# Patient Record
Sex: Female | Born: 1950 | Race: Black or African American | Hispanic: No | Marital: Single | Smoking: Never smoker
Health system: Southern US, Community
[De-identification: ages and names within clinical notes are randomized; demographics above are authoritative.]

## PROBLEM LIST (undated history)

## (undated) DIAGNOSIS — I509 Heart failure, unspecified: Secondary | ICD-10-CM

## (undated) DIAGNOSIS — E119 Type 2 diabetes mellitus without complications: Secondary | ICD-10-CM

## (undated) DIAGNOSIS — I4891 Unspecified atrial fibrillation: Secondary | ICD-10-CM

## (undated) DIAGNOSIS — I10 Essential (primary) hypertension: Secondary | ICD-10-CM

## (undated) DIAGNOSIS — I639 Cerebral infarction, unspecified: Secondary | ICD-10-CM

## (undated) DIAGNOSIS — F419 Anxiety disorder, unspecified: Secondary | ICD-10-CM

## (undated) DIAGNOSIS — K219 Gastro-esophageal reflux disease without esophagitis: Secondary | ICD-10-CM

## (undated) DIAGNOSIS — G4730 Sleep apnea, unspecified: Secondary | ICD-10-CM

## (undated) HISTORY — DX: Essential (primary) hypertension: I10

## (undated) HISTORY — DX: Anxiety disorder, unspecified: F41.9

## (undated) HISTORY — PX: OTHER SURGICAL HISTORY: SHX169

## (undated) HISTORY — DX: Sleep apnea, unspecified: G47.30

## (undated) HISTORY — PX: PARTIAL HYSTERECTOMY: SHX80

## (undated) HISTORY — DX: Type 2 diabetes mellitus without complications: E11.9

## (undated) HISTORY — DX: Gastro-esophageal reflux disease without esophagitis: K21.9

## (undated) HISTORY — DX: Cerebral infarction, unspecified: I63.9

---

## 2004-06-26 ENCOUNTER — Ambulatory Visit: Payer: Self-pay | Admitting: Family Medicine

## 2004-07-16 ENCOUNTER — Ambulatory Visit: Payer: Self-pay | Admitting: Family Medicine

## 2004-07-16 ENCOUNTER — Encounter: Admission: RE | Admit: 2004-07-16 | Discharge: 2004-07-16 | Payer: Self-pay | Admitting: Sports Medicine

## 2004-09-16 ENCOUNTER — Ambulatory Visit: Payer: Self-pay | Admitting: Family Medicine

## 2005-04-17 ENCOUNTER — Emergency Department (HOSPITAL_COMMUNITY): Admission: EM | Admit: 2005-04-17 | Discharge: 2005-04-17 | Payer: Self-pay | Admitting: *Deleted

## 2005-05-30 ENCOUNTER — Emergency Department (HOSPITAL_COMMUNITY): Admission: EM | Admit: 2005-05-30 | Discharge: 2005-05-30 | Payer: Self-pay | Admitting: Emergency Medicine

## 2005-08-02 ENCOUNTER — Ambulatory Visit: Payer: Self-pay | Admitting: Family Medicine

## 2005-10-20 ENCOUNTER — Emergency Department: Payer: Self-pay | Admitting: Emergency Medicine

## 2005-10-20 ENCOUNTER — Other Ambulatory Visit: Payer: Self-pay

## 2006-11-03 ENCOUNTER — Ambulatory Visit: Payer: Self-pay | Admitting: Family Medicine

## 2006-11-24 DIAGNOSIS — F4310 Post-traumatic stress disorder, unspecified: Secondary | ICD-10-CM | POA: Insufficient documentation

## 2006-11-24 DIAGNOSIS — J309 Allergic rhinitis, unspecified: Secondary | ICD-10-CM | POA: Insufficient documentation

## 2006-11-24 DIAGNOSIS — F329 Major depressive disorder, single episode, unspecified: Secondary | ICD-10-CM | POA: Insufficient documentation

## 2006-11-24 DIAGNOSIS — G4730 Sleep apnea, unspecified: Secondary | ICD-10-CM | POA: Insufficient documentation

## 2006-11-24 DIAGNOSIS — M1990 Unspecified osteoarthritis, unspecified site: Secondary | ICD-10-CM | POA: Insufficient documentation

## 2006-11-24 DIAGNOSIS — I10 Essential (primary) hypertension: Secondary | ICD-10-CM | POA: Insufficient documentation

## 2006-11-24 DIAGNOSIS — E785 Hyperlipidemia, unspecified: Secondary | ICD-10-CM | POA: Insufficient documentation

## 2006-11-24 DIAGNOSIS — E119 Type 2 diabetes mellitus without complications: Secondary | ICD-10-CM | POA: Insufficient documentation

## 2006-11-24 DIAGNOSIS — E669 Obesity, unspecified: Secondary | ICD-10-CM | POA: Insufficient documentation

## 2006-11-24 DIAGNOSIS — K219 Gastro-esophageal reflux disease without esophagitis: Secondary | ICD-10-CM | POA: Insufficient documentation

## 2007-01-11 ENCOUNTER — Ambulatory Visit: Payer: Self-pay | Admitting: Cardiology

## 2008-05-12 ENCOUNTER — Ambulatory Visit: Payer: Self-pay | Admitting: Family Medicine

## 2011-11-26 DEATH — deceased

## 2015-03-06 ENCOUNTER — Ambulatory Visit
Admission: RE | Admit: 2015-03-06 | Discharge: 2015-03-06 | Disposition: A | Discharge status: Home / Self Care | Payer: Medicare Other | Source: Ambulatory Visit | Attending: Primary Care | Admitting: Primary Care

## 2015-03-06 ENCOUNTER — Other Ambulatory Visit: Payer: Self-pay | Admitting: Primary Care

## 2015-03-06 DIAGNOSIS — J189 Pneumonia, unspecified organism: Secondary | ICD-10-CM

## 2015-03-06 DIAGNOSIS — I517 Cardiomegaly: Secondary | ICD-10-CM | POA: Diagnosis not present

## 2015-03-06 DIAGNOSIS — Z8701 Personal history of pneumonia (recurrent): Secondary | ICD-10-CM | POA: Diagnosis present

## 2016-02-10 DIAGNOSIS — Z8739 Personal history of other diseases of the musculoskeletal system and connective tissue: Secondary | ICD-10-CM | POA: Insufficient documentation

## 2016-02-10 DIAGNOSIS — Z794 Long term (current) use of insulin: Secondary | ICD-10-CM

## 2016-02-10 DIAGNOSIS — E119 Type 2 diabetes mellitus without complications: Secondary | ICD-10-CM | POA: Insufficient documentation

## 2016-02-10 DIAGNOSIS — Z8639 Personal history of other endocrine, nutritional and metabolic disease: Secondary | ICD-10-CM | POA: Insufficient documentation

## 2016-02-10 DIAGNOSIS — G8929 Other chronic pain: Secondary | ICD-10-CM | POA: Insufficient documentation

## 2016-02-10 DIAGNOSIS — M545 Low back pain, unspecified: Secondary | ICD-10-CM | POA: Insufficient documentation

## 2016-04-21 DIAGNOSIS — M75120 Complete rotator cuff tear or rupture of unspecified shoulder, not specified as traumatic: Secondary | ICD-10-CM | POA: Insufficient documentation

## 2017-10-25 DIAGNOSIS — Z5320 Procedure and treatment not carried out because of patient's decision for unspecified reasons: Secondary | ICD-10-CM | POA: Insufficient documentation

## 2017-11-28 ENCOUNTER — Other Ambulatory Visit: Payer: Self-pay | Admitting: Family Medicine

## 2017-11-28 ENCOUNTER — Ambulatory Visit
Admission: RE | Admit: 2017-11-28 | Discharge: 2017-11-28 | Disposition: A | Discharge status: Home / Self Care | Payer: Medicare Other | Source: Ambulatory Visit | Attending: Family Medicine | Admitting: Family Medicine

## 2017-11-28 DIAGNOSIS — R053 Chronic cough: Secondary | ICD-10-CM

## 2017-11-28 DIAGNOSIS — J90 Pleural effusion, not elsewhere classified: Secondary | ICD-10-CM | POA: Diagnosis not present

## 2017-11-28 DIAGNOSIS — R05 Cough: Secondary | ICD-10-CM

## 2017-11-28 DIAGNOSIS — R0600 Dyspnea, unspecified: Secondary | ICD-10-CM | POA: Diagnosis not present

## 2017-11-28 DIAGNOSIS — R918 Other nonspecific abnormal finding of lung field: Secondary | ICD-10-CM | POA: Diagnosis not present

## 2017-11-28 DIAGNOSIS — I517 Cardiomegaly: Secondary | ICD-10-CM | POA: Insufficient documentation

## 2018-01-03 ENCOUNTER — Ambulatory Visit (INDEPENDENT_AMBULATORY_CARE_PROVIDER_SITE_OTHER): Payer: Medicare Other | Admitting: Internal Medicine

## 2018-01-03 ENCOUNTER — Encounter: Payer: Self-pay | Admitting: Internal Medicine

## 2018-01-03 VITALS — BP 180/100 | HR 129 | Resp 16 | Ht 65.0 in | Wt 220.2 lb

## 2018-01-03 DIAGNOSIS — R918 Other nonspecific abnormal finding of lung field: Secondary | ICD-10-CM | POA: Diagnosis not present

## 2018-01-03 DIAGNOSIS — I509 Heart failure, unspecified: Secondary | ICD-10-CM

## 2018-01-03 DIAGNOSIS — G4733 Obstructive sleep apnea (adult) (pediatric): Secondary | ICD-10-CM | POA: Diagnosis not present

## 2018-01-03 DIAGNOSIS — M7550 Bursitis of unspecified shoulder: Secondary | ICD-10-CM

## 2018-01-03 DIAGNOSIS — R0602 Shortness of breath: Secondary | ICD-10-CM

## 2018-01-03 DIAGNOSIS — M19019 Primary osteoarthritis, unspecified shoulder: Secondary | ICD-10-CM | POA: Insufficient documentation

## 2018-01-03 DIAGNOSIS — M719 Bursopathy, unspecified: Secondary | ICD-10-CM | POA: Insufficient documentation

## 2018-01-03 DIAGNOSIS — Z9989 Dependence on other enabling machines and devices: Secondary | ICD-10-CM

## 2018-01-03 DIAGNOSIS — S46119A Strain of muscle, fascia and tendon of long head of biceps, unspecified arm, initial encounter: Secondary | ICD-10-CM | POA: Insufficient documentation

## 2018-01-03 DIAGNOSIS — M25579 Pain in unspecified ankle and joints of unspecified foot: Secondary | ICD-10-CM | POA: Insufficient documentation

## 2018-01-03 NOTE — Patient Instructions (Signed)
Shortness of Breath, Adult  Shortness of breath means you have trouble breathing. Your lungs are organs for breathing.  Follow these instructions at home:  Pay attention to any changes in your symptoms. Take these actions to help with your condition:  ? Do not smoke. Smoking can cause shortness of breath. If you need help to quit smoking, ask your doctor.  ? Avoid things that can make it harder to breathe, such as:  ? Mold.  ? Dust.  ? Air pollution.  ? Chemical smells.  ? Things that can cause allergy symptoms (allergens), if you have allergies.  ? Keep your living space clean and free of mold and dust.  ? Rest as needed. Slowly return to your usual activities.  ? Take over-the-counter and prescription medicines, including oxygen and inhaled medicines, only as told by your doctor.  ? Keep all follow-up visits as told by your doctor. This is important.  Contact a doctor if:  ? Your condition does not get better as soon as expected.  ? You have a hard time doing your normal activities, even after you rest.  ? You have new symptoms.  Get help right away if:  ? You have trouble breathing when you are resting.  ? You feel light-headed or you faint.  ? You have a cough that is not helped by medicines.  ? You cough up blood.  ? You have pain with breathing.  ? You have pain in your chest, arms, shoulders, or belly (abdomen).  ? You have a fever.  ? You cannot walk up stairs.  ? You cannot exercise the way you normally do.  This information is not intended to replace advice given to you by your health care provider. Make sure you discuss any questions you have with your health care provider.  Document Released: 03/01/2008 Document Revised: 09/30/2016 Document Reviewed: 09/30/2016  Elsevier Interactive Patient Education ? 2017 Elsevier Inc.

## 2018-01-03 NOTE — Progress Notes (Signed)
[facility] [address] [place], [place] [address]  Pulmonary Sleep Medicine   Office Visit Note  Patient Name: Leah Ritter DOB: 1951-07-14 MRN [id number]  Date of Service: 01/03/2018  Complaints/HPI:  Patient complains with multiple problems.  She states that she has had the cough and shortness of breath going on several months.  She has been seen by her primary care physician and had a chest x-ray done back in March which showed some pulmonary infiltrates which was concerning for either pulmonary edema or for atypical infection.  She does have a history of sleep apnea and states that she is using her CPAP device.  She has not had a full cardiac workup done.  She was actually referred for workup  But she did not go.  She states that now she is willing to do whatever needs to be done to try to figure out why she is having somewhat shortness of breath.  She states that she does have allergy symptoms she also does have cough.  She says she brings up mucus on occasion.  ROS  General: (-) fever, (-) chills, (-) night sweats, (-) weakness Skin: (-) rashes, (-) itching,. Eyes: (-) visual changes, (-) redness, (-) itching. Nose and Sinuses: (-) nasal stuffiness or itchiness, (-) postnasal drip, (-) nosebleeds, (-) sinus trouble. Mouth and Throat: (-) sore throat, (-) hoarseness. Neck: (-) swollen glands, (-) enlarged thyroid, (-) neck pain. Respiratory: + cough, (-) bloody sputum, + shortness of breath, + wheezing. Cardiovascular: - ankle swelling, (-) chest pain. Lymphatic: (-) lymph node enlargement. Neurologic: (-) numbness, (-) tingling. Psychiatric: (-) anxiety, (-) depression   Current Medication: Outpatient Encounter Medications as of 01/03/2018  Medication Sig  . glipiZIDE (GLUCOTROL) 10 MG tablet Take 10 mg by mouth daily before breakfast.  . losartan (COZAAR) 100 MG tablet Take 100 mg by mouth daily.  . metFORMIN (GLUCOPHAGE) 500 MG tablet Take by mouth 2 (two)  times daily with a meal.  . sertraline (ZOLOFT) 50 MG tablet sertraline 50 mg tablet  . Travoprost, BAK Free, (TRAVATAN Z) 0.004 % SOLN ophthalmic solution   . Vitamin D, Ergocalciferol, 2000 units CAPS Take by mouth.  . baclofen (LIORESAL) 10 MG tablet baclofen 10 mg tablet  . CONTOUR NEXT TEST test strip CHECK BLOOD SUGAR TID UTD  . meloxicam (MOBIC) 15 MG tablet meloxicam 15 mg tablet  . VENTOLIN HFA 108 (90 Base) MCG/ACT inhaler INL 2 INHALATIONS ITL Q 4 H PRF WHZ   No facility-administered encounter medications on file as of 01/03/2018.     Surgical History: Past Surgical History:  Procedure Laterality Date  . PARTIAL HYSTERECTOMY    . tubligation      Medical History: Past Medical History:  Diagnosis Date  . Anxiety   . Diabetes (HCC)   . GERD (gastroesophageal reflux disease)   . Hypertension   . Sleep apnea     Family History: Family History  Problem Relation Age of Onset  . Breast cancer Mother   . Stroke Father   . Hypertension Father   . Kidney disease Brother   . Cancer Brother     Social History: Social History   Socioeconomic History  . Marital status: Single    Spouse name: Not on file  . Number of children: Not on file  . Years of education: Not on file  . Highest education level: Not on file  Occupational History  . Not on file  Social Needs  . Financial resource strain:  Not on file  . Food insecurity:    Worry: Not on file    Inability: Not on file  . Transportation needs:    Medical: Not on file    Non-medical: Not on file  Tobacco Use  . Smoking status: Never Smoker  . Smokeless tobacco: Never Used  Substance and Sexual Activity  . Alcohol use: Not Currently  . Drug use: Never  . Sexual activity: Not on file  Lifestyle  . Physical activity:    Days per week: Not on file    Minutes per session: Not on file  . Stress: Not on file  Relationships  . Social connections:    Talks on phone: Not on file    Gets together: Not on file     Attends religious service: Not on file    Active member of club or organization: Not on file    Attends meetings of clubs or organizations: Not on file    Relationship status: Not on file  . Intimate partner violence:    Fear of current or ex partner: Not on file    Emotionally abused: Not on file    Physically abused: Not on file    Forced sexual activity: Not on file  Other Topics Concern  . Not on file  Social History Narrative  . Not on file    Vital Signs: Blood pressure (!) 200/110, pulse (!) 129, resp. rate 16, height 5\' 5"  (1.651 m), weight 220 lb 3.2 oz (99.9 kg), SpO2 92 %.  Examination: General Appearance: The patient is well-developed, well-nourished, and in no distress. Skin: Gross inspection of skin unremarkable. Head: normocephalic, no gross deformities. Eyes: no gross deformities noted. ENT: ears appear grossly normal no exudates. Neck: Supple. No thyromegaly. No LAD. Respiratory: scattered rhonchi. Cardiovascular: Normal S1 and S2 without murmur or rub. Extremities: No cyanosis. pulses are equal. Neurologic: Alert and oriented. No involuntary movements.  LABS: No results found for this or any previous visit (from the past 2160 hour(s)).  Radiology: Dg Chest 2 View  Result Date: 11/29/2017 CLINICAL DATA:  Shortness of breath with exertion. Tachycardia. Cough for 4 weeks. EXAM: CHEST  2 VIEW COMPARISON:  Chest radiograph 03/06/2015. FINDINGS: Cardiomegaly. Bilateral interstitial pulmonary opacities. Small bilateral pleural effusions. No pneumothorax. Thoracic spine degenerative changes. IMPRESSION: Cardiomegaly with interstitial opacities favored to represent edema. Atypical infection could appear similar. Small bilateral pleural effusions Electronically Signed   By: Annia Belt M.D.   On: 11/29/2017 08:12    No results found.  No results found.    Assessment and Plan: Patient Active Problem List   Diagnosis Date Noted  . Pain in joint involving ankle  and foot 01/03/2018  . Localized, primary osteoarthritis of shoulder region 01/03/2018  . Rupture of tendon of biceps, long head 01/03/2018  . Disorder of bursae of shoulder region 01/03/2018  . Screening declined by patient 10/25/2017  . Full thickness rotator cuff tear 04/21/2016  . Chronic bilateral low back pain without sciatica 02/10/2016  . H/O rotator cuff tear 02/10/2016  . History of vitamin D deficiency 02/10/2016  . Type 2 diabetes mellitus without complication, with long-term current use of insulin (HCC) 02/10/2016  . DIABETES MELLITUS, II, COMPLICATIONS 11/24/2006  . HYPERLIPIDEMIA 11/24/2006  . OBESITY, NOS 11/24/2006  . POST TRAUMATIC STRESS DISORDER 11/24/2006  . DEPRESSIVE DISORDER, NOS 11/24/2006  . HYPERTENSION, BENIGN SYSTEMIC 11/24/2006  . RHINITIS, ALLERGIC 11/24/2006  . GASTROESOPHAGEAL REFLUX, NO ESOPHAGITIS 11/24/2006  . OSTEOARTHRITIS, MULTI SITES 11/24/2006  .  APNEA, SLEEP 11/24/2006    1. SOB  Unclear etiology she has history of sleep apnea she does not smoke she also had an abnormal chest x-ray no early March.  It is unclear if this was infectious at the time or if it was truly pulmonary edema.  When I had suggested is a complete workup to include a CT scan of the chest and also get a CT angiogram to make sure she does not have any chronic pulmonary emboli although suspicion is low she should also have an echocardiogram to assess the left ventricle as well as the right-sided pressures and pulmonary pressures. 2. ?CHF  She needs a cardiology evaluation she already has had appointments made with cardiology at [facility] I have stressed to her the importance of keeping these appointments in seeing the consultants in order for them to be able to help 3. Pulmonary infiltrates I have ordered a follow-up CT scan chest at this time.  Will follow up after this is done 4. OSA she needs to be compliant with her CPAP device once again stressed importance of ongoing  compliance 5. Morbid Obesity also stressed to importance of weight loss through diet and exercise 6.  hypertension she will follow up with her primary care  General Counseling: I have discussed the findings of the evaluation and examination with Scarlette Calico.  I have also discussed any further diagnostic evaluation thatmay be needed or ordered today. Sherman verbalizes understanding of the findings of todays visit. We also reviewed her medications today and discussed drug interactions and side effects including but not limited excessive drowsiness and altered mental states. We also discussed that there is always a risk not just to her but also people around her. she has been encouraged to call the office with any questions or concerns that should arise related to todays visit.    Time spent:  I have personally obtained a history, examined the patient, evaluated laboratory and imaging results, formulated the assessment and plan and placed orders.    Yevonne Pax, MD [facility] Pulmonary and Critical Care Sleep medicine

## 2018-01-04 ENCOUNTER — Ambulatory Visit (INDEPENDENT_AMBULATORY_CARE_PROVIDER_SITE_OTHER): Payer: Medicare Other | Admitting: Internal Medicine

## 2018-01-04 DIAGNOSIS — R0602 Shortness of breath: Secondary | ICD-10-CM | POA: Diagnosis not present

## 2018-01-09 NOTE — Procedures (Signed)
[facility] [address] [place], [address]  DATE OF SERVICE:   January 04, 2018  Complete Pulmonary Function Testing Interpretation:  FINDINGS:   forced vital capacity is severely decreased the FEV1 is 1.07 L which is 54% predicted and is moderately decreased.  The FEV1 FVC ratio is normal.  Total lung capacity by body box is moderately decreased residual volumes decrease residual volume total lung capacity ratio was increased the FRC is decreased  DLCO is mildly decrease and normal when corrected for alveolar volume  IMPRESSION:   this pulmonary function is consistent with moderate obstructive lung disease and Body box measurements may indicate a moderate restrictive component also. DLCO is mildly decreased and normal when corrected for alveolar volume Clinical correlation is recommended  Yevonne Pax, MD [facility] Pulmonary Critical Care Medicine Sleep Medicine

## 2018-01-10 ENCOUNTER — Telehealth: Payer: Self-pay

## 2018-01-10 NOTE — Telephone Encounter (Signed)
Patient scheduled for Ct Chest on 01/13/18 @ 11:00 [place]/ Tat  *pt has been advised*

## 2018-01-12 ENCOUNTER — Other Ambulatory Visit: Payer: Self-pay | Admitting: Nurse Practitioner

## 2018-01-12 ENCOUNTER — Telehealth: Payer: Self-pay

## 2018-01-12 DIAGNOSIS — I10 Essential (primary) hypertension: Secondary | ICD-10-CM

## 2018-01-12 NOTE — Progress Notes (Signed)
BMP ordered to get creatinine level for CT scan scheduled 01/13/2018.

## 2018-01-12 NOTE — Telephone Encounter (Signed)
BMP ordered to get creatinine level for CT scan scheduled 01/13/2018.  Order was just put into epic.

## 2018-01-13 ENCOUNTER — Other Ambulatory Visit: Payer: Self-pay | Admitting: Internal Medicine

## 2018-01-13 ENCOUNTER — Other Ambulatory Visit
Admission: RE | Admit: 2018-01-13 | Discharge: 2018-01-13 | Disposition: A | Discharge status: Home / Self Care | Payer: Medicare Other | Source: Ambulatory Visit | Attending: Internal Medicine | Admitting: Internal Medicine

## 2018-01-13 ENCOUNTER — Ambulatory Visit
Admission: RE | Admit: 2018-01-13 | Discharge: 2018-01-13 | Disposition: A | Discharge status: Home / Self Care | Payer: Medicare Other | Source: Ambulatory Visit | Attending: Internal Medicine | Admitting: Internal Medicine

## 2018-01-13 DIAGNOSIS — I10 Essential (primary) hypertension: Secondary | ICD-10-CM | POA: Insufficient documentation

## 2018-01-13 DIAGNOSIS — J90 Pleural effusion, not elsewhere classified: Secondary | ICD-10-CM | POA: Diagnosis not present

## 2018-01-13 DIAGNOSIS — R911 Solitary pulmonary nodule: Secondary | ICD-10-CM | POA: Insufficient documentation

## 2018-01-13 DIAGNOSIS — J9811 Atelectasis: Secondary | ICD-10-CM | POA: Diagnosis not present

## 2018-01-13 DIAGNOSIS — R0602 Shortness of breath: Secondary | ICD-10-CM | POA: Insufficient documentation

## 2018-01-13 LAB — BASIC METABOLIC PANEL
Anion gap: 11 (ref 5–15)
BUN: 16 mg/dL (ref 6–20)
CO2: 26 mmol/L (ref 22–32)
Calcium: 9.3 mg/dL (ref 8.9–10.3)
Chloride: 105 mmol/L (ref 101–111)
Creatinine, Ser: 0.85 mg/dL (ref 0.44–1.00)
GFR calc Af Amer: >60 mL/min (ref >60)
GFR calc non Af Amer: >60 mL/min (ref >60)
Glucose, Bld: 223 mg/dL — ABNORMAL HIGH (ref 65–99)
Potassium: 3.5 mmol/L (ref 3.5–5.1)
Sodium: 142 mmol/L (ref 135–145)

## 2018-01-13 MED ORDER — IOHEXOL 300 MG/ML  SOLN
100.0000 mL | Freq: Once | INTRAMUSCULAR | Status: AC | PRN
  Administered 2018-01-13: 100 mL via INTRAVENOUS

## 2018-02-10 ENCOUNTER — Other Ambulatory Visit: Payer: Self-pay

## 2018-02-14 ENCOUNTER — Ambulatory Visit: Payer: Self-pay | Admitting: Internal Medicine

## 2018-02-15 ENCOUNTER — Telehealth: Payer: Self-pay | Admitting: Internal Medicine

## 2018-02-15 NOTE — Telephone Encounter (Signed)
Faxed [facility] cpap supply order. Beth

## 2018-03-13 ENCOUNTER — Ambulatory Visit (INDEPENDENT_AMBULATORY_CARE_PROVIDER_SITE_OTHER): Payer: Medicare Other | Admitting: Internal Medicine

## 2018-03-13 ENCOUNTER — Encounter: Payer: Self-pay | Admitting: Internal Medicine

## 2018-03-13 VITALS — BP 130/78 | HR 76 | Resp 16 | Ht 65.0 in | Wt 198.0 lb

## 2018-03-13 DIAGNOSIS — G4733 Obstructive sleep apnea (adult) (pediatric): Secondary | ICD-10-CM | POA: Diagnosis not present

## 2018-03-13 DIAGNOSIS — I499 Cardiac arrhythmia, unspecified: Secondary | ICD-10-CM

## 2018-03-13 DIAGNOSIS — Z9989 Dependence on other enabling machines and devices: Secondary | ICD-10-CM

## 2018-03-13 DIAGNOSIS — I639 Cerebral infarction, unspecified: Secondary | ICD-10-CM

## 2018-03-13 DIAGNOSIS — J449 Chronic obstructive pulmonary disease, unspecified: Secondary | ICD-10-CM | POA: Diagnosis not present

## 2018-03-13 MED ORDER — FLUTICASONE-UMECLIDIN-VILANT 100-62.5-25 MCG/INH IN AEPB: 1.0000 | INHALATION_SPRAY | Freq: Every day | RESPIRATORY_TRACT | 4 refills | Status: DC

## 2018-03-13 NOTE — Patient Instructions (Signed)

## 2018-03-13 NOTE — Progress Notes (Signed)
[facility] [place], [place] [address]  Pulmonary Sleep Medicine   Office Visit Note  Patient Name: Leah Ritter DOB: [birth year] MRN [id number]  Date of Service: 03/13/2018  Complaints/HPI:   Patient is doing fairly well.  Has had increasing shortness breath cough and some congestion.  She had a spirometry done which is consistent with moderate COPD.  Outlook daughter order spirometry which was actually been him as.  She does not smoke but has been exposed to secondhand smoke pretty significantly.  Denies any any chest pain no palpitations at this time.  No fevers or chills are noted.  Her sleep has been doing fairly well she is supposed to be using CPAP discuss the importance of come compliance.  As far as her cardiac status is concerned she is following with Cardiology for irregular heartbeat  ROS  General: (-) fever, (-) chills, (-) night sweats, (-) weakness Skin: (-) rashes, (-) itching,. Eyes: (-) visual changes, (-) redness, (-) itching. Nose and Sinuses: (-) nasal stuffiness or itchiness, (-) postnasal drip, (-) nosebleeds, (-) sinus trouble. Mouth and Throat: (-) sore throat, (-) hoarseness. Neck: (-) swollen glands, (-) enlarged thyroid, (-) neck pain. Respiratory: - cough, (-) bloody sputum, + shortness of breath, - wheezing. Cardiovascular: - ankle swelling, (-) chest pain. Lymphatic: (-) lymph node enlargement. Neurologic: (-) numbness, (-) tingling. Psychiatric: (-) anxiety, (-) depression   Current Medication: Outpatient Encounter Medications as of 03/13/2018  Medication Sig  . aspirin 81 MG chewable tablet Chew by mouth.  . carvedilol (COREG) 25 MG tablet Take by mouth.  . Cholecalciferol 1000 units tablet Take 1,000 Units by mouth daily.  . clopidogrel (PLAVIX) 75 MG tablet Take 75 mg by mouth daily.  . cyclobenzaprine (FLEXERIL) 5 MG tablet Take 5 mg by mouth 3 (three) times daily as needed for muscle spasms.  Marland Kitchen glipiZIDE (GLUCOTROL)  10 MG tablet Take 10 mg by mouth daily before breakfast.  . lisinopril (PRINIVIL,ZESTRIL) 5 MG tablet Take by mouth.  . losartan (COZAAR) 100 MG tablet Take 100 mg by mouth daily.  . rosuvastatin (CRESTOR) 5 MG tablet Take 5 mg by mouth daily.  . sertraline (ZOLOFT) 50 MG tablet sertraline 50 mg tablet  . baclofen (LIORESAL) 10 MG tablet baclofen 10 mg tablet  . CONTOUR NEXT TEST test strip CHECK BLOOD SUGAR TID UTD  . meloxicam (MOBIC) 15 MG tablet meloxicam 15 mg tablet  . metFORMIN (GLUCOPHAGE) 500 MG tablet Take by mouth 2 (two) times daily with a meal.  . Travoprost, BAK Free, (TRAVATAN Z) 0.004 % SOLN ophthalmic solution   . VENTOLIN HFA 108 (90 Base) MCG/ACT inhaler INL 2 INHALATIONS ITL Q 4 H PRF WHZ  . Vitamin D, Ergocalciferol, 2000 units CAPS Take by mouth.   No facility-administered encounter medications on file as of 03/13/2018.     Surgical History: Past Surgical History:  Procedure Laterality Date  . PARTIAL HYSTERECTOMY    . tubligation      Medical History: Past Medical History:  Diagnosis Date  . Anxiety   . Diabetes ([place])   . GERD (gastroesophageal reflux disease)   . Hypertension   . Sleep apnea   . Stroke [facility])     Family History: Family History  Problem Relation Age of Onset  . Breast cancer Mother   . Stroke Father   . Hypertension Father   . Kidney disease Brother   . Cancer Brother     Social History: Social History   Socioeconomic  History  . Marital status: Single    Spouse name: Not on file  . Number of children: Not on file  . Years of education: Not on file  . Highest education level: Not on file  Occupational History  . Not on file  Social Needs  . Financial resource strain: Not on file  . Food insecurity:    Worry: Not on file    Inability: Not on file  . Transportation needs:    Medical: Not on file    Non-medical: Not on file  Tobacco Use  . Smoking status: Never Smoker  . Smokeless tobacco: Never Used  Substance and  Sexual Activity  . Alcohol use: Not Currently  . Drug use: Never  . Sexual activity: Not on file  Lifestyle  . Physical activity:    Days per week: Not on file    Minutes per session: Not on file  . Stress: Not on file  Relationships  . Social connections:    Talks on phone: Not on file    Gets together: Not on file    Attends religious service: Not on file    Active member of club or organization: Not on file    Attends meetings of clubs or organizations: Not on file    Relationship status: Not on file  . Intimate partner violence:    Fear of current or ex partner: Not on file    Emotionally abused: Not on file    Physically abused: Not on file    Forced sexual activity: Not on file  Other Topics Concern  . Not on file  Social History Narrative  . Not on file    Vital Signs: Blood pressure 130/78, pulse 76, resp. rate 16, height '5\' 5"'$  (1.651 m), weight 198 lb (89.8 kg), SpO2 93 %.  Examination: General Appearance: The patient is well-developed, well-nourished, and in no distress. Skin: Gross inspection of skin unremarkable. Head: normocephalic, no gross deformities. Eyes: no gross deformities noted. ENT: ears appear grossly normal no exudates. Neck: Supple. No thyromegaly. No LAD. Respiratory: few rhonchi noted. Cardiovascular: Normal S1 and S2 without murmur or rub. Extremities: No cyanosis. pulses are equal. Neurologic: Alert and oriented. No involuntary movements.  LABS: Recent Results (from the past 2160 hour(s))  Basic metabolic panel     Status: Abnormal   Collection Time: 01/13/18  9:58 AM  Result Value Ref Range   Sodium 142 135 - 145 mmol/L   Potassium 3.5 3.5 - 5.1 mmol/L   Chloride 105 101 - 111 mmol/L   CO2 26 22 - 32 mmol/L   Glucose, Bld 223 (H) 65 - 99 mg/dL   BUN 16 6 - 20 mg/dL   Creatinine, Ser 0.85 0.44 - 1.00 mg/dL   Calcium 9.3 8.9 - 10.3 mg/dL   GFR calc non Af Amer >60 >60 mL/min   GFR calc Af Amer >60 >60 mL/min    Comment:  (NOTE) The eGFR has been calculated using the CKD EPI equation. This calculation has not been validated in all clinical situations. eGFR's persistently <60 mL/min signify possible Chronic Kidney Disease.    Anion gap 11 5 - 15    Comment: Performed at [facility], [address], [place], [place] [address]    Radiology: Ct Angio Chest W/cm &/or Wo Cm  Result Date: 01/13/2018 CLINICAL DATA:  Shortness of breath with exertion. EXAM: CT ANGIOGRAPHY CHEST WITH CONTRAST TECHNIQUE: Multidetector CT imaging of the chest was performed using the standard  protocol during bolus administration of intravenous contrast. Multiplanar CT image reconstructions and MIPs were obtained to evaluate the vascular anatomy. CONTRAST:  12m OMNIPAQUE IOHEXOL 300 MG/ML  SOLN COMPARISON:  None. FINDINGS: Cardiovascular: Satisfactory opacification of the pulmonary arteries to the segmental level. No evidence of pulmonary embolism. Mild cardiomegaly is noted. No pericardial effusion. There is no evidence of thoracic aortic dissection or aneurysm. Mediastinum/Nodes: No enlarged mediastinal, hilar, or axillary lymph nodes. Thyroid gland, trachea, and esophagus demonstrate no significant findings. Lungs/Pleura: No pneumothorax is noted. Mild right pleural effusion is noted with adjacent right basilar subsegmental atelectasis. Minimal left pleural effusion is noted. 7 mm rounded density is noted in right lung base best seen on image number 52 of series 7. Upper Abdomen: No acute abnormality. Musculoskeletal: No chest wall abnormality. No acute or significant osseous findings. Review of the MIP images confirms the above findings. IMPRESSION: No definite evidence of pulmonary embolus. Mild right pleural effusion is noted with adjacent right basilar subsegmental atelectasis. 7 mm nodule seen in right lung base. Non-contrast chest CT at 6-12 months is recommended. If the nodule is stable at time of repeat CT, then future CT at  18-24 months (from today's scan) is considered optional for low-risk patients, but is recommended for high-risk patients. This recommendation follows the consensus statement: Guidelines for Management of Incidental Pulmonary Nodules Detected on CT Images: From the Fleischner Society 2017; Radiology 2017; 284:228-243. Electronically Signed   By: JMarijo Conception M.D.   On: 01/13/2018 11:36    No results found.  No results found.    Assessment and Plan: Patient Active Problem List   Diagnosis Date Noted  . Pain in joint involving ankle and foot 01/03/2018  . Localized, primary osteoarthritis of shoulder region 01/03/2018  . Rupture of tendon of biceps, long head 01/03/2018  . Disorder of bursae of shoulder region 01/03/2018  . Screening declined by patient 10/25/2017  . Full thickness rotator cuff tear 04/21/2016  . Chronic bilateral low back pain without sciatica 02/10/2016  . H/O rotator cuff tear 02/10/2016  . History of vitamin D deficiency 02/10/2016  . Type 2 diabetes mellitus without complication, with long-term current use of insulin ([place] 02/10/2016  . DIABETES MELLITUS, II, COMPLICATIONS 095/05/3266 . HYPERLIPIDEMIA 11/24/2006  . OBESITY, NOS 11/24/2006  . POST TRAUMATIC STRESS DISORDER 11/24/2006  . DEPRESSIVE DISORDER, NOS 11/24/2006  . HYPERTENSION, BENIGN SYSTEMIC 11/24/2006  . RHINITIS, ALLERGIC 11/24/2006  . GASTROESOPHAGEAL REFLUX, NO ESOPHAGITIS 11/24/2006  . OSTEOARTHRITIS, MULTI SITES 11/24/2006  . APNEA, SLEEP 11/24/2006    1. COPD she has moderate COPD based on her PFT at this time. Back in 2005 her spiro was normal. Patient right now is not on any inhalers. She needs to go on inhalers will give her a script for trelegy 2. OSA needs to use CPAP as prescribed 3. Irregular heart beat she does have a appt with cardiology to assess 4. Stroke has had a recent stroke she will follow with her primary for this  General Counseling: I have discussed the findings of  the evaluation and examination with FJoaquim Lai  I have also discussed any further diagnostic evaluation thatmay be needed or ordered today. FPrarthanaverbalizes understanding of the findings of todays visit. We also reviewed her medications today and discussed drug interactions and side effects including but not limited excessive drowsiness and altered mental states. We also discussed that there is always a risk not just to her but also people around her. she has been encouraged  to call the office with any questions or concerns that should arise related to todays visit.    Time spent: 86mn  I have personally obtained a history, examined the patient, evaluated laboratory and imaging results, formulated the assessment and plan and placed orders.    SAllyne Gee MD [facility] Sleep medicine

## 2018-04-28 ENCOUNTER — Telehealth: Payer: Self-pay | Admitting: Internal Medicine

## 2018-04-28 NOTE — Telephone Encounter (Signed)
I have left a message with [facility] therapy regarding patient cpap supply order, per patient as of 04/27/18 she has not received her supplies from May and June when our office faxed in RX and CMN, I am waiting on call back to see when patient will get supplies. Beth

## 2018-09-04 ENCOUNTER — Ambulatory Visit: Payer: Self-pay | Admitting: Internal Medicine

## 2018-09-05 ENCOUNTER — Encounter: Payer: Self-pay | Admitting: Internal Medicine

## 2018-09-05 ENCOUNTER — Ambulatory Visit (INDEPENDENT_AMBULATORY_CARE_PROVIDER_SITE_OTHER): Payer: Medicare Other | Admitting: Internal Medicine

## 2018-09-05 VITALS — BP 161/102 | HR 92 | Resp 16 | Ht 64.0 in | Wt 221.0 lb

## 2018-09-05 DIAGNOSIS — I10 Essential (primary) hypertension: Secondary | ICD-10-CM

## 2018-09-05 DIAGNOSIS — R911 Solitary pulmonary nodule: Secondary | ICD-10-CM

## 2018-09-05 DIAGNOSIS — J449 Chronic obstructive pulmonary disease, unspecified: Secondary | ICD-10-CM | POA: Diagnosis not present

## 2018-09-05 DIAGNOSIS — G4733 Obstructive sleep apnea (adult) (pediatric): Secondary | ICD-10-CM | POA: Diagnosis not present

## 2018-09-05 DIAGNOSIS — Z9989 Dependence on other enabling machines and devices: Secondary | ICD-10-CM

## 2018-09-05 DIAGNOSIS — R918 Other nonspecific abnormal finding of lung field: Secondary | ICD-10-CM | POA: Diagnosis not present

## 2018-09-05 DIAGNOSIS — R0602 Shortness of breath: Secondary | ICD-10-CM

## 2018-09-05 DIAGNOSIS — I639 Cerebral infarction, unspecified: Secondary | ICD-10-CM

## 2018-09-05 NOTE — Patient Instructions (Signed)

## 2018-09-05 NOTE — Progress Notes (Signed)
[facility] [address] [place], [place] [address]  Pulmonary Sleep Medicine   Office Visit Note  Patient Name: Leah Ritter DOB: 03-16-1951 MRN [id number]  Date of Service: 09/05/2018  Complaints/HPI: Pt is here for follow up. She reports continued cough.  She denies productivity.  She reports she wears her CPAP regularly.  She does reports some nights she will fall asleep and forget to put it on.  She is cleaning her machine regularly.  She is switching out her filter and tubing as scheduled.  She denies any issues with mask seal.  She does not report any chest pain, shortness of breath, sinus pressure or hemoptysis.  In April 2019 the patient had a CT angiogram to evaluate for pulmonary embolism.  The scan was negative for PE however a 7 mm nodule was seen in the right lung base.  It was recommended at that time that a follow-up scan be done in 6 to 12 months.  We will order that CT for surveillance at this visit.  ROS  General: (-) fever, (-) chills, (-) night sweats, (-) weakness Skin: (-) rashes, (-) itching,. Eyes: (-) visual changes, (-) redness, (-) itching. Nose and Sinuses: (-) nasal stuffiness or itchiness, (-) postnasal drip, (-) nosebleeds, (-) sinus trouble. Mouth and Throat: (-) sore throat, (-) hoarseness. Neck: (-) swollen glands, (-) enlarged thyroid, (-) neck pain. Respiratory: + cough, (-) bloody sputum, - shortness of breath, - wheezing. Cardiovascular: - ankle swelling, (-) chest pain. Lymphatic: (-) lymph node enlargement. Neurologic: (-) numbness, (-) tingling. Psychiatric: (-) anxiety, (-) depression   Current Medication: Outpatient Encounter Medications as of 09/05/2018  Medication Sig  . amLODipine (NORVASC) 5 MG tablet amlodipine 5 mg tablet  . apixaban (ELIQUIS) 5 MG TABS tablet Take by mouth.  Marland Kitchen atorvastatin (LIPITOR) 10 MG tablet atorvastatin 10 mg tablet  . baclofen (LIORESAL) 10 MG tablet baclofen 10 mg tablet  . carvedilol  (COREG) 25 MG tablet Take by mouth.  . Cholecalciferol 1000 units tablet Take 1,000 Units by mouth daily.  . clopidogrel (PLAVIX) 75 MG tablet Take 75 mg by mouth daily.  . CONTOUR NEXT TEST test strip CHECK BLOOD SUGAR TID UTD  . cyclobenzaprine (FLEXERIL) 5 MG tablet Take 5 mg by mouth 3 (three) times daily as needed for muscle spasms.  . fluticasone (FLONASE) 50 MCG/ACT nasal spray   . Fluticasone-Umeclidin-Vilant (TRELEGY ELLIPTA) 100-62.5-25 MCG/INH AEPB Inhale 1 puff into the lungs daily.  . furosemide (LASIX) 20 MG tablet Take by mouth.  Marland Kitchen glipiZIDE (GLUCOTROL) 10 MG tablet Take 10 mg by mouth daily before breakfast.  . lisinopril (PRINIVIL,ZESTRIL) 5 MG tablet Take by mouth.  . losartan (COZAAR) 100 MG tablet Take 100 mg by mouth daily.  . meloxicam (MOBIC) 15 MG tablet meloxicam 15 mg tablet  . metFORMIN (GLUCOPHAGE) 500 MG tablet Take by mouth 2 (two) times daily with a meal.  . rosuvastatin (CRESTOR) 5 MG tablet Take 5 mg by mouth daily.  . sertraline (ZOLOFT) 50 MG tablet sertraline 50 mg tablet  . Travoprost, BAK Free, (TRAVATAN Z) 0.004 % SOLN ophthalmic solution   . VENTOLIN HFA 108 (90 Base) MCG/ACT inhaler INL 2 INHALATIONS ITL Q 4 H PRF WHZ  . Vitamin D, Ergocalciferol, 2000 units CAPS Take by mouth.  . [DISCONTINUED] glipiZIDE (GLUCOTROL) 10 MG tablet glipizide 10 mg tablet  . [DISCONTINUED] aspirin 81 MG chewable tablet Chew by mouth.  . [DISCONTINUED] hydrochlorothiazide (HYDRODIURIL) 25 MG tablet hydrochlorothiazide 25 mg tablet  No facility-administered encounter medications on file as of 09/05/2018.     Surgical History: Past Surgical History:  Procedure Laterality Date  . PARTIAL HYSTERECTOMY    . tubligation      Medical History: Past Medical History:  Diagnosis Date  . Anxiety   . Diabetes (HCC)   . GERD (gastroesophageal reflux disease)   . Hypertension   . Sleep apnea   . Stroke [facility])     Family History: Family History  Problem Relation Age  of Onset  . Breast cancer Mother   . Stroke Father   . Hypertension Father   . Kidney disease Brother   . Cancer Brother     Social History: Social History   Socioeconomic History  . Marital status: Single    Spouse name: Not on file  . Number of children: Not on file  . Years of education: Not on file  . Highest education level: Not on file  Occupational History  . Not on file  Social Needs  . Financial resource strain: Not on file  . Food insecurity:    Worry: Not on file    Inability: Not on file  . Transportation needs:    Medical: Not on file    Non-medical: Not on file  Tobacco Use  . Smoking status: Never Smoker  . Smokeless tobacco: Never Used  Substance and Sexual Activity  . Alcohol use: Not Currently  . Drug use: Never  . Sexual activity: Not on file  Lifestyle  . Physical activity:    Days per week: Not on file    Minutes per session: Not on file  . Stress: Not on file  Relationships  . Social connections:    Talks on phone: Not on file    Gets together: Not on file    Attends religious service: Not on file    Active member of club or organization: Not on file    Attends meetings of clubs or organizations: Not on file    Relationship status: Not on file  . Intimate partner violence:    Fear of current or ex partner: Not on file    Emotionally abused: Not on file    Physically abused: Not on file    Forced sexual activity: Not on file  Other Topics Concern  . Not on file  Social History Narrative  . Not on file    Vital Signs: Blood pressure (!) 161/102, pulse 92, resp. rate 16, height 5\' 4"  (1.626 m), weight 221 lb (100.2 kg), SpO2 94 %.  Examination: General Appearance: The patient is well-developed, well-nourished, and in no distress. Skin: Gross inspection of skin unremarkable. Head: normocephalic, no gross deformities. Eyes: no gross deformities noted. ENT: ears appear grossly normal no exudates. Neck: Supple. No thyromegaly. No  LAD. Respiratory: clear bilateraly. Cardiovascular: Normal S1 and S2 without murmur or rub. Extremities: No cyanosis. pulses are equal. Neurologic: Alert and oriented. No involuntary movements.  LABS: No results found for this or any previous visit (from the past 2160 hour(s)).  Radiology: Ct Angio Chest W/cm &/or Wo Cm  Result Date: 01/13/2018 CLINICAL DATA:  Shortness of breath with exertion. EXAM: CT ANGIOGRAPHY CHEST WITH CONTRAST TECHNIQUE: Multidetector CT imaging of the chest was performed using the standard protocol during bolus administration of intravenous contrast. Multiplanar CT image reconstructions and MIPs were obtained to evaluate the vascular anatomy. CONTRAST:  OMNIPAQUE IOHEXOL 300 MG/ML  SOLN COMPARISON:  None. FINDINGS: Cardiovascular: Satisfactory opacification of the pulmonary arteries  to the segmental level. No evidence of pulmonary embolism. Mild cardiomegaly is noted. No pericardial effusion. There is no evidence of thoracic aortic dissection or aneurysm. Mediastinum/Nodes: No enlarged mediastinal, hilar, or axillary lymph nodes. Thyroid gland, trachea, and esophagus demonstrate no significant findings. Lungs/Pleura: No pneumothorax is noted. Mild right pleural effusion is noted with adjacent right basilar subsegmental atelectasis. Minimal left pleural effusion is noted. 7 mm rounded density is noted in right lung base best seen on image number 52 of series 7. Upper Abdomen: No acute abnormality. Musculoskeletal: No chest wall abnormality. No acute or significant osseous findings. Review of the MIP images confirms the above findings. IMPRESSION: No definite evidence of pulmonary embolus. Mild right pleural effusion is noted with adjacent right basilar subsegmental atelectasis. 7 mm nodule seen in right lung base. Non-contrast chest CT at 6-12 months is recommended. If the nodule is stable at time of repeat CT, then future CT at 18-24 months (from today's scan) is  considered optional for low-risk patients, but is recommended for high-risk patients. This recommendation follows the consensus statement: Guidelines for Management of Incidental Pulmonary Nodules Detected on CT Images: From the Fleischner Society 2017; Radiology 2017; 284:228-243. Electronically Signed   By: Lupita Raider, M.D.   On: 01/13/2018 11:36    No results found.  No results found.    Assessment and Plan: Patient Active Problem List   Diagnosis Date Noted  . Pain in joint involving ankle and foot 01/03/2018  . Localized, primary osteoarthritis of shoulder region 01/03/2018  . Rupture of tendon of biceps, long head 01/03/2018  . Disorder of bursae of shoulder region 01/03/2018  . Screening declined by patient 10/25/2017  . Full thickness rotator cuff tear 04/21/2016  . Chronic bilateral low back pain without sciatica 02/10/2016  . H/O rotator cuff tear 02/10/2016  . History of vitamin D deficiency 02/10/2016  . Type 2 diabetes mellitus without complication, with long-term current use of insulin (HCC) 02/10/2016  . DIABETES MELLITUS, II, COMPLICATIONS 11/24/2006  . HYPERLIPIDEMIA 11/24/2006  . OBESITY, NOS 11/24/2006  . POST TRAUMATIC STRESS DISORDER 11/24/2006  . DEPRESSIVE DISORDER, NOS 11/24/2006  . HYPERTENSION, BENIGN SYSTEMIC 11/24/2006  . RHINITIS, ALLERGIC 11/24/2006  . GASTROESOPHAGEAL REFLUX, NO ESOPHAGITIS 11/24/2006  . OSTEOARTHRITIS, MULTI SITES 11/24/2006  . APNEA, SLEEP 11/24/2006   1. Chronic obstructive pulmonary disease, unspecified COPD type (HCC) Stable continue using inhalers as prescribed.  2. OSA on CPAP Stable, patient reports good symptom resolution when using CPAP.  She should continue to use her CPAP nightly as prescribed.  3. SOB (shortness of breath) FEV1 1.5 on Spiriva with 80% of the predicted value. - Spirometry with Graph  4. Shortness of breath - CT CHEST WO CONTRAST; Future  5. Abnormal findings on diagnostic imaging of lung 7  mm nodule right lower lung.  Radiology advised 6-12 month follow up.  CT chest without contrast ordered at this visit per their recommendations.  - CT CHEST WO CONTRAST; Future  6. Lung nodule 7 mm right lower lung.  We will continue to monitor as requested.  7. HYPERTENSION, BENIGN SYSTEMIC Patient's blood pressure elevated at today's visit 161/102.  Patient reports she has not taken her blood pressure medicine and I advised her to do that.  We discussed compliance with medication and she verbalized understanding. - amLODipine (NORVASC) 5 MG tablet; amlodipine 5 mg tablet   General Counseling: I have discussed the findings of the evaluation and examination with Scarlette Calico.  I have also discussed any  further diagnostic evaluation thatmay be needed or ordered today. Nolie verbalizes understanding of the findings of todays visit. We also reviewed her medications today and discussed drug interactions and side effects including but not limited excessive drowsiness and altered mental states. We also discussed that there is always a risk not just to her but also people around her. she has been encouraged to call the office with any questions or concerns that should arise related to todays visit.    Time spent:  25 This patient was seen by Blima Ledger AGNP-C in Collaboration with Dr. Freda Munro as a part of collaborative care agreement.   I have personally obtained a history, examined the patient, evaluated laboratory and imaging results, formulated the assessment and plan and placed orders.    Yevonne Pax, MD [facility] Pulmonary and Critical Care Sleep medicine

## 2018-10-05 ENCOUNTER — Telehealth: Payer: Self-pay | Admitting: Internal Medicine

## 2018-10-05 NOTE — Telephone Encounter (Signed)
Faxed cpap supply order to [facility] and put into scan.jw

## 2018-11-27 ENCOUNTER — Ambulatory Visit: Payer: Medicare Other | Attending: Adult Health

## 2018-12-12 ENCOUNTER — Ambulatory Visit: Payer: Self-pay | Admitting: Internal Medicine

## 2019-01-09 ENCOUNTER — Other Ambulatory Visit: Payer: Self-pay

## 2019-01-09 ENCOUNTER — Emergency Department: Payer: Medicare Other

## 2019-01-09 ENCOUNTER — Observation Stay
Admission: EM | Admit: 2019-01-09 | Discharge: 2019-01-10 | Disposition: A | Discharge status: Home / Self Care | Payer: Medicare Other | Attending: Internal Medicine | Admitting: Internal Medicine

## 2019-01-09 ENCOUNTER — Encounter: Payer: Self-pay | Admitting: Emergency Medicine

## 2019-01-09 DIAGNOSIS — Z7902 Long term (current) use of antithrombotics/antiplatelets: Secondary | ICD-10-CM | POA: Diagnosis not present

## 2019-01-09 DIAGNOSIS — I428 Other cardiomyopathies: Secondary | ICD-10-CM | POA: Insufficient documentation

## 2019-01-09 DIAGNOSIS — Z888 Allergy status to other drugs, medicaments and biological substances status: Secondary | ICD-10-CM | POA: Insufficient documentation

## 2019-01-09 DIAGNOSIS — Z8673 Personal history of transient ischemic attack (TIA), and cerebral infarction without residual deficits: Secondary | ICD-10-CM | POA: Insufficient documentation

## 2019-01-09 DIAGNOSIS — E876 Hypokalemia: Secondary | ICD-10-CM | POA: Diagnosis not present

## 2019-01-09 DIAGNOSIS — G4730 Sleep apnea, unspecified: Secondary | ICD-10-CM | POA: Insufficient documentation

## 2019-01-09 DIAGNOSIS — Z79899 Other long term (current) drug therapy: Secondary | ICD-10-CM | POA: Diagnosis not present

## 2019-01-09 DIAGNOSIS — R000 Tachycardia, unspecified: Secondary | ICD-10-CM | POA: Diagnosis not present

## 2019-01-09 DIAGNOSIS — Z791 Long term (current) use of non-steroidal anti-inflammatories (NSAID): Secondary | ICD-10-CM | POA: Insufficient documentation

## 2019-01-09 DIAGNOSIS — I110 Hypertensive heart disease with heart failure: Secondary | ICD-10-CM | POA: Diagnosis not present

## 2019-01-09 DIAGNOSIS — I5043 Acute on chronic combined systolic (congestive) and diastolic (congestive) heart failure: Secondary | ICD-10-CM | POA: Insufficient documentation

## 2019-01-09 DIAGNOSIS — F419 Anxiety disorder, unspecified: Secondary | ICD-10-CM | POA: Diagnosis not present

## 2019-01-09 DIAGNOSIS — R05 Cough: Secondary | ICD-10-CM | POA: Diagnosis not present

## 2019-01-09 DIAGNOSIS — K219 Gastro-esophageal reflux disease without esophagitis: Secondary | ICD-10-CM | POA: Diagnosis present

## 2019-01-09 DIAGNOSIS — E785 Hyperlipidemia, unspecified: Secondary | ICD-10-CM | POA: Diagnosis present

## 2019-01-09 DIAGNOSIS — Z20828 Contact with and (suspected) exposure to other viral communicable diseases: Secondary | ICD-10-CM | POA: Diagnosis not present

## 2019-01-09 DIAGNOSIS — Z886 Allergy status to analgesic agent status: Secondary | ICD-10-CM | POA: Insufficient documentation

## 2019-01-09 DIAGNOSIS — Z7901 Long term (current) use of anticoagulants: Secondary | ICD-10-CM | POA: Diagnosis not present

## 2019-01-09 DIAGNOSIS — A419 Sepsis, unspecified organism: Secondary | ICD-10-CM | POA: Diagnosis present

## 2019-01-09 DIAGNOSIS — I10 Essential (primary) hypertension: Secondary | ICD-10-CM | POA: Diagnosis present

## 2019-01-09 DIAGNOSIS — F0390 Unspecified dementia without behavioral disturbance: Secondary | ICD-10-CM | POA: Diagnosis present

## 2019-01-09 DIAGNOSIS — R6510 Systemic inflammatory response syndrome (SIRS) of non-infectious origin without acute organ dysfunction: Secondary | ICD-10-CM | POA: Diagnosis present

## 2019-01-09 DIAGNOSIS — R509 Fever, unspecified: Secondary | ICD-10-CM | POA: Insufficient documentation

## 2019-01-09 DIAGNOSIS — I480 Paroxysmal atrial fibrillation: Secondary | ICD-10-CM | POA: Diagnosis not present

## 2019-01-09 DIAGNOSIS — Z8249 Family history of ischemic heart disease and other diseases of the circulatory system: Secondary | ICD-10-CM | POA: Insufficient documentation

## 2019-01-09 DIAGNOSIS — Z794 Long term (current) use of insulin: Secondary | ICD-10-CM | POA: Insufficient documentation

## 2019-01-09 DIAGNOSIS — Z885 Allergy status to narcotic agent status: Secondary | ICD-10-CM | POA: Diagnosis not present

## 2019-01-09 DIAGNOSIS — I5023 Acute on chronic systolic (congestive) heart failure: Secondary | ICD-10-CM | POA: Diagnosis present

## 2019-01-09 DIAGNOSIS — E119 Type 2 diabetes mellitus without complications: Secondary | ICD-10-CM

## 2019-01-09 HISTORY — DX: Heart failure, unspecified: I50.9

## 2019-01-09 LAB — APTT: aPTT: 34 seconds (ref 24–36)

## 2019-01-09 LAB — COMPREHENSIVE METABOLIC PANEL
ALT: 23 U/L (ref 0–44)
AST: 34 U/L (ref 15–41)
Albumin: 3.7 g/dL (ref 3.5–5.0)
Alkaline Phosphatase: 74 U/L (ref 38–126)
Anion gap: 13 (ref 5–15)
BUN: 17 mg/dL (ref 8–23)
CO2: 25 mmol/L (ref 22–32)
Calcium: 8.6 mg/dL — ABNORMAL LOW (ref 8.9–10.3)
Chloride: 100 mmol/L (ref 98–111)
Creatinine, Ser: 0.82 mg/dL (ref 0.44–1.00)
GFR calc Af Amer: >60 mL/min (ref >60)
GFR calc non Af Amer: >60 mL/min (ref >60)
Glucose, Bld: 134 mg/dL — ABNORMAL HIGH (ref 70–99)
Potassium: 3.8 mmol/L (ref 3.5–5.1)
Sodium: 138 mmol/L (ref 135–145)
Total Bilirubin: 1.3 mg/dL — ABNORMAL HIGH (ref 0.3–1.2)
Total Protein: 7.4 g/dL (ref 6.5–8.1)

## 2019-01-09 LAB — RESPIRATORY PANEL BY PCR

## 2019-01-09 LAB — CBC WITH DIFFERENTIAL/PLATELET
Abs Immature Granulocytes: 0.06 10*3/uL (ref 0.00–0.07)
Basophils Absolute: 0 10*3/uL (ref 0.0–0.1)
Basophils Relative: 0 %
Eosinophils Absolute: 0 10*3/uL (ref 0.0–0.5)
Eosinophils Relative: 0 %
HCT: 38.9 % (ref 36.0–46.0)
Hemoglobin: 12.3 g/dL (ref 12.0–15.0)
Immature Granulocytes: 1 %
Lymphocytes Relative: 5 %
Lymphs Abs: 0.6 10*3/uL — ABNORMAL LOW (ref 0.7–4.0)
MCH: 27.8 pg (ref 26.0–34.0)
MCHC: 31.6 g/dL (ref 30.0–36.0)
MCV: 88 fL (ref 80.0–100.0)
Monocytes Absolute: 0.7 10*3/uL (ref 0.1–1.0)
Monocytes Relative: 6 %
Neutro Abs: 11.2 10*3/uL — ABNORMAL HIGH (ref 1.7–7.7)
Neutrophils Relative %: 88 %
Platelets: 238 10*3/uL (ref 150–400)
RBC: 4.42 MIL/uL (ref 3.87–5.11)
RDW: 15.2 % (ref 11.5–15.5)
Smear Review: NORMAL
WBC: 12.7 10*3/uL — ABNORMAL HIGH (ref 4.0–10.5)
nRBC: 0 % (ref 0.0–0.2)

## 2019-01-09 LAB — SARS CORONAVIRUS 2 BY RT PCR (HOSPITAL ORDER, PERFORMED IN ~~LOC~~ HOSPITAL LAB): SARS Coronavirus 2: NEGATIVE

## 2019-01-09 LAB — PROTIME-INR
INR: 1.7 — ABNORMAL HIGH (ref 0.8–1.2)
Prothrombin Time: 19.4 seconds — ABNORMAL HIGH (ref 11.4–15.2)

## 2019-01-09 LAB — PROCALCITONIN: Procalcitonin: 0.25 ng/mL

## 2019-01-09 LAB — LACTIC ACID, PLASMA
Lactic Acid, Venous: 2 mmol/L (ref 0.5–1.9)
Lactic Acid, Venous: 1.5 mmol/L (ref 0.5–1.9)

## 2019-01-09 LAB — BRAIN NATRIURETIC PEPTIDE: B Natriuretic Peptide: 1390 pg/mL — ABNORMAL HIGH (ref 0.0–100.0)

## 2019-01-09 LAB — TROPONIN I: Troponin I: 0.04 ng/mL (ref <0.03)

## 2019-01-09 MED ORDER — VANCOMYCIN HCL 10 G IV SOLR: 1250.0000 mg | INTRAVENOUS | Status: DC

## 2019-01-09 MED ORDER — HALOPERIDOL LACTATE 5 MG/ML IJ SOLN
2.0000 mg | Freq: Once | INTRAMUSCULAR | Status: AC
  Administered 2019-01-09: 2 mg via INTRAVENOUS
  Filled 2019-01-09: qty 1

## 2019-01-09 MED ORDER — VANCOMYCIN HCL 10 G IV SOLR
2000.0000 mg | Freq: Once | INTRAVENOUS | Status: AC
  Administered 2019-01-09: 2000 mg via INTRAVENOUS
  Filled 2019-01-09: qty 2000

## 2019-01-09 MED ORDER — FUROSEMIDE 10 MG/ML IJ SOLN
20.0000 mg | Freq: Once | INTRAMUSCULAR | Status: AC
  Administered 2019-01-09: 20 mg via INTRAVENOUS
  Filled 2019-01-09: qty 4

## 2019-01-09 MED ORDER — SODIUM CHLORIDE 0.9 % IV SOLN
2.0000 g | Freq: Once | INTRAVENOUS | Status: AC
  Administered 2019-01-09: 2 g via INTRAVENOUS
  Filled 2019-01-09: qty 2

## 2019-01-09 MED ORDER — HALOPERIDOL LACTATE 5 MG/ML IJ SOLN
5.0000 mg | Freq: Once | INTRAMUSCULAR | Status: AC
  Administered 2019-01-09: 5 mg via INTRAVENOUS
  Filled 2019-01-09: qty 1

## 2019-01-09 MED ORDER — VANCOMYCIN HCL IN DEXTROSE 1-5 GM/200ML-% IV SOLN: 1000.0000 mg | Freq: Once | INTRAVENOUS | Status: DC

## 2019-01-09 MED ORDER — SODIUM CHLORIDE 0.9 % IV SOLN
2.0000 g | Freq: Two times a day (BID) | INTRAVENOUS | Status: DC
  Filled 2019-01-09: qty 2

## 2019-01-09 NOTE — ED Notes (Signed)
Patient not receiving any IV fluids due to history of CHF and possibility of pulmonary edema.

## 2019-01-09 NOTE — ED Notes (Signed)
Urine was not collected due to pt continuing to flush and put tissue in the sample pan. Herbert Seta, RN made aware.

## 2019-01-09 NOTE — ED Notes (Signed)
Pt is RSV positive per lab. Ethel Rana., RN made aware.

## 2019-01-09 NOTE — Consult Note (Signed)
CODE SEPSIS - PHARMACY COMMUNICATION  **Broad Spectrum Antibiotics should be administered within 1 hour of Sepsis diagnosis**  Time Code Sepsis Called/Page Received: 1804  Antibiotics Ordered: vancomycin and cefepime  Time of 1st antibiotic administration: 1844  Additional action taken by pharmacy: none required  If necessary, Name of Provider/Nurse Contacted: N/A   Lowella Bandy ,PharmD Clinical Pharmacist  01/09/2019  7:18 PM

## 2019-01-09 NOTE — ED Notes (Signed)
Pt was given boxed meal tray and drink to eat. She is in bed with rails upx2, on the monitor and call bell is within reach.

## 2019-01-09 NOTE — ED Provider Notes (Signed)
[facility] Emergency Department Provider Note  ____________________________________________  Time seen: Approximately 10:27 PM  I have reviewed the triage vital signs and the nursing notes.   HISTORY  Chief Complaint Shortness of Breath    Level 5 Caveat: Portions of the History and Physical including HPI and review of systems are unable to be completely obtained due to patient being a poor historian   HPI Leah Ritter is a 68 y.o. female with a history of diabetes hypertension GERD CHF and dementia who was sent to the ED by EMS today due to shortness of breath and cough for the last 3 days.  Cough is productive of thick blood-tinged sputum.  Patient denies fevers chills or body aches.  Patient is clearly confused about her medication regimen and her underlying medical illnesses.  She also voices delusions about her medical history and paranoia that her own daughter may be "poisoning" her medicine.  Because of this is a constant struggle for her family to get her to take her medicines, according to the daughter with whom I have spoken.  Reviewed electronic medical record, recent neurology visit showing that she is being evaluated for dementia, her Mini-Mental score was 18, and she has been started on Aricept and Seroquel.      Past Medical History:  Diagnosis Date  . Anxiety   . CHF (congestive heart failure) (HCC)   . Diabetes (HCC)   . GERD (gastroesophageal reflux disease)   . Hypertension   . Sleep apnea   . Stroke [facility])      Patient Active Problem List   Diagnosis Date Noted  . SIRS (systemic inflammatory response syndrome) (HCC) 01/09/2019  . Acute on chronic systolic CHF (congestive heart failure) (HCC) 01/09/2019  . Pain in joint involving ankle and foot 01/03/2018  . Localized, primary osteoarthritis of shoulder region 01/03/2018  . Rupture of tendon of biceps, long head 01/03/2018  . Disorder of bursae of shoulder region 01/03/2018  .  Screening declined by patient 10/25/2017  . Full thickness rotator cuff tear 04/21/2016  . Chronic bilateral low back pain without sciatica 02/10/2016  . H/O rotator cuff tear 02/10/2016  . History of vitamin D deficiency 02/10/2016  . Type 2 diabetes mellitus without complication, with long-term current use of insulin (HCC) 02/10/2016  . Diabetes (HCC) 11/24/2006  . HLD (hyperlipidemia) 11/24/2006  . OBESITY, NOS 11/24/2006  . POST TRAUMATIC STRESS DISORDER 11/24/2006  . DEPRESSIVE DISORDER, NOS 11/24/2006  . HYPERTENSION, BENIGN SYSTEMIC 11/24/2006  . RHINITIS, ALLERGIC 11/24/2006  . GERD (gastroesophageal reflux disease) 11/24/2006  . OSTEOARTHRITIS, MULTI SITES 11/24/2006  . APNEA, SLEEP 11/24/2006     Past Surgical History:  Procedure Laterality Date  . PARTIAL HYSTERECTOMY    . tubligation       Prior to Admission medications   Medication Sig Start Date End Date Taking? Authorizing Provider  amLODipine (NORVASC) 5 MG tablet amlodipine 5 mg tablet    [provider]  apixaban (ELIQUIS) 5 MG TABS tablet Take by mouth. 08/21/18   [provider]  atorvastatin (LIPITOR) 10 MG tablet atorvastatin 10 mg tablet    [provider]  baclofen (LIORESAL) 10 MG tablet baclofen 10 mg tablet    [provider]  carvedilol (COREG) 25 MG tablet Take by mouth. 02/09/18   [provider]  Cholecalciferol 1000 units tablet Take 1,000 Units by mouth daily.    [provider]  clopidogrel (PLAVIX) 75 MG tablet Take 75 mg by mouth daily.  [provider]  CONTOUR NEXT TEST test strip CHECK BLOOD SUGAR TID UTD 11/14/17   [provider]  cyclobenzaprine (FLEXERIL) 5 MG tablet Take 5 mg by mouth 3 (three) times daily as needed for muscle spasms.    [provider]  fluticasone Aleda Grana) 50 MCG/ACT nasal spray  08/29/18   [provider]  Fluticasone-Umeclidin-Vilant (TRELEGY ELLIPTA) 100-62.5-25 MCG/INH AEPB  Inhale 1 puff into the lungs daily. 03/13/18   Yevonne Pax, MD  furosemide (LASIX) 20 MG tablet Take by mouth. 03/17/18 03/17/19  [provider]  glipiZIDE (GLUCOTROL) 10 MG tablet Take 10 mg by mouth daily before breakfast.    [provider]  lisinopril (PRINIVIL,ZESTRIL) 5 MG tablet Take by mouth. 02/27/18   [provider]  losartan (COZAAR) 100 MG tablet Take 100 mg by mouth daily.    [provider]  meloxicam (MOBIC) 15 MG tablet meloxicam 15 mg tablet    [provider]  metFORMIN (GLUCOPHAGE) 500 MG tablet Take by mouth 2 (two) times daily with a meal.    [provider]  rosuvastatin (CRESTOR) 5 MG tablet Take 5 mg by mouth daily.    [provider]  sertraline (ZOLOFT) 50 MG tablet sertraline 50 mg tablet 11/15/16   [provider]  Travoprost, BAK Free, (TRAVATAN Z) 0.004 % SOLN ophthalmic solution  05/10/17   [provider]  VENTOLIN HFA 108 (90 Base) MCG/ACT inhaler INL 2 INHALATIONS ITL Q 4 H PRF WHZ 11/27/17   [provider]  Vitamin D, Ergocalciferol, 2000 units CAPS Take by mouth.    [provider]     Allergies Ezetimibe; Oxycodone-acetaminophen; and Statins   Family History  Problem Relation Age of Onset  . Breast cancer Mother   . Stroke Father   . Hypertension Father   . Kidney disease Brother   . Cancer Brother     Social History Social History   Tobacco Use  . Smoking status: Never Smoker  . Smokeless tobacco: Never Used  Substance Use Topics  . Alcohol use: Not Currently  . Drug use: Never    Review of Systems Level 5 Caveat: Portions of the History and Physical including HPI and review of systems are unable to be completely obtained due to patient being a poor historian   Constitutional:   No known fever.  ENT:   No rhinorrhea. Cardiovascular:   No chest pain or syncope. Respiratory: Positive shortness of breath and cough. Gastrointestinal:    Negative for abdominal pain, vomiting and diarrhea.  Musculoskeletal: Positive bilateral leg swelling ____________________________________________   PHYSICAL EXAM:  VITAL SIGNS: ED Triage Vitals  Enc Vitals Group     BP 01/09/19 1801 (!) 144/90     Pulse Rate 01/09/19 1801 (!) 120     Resp 01/09/19 1801 (!) 34     Temp 01/09/19 1801 (!) 102.1 F (38.9 C)     Temp Source 01/09/19 1801 Axillary     SpO2 01/09/19 1801 100 %     Weight 01/09/19 1756 200 lb (90.7 kg)     Height 01/09/19 1756 5\' 4"  (1.626 m)     Head Circumference --      Peak Flow --      Pain Score 01/09/19 1755 0     Pain Loc --      Pain Edu? --      Excl. in GC? --     Vital signs reviewed, nursing assessments reviewed.   Constitutional:  Alert and oriented to person and place.  Ill-appearing Eyes:   Conjunctivae are normal. EOMI. PERRL. ENT      Head:   Normocephalic and atraumatic.      Nose:   No congestion/rhinnorhea.       Mouth/Throat:   MMM, no pharyngeal erythema. No peritonsillar mass.       Neck:   No meningismus. Full ROM. Hematological/Lymphatic/Immunilogical:   No cervical lymphadenopathy. Cardiovascular:   Tachycardia heart rate 120. Symmetric bilateral radial and DP pulses.  No murmurs. Cap refill less than 2 seconds. Respiratory:   Tachypnea.  No wheezing.  Diffuse crackles in all lung fields.. Gastrointestinal:   Soft and nontender. Non distended. There is no CVA tenderness.  No rebound, rigidity, or guarding.  Musculoskeletal:   Normal range of motion in all extremities. No joint effusions.  No lower extremity tenderness.  1+ pitting edema bilateral lower extremities. Neurologic:   Normal speech.  Language content is frequently delusional, perseverating on her disagreements with prior medical providers and her family about her chronic medical conditions Motor grossly intact.   Skin:    Skin is warm, dry and intact. No rash noted.  No petechiae, purpura, or  bullae.  ____________________________________________    LABS (pertinent positives/negatives) (all labs ordered are listed, but only abnormal results are displayed) Labs Reviewed  COMPREHENSIVE METABOLIC PANEL - Abnormal; Notable for the following components:      Result Value   Glucose, Bld 134 (*)    Calcium 8.6 (*)    Total Bilirubin 1.3 (*)    All other components within normal limits  TROPONIN I - Abnormal; Notable for the following components:   Troponin I 0.04 (*)    All other components within normal limits  CBC WITH DIFFERENTIAL/PLATELET - Abnormal; Notable for the following components:   WBC 12.7 (*)    Neutro Abs 11.2 (*)    Lymphs Abs 0.6 (*)    All other components within normal limits  BRAIN NATRIURETIC PEPTIDE - Abnormal; Notable for the following components:   B Natriuretic Peptide 1,390.0 (*)    All other components within normal limits  LACTIC ACID, PLASMA - Abnormal; Notable for the following components:   Lactic Acid, Venous 2.0 (*)    All other components within normal limits  PROTIME-INR - Abnormal; Notable for the following components:   Prothrombin Time 19.4 (*)    INR 1.7 (*)    All other components within normal limits  SARS CORONAVIRUS 2 (HOSPITAL ORDER, PERFORMED IN [facility])  CULTURE, BLOOD (ROUTINE X 2)  CULTURE, BLOOD (ROUTINE X 2)  URINE CULTURE  RESPIRATORY PANEL BY PCR  PROCALCITONIN  APTT  LACTIC ACID, PLASMA  URINALYSIS, COMPLETE (UACMP) WITH MICROSCOPIC  LACTIC ACID, PLASMA   ____________________________________________   EKG  Interpreted by me Sinus tachycardia rate 113, right axis, normal intervals.  Normal QRS ST segments and T waves.  1 PVC on the strip.  ____________________________________________    RADIOLOGY  Dg Chest Portable 1 View  Result Date: 01/09/2019 CLINICAL DATA:  Cough and dyspnea EXAM: PORTABLE CHEST 1 VIEW COMPARISON:  01/13/2018 FINDINGS: Cardiac shadow is enlarged. Lungs are well  aerated bilaterally with evidence of vascular congestion and early interstitial edema. No focal confluent infiltrate is seen. Small right-sided pleural effusion is noted. No bony abnormality is noted. IMPRESSION: Changes consistent with mild CHF. Electronically Signed   By: Alcide Clever M.D.   On: 01/09/2019 19:28    ____________________________________________   PROCEDURES .Critical Care  Performed by: Sharman Cheek, MD Authorized by: Sharman Cheek, MD   Critical care provider statement:    Critical care time (minutes):  35   Critical care time was exclusive of:  Separately billable procedures and treating other patients   Critical care was necessary to treat or prevent imminent or life-threatening deterioration of the following conditions:  Sepsis and CNS failure or compromise   Critical care was time spent personally by me on the following activities:  Development of treatment plan with patient or surrogate, discussions with consultants, evaluation of patient's response to treatment, examination of patient, obtaining history from patient or surrogate, ordering and performing treatments and interventions, ordering and review of laboratory studies, ordering and review of radiographic studies, pulse oximetry, re-evaluation of patient's condition and review of old charts    ____________________________________________  DIFFERENTIAL DIAGNOSIS   Healthcare associated pneumonia, sepsis, urinary tract infection, congestive heart failure/pulmonary edema, pleural effusion  CLINICAL IMPRESSION / ASSESSMENT AND PLAN / ED COURSE  Pertinent labs & imaging results that were available during my care of the patient were reviewed by me and considered in my medical decision making (see chart for details).   Leah Ritter was evaluated in Emergency Department on 01/09/2019 for the symptoms described in the history of present illness. She was evaluated in the context of the global COVID-19  pandemic, which necessitated consideration that the patient might be at risk for infection with the SARS-CoV-2 virus that causes COVID-19. Institutional protocols and algorithms that pertain to the evaluation of patients at risk for COVID-19 are in a state of rapid change based on information released by regulatory bodies including the CDC and federal and state organizations. These policies and algorithms were followed during the patient's care in the ED.     Clinical Course as of Jan 08 2226  Tue Jan 09, 2019  1812 P/w sob and cough x 3 days. Reported temp by EMS was 98. However, on ED assessment, found to have tachycardia, fever. Has known chain of exposure to COVID+ patient at [facility]. Will test for COVID, initiate sepsis protocol for HCAP, give cefepime and vancomycin. Possible pulmonary edema - will obtain cxr, labs, hold on IVF pending results and reassessment.   [PS]  2004 Lactate 2.0, chest x-ray consistent with mild CHF.  Leukocytosis of 12,000 with lymphopenia.  Pro-Cal and coronavirus test pending.   [PS]  2030 Bp stable. Sepsis reassessment completed. No signs of shock. Wiill diurese for pulm edema / chf exac.   [PS]  2127 COVID negative  SARS Coronavirus 2: NEGATIVE [PS]    Clinical Course User Index [PS] Sharman Cheek, MD    ----------------------------------------- 10:31 PM on 01/09/2019 -----------------------------------------  Discussed with hospitalist for further management.  Discussed with daughter at length, she and I agree that the patient lacks capacity to make medical decisions for herself at this time we will give her Haldol as needed for agitation and admit to hospital for further management of suspected sepsis and congestive heart failure exacerbation.  No clear infectious source, urinalysis still pending.  No evidence of any skin or soft tissue infection or intra-abdominal pathology.  Doubt meningitis or  encephalitis.   ____________________________________________   FINAL CLINICAL IMPRESSION(S) / ED DIAGNOSES    Final diagnoses:  Acute on chronic combined systolic and diastolic congestive heart failure (HCC)  Sepsis, due to unspecified organism, unspecified whether acute organ dysfunction present [facility])  Dementia without behavioral disturbance, unspecified dementia type [facility])     ED Discharge Orders  None      Portions of this note were generated with dragon dictation software. Dictation errors may occur despite best attempts at proofreading.   Sharman Cheek, MD 01/09/19 2232

## 2019-01-09 NOTE — Consult Note (Signed)
Pharmacy Antibiotic Note  Leah Ritter is a 68 y.o. female admitted on 01/09/2019 with sepsis and possible pneumonia. Pharmacy has been consulted for Vancomycin and Cefepime dosing.  4/14  Lactic Acid: 2.0  WBC: 12.7  PCT: 0.25  Tmax: 102.1   Plan: Vancomycin 2000mg  IV x 1 dose, followed by Vancomycin 1250 mg IV Q 24 hrs. Goal AUC 400-550. Expected AUC: 535 SCr used: 0.82 BMI :30  Start Cefepime 2g IV every 12 hours    Height: 5\' 4"  (162.6 cm) Weight: 200 lb (90.7 kg) IBW/kg (Calculated) : 54.7  Temp (24hrs), Avg:102.1 F (38.9 C), Min:102.1 F (38.9 C), Max:102.1 F (38.9 C)  Recent Labs  Lab 01/09/19 1838  WBC 12.7*  CREATININE 0.82  LATICACIDVEN 2.0*    Estimated Creatinine Clearance: 71.6 mL/min (by C-G formula based on SCr of 0.82 mg/dL).    Allergies  Allergen Reactions  . Ezetimibe     Other reaction(s): Unknown  . Oxycodone-Acetaminophen     Other reaction(s): Vomiting  . Statins     Other reaction(s): Other (See Comments)    Antimicrobials this admission: 4/14 Vancomycin  >>  4/14 Cefepime >>   Microbiology results: 4/14 Respiratory Panel: pending 4/14 SARS Coronavirus 2: negative  4/14 BCx: pending 4/14 UCx: pending 4/14  MRSA PCR: pending  Thank you for allowing pharmacy to be a part of this patient's care.  Gardner Candle, PharmD, BCPS Clinical Pharmacist 01/09/2019 10:44 PM

## 2019-01-09 NOTE — H&P (Addendum)
[facility] [place] [facility]   PATIENT NAME: Leah Ritter    MR#:  [id number]  DATE OF BIRTH:  [birth year]  DATE OF ADMISSION:  01/09/2019  PRIMARY CARE PHYSICIAN: [facility]   REQUESTING/REFERRING PHYSICIAN: Scotty Court, MD  CHIEF COMPLAINT:   Chief Complaint  Patient presents with  . Shortness of Breath    HISTORY OF PRESENT ILLNESS:  Leah Ritter  is a 68 y.o. female who presents with chief complaint as above.  Patient presents to the ED with malaise and shortness of breath.  She has dementia and is unable to provide accurate information for her history.  On arrival to ED she was febrile and tachycardic.  Work-up here is inconsistent with bacterial infection, with no clear pneumonia on chest x-ray or strong suspicion of urine infection based on UA.  Procalcitonin was low.  Coronavirus testing was negative.  Patient does have a history of heart failure and has some mild edema on her x-ray.  Respiratory panel was positive for RSV.  Hospitalist were called for admission.  PAST MEDICAL HISTORY:   Past Medical History:  Diagnosis Date  . Anxiety   . CHF (congestive heart failure) (HCC)   . Diabetes (HCC)   . GERD (gastroesophageal reflux disease)   . Hypertension   . Sleep apnea   . Stroke [facility])      PAST SURGICAL HISTORY:   Past Surgical History:  Procedure Laterality Date  . PARTIAL HYSTERECTOMY    . tubligation       SOCIAL HISTORY:   Social History   Tobacco Use  . Smoking status: Never Smoker  . Smokeless tobacco: Never Used  Substance Use Topics  . Alcohol use: Not Currently     FAMILY HISTORY:   Family History  Problem Relation Age of Onset  . Breast cancer Mother   . Stroke Father   . Hypertension Father   . Kidney disease Brother   . Cancer Brother      DRUG ALLERGIES:   Allergies  Allergen Reactions  . Ezetimibe     Other reaction(s): Unknown  . Oxycodone-Acetaminophen     Other  reaction(s): Vomiting  . Statins     Other reaction(s): Other (See Comments)    MEDICATIONS AT HOME:   Prior to Admission medications   Medication Sig Start Date End Date Taking? Authorizing Provider  amLODipine (NORVASC) 5 MG tablet amlodipine 5 mg tablet    [provider]  apixaban (ELIQUIS) 5 MG TABS tablet Take by mouth. 08/21/18   [provider]  atorvastatin (LIPITOR) 10 MG tablet atorvastatin 10 mg tablet    [provider]  baclofen (LIORESAL) 10 MG tablet baclofen 10 mg tablet    [provider]  carvedilol (COREG) 25 MG tablet Take by mouth. 02/09/18   [provider]  Cholecalciferol 1000 units tablet Take 1,000 Units by mouth daily.    [provider]  clopidogrel (PLAVIX) 75 MG tablet Take 75 mg by mouth daily.    [provider]  CONTOUR NEXT TEST test strip CHECK BLOOD SUGAR TID UTD 11/14/17   [provider]  cyclobenzaprine (FLEXERIL) 5 MG tablet Take 5 mg by mouth 3 (three) times daily as needed for muscle spasms.    [provider]  fluticasone Aleda Grana) 50 MCG/ACT nasal spray  08/29/18   [provider]  Fluticasone-Umeclidin-Vilant (TRELEGY ELLIPTA) 100-62.5-25 MCG/INH AEPB Inhale 1 puff into the lungs daily. 03/13/18   Yevonne Pax,  MD  furosemide (LASIX) 20 MG tablet Take by mouth. 03/17/18 03/17/19  [provider]  glipiZIDE (GLUCOTROL) 10 MG tablet Take 10 mg by mouth daily before breakfast.    [provider]  lisinopril (PRINIVIL,ZESTRIL) 5 MG tablet Take by mouth. 02/27/18   [provider]  losartan (COZAAR) 100 MG tablet Take 100 mg by mouth daily.    [provider]  meloxicam (MOBIC) 15 MG tablet meloxicam 15 mg tablet    [provider]  metFORMIN (GLUCOPHAGE) 500 MG tablet Take by mouth 2 (two) times daily with a meal.    [provider]  rosuvastatin (CRESTOR) 5 MG tablet Take 5 mg by mouth daily.    [provider]  sertraline (ZOLOFT) 50 MG tablet sertraline 50 mg tablet 11/15/16   [provider]  Travoprost, BAK Free, (TRAVATAN Z) 0.004 % SOLN ophthalmic solution  05/10/17   [provider]  VENTOLIN HFA 108 (90 Base) MCG/ACT inhaler INL 2 INHALATIONS ITL Q 4 H PRF WHZ 11/27/17   [provider]  Vitamin D, Ergocalciferol, 2000 units CAPS Take by mouth.    [provider]    REVIEW OF SYSTEMS:  Review of Systems  Constitutional: Positive for fever and malaise/fatigue. Negative for chills and weight loss.  HENT: Negative for ear pain, hearing loss and tinnitus.   Eyes: Negative for blurred vision, double vision, pain and redness.  Respiratory: Positive for shortness of breath. Negative for cough and hemoptysis.   Cardiovascular: Negative for chest pain, palpitations, orthopnea and leg swelling.  Gastrointestinal: Negative for abdominal pain, constipation, diarrhea, nausea and vomiting.  Genitourinary: Negative for dysuria, frequency and hematuria.  Musculoskeletal: Negative for back pain, joint pain and neck pain.  Skin:       No acne, rash, or lesions  Neurological: Negative for dizziness, tremors, focal weakness and weakness.  Endo/Heme/Allergies: Negative for polydipsia. Does not bruise/bleed easily.  Psychiatric/Behavioral: Negative for depression. The patient is not nervous/anxious and does not have insomnia.      VITAL SIGNS:   Vitals:   01/09/19 1945 01/09/19 2000 01/09/19 2015 01/09/19 2030  BP:  (!) 149/85  (!) 126/112  Pulse: (!) 101 (!) 102  (!) 129  Resp: 19 (!) 26 17 (!) 22  Temp:      TempSrc:      SpO2: 99% 99%  99%  Weight:      Height:       Wt Readings from Last 3 Encounters:  01/09/19 90.7 kg  09/05/18 100.2 kg  03/13/18 89.8 kg    PHYSICAL EXAMINATION:  Physical Exam  Vitals reviewed. Constitutional: She appears well-developed and well-nourished. No distress.  HENT:  Head: Normocephalic and atraumatic.   Mouth/Throat: Oropharynx is clear and moist.  Eyes: Pupils are equal, round, and reactive to light. Conjunctivae and EOM are normal. No scleral icterus.  Neck: Normal range of motion. Neck supple. No JVD present. No thyromegaly present.  Cardiovascular: Regular rhythm and intact distal pulses. Exam reveals no gallop and no friction rub.  No murmur heard. Tachycardic  Respiratory: Effort normal. No respiratory distress. She has no wheezes. She has rales.  GI: Soft. Bowel sounds are normal. She exhibits no distension. There is no abdominal tenderness.  Musculoskeletal: Normal range of motion.        General: No edema.     Comments: No arthritis, no gout  Lymphadenopathy:    She has no cervical adenopathy.  Neurological: She is alert. No cranial nerve  deficit.  No dysarthria, no aphasia  Skin: Skin is warm and dry. No rash noted. No erythema.  Psychiatric:  Unable to fully assess    LABORATORY PANEL:   CBC Recent Labs  Lab 01/09/19 1838  WBC 12.7*  HGB 12.3  HCT 38.9  PLT 238   ------------------------------------------------------------------------------------------------------------------  Chemistries  Recent Labs  Lab 01/09/19 1838  NA 138  K 3.8  CL 100  CO2 25  GLUCOSE 134*  BUN 17  CREATININE 0.82  CALCIUM 8.6*  AST 34  ALT 23  ALKPHOS 74  BILITOT 1.3*   ------------------------------------------------------------------------------------------------------------------  Cardiac Enzymes Recent Labs  Lab 01/09/19 1838  TROPONINI 0.04*   ------------------------------------------------------------------------------------------------------------------  RADIOLOGY:  Dg Chest Portable 1 View  Result Date: 01/09/2019 CLINICAL DATA:  Cough and dyspnea EXAM: PORTABLE CHEST 1 VIEW COMPARISON:  01/13/2018 FINDINGS: Cardiac shadow is enlarged. Lungs are well aerated bilaterally with evidence of vascular congestion and early interstitial edema. No focal confluent  infiltrate is seen. Small right-sided pleural effusion is noted. No bony abnormality is noted. IMPRESSION: Changes consistent with mild CHF. Electronically Signed   By: Alcide CleverMark  Lukens M.D.   On: 01/09/2019 19:28    EKG:   Orders placed or performed during the hospital encounter of 01/09/19  . ED EKG  . ED EKG    IMPRESSION AND PLAN:  Principal Problem:   Acute on chronic systolic CHF (congestive heart failure) (HCC) -IV Lasix given in the ED, will continue the patient's home meds and re-administer additional dose of Lasix if necessary.  Cardiology consult placed Active Problems:   SIRS (systemic inflammatory response syndrome) (HCC) -coronavirus was negative, infectious work-up is inconsistent with bacterial infection.  RSV is positive on respiratory panel and this is likely driving her symptomology   Diabetes (HCC) -sliding scale insulin   HYPERTENSION, BENIGN SYSTEMIC -home dose antihypertensives   HLD (hyperlipidemia) -home dose antilipid  Chart review performed and case discussed with ED provider. Labs, imaging and/or ECG reviewed by provider and discussed with patient/family. Management plans discussed with the patient and/or family.  DVT PROPHYLAXIS: SubQ lovenox   GI PROPHYLAXIS:  None  ADMISSION STATUS: Inpatient     CODE STATUS: Full  TOTAL TIME TAKING CARE OF THIS PATIENT: 45 minutes.   Barney DrainDavid F Omah Dewalt 01/09/2019, 10:16 PM  [facility]  Office  [phone]  CC: Primary care physician; [facility]  Note:  This document was prepared using Dragon voice recognition software and may include unintentional dictation errors.

## 2019-01-09 NOTE — ED Notes (Signed)
Pt states that she would like to go home and requested that I take all of the "tubes" out of her. I thoroughly tried to explain to pt the situation of why she is in the ED and what we are doing to treat her but she persisted that she wanted to go home. The pt is AOx4 but seems to be confused at times and confused regarding the situation that brought her into the ED today. The pt is still tachycardic and has coughed up small blood-tinged sputum at the bedside. She also has critical lab lactic acid and troponin lab values. Scotty Court, MD has been made aware of all of this information. Pt is in bed with rails upx2, on the monitor and call bell is within reach.

## 2019-01-09 NOTE — ED Notes (Addendum)
ED TO INPATIENT HANDOFF REPORT  ED Nurse Name and Phone #: Sandrea Hammond, RN [phone]  S Name/Age/Gender Leah Ritter 68 y.o. female Room/Bed: ED11A/ED11A  Code Status   Code Status: Not on file  Home/SNF/Other Home Patient oriented to: self and place Is this baseline? Yes   Triage Complete: Triage complete  Chief Complaint EMS; Difficulty breathing  Triage Note Patient from home via [facility]. Per EMS, patient has had worsening SOB for the last 2 days. States this morning she woke up and was unable to speak in complete sentences or exert herself without losing her breath. Patient has history of COPD. Patient arrived on 15L nonrebreather. Per EMS, patient's RA sat on scene was 88%. Patient's grandson who lives in her house works with a person who tested positive for COVID-19. Patient denies fever and states no one in her house has had fever. MD at bedside.    Allergies Allergies  Allergen Reactions  . Ezetimibe     Other reaction(s): Unknown  . Oxycodone-Acetaminophen     Other reaction(s): Vomiting  . Statins     Other reaction(s): Other (See Comments)    Level of Care/Admitting Diagnosis ED Disposition    ED Disposition Condition Comment   Admit  Hospital Area: [facility] [[id number]]  Level of Care: Telemetry [5]  Diagnosis: Acute on chronic systolic CHF (congestive heart failure) [facility]) [[id number]]  Admitting Physician: Oralia Manis [[id number]]  Attending Physician: Oralia Manis [phone]  Estimated length of stay: past midnight tomorrow  Certification:: I certify this patient will need inpatient services for at least 2 midnights  Possible Covid Disease Patient Isolation: N/A  PT Class (Do Not Modify): Inpatient [101]  PT Acc Code (Do Not Modify): Private [1]       B Medical/Surgery History Past Medical History:  Diagnosis Date  . Anxiety   . CHF (congestive heart failure) (HCC)   . Diabetes (HCC)   . GERD (gastroesophageal reflux disease)   .  Hypertension   . Sleep apnea   . Stroke [facility])    Past Surgical History:  Procedure Laterality Date  . PARTIAL HYSTERECTOMY    . tubligation       A IV Location/Drains/Wounds Patient Lines/Drains/Airways Status   Active Line/Drains/Airways    Name:   Placement date:   Placement time:   Site:   Days:   Peripheral IV 01/09/19 Right Antecubital   01/09/19    1855    Antecubital   less than 1          Intake/Output Last 24 hours  Intake/Output Summary (Last 24 hours) at 01/09/2019 2234 Last data filed at 01/09/2019 2051 Gross per 24 hour  Intake 586.93 ml  Output -  Net 586.93 ml    Labs/Imaging Results for orders placed or performed during the hospital encounter of 01/09/19 (from the past 48 hour(s))  Comprehensive metabolic panel     Status: Abnormal   Collection Time: 01/09/19  6:38 PM  Result Value Ref Range   Sodium 138 135 - 145 mmol/L   Potassium 3.8 3.5 - 5.1 mmol/L   Chloride 100 98 - 111 mmol/L   CO2 25 22 - 32 mmol/L   Glucose, Bld 134 (H) 70 - 99 mg/dL   BUN 17 8 - 23 mg/dL   Creatinine, Ser 3.00 0.44 - 1.00 mg/dL   Calcium 8.6 (L) 8.9 - 10.3 mg/dL   Total Protein 7.4 6.5 - 8.1 g/dL   Albumin 3.7 3.5 - 5.0 g/dL   AST  34 15 - 41 U/L   ALT 23 0 - 44 U/L   Alkaline Phosphatase 74 38 - 126 U/L   Total Bilirubin 1.3 (H) 0.3 - 1.2 mg/dL   GFR calc non Af Amer >60 >60 mL/min   GFR calc Af Amer >60 >60 mL/min   Anion gap 13 5 - 15    Comment: Performed at [facility], [address] [place], [place] [address]  Troponin I - Once     Status: Abnormal   Collection Time: 01/09/19  6:38 PM  Result Value Ref Range   Troponin I 0.04 (HH) <0.03 ng/mL    Comment: CRITICAL RESULT CALLED TO, READ BACK BY AND VERIFIED WITH FELICA Charlis Harner @ 1950 ON 01/09/19 BY JUW Performed at [facility], [address] [place], [place] [address]   CBC with Differential     Status: Abnormal   Collection Time: 01/09/19  6:38 PM  Result Value Ref Range   WBC 12.7  (H) 4.0 - 10.5 K/uL   RBC 4.42 3.87 - 5.11 MIL/uL   Hemoglobin 12.3 12.0 - 15.0 g/dL   HCT 09.8 11.9 - 14.7 %   MCV 88.0 80.0 - 100.0 fL   MCH 27.8 26.0 - 34.0 pg   MCHC 31.6 30.0 - 36.0 g/dL   RDW 82.9 56.2 - 13.0 %   Platelets 238 150 - 400 K/uL   nRBC 0.0 0.0 - 0.2 %   Neutrophils Relative % 88 %   Neutro Abs 11.2 (H) 1.7 - 7.7 K/uL   Lymphocytes Relative 5 %   Lymphs Abs 0.6 (L) 0.7 - 4.0 K/uL   Monocytes Relative 6 %   Monocytes Absolute 0.7 0.1 - 1.0 K/uL   Eosinophils Relative 0 %   Eosinophils Absolute 0.0 0.0 - 0.5 K/uL   Basophils Relative 0 %   Basophils Absolute 0.0 0.0 - 0.1 K/uL   WBC Morphology MORPHOLOGY UNREMARKABLE    RBC Morphology MORPHOLOGY UNREMARKABLE    Smear Review Normal platelet morphology    Immature Granulocytes 1 %   Abs Immature Granulocytes 0.06 0.00 - 0.07 K/uL    Comment: Performed at [facility], [address] [place], [place] [address]  Brain natriuretic peptide     Status: Abnormal   Collection Time: 01/09/19  6:38 PM  Result Value Ref Range   B Natriuretic Peptide 1,390.0 (H) 0.0 - 100.0 pg/mL    Comment: Performed at [facility], [address] [place], [place] [address]  Lactic acid, plasma     Status: Abnormal   Collection Time: 01/09/19  6:38 PM  Result Value Ref Range   Lactic Acid, Venous 2.0 (HH) 0.5 - 1.9 mmol/L    Comment: CRITICAL RESULT CALLED TO, READ BACK BY AND VERIFIED WITH FELICA Azalee Weimer @ 1950 ON 01/09/19 BY JUW Performed at [facility], [address] [place], [place] [address]   Procalcitonin     Status: None   Collection Time: 01/09/19  6:39 PM  Result Value Ref Range   Procalcitonin 0.25 ng/mL    Comment:        Interpretation: PCT (Procalcitonin) <= 0.5 ng/mL: Systemic infection (sepsis) is not likely. Local bacterial infection is possible. (NOTE)       Sepsis PCT Algorithm           Lower Respiratory Tract  Infection PCT Algorithm     ----------------------------     ----------------------------         PCT < 0.25 ng/mL                PCT < 0.10 ng/mL         Strongly encourage             Strongly discourage   discontinuation of antibiotics    initiation of antibiotics    ----------------------------     -----------------------------       PCT 0.25 - 0.50 ng/mL            PCT 0.10 - 0.25 ng/mL               OR       >80% decrease in PCT            Discourage initiation of                                            antibiotics      Encourage discontinuation           of antibiotics    ----------------------------     -----------------------------         PCT >= 0.50 ng/mL              PCT 0.26 - 0.50 ng/mL               AND        <80% decrease in PCT             Encourage initiation of                                             antibiotics       Encourage continuation           of antibiotics    ----------------------------     -----------------------------        PCT >= 0.50 ng/mL                  PCT > 0.50 ng/mL               AND         increase in PCT                  Strongly encourage                                      initiation of antibiotics    Strongly encourage escalation           of antibiotics                                     -----------------------------                                           PCT <= 0.25 ng/mL  OR                                        > 80% decrease in PCT                                     Discontinue / Do not initiate                                             antibiotics Performed at [facility], [address] [place], [place] [address]   APTT     Status: None   Collection Time: 01/09/19  6:39 PM  Result Value Ref Range   aPTT 34 24 - 36 seconds    Comment: Performed at [facility], [address] [place], [place] [address]  Protime-INR     Status: Abnormal   Collection Time: 01/09/19   6:39 PM  Result Value Ref Range   Prothrombin Time 19.4 (H) 11.4 - 15.2 seconds   INR 1.7 (H) 0.8 - 1.2    Comment: (NOTE) INR goal varies based on device and disease states. Performed at [facility], [address] [place], [place] [address]   SARS Coronavirus 2 [facility] order, Performed in [facility] hospital lab)     Status: None   Collection Time: 01/09/19  6:39 PM  Result Value Ref Range   SARS Coronavirus 2 NEGATIVE NEGATIVE    Comment: (NOTE) If result is NEGATIVE SARS-CoV-2 target nucleic acids are NOT DETECTED. The SARS-CoV-2 RNA is generally detectable in upper and lower  respiratory specimens during the acute phase of infection. The lowest  concentration of SARS-CoV-2 viral copies this assay can detect is 250  copies / mL. A negative result does not preclude SARS-CoV-2 infection  and should not be used as the sole basis for treatment or other  patient management decisions.  A negative result may occur with  improper specimen collection / handling, submission of specimen other  than nasopharyngeal swab, presence of viral mutation(s) within the  areas targeted by this assay, and inadequate number of viral copies  (<250 copies / mL). A negative result must be combined with clinical  observations, patient history, and epidemiological information. If result is POSITIVE SARS-CoV-2 target nucleic acids are DETECTED. The SARS-CoV-2 RNA is generally detectable in upper and lower  respiratory specimens dur ing the acute phase of infection.  Positive  results are indicative of active infection with SARS-CoV-2.  Clinical  correlation with patient history and other diagnostic information is  necessary to determine patient infection status.  Positive results do  not rule out bacterial infection or co-infection with other viruses. If result is PRESUMPTIVE POSTIVE SARS-CoV-2 nucleic acids MAY BE PRESENT.   A presumptive positive result was obtained on the submitted  specimen  and confirmed on repeat testing.  While 2019 novel coronavirus  (SARS-CoV-2) nucleic acids may be present in the submitted sample  additional confirmatory testing may be necessary for epidemiological  and / or clinical management purposes  to differentiate between  SARS-CoV-2 and other Sarbecovirus currently known to infect humans.  If clinically indicated additional testing with an alternate test  methodology [phone]) is advised. The SARS-CoV-2  RNA is generally  detectable in upper and lower respiratory sp ecimens during the acute  phase of infection. The expected result is Negative. Fact Sheet for Patients:  BoilerBrush.com.cy Fact Sheet for Healthcare Providers: https://pope.com/ This test is not yet approved or cleared by the [place] FDA and has been authorized for detection and/or diagnosis of SARS-CoV-2 by FDA under an Emergency Use Authorization (EUA).  This EUA will remain in effect (meaning this test can be used) for the duration of the COVID-19 declaration under Section 564(b)(1) of the Act, 21 U.S.C. section 360bbb-3(b)(1), unless the authorization is terminated or revoked sooner. Performed at [facility], [address] [place], [place] [address]    Dg Chest Portable 1 View  Result Date: 01/09/2019 CLINICAL DATA:  Cough and dyspnea EXAM: PORTABLE CHEST 1 VIEW COMPARISON:  01/13/2018 FINDINGS: Cardiac shadow is enlarged. Lungs are well aerated bilaterally with evidence of vascular congestion and early interstitial edema. No focal confluent infiltrate is seen. Small right-sided pleural effusion is noted. No bony abnormality is noted. IMPRESSION: Changes consistent with mild CHF. Electronically Signed   By: Alcide Clever M.D.   On: 01/09/2019 19:28    Pending Labs Unresulted Labs (From admission, onward)    Start     Ordered   01/09/19 2145  Lactic acid, plasma  Once,   STAT     01/09/19 2144    01/09/19 2140  Respiratory Panel by PCR  (Respiratory virus panel with precautions)  Add-on,   AD     01/09/19 2140   01/09/19 1811  Lactic acid, plasma  STAT Now then every 3 hours,   STAT     01/09/19 1811   01/09/19 1811  Blood Culture (routine x 2)  BLOOD CULTURE X 2,   STAT     01/09/19 1811   01/09/19 1811  Urinalysis, Complete w Microscopic  ONCE - STAT,   STAT     01/09/19 1811   01/09/19 1811  Urine culture  ONCE - STAT,   STAT     01/09/19 1811   Signed and Held  HIV antibody (Routine Testing)  Once,   R     Signed and Held   Signed and Held  CBC  (enoxaparin (LOVENOX)    CrCl >/= 30 ml/min)  Once,   R    Comments:  Baseline for enoxaparin therapy IF NOT ALREADY DRAWN.  Notify MD if PLT < 100 K.    Signed and Held   Signed and Held  Creatinine, serum  (enoxaparin (LOVENOX)    CrCl >/= 30 ml/min)  Once,   R    Comments:  Baseline for enoxaparin therapy IF NOT ALREADY DRAWN.    Signed and Held   Signed and Held  Creatinine, serum  (enoxaparin (LOVENOX)    CrCl >/= 30 ml/min)  Weekly,   R    Comments:  while on enoxaparin therapy    Signed and Held   Signed and Held  Basic metabolic panel  Tomorrow morning,   R     Signed and Held   Signed and Held  CBC  Tomorrow morning,   R     Signed and Held          Vitals/Pain Today's Vitals   01/09/19 1945 01/09/19 2000 01/09/19 2015 01/09/19 2030  BP:  (!) 149/85  (!) 126/112  Pulse: (!) 101 (!) 102  (!) 129  Resp: 19 (!) 26 17 (!) 22  Temp:  TempSrc:      SpO2: 99% 99%  99%  Weight:      Height:      PainSc:        Isolation Precautions Droplet precaution  Medications Medications  ceFEPIme (MAXIPIME) 2 g in sodium chloride 0.9 % 100 mL IVPB (0 g Intravenous Stopped 01/09/19 2012)  vancomycin (VANCOCIN) 2,000 mg in sodium chloride 0.9 % 500 mL IVPB (0 mg Intravenous Stopped 01/09/19 2051)  furosemide (LASIX) injection 20 mg (20 mg Intravenous Given 01/09/19 2112)  haloperidol lactate (HALDOL) injection 2 mg (2 mg  Intravenous Given 01/09/19 2109)  haloperidol lactate (HALDOL) injection 5 mg (5 mg Intravenous Given 01/09/19 2229)    Mobility walks Moderate fall risk   Focused Assessments Neuro Assessment Handoff:  Swallow screen pass? Yes  Cardiac Rhythm: Sinus tachycardia       Neuro Assessment:   Neuro Checks:      Last Documented NIHSS Modified Score:   Has TPA been given? No If patient is a Neuro Trauma and patient is going to OR before floor call report to 4N Charge nurse: [phone] or [phone]     R Recommendations: See Admitting Provider Note  Report given to: Alvester Morin, RN  Additional Notes:  Pt is at baseline demented and lives with her grandson. She knows where she is and the place but doesn't fully comprehend how sick she is. We have spoken with her daughter Leah Ritter regarding her care and she is attempting to get HCPOA.

## 2019-01-09 NOTE — ED Triage Notes (Signed)
Patient from home via [facility]. Per EMS, patient has had worsening SOB for the last 2 days. States this morning she woke up and was unable to speak in complete sentences or exert herself without losing her breath. Patient has history of COPD. Patient arrived on 15L nonrebreather. Per EMS, patient's RA sat on scene was 88%. Patient's grandson who lives in her house works with a person who tested positive for COVID-19. Patient denies fever and states no one in her house has had fever. MD at bedside.

## 2019-01-09 NOTE — ED Notes (Signed)
Pt removed her IV, all of the vital signs equipment and put all of her clothes on stating that she was going home. Scotty Court, MD made aware.

## 2019-01-10 LAB — GLUCOSE, CAPILLARY
Glucose-Capillary: 58 mg/dL — ABNORMAL LOW (ref 70–99)
Glucose-Capillary: 74 mg/dL (ref 70–99)
Glucose-Capillary: 75 mg/dL (ref 70–99)
Glucose-Capillary: 84 mg/dL (ref 70–99)

## 2019-01-10 LAB — CBC
HCT: 37.6 % (ref 36.0–46.0)
Hemoglobin: 12.1 g/dL (ref 12.0–15.0)
MCH: 28.3 pg (ref 26.0–34.0)
MCHC: 32.2 g/dL (ref 30.0–36.0)
MCV: 88.1 fL (ref 80.0–100.0)
Platelets: 210 10*3/uL (ref 150–400)
RBC: 4.27 MIL/uL (ref 3.87–5.11)
RDW: 15.1 % (ref 11.5–15.5)
WBC: 10.2 10*3/uL (ref 4.0–10.5)
nRBC: 0 % (ref 0.0–0.2)

## 2019-01-10 LAB — BASIC METABOLIC PANEL
Anion gap: 12 (ref 5–15)
BUN: 15 mg/dL (ref 8–23)
CO2: 28 mmol/L (ref 22–32)
Calcium: 8.9 mg/dL (ref 8.9–10.3)
Chloride: 100 mmol/L (ref 98–111)
Creatinine, Ser: 0.75 mg/dL (ref 0.44–1.00)
GFR calc Af Amer: >60 mL/min (ref >60)
GFR calc non Af Amer: >60 mL/min (ref >60)
Glucose, Bld: 50 mg/dL — ABNORMAL LOW (ref 70–99)
Potassium: 3.1 mmol/L — ABNORMAL LOW (ref 3.5–5.1)
Sodium: 140 mmol/L (ref 135–145)

## 2019-01-10 LAB — URINALYSIS, COMPLETE (UACMP) WITH MICROSCOPIC
Bacteria, UA: NONE SEEN
Bilirubin Urine: NEGATIVE
Glucose, UA: NEGATIVE mg/dL
Ketones, ur: NEGATIVE mg/dL
Leukocytes,Ua: NEGATIVE
Nitrite: NEGATIVE
Protein, ur: NEGATIVE mg/dL
Specific Gravity, Urine: 1.004 — ABNORMAL LOW (ref 1.005–1.030)
pH: 5 (ref 5.0–8.0)

## 2019-01-10 LAB — MAGNESIUM: Magnesium: 1.8 mg/dL (ref 1.7–2.4)

## 2019-01-10 LAB — MRSA PCR SCREENING: MRSA by PCR: NEGATIVE

## 2019-01-10 MED ORDER — ONDANSETRON HCL 4 MG/2ML IJ SOLN: 4.0000 mg | Freq: Four times a day (QID) | INTRAMUSCULAR | Status: DC | PRN

## 2019-01-10 MED ORDER — POTASSIUM CHLORIDE CRYS ER 20 MEQ PO TBCR
40.0000 meq | EXTENDED_RELEASE_TABLET | Freq: Once | ORAL | Status: DC
  Filled 2019-01-10: qty 2

## 2019-01-10 MED ORDER — ONDANSETRON HCL 4 MG PO TABS: 4.0000 mg | ORAL_TABLET | Freq: Four times a day (QID) | ORAL | Status: DC | PRN

## 2019-01-10 MED ORDER — ACETAMINOPHEN 650 MG RE SUPP: 650.0000 mg | Freq: Four times a day (QID) | RECTAL | Status: DC | PRN

## 2019-01-10 MED ORDER — FUROSEMIDE 20 MG PO TABS: 20.0000 mg | ORAL_TABLET | Freq: Every day | ORAL | Status: DC

## 2019-01-10 MED ORDER — MAGNESIUM SULFATE 2 GM/50ML IV SOLN
2.0000 g | Freq: Once | INTRAVENOUS | Status: DC
  Filled 2019-01-10: qty 50

## 2019-01-10 MED ORDER — POTASSIUM CHLORIDE CRYS ER 20 MEQ PO TBCR
40.0000 meq | EXTENDED_RELEASE_TABLET | Freq: Once | ORAL | Status: AC
  Administered 2019-01-10: 40 meq via ORAL
  Filled 2019-01-10: qty 2

## 2019-01-10 MED ORDER — SODIUM CHLORIDE 0.9 % IV SOLN
INTRAVENOUS | Status: DC | PRN
  Administered 2019-01-10: 250 mL via INTRAVENOUS

## 2019-01-10 MED ORDER — POTASSIUM CHLORIDE ER 10 MEQ PO TBCR: 10.0000 meq | EXTENDED_RELEASE_TABLET | Freq: Every day | ORAL | 0 refills | Status: DC

## 2019-01-10 MED ORDER — ENOXAPARIN SODIUM 40 MG/0.4ML ~~LOC~~ SOLN: 40.0000 mg | SUBCUTANEOUS | Status: DC

## 2019-01-10 MED ORDER — INSULIN ASPART 100 UNIT/ML ~~LOC~~ SOLN: 0.0000 [IU] | Freq: Three times a day (TID) | SUBCUTANEOUS | Status: DC

## 2019-01-10 MED ORDER — ACETAMINOPHEN 325 MG PO TABS: 650.0000 mg | ORAL_TABLET | Freq: Four times a day (QID) | ORAL | Status: DC | PRN

## 2019-01-10 NOTE — Care Management Obs Status (Signed)
MEDICARE OBSERVATION STATUS NOTIFICATION   Patient Details  Name: Leah Ritter MRN: [id number] Date of Birth: [birth year]   Medicare Observation Status Notification Given:  Yes    [name] 01/10/2019, 2:48 PM

## 2019-01-10 NOTE — Discharge Instructions (Signed)
Shortness of Breath, Adult  Shortness of breath is when a person has trouble breathing enough air or when a person feels like she or he is having trouble breathing in enough air. Shortness of breath could be a sign of a medical problem.  Follow these instructions at home:     Pay attention to any changes in your symptoms.   Do not use any products that contain nicotine or tobacco, such as cigarettes, e-cigarettes, and chewing tobacco.   Do not smoke. Smoking is a common cause of shortness of breath. If you need help quitting, ask your health care provider.   Avoid things that can irritate your airways, such as:  ? Mold.  ? Dust.  ? Air pollution.  ? Chemical fumes.  ? Things that can cause allergy symptoms (allergens), if you have allergies.   Keep your living space clean and free of mold and dust.   Rest as needed. Slowly return to your usual activities.   Take over-the-counter and prescription medicines only as told by your health care provider. This includes oxygen therapy and inhaled medicines.   Keep all follow-up visits as told by your health care provider. This is important.  Contact a health care provider if:   Your condition does not improve as soon as expected.   You have a hard time doing your normal activities, even after you rest.   You have new symptoms.  Get help right away if:   Your shortness of breath gets worse.   You have shortness of breath when you are resting.   You feel light-headed or you faint.   You have a cough that is not controlled with medicines.   You cough up blood.   You have pain with breathing.   You have pain in your chest, arms, shoulders, or abdomen.   You have a fever.   You cannot walk up stairs or exercise the way that you normally do.  These symptoms may represent a serious problem that is an emergency. Do not wait to see if the symptoms will go away. Get medical help right away. Call your local emergency services (911 in the [place] Do not drive yourself  to the hospital.  Summary   Shortness of breath is when a person has trouble breathing enough air. It can be a sign of a medical problem.   Avoid things that irritate your lungs, such as smoking, pollution, mold, and dust.   Pay attention to changes in your symptoms and contact your health care provider if you have a hard time completing daily activities because of shortness of breath.  This information is not intended to replace advice given to you by your health care provider. Make sure you discuss any questions you have with your health care provider.  Document Released: 06/08/2001 Document Revised: 02/13/2018 Document Reviewed: 02/13/2018  Elsevier Interactive Patient Education  2019 [employer]

## 2019-01-10 NOTE — Progress Notes (Signed)
Notified Dr. Arville Care about K+ 3.1. Ordered oral supplement and add on Magnesium lab. If magnesium less than 2, RN to order 2gm IV Magnesium Sulfate once.

## 2019-01-10 NOTE — Progress Notes (Signed)
Patient appears stable from a cardiac standpoint.  Is refusing all medications.  Would consider discharge with outpatient follow-up.  Continue current medications which she was on prior to admission.  No further inpatient cardiac work-up indicated.

## 2019-01-10 NOTE — Discharge Summary (Signed)
[facility] - [place] at [facility]   PATIENT NAME: Leah Ritter    MR#:  [id number]  DATE OF BIRTH:  09-Oct-1950  DATE OF ADMISSION:  01/09/2019 ADMITTING PHYSICIAN: Oralia Manis, MD  DATE OF DISCHARGE: 01/10/19  PRIMARY CARE PHYSICIAN: [place], [facility]    ADMISSION DIAGNOSIS:  Acute on chronic combined systolic and diastolic congestive heart failure (HCC) [I50.43] Dementia without behavioral disturbance, unspecified dementia type (HCC) [F03.90] Sepsis, due to unspecified organism, unspecified whether acute organ dysfunction present (HCC) [A41.9]  DISCHARGE DIAGNOSIS:  Principal Problem:   Acute on chronic systolic CHF (congestive heart failure) (HCC) Active Problems:   Diabetes (HCC)   HLD (hyperlipidemia)   HYPERTENSION, BENIGN SYSTEMIC   GERD (gastroesophageal reflux disease)   SIRS (systemic inflammatory response syndrome) (HCC)   SECONDARY DIAGNOSIS:   Past Medical History:  Diagnosis Date  . Anxiety   . CHF (congestive heart failure) (HCC)   . Diabetes (HCC)   . GERD (gastroesophageal reflux disease)   . Hypertension   . Sleep apnea   . Stroke [facility])     HOSPITAL COURSE:   Patient was admitted for further cardiac work-up.  Her shortness of breath much improved the next day.  Coronavirus testing was negative.  UA was negative.  She was feeling completely back to baseline.  Respiratory panel was positive for RSV.  Patient was refusing for any further test or even medications due to her underlying dementia.  But she was comfortable.  Spoke to patient's family that there is no new testing or management plan needed over here and she would feel comfortable in her familiar environment due to her baseline dementia.  Discharged home the next day.  DISCHARGE CONDITIONS:   Stable.  CONSULTS OBTAINED:  Treatment Team:  Dalia Heading, MD  DRUG ALLERGIES:   Allergies  Allergen Reactions  . Oxycodone-Acetaminophen Nausea And Vomiting     Other reaction(s): Vomiting  . Ezetimibe     Other reaction(s): Unknown  . Statins     Other reaction(s): Other (See Comments)    DISCHARGE MEDICATIONS:   Allergies as of 01/10/2019      Reactions   Oxycodone-acetaminophen Nausea And Vomiting   Other reaction(s): Vomiting   Ezetimibe    Other reaction(s): Unknown   Statins    Other reaction(s): Other (See Comments)      Medication List    STOP taking these medications   cyclobenzaprine 5 MG tablet Commonly known as:  FLEXERIL   tiotropium 18 MCG inhalation capsule Commonly known as:  SPIRIVA     TAKE these medications   amLODipine 5 MG tablet Commonly known as:  NORVASC amlodipine 5 mg tablet   apixaban 5 MG Tabs tablet Commonly known as:  ELIQUIS Take 5 mg by mouth 2 (two) times daily.   aspirin EC 81 MG tablet Take 81 mg by mouth daily.   atorvastatin 80 MG tablet Commonly known as:  LIPITOR 80 mg daily at 6 PM.   Cholecalciferol 25 MCG (1000 UT) tablet Take 1,000 Units by mouth daily.   Contour Next Test test strip Generic drug:  glucose blood CHECK BLOOD SUGAR TID UTD   donepezil 5 MG tablet Commonly known as:  ARICEPT Take 5 mg by mouth at bedtime.   fluticasone 50 MCG/ACT nasal spray Commonly known as:  FLONASE   Fluticasone-Umeclidin-Vilant 100-62.5-25 MCG/INH Aepb Commonly known as:  Trelegy Ellipta Inhale 1 puff into the lungs daily.   furosemide 20 MG tablet Commonly known  as:  LASIX Take 40 mg by mouth daily.   glipiZIDE 10 MG tablet Commonly known as:  GLUCOTROL Take 10 mg by mouth 2 (two) times daily before a meal.   losartan 100 MG tablet Commonly known as:  COZAAR Take 100 mg by mouth daily.   metoprolol succinate 100 MG 24 hr tablet Commonly known as:  TOPROL-XL Take 100 mg by mouth daily.   metoprolol succinate 50 MG 24 hr tablet Commonly known as:  TOPROL-XL Take 50 mg by mouth daily.   mirtazapine 15 MG disintegrating tablet Commonly known as:  REMERON  SOL-TAB Take 15 mg by mouth at bedtime.   potassium chloride 10 MEQ tablet Commonly known as:  K-DUR Take 1 tablet (10 mEq total) by mouth daily.   sertraline 50 MG tablet Commonly known as:  ZOLOFT sertraline 50 mg tablet   Travatan Z 0.004 % Soln ophthalmic solution Generic drug:  Travoprost (BAK Free)   Ventolin HFA 108 (90 Base) MCG/ACT inhaler Generic drug:  albuterol INL 2 INHALATIONS ITL Q 4 H PRF WHZ   Vitamin D (Ergocalciferol) 50 MCG (2000 UT) Caps Take by mouth.        DISCHARGE INSTRUCTIONS:    Follow with primary care physician in 1 week.  If you experience worsening of your admission symptoms, develop shortness of breath, life threatening emergency, suicidal or homicidal thoughts you must seek medical attention immediately by calling 911 or calling your MD immediately  if symptoms less severe.  You Must read complete instructions/literature along with all the possible adverse reactions/side effects for all the Medicines you take and that have been prescribed to you. Take any new Medicines after you have completely understood and accept all the possible adverse reactions/side effects.   Please note  You were cared for by a hospitalist during your hospital stay. If you have any questions about your discharge medications or the care you received while you were in the hospital after you are discharged, you can call the unit and asked to speak with the hospitalist on call if the hospitalist that took care of you is not available. Once you are discharged, your primary care physician will handle any further medical issues. Please note that NO REFILLS for any discharge medications will be authorized once you are discharged, as it is imperative that you return to your primary care physician (or establish a relationship with a primary care physician if you do not have one) for your aftercare needs so that they can reassess your need for medications and monitor your lab  values.    Today   CHIEF COMPLAINT:   Chief Complaint  Patient presents with  . Shortness of Breath    HISTORY OF PRESENT ILLNESS:  Leah Ritter  is a 68 y.o. female presents to the ED with malaise and shortness of breath.  She has dementia and is unable to provide accurate information for her history.  On arrival to ED she was febrile and tachycardic.  Work-up here is inconsistent with bacterial infection, with no clear pneumonia on chest x-ray or strong suspicion of urine infection based on UA.  Procalcitonin was low.  Coronavirus testing was negative.  Patient does have a history of heart failure and has some mild edema on her x-ray.  Respiratory panel was positive for RSV.  Hospitalist were called for admission.   VITAL SIGNS:  Blood pressure (!) 148/94, pulse (!) 115, temperature 98.6 F (37 C), temperature source Oral, resp. rate (!) 24, height 5'  4" (1.626 m), weight 90.7 kg, SpO2 94 %.  I/O:    Intake/Output Summary (Last 24 hours) at 01/10/2019 1347 Last data filed at 01/10/2019 1610 Gross per 24 hour  Intake 1066.93 ml  Output 2100 ml  Net -1033.07 ml    PHYSICAL EXAMINATION:  GENERAL:  68 y.o.-year-old patient lying in the bed with no acute distress.  EYES: Pupils equal, round, reactive to light and accommodation. No scleral icterus. Extraocular muscles intact.  HEENT: Head atraumatic, normocephalic. Oropharynx and nasopharynx clear.  NECK:  Supple, no jugular venous distention. No thyroid enlargement, no tenderness.  LUNGS: Normal breath sounds bilaterally, no wheezing, rales,rhonchi or crepitation. No use of accessory muscles of respiration.  CARDIOVASCULAR: S1, S2 normal. No murmurs, rubs, or gallops.  ABDOMEN: Soft, non-tender, non-distended. Bowel sounds present. No organomegaly or mass.  EXTREMITIES: No pedal edema, cyanosis, or clubbing.  NEUROLOGIC: Cranial nerves II through XII are intact. Muscle strength 5/5 in all extremities. Sensation intact. Gait not  checked.  PSYCHIATRIC: The patient is alert and oriented x 0.  SKIN: No obvious rash, lesion, or ulcer.   DATA REVIEW:   CBC Recent Labs  Lab 01/10/19 0302  WBC 10.2  HGB 12.1  HCT 37.6  PLT 210    Chemistries  Recent Labs  Lab 01/09/19 1838 01/10/19 0302  NA 138 140  K 3.8 3.1*  CL 100 100  CO2 25 28  GLUCOSE 134* 50*  BUN 17 15  CREATININE 0.82 0.75  CALCIUM 8.6* 8.9  MG  --  1.8  AST 34  --   ALT 23  --   ALKPHOS 74  --   BILITOT 1.3*  --     Cardiac Enzymes Recent Labs  Lab 01/09/19 1838  TROPONINI 0.04*    Microbiology Results  Results for orders placed or performed during the hospital encounter of 01/09/19  Blood Culture (routine x 2)     Status: None (Preliminary result)   Collection Time: 01/09/19  6:39 PM  Result Value Ref Range Status   Specimen Description BLOOD RIGHT ANTECUBITAL  Final   Special Requests   Final    BOTTLES DRAWN AEROBIC AND ANAEROBIC Blood Culture adequate volume   Culture   Final    NO GROWTH < 24 HOURS Performed at [facility], [address] [place], [place] [address]    Report Status PENDING  Incomplete  SARS Coronavirus 2 [facility] order, Performed in [facility] hospital lab)     Status: None   Collection Time: 01/09/19  6:39 PM  Result Value Ref Range Status   SARS Coronavirus 2 NEGATIVE NEGATIVE Final    Comment: (NOTE) If result is NEGATIVE SARS-CoV-2 target nucleic acids are NOT DETECTED. The SARS-CoV-2 RNA is generally detectable in upper and lower  respiratory specimens during the acute phase of infection. The lowest  concentration of SARS-CoV-2 viral copies this assay can detect is 250  copies / mL. A negative result does not preclude SARS-CoV-2 infection  and should not be used as the sole basis for treatment or other  patient management decisions.  A negative result may occur with  improper specimen collection / handling, submission of specimen other  than nasopharyngeal swab, presence of  viral mutation(s) within the  areas targeted by this assay, and inadequate number of viral copies  (<250 copies / mL). A negative result must be combined with clinical  observations, patient history, and epidemiological information. If result is POSITIVE SARS-CoV-2 target nucleic acids are DETECTED. The  SARS-CoV-2 RNA is generally detectable in upper and lower  respiratory specimens dur ing the acute phase of infection.  Positive  results are indicative of active infection with SARS-CoV-2.  Clinical  correlation with patient history and other diagnostic information is  necessary to determine patient infection status.  Positive results do  not rule out bacterial infection or co-infection with other viruses. If result is PRESUMPTIVE POSTIVE SARS-CoV-2 nucleic acids MAY BE PRESENT.   A presumptive positive result was obtained on the submitted specimen  and confirmed on repeat testing.  While 2019 novel coronavirus  (SARS-CoV-2) nucleic acids may be present in the submitted sample  additional confirmatory testing may be necessary for epidemiological  and / or clinical management purposes  to differentiate between  SARS-CoV-2 and other Sarbecovirus currently known to infect humans.  If clinically indicated additional testing with an alternate test  methodology [phone]) is advised. The SARS-CoV-2 RNA is generally  detectable in upper and lower respiratory sp ecimens during the acute  phase of infection. The expected result is Negative. Fact Sheet for Patients:  BoilerBrush.com.cy Fact Sheet for Healthcare Providers: https://pope.com/ This test is not yet approved or cleared by the [place] FDA and has been authorized for detection and/or diagnosis of SARS-CoV-2 by FDA under an Emergency Use Authorization (EUA).  This EUA will remain in effect (meaning this test can be used) for the duration of the COVID-19 declaration under Section  564(b)(1) of the Act, 21 U.S.C. section 360bbb-3(b)(1), unless the authorization is terminated or revoked sooner. Performed at [facility], [address] [place], [place] [address]   Respiratory Panel by PCR     Status: Abnormal   Collection Time: 01/09/19  6:39 PM  Result Value Ref Range Status   Adenovirus NOT DETECTED NOT DETECTED Final   Coronavirus 229E NOT DETECTED NOT DETECTED Final    Comment: (NOTE) The Coronavirus on the Respiratory Panel, DOES NOT test for the novel  Coronavirus (2019 nCoV)    Coronavirus HKU1 NOT DETECTED NOT DETECTED Final   Coronavirus NL63 NOT DETECTED NOT DETECTED Final   Coronavirus OC43 NOT DETECTED NOT DETECTED Final   Metapneumovirus NOT DETECTED NOT DETECTED Final   Rhinovirus / Enterovirus NOT DETECTED NOT DETECTED Final   Influenza A NOT DETECTED NOT DETECTED Final   Influenza B NOT DETECTED NOT DETECTED Final   Parainfluenza Virus 1 NOT DETECTED NOT DETECTED Final   Parainfluenza Virus 2 NOT DETECTED NOT DETECTED Final   Parainfluenza Virus 3 NOT DETECTED NOT DETECTED Final   Parainfluenza Virus 4 NOT DETECTED NOT DETECTED Final   Respiratory Syncytial Virus DETECTED (A) NOT DETECTED Final    Comment: RESULT CALLED TO, READ BACK BY AND VERIFIED WITH: V ASHLEY RN 01/09/19 2314 JDW    Bordetella pertussis NOT DETECTED NOT DETECTED Final   Chlamydophila pneumoniae NOT DETECTED NOT DETECTED Final   Mycoplasma pneumoniae NOT DETECTED NOT DETECTED Final    Comment: Performed at [facility], [address], [place], [place] [address]  Blood Culture (routine x 2)     Status: None (Preliminary result)   Collection Time: 01/09/19  6:40 PM  Result Value Ref Range Status   Specimen Description BLOOD BLOOD RIGHT FOREARM  Final   Special Requests   Final    BOTTLES DRAWN AEROBIC AND ANAEROBIC Blood Culture adequate volume   Culture   Final    NO GROWTH < 24 HOURS Performed at [facility], [address] [place],  [place]  [address]    Report Status PENDING  Incomplete  MRSA PCR Screening     Status: None   Collection Time: 01/09/19 11:57 PM  Result Value Ref Range Status   MRSA by PCR NEGATIVE NEGATIVE Final    Comment:        The GeneXpert MRSA Assay (FDA approved for NASAL specimens only), is one component of a comprehensive MRSA colonization surveillance program. It is not intended to diagnose MRSA infection nor to guide or monitor treatment for MRSA infections. Performed at [facility], [address] [place], [place] [address]     RADIOLOGY:  Dg Chest Portable 1 View  Result Date: 01/09/2019 CLINICAL DATA:  Cough and dyspnea EXAM: PORTABLE CHEST 1 VIEW COMPARISON:  01/13/2018 FINDINGS: Cardiac shadow is enlarged. Lungs are well aerated bilaterally with evidence of vascular congestion and early interstitial edema. No focal confluent infiltrate is seen. Small right-sided pleural effusion is noted. No bony abnormality is noted. IMPRESSION: Changes consistent with mild CHF. Electronically Signed   By: Alcide Clever M.D.   On: 01/09/2019 19:28    EKG:   Orders placed or performed during the hospital encounter of 01/09/19  . ED EKG  . ED EKG      Management plans discussed with the patient, family and they are in agreement.  CODE STATUS:     Code Status Orders  (From admission, onward)         Start     Ordered   01/10/19 0014  Full code  Continuous     01/10/19 0014        Code Status History    This patient has a current code status but no historical code status.      TOTAL TIME TAKING CARE OF THIS PATIENT: 35 minutes.    Altamese Dilling M.D on 01/10/2019 at 1:47 PM  Between 7am to 6pm - Pager - [phone]  After 6pm go to [email or web address] - password [email or web address]  [facility]  Office  [phone]  CC: Primary care physician; [facility]   Note: This dictation was prepared with Dragon dictation along with smaller phrase  technology. Any transcriptional errors that result from this process are unintentional.

## 2019-01-10 NOTE — Progress Notes (Signed)
   Pt BMP glucose result 50. 8oz orange juice given. FSBS at recheck was 74.

## 2019-01-10 NOTE — Care Management CC44 (Signed)
Condition Code 44 Documentation Completed  Patient Details  Name: Leah Ritter MRN: [id number] Date of Birth: 07/01/1951   Condition Code 44 given:  Yes Patient signature on Condition Code 44 notice:  Yes Documentation of 2 MD's agreement:  Yes Code 44 added to claim:  Yes    Sherren Kerns, RN 01/10/2019, 2:50 PM

## 2019-01-10 NOTE — Progress Notes (Signed)
Patient is refusing any medications at this point including of K+ and 2g Mag Sulfate IV. I explained the importance of these medications for her immediate heart health and the patient continues to refuse.

## 2019-01-10 NOTE — Care Management Obs Status (Signed)
MEDICARE OBSERVATION STATUS NOTIFICATION   Patient Details  Name: Leah Ritter MRN: [id number] Date of Birth: 04/24/1951   Medicare Observation Status Notification Given:  Yes    [name] 01/10/2019, 2:47 PM

## 2019-01-10 NOTE — Plan of Care (Signed)
  Problem: Health Behavior/Discharge Planning: Goal: Ability to manage health-related needs will improve Outcome: Not Progressing Note:  Patient refusing medications and other treatments, for unknown reasons. Will continue to monitor. Jari Favre [facility]

## 2019-01-10 NOTE — Plan of Care (Signed)
Patient continues to be leery of medications. She says that she believes that she takes too many medications at home and that she thinks her oldest daughter may be poisoning her with her medications. I was able to gain patient's trust with K+ administration by showing her the lab results in Epic and explaining the dangers of low K+. She has stated multiple times that she needs to get home as soon as she can today because she has things to take care of there. I have explained that discharge will be up to her treatment team. VS mostly stable. One episode of vtach when the patient was using the bathroom. Running sinus tach most of the shift. Afebrile. UOP WDL.

## 2019-01-10 NOTE — Consult Note (Signed)
[facility] Cardiology  CARDIOLOGY CONSULT NOTE  Patient ID: Leah Ritter MRN: [id number] DOB/AGE: 1951/04/15 68 y.o.  Admit date: 01/09/2019 Referring Physician Dr. Oralia Manis  Primary Physician [facility]  Primary Cardiologist Dr. Gwen Pounds  Reason for Consultation Acute on chronic CHF   HPI: Leah Ritter is a 68 year old female with a past medical history significant for chronic systolic congestive heart failure, non-ischemic cardiomyopathy, paroxysmal atrial fibrillation, and hypertension who presented to the ED on 01/09/19 for shortness of breath. Workup in the ED was significant for chest x-ray revealing vascular congestion and early interstitial edema, BNP elevated at 1390, and troponin mildly elevated at .04.  SARS Covid test was negative, RSV was positive.  Leah Ritter is very demented and a poor historian, but denies any current chest pain or shortness of breath.  States that she takes too many medicines and is currently refusing medications in the hospital.   She is followed in outpatient cardiology by Dr. Juliann Pares.  Most recent echocardiogram on 02/03/18 revealed severe LV dysfunction with an EF estimated at 25% with moderate MR, mild PR, and trivial TR.   Review of systems complete and found to be negative unless listed above     Past Medical History:  Diagnosis Date  . Anxiety   . CHF (congestive heart failure) (HCC)   . Diabetes (HCC)   . GERD (gastroesophageal reflux disease)   . Hypertension   . Sleep apnea   . Stroke [facility])     Past Surgical History:  Procedure Laterality Date  . PARTIAL HYSTERECTOMY    . tubligation      Medications Prior to Admission  Medication Sig Dispense Refill Last Dose  . amLODipine (NORVASC) 5 MG tablet amlodipine 5 mg tablet   Taking  . apixaban (ELIQUIS) 5 MG TABS tablet Take by mouth.   Taking  . atorvastatin (LIPITOR) 10 MG tablet atorvastatin 10 mg tablet   Taking  . baclofen (LIORESAL) 10 MG tablet baclofen 10 mg tablet   Taking   . carvedilol (COREG) 25 MG tablet Take by mouth.   Taking  . Cholecalciferol 1000 units tablet Take 1,000 Units by mouth daily.   Taking  . clopidogrel (PLAVIX) 75 MG tablet Take 75 mg by mouth daily.   Taking  . CONTOUR NEXT TEST test strip CHECK BLOOD SUGAR TID UTD  4 Taking  . cyclobenzaprine (FLEXERIL) 5 MG tablet Take 5 mg by mouth 3 (three) times daily as needed for muscle spasms.   Taking  . fluticasone (FLONASE) 50 MCG/ACT nasal spray    Taking  . Fluticasone-Umeclidin-Vilant (TRELEGY ELLIPTA) 100-62.5-25 MCG/INH AEPB Inhale 1 puff into the lungs daily. 1 each 4 Taking  . furosemide (LASIX) 20 MG tablet Take by mouth.   Taking  . glipiZIDE (GLUCOTROL) 10 MG tablet Take 10 mg by mouth daily before breakfast.   Taking  . lisinopril (PRINIVIL,ZESTRIL) 5 MG tablet Take by mouth.   Taking  . losartan (COZAAR) 100 MG tablet Take 100 mg by mouth daily.   Taking  . meloxicam (MOBIC) 15 MG tablet meloxicam 15 mg tablet   Taking  . metFORMIN (GLUCOPHAGE) 500 MG tablet Take by mouth 2 (two) times daily with a meal.   Taking  . rosuvastatin (CRESTOR) 5 MG tablet Take 5 mg by mouth daily.   Taking  . sertraline (ZOLOFT) 50 MG tablet sertraline 50 mg tablet   Taking  . Travoprost, BAK Free, (TRAVATAN Z) 0.004 % SOLN ophthalmic solution  Taking  . VENTOLIN HFA 108 (90 Base) MCG/ACT inhaler INL 2 INHALATIONS ITL Q 4 H PRF WHZ  12 Taking  . Vitamin D, Ergocalciferol, 2000 units CAPS Take by mouth.   Taking   Social History   Socioeconomic History  . Marital status: Single    Spouse name: Not on file  . Number of children: Not on file  . Years of education: Not on file  . Highest education level: Not on file  Occupational History  . Not on file  Social Needs  . Financial resource strain: Not on file  . Food insecurity:    Worry: Not on file    Inability: Not on file  . Transportation needs:    Medical: Not on file    Non-medical: Not on file  Tobacco Use  . Smoking status: Never  Smoker  . Smokeless tobacco: Never Used  Substance and Sexual Activity  . Alcohol use: Not Currently  . Drug use: Never  . Sexual activity: Not Currently  Lifestyle  . Physical activity:    Days per week: Not on file    Minutes per session: Not on file  . Stress: Not on file  Relationships  . Social connections:    Talks on phone: Not on file    Gets together: Not on file    Attends religious service: Not on file    Active member of club or organization: Not on file    Attends meetings of clubs or organizations: Not on file    Relationship status: Not on file  . Intimate partner violence:    Fear of current or ex partner: Not on file    Emotionally abused: Not on file    Physically abused: Not on file    Forced sexual activity: Not on file  Other Topics Concern  . Not on file  Social History Narrative  . Not on file    Family History  Problem Relation Age of Onset  . Breast cancer Mother   . Stroke Father   . Hypertension Father   . Kidney disease Brother   . Cancer Brother       Review of systems complete and found to be negative unless listed above      PHYSICAL EXAM  General: Well developed, well nourished, confused, mildly agitated  HEENT:  Normocephalic and atramatic Neck:  No JVD.  Lungs: Bilateral inspiratory and expiratory wheezes noted in upper and lower lung fields  Heart: Tachycardic, regular rhythm. Normal S1 and S2 without gallops or murmurs.  Abdomen: Bowel sounds are positive, abdomen soft and non-tender  Msk:  Back normal. Normal strength and tone for age. Extremities: No clubbing, cyanosis or edema.   Neuro: Alert and oriented X person and place Psych:  Agitated, confused   Labs:   Lab Results  Component Value Date   WBC 10.2 01/10/2019   HGB 12.1 01/10/2019   HCT 37.6 01/10/2019   MCV 88.1 01/10/2019   PLT 210 01/10/2019    Recent Labs  Lab 01/09/19 1838 01/10/19 0302  NA 138 140  K 3.8 3.1*  CL 100 100  CO2 25 28  BUN 17 15   CREATININE 0.82 0.75  CALCIUM 8.6* 8.9  PROT 7.4  --   BILITOT 1.3*  --   ALKPHOS 74  --   ALT 23  --   AST 34  --   GLUCOSE 134* 50*   Lab Results  Component Value Date   TROPONINI 0.04 (HH)  01/09/2019   No results found for: CHOL No results found for: HDL No results found for: LDLCALC No results found for: TRIG No results found for: CHOLHDL No results found for: LDLDIRECT    Radiology: Dg Chest Portable 1 View  Result Date: 01/09/2019 CLINICAL DATA:  Cough and dyspnea EXAM: PORTABLE CHEST 1 VIEW COMPARISON:  01/13/2018 FINDINGS: Cardiac shadow is enlarged. Lungs are well aerated bilaterally with evidence of vascular congestion and early interstitial edema. No focal confluent infiltrate is seen. Small right-sided pleural effusion is noted. No bony abnormality is noted. IMPRESSION: Changes consistent with mild CHF. Electronically Signed   By: Alcide Clever M.D.   On: 01/09/2019 19:28    EKG:  Sinus tachycardia, rate 113bpm; no evidence of acute ischemic changes   ASSESSMENT AND PLAN:  1.  Acute on chronic congestive heart failure   -Stressed the importance of medication compliance - patient unsure which medications she's been taking   -Continue furosemide, lisinopril; recommend to resume beta blocker  2.  Hypokalemia (3.1)  -Potassium supplement recommended, but patient currently refusing all medications  3.  Paroxsymal atrial fibrillation   -Currently in sinus tachycardia, rate around 120-130  -Recommend resuming carvedilol, but patient refusing all medications   -Currently not anticoagulated and has apparently refused in the past    The history, physical exam findings, and plan of care were all discussed with Dr. Harold Hedge, and all decision making was made in collaboration.    Signed: Andi Hence PA-C 01/10/2019, 8:21 AM

## 2019-01-11 LAB — URINE CULTURE: Culture: <10000 — AB

## 2019-01-11 LAB — HIV ANTIBODY (ROUTINE TESTING W REFLEX): HIV Screen 4th Generation wRfx: NONREACTIVE

## 2019-01-14 LAB — CULTURE, BLOOD (ROUTINE X 2)
Culture: NO GROWTH
Culture: NO GROWTH
Special Requests: ADEQUATE
Special Requests: ADEQUATE

## 2019-01-17 ENCOUNTER — Ambulatory Visit: Payer: Medicare Other | Attending: Family | Admitting: Family

## 2019-01-17 ENCOUNTER — Telehealth: Payer: Self-pay

## 2019-01-17 ENCOUNTER — Other Ambulatory Visit: Payer: Self-pay

## 2019-01-17 ENCOUNTER — Encounter: Payer: Self-pay | Admitting: Family

## 2019-01-17 DIAGNOSIS — R059 Cough, unspecified: Secondary | ICD-10-CM

## 2019-01-17 DIAGNOSIS — I5022 Chronic systolic (congestive) heart failure: Secondary | ICD-10-CM

## 2019-01-17 DIAGNOSIS — I10 Essential (primary) hypertension: Secondary | ICD-10-CM

## 2019-01-17 DIAGNOSIS — R05 Cough: Secondary | ICD-10-CM

## 2019-01-17 NOTE — Patient Instructions (Signed)
Begin weighing daily and call for an overnight weight gain of > 2 pounds or a weekly weight gain of >5 pounds. 

## 2019-01-17 NOTE — Telephone Encounter (Signed)
TELEPHONE CALL NOTE  Leah Ritter has been deemed a candidate for a follow-up tele-health visit to limit community exposure during the Covid-19 pandemic. I spoke with the patient via phone to ensure availability of phone/video source, confirm preferred email & phone number, discuss instructions and expectations, and review consent.   I reminded Leah Ritter to be prepared with any vital sign and/or heart rhythm information that could potentially be obtained via home monitoring, at the time of her visit.  Finally, I reminded Leah Ritter to expect an e-mail containing a link for their video-based visit approximately 15 minutes before her visit, or alternatively, a phone call at the time of her visit if her visit is planned to be a phone encounter.  Did the patient verbally consent to treatment as below? YES  Derric Dealmeida L, CMA 01/17/2019 9:05 AM  CONSENT FOR TELE-HEALTH VISIT - PLEASE REVIEW  I hereby voluntarily request, consent and authorize [facility] and its employed or contracted physicians, physician assistants, nurse practitioners or other licensed health care professionals (the Practitioner), to provide me with telemedicine health care services (the "Services") as deemed necessary by the treating Practitioner. I acknowledge and consent to receive the Services by the Practitioner via telemedicine. I understand that the telemedicine visit will involve communicating with the Practitioner through telephonic communication technology and the disclosure of certain medical information by electronic transmission. I acknowledge that I have been given the opportunity to request an in-person assessment or other available alternative prior to the telemedicine visit and am voluntarily participating in the telemedicine visit.  I understand that I have the right to withhold or withdraw my consent to the use of telemedicine in the course of my care at any time, without affecting my right  to future care or treatment, and that the Practitioner or I may terminate the telemedicine visit at any time. I understand that I have the right to inspect all information obtained and/or recorded in the course of the telemedicine visit and may receive copies of available information for a reasonable fee.  I understand that some of the potential risks of receiving the Services via telemedicine include:  Marland Kitchen Delay or interruption in medical evaluation due to technological equipment failure or disruption; . Information transmitted may not be sufficient (e.g. poor resolution of images) to allow for appropriate medical decision making by the Practitioner; and/or  . In rare instances, security protocols could fail, causing a breach of personal health information.  Furthermore, I acknowledge that it is my responsibility to provide information about my medical history, conditions and care that is complete and accurate to the best of my ability. I acknowledge that Practitioner's advice, recommendations, and/or decision may be based on factors not within their control, such as incomplete or inaccurate data provided by me or lack of visual representation. I understand that the practice of medicine is not an exact science and that Practitioner makes no warranties or guarantees regarding treatment outcomes. I acknowledge that I will receive a copy of this consent concurrently upon execution via email to the email address I last provided but may also request a printed copy by calling the office of [facility].    I understand that my insurance may be billed for this visit.   I have read or had this consent read to me. . I understand the contents of this consent, which adequately explains the benefits and risks of the Services being provided via telemedicine.  . I have been  provided ample opportunity to ask questions regarding this consent and the Services and have had my questions answered to my  satisfaction. . I give my informed consent for the services to be provided through the use of telemedicine in my medical care  By participating in this telemedicine visit I agree to the above.

## 2019-01-17 NOTE — Progress Notes (Signed)
Virtual Visit via Telephone Note    Evaluation Performed:  Initial visit  This visit type was conducted due to national recommendations for restrictions regarding the COVID-19 Pandemic (e.g. social distancing).  This format is felt to be most appropriate for this patient at this time.  All issues noted in this document were discussed and addressed.  No physical exam was performed (except for noted visual exam findings with Video Visits).  Please refer to the patient's chart (MyChart message for video visits and phone note for telephone visits) for the patient's consent to telehealth for [facility]  Date:  01/17/2019   ID:  Leah Ritter, DOB 25-Jul-1951, MRN [id number]  Patient Location:  [place] [address] [facility] [place] [address]   Provider location:   [facility] [address] [place], [place] [address]  PCP:  [facility]  Cardiologist:  Bernestine Amass Electrophysiologist:  None   Chief Complaint:  Chronic cough  History of Present Illness:    Leah Ritter is a 68 y.o. female who presents via audio/video conferencing for a telehealth visit today.  Patient verified DOB and address.  The patient does not have symptoms concerning for COVID-19 infection (fever, chills, cough, or new SHORTNESS OF BREATH).   Patient reports a chronic cough that she describes as having been present for the last 3-4 years. She says that it's slightly productive in nature. She has associated fatigue along with this. She denies any dizziness, pedal edema, abdominal distention, palpitations, chest pain, shortness of breath, sore throat, difficulty sleeping or change in her appetite. She hasn't been weighing daily but says that she does have access to scales. Grandson read medication bottles to me. Does not wear CPAP at night.   Prior CV studies:   The following studies were reviewed today:  Echo report from 02/03/18 reviewed and showed an EF of 25% along with moderate MR  and trivial TR.   Past Medical History:  Diagnosis Date  . Anxiety   . CHF (congestive heart failure) (HCC)   . Diabetes (HCC)   . GERD (gastroesophageal reflux disease)   . Hypertension   . Sleep apnea   . Stroke [facility])    Past Surgical History:  Procedure Laterality Date  . PARTIAL HYSTERECTOMY    . tubligation       Current Meds  Medication Sig  . apixaban (ELIQUIS) 5 MG TABS tablet Take 5 mg by mouth daily.   Marland Kitchen aspirin EC 81 MG tablet Take 81 mg by mouth daily.  Marland Kitchen atorvastatin (LIPITOR) 80 MG tablet 80 mg daily at 6 PM.   . CONTOUR NEXT TEST test strip CHECK BLOOD SUGAR TID UTD  . donepezil (ARICEPT) 5 MG tablet Take 5 mg by mouth at bedtime.  . fluticasone (FLONASE) 50 MCG/ACT nasal spray   . furosemide (LASIX) 20 MG tablet Take 40 mg by mouth daily.   Marland Kitchen glipiZIDE (GLUCOTROL) 10 MG tablet Take 10 mg by mouth 2 (two) times daily before a meal.   . losartan (COZAAR) 100 MG tablet Take 100 mg by mouth daily.  . metoprolol succinate (TOPROL-XL) 100 MG 24 hr tablet Take 100 mg by mouth daily.  . metoprolol succinate (TOPROL-XL) 50 MG 24 hr tablet Take 50 mg by mouth daily.  . mirtazapine (REMERON SOL-TAB) 15 MG disintegrating tablet Take 15 mg by mouth at bedtime.  . potassium chloride (K-DUR) 10 MEQ tablet Take 1 tablet (10 mEq total) by mouth daily.  Marland Kitchen  QUEtiapine (SEROQUEL) 25 MG tablet Take 25 mg by mouth daily.  . Vitamin D, Ergocalciferol, 2000 units CAPS Take by mouth.     Allergies:   Oxycodone-acetaminophen; Ezetimibe; and Statins   Social History   Tobacco Use  . Smoking status: Never Smoker  . Smokeless tobacco: Never Used  Substance Use Topics  . Alcohol use: Not Currently  . Drug use: Never     Family Hx: The patient's family history includes Breast cancer in her mother; Cancer in her brother; Hypertension in her father; Kidney disease in her brother; Stroke in her father.  ROS:   Please see the history of present illness.     All other systems  reviewed and are negative.   Labs/Other Tests and Data Reviewed:    Recent Labs: 01/09/2019: ALT 23; B Natriuretic Peptide 1,390.0 01/10/2019: BUN 15; Creatinine, Ser 0.75; Hemoglobin 12.1; Magnesium 1.8; Platelets 210; Potassium 3.1; Sodium 140   Recent Lipid Panel No results found for: CHOL, TRIG, HDL, CHOLHDL, LDLCALC, LDLDIRECT  Wt Readings from Last 3 Encounters:  01/09/19 200 lb (90.7 kg)  09/05/18 221 lb (100.2 kg)  03/13/18 198 lb (89.8 kg)     Exam:    Vital Signs:  There were no vitals taken for this visit.   Well nourished, well developed female in no  acute distress.   ASSESSMENT & PLAN:    1. Chronic heart failure with reduced ejection fraction- - NYHA class II - euvolemic  today based on patient's description of symptoms - not weighing daily but does have scales. Instructed her and her grandson to weigh daily and to call for an overnight weight gain of >2 pounds or a weekly weight gain of >5 pounds - not adding salt and is using Mrs. Dash for seasoning - could consider changing her losartan to entresto in the future when we are able to monitor BP closely   - saw cardiology Gwen Pounds) 07/25/18 - BNP 01/09/2019 was 1390.0  2: HTN- - patient does not check her BP at home - says that she sees PCP at [facility] - BMP from 01/10/2019 reviewed and showed sodium 140, potassium 3.1, creatinine 0.75 and GFR >60  3: Cough- - initially patient denied a cough but then said she's been coughing for the last 3-4 years (patient does have dementia) - advised her and her grandson to call the PCP regarding her cough but she could take OTC mucinex to see if that would help  COVID-19 Education: The signs and symptoms of COVID-19 were discussed with the patient and how to seek care for testing (follow up with PCP or arrange E-visit).  The importance of social distancing was discussed today.  Patient Risk:   After full review of this patients clinical status, I feel that they  are at least moderate risk at this time.  Time:   Today, I have spent 18 minutes with the patient with telehealth technology discussing medications, daily weight and symptoms to call about.   Medication Adjustments/Labs and Tests Ordered: Current medicines are reviewed at length with the patient today.  Concerns regarding medicines are outlined above.   Tests Ordered: No orders of the defined types were placed in this encounter.  Medication Changes: No orders of the defined types were placed in this encounter.   Disposition: Follow-up in 6 weeks or sooner for any questions/problems before then.   Signed, Delma Freeze, FNP  01/17/2019 9:25 AM    [facility]

## 2019-01-17 NOTE — Telephone Encounter (Signed)
   TELEPHONE CALL NOTE  This patient has been deemed a candidate for follow-up tele-health visit to limit community exposure during the Covid-19 pandemic. I spoke with the patient via phone to discuss instructions. The patient was advised to review the section on consent for treatment as well. The patient will receive a phone call 2-3 days prior to their E-Visit at which time consent will be verbally confirmed. A Virtual Office Visit appointment type has been scheduled for 01/17/2019 with Clarisa Kindred FNP.  Nira Retort L, CMA 01/17/2019 9:04 AM

## 2019-01-18 ENCOUNTER — Telehealth: Payer: Medicare Other | Admitting: Family

## 2019-02-28 ENCOUNTER — Telehealth: Payer: Medicare Other | Admitting: Family

## 2019-06-17 ENCOUNTER — Encounter: Payer: Self-pay | Admitting: Emergency Medicine

## 2019-06-17 ENCOUNTER — Other Ambulatory Visit: Payer: Self-pay

## 2019-06-17 ENCOUNTER — Emergency Department: Payer: Medicare Other

## 2019-06-17 ENCOUNTER — Emergency Department
Admission: EM | Admit: 2019-06-17 | Discharge: 2019-06-17 | Disposition: A | Discharge status: Home / Self Care | Payer: Medicare Other | Attending: Emergency Medicine | Admitting: Emergency Medicine

## 2019-06-17 DIAGNOSIS — Z7901 Long term (current) use of anticoagulants: Secondary | ICD-10-CM | POA: Diagnosis not present

## 2019-06-17 DIAGNOSIS — Z23 Encounter for immunization: Secondary | ICD-10-CM | POA: Insufficient documentation

## 2019-06-17 DIAGNOSIS — Y92122 Bedroom in nursing home as the place of occurrence of the external cause: Secondary | ICD-10-CM | POA: Insufficient documentation

## 2019-06-17 DIAGNOSIS — W010XXA Fall on same level from slipping, tripping and stumbling without subsequent striking against object, initial encounter: Secondary | ICD-10-CM | POA: Diagnosis not present

## 2019-06-17 DIAGNOSIS — Y9301 Activity, walking, marching and hiking: Secondary | ICD-10-CM | POA: Diagnosis not present

## 2019-06-17 DIAGNOSIS — Z7982 Long term (current) use of aspirin: Secondary | ICD-10-CM | POA: Insufficient documentation

## 2019-06-17 DIAGNOSIS — S0990XA Unspecified injury of head, initial encounter: Secondary | ICD-10-CM | POA: Diagnosis present

## 2019-06-17 DIAGNOSIS — Z79899 Other long term (current) drug therapy: Secondary | ICD-10-CM | POA: Insufficient documentation

## 2019-06-17 DIAGNOSIS — W19XXXA Unspecified fall, initial encounter: Secondary | ICD-10-CM

## 2019-06-17 DIAGNOSIS — I5023 Acute on chronic systolic (congestive) heart failure: Secondary | ICD-10-CM | POA: Insufficient documentation

## 2019-06-17 DIAGNOSIS — I4891 Unspecified atrial fibrillation: Secondary | ICD-10-CM | POA: Insufficient documentation

## 2019-06-17 DIAGNOSIS — S098XXA Other specified injuries of head, initial encounter: Secondary | ICD-10-CM | POA: Insufficient documentation

## 2019-06-17 DIAGNOSIS — Z7984 Long term (current) use of oral hypoglycemic drugs: Secondary | ICD-10-CM | POA: Insufficient documentation

## 2019-06-17 DIAGNOSIS — Y999 Unspecified external cause status: Secondary | ICD-10-CM | POA: Diagnosis not present

## 2019-06-17 DIAGNOSIS — I110 Hypertensive heart disease with heart failure: Secondary | ICD-10-CM | POA: Insufficient documentation

## 2019-06-17 DIAGNOSIS — S0181XA Laceration without foreign body of other part of head, initial encounter: Secondary | ICD-10-CM | POA: Diagnosis not present

## 2019-06-17 DIAGNOSIS — E119 Type 2 diabetes mellitus without complications: Secondary | ICD-10-CM | POA: Insufficient documentation

## 2019-06-17 HISTORY — DX: Unspecified atrial fibrillation: I48.91

## 2019-06-17 LAB — CBC WITH DIFFERENTIAL/PLATELET
Abs Immature Granulocytes: 0.03 10*3/uL (ref 0.00–0.07)
Basophils Absolute: 0.1 10*3/uL (ref 0.0–0.1)
Basophils Relative: 1 %
Eosinophils Absolute: 0.2 10*3/uL (ref 0.0–0.5)
Eosinophils Relative: 2 %
HCT: 36.9 % (ref 36.0–46.0)
Hemoglobin: 11.9 g/dL — ABNORMAL LOW (ref 12.0–15.0)
Immature Granulocytes: 0 %
Lymphocytes Relative: 19 %
Lymphs Abs: 1.5 10*3/uL (ref 0.7–4.0)
MCH: 28.6 pg (ref 26.0–34.0)
MCHC: 32.2 g/dL (ref 30.0–36.0)
MCV: 88.7 fL (ref 80.0–100.0)
Monocytes Absolute: 0.6 10*3/uL (ref 0.1–1.0)
Monocytes Relative: 7 %
Neutro Abs: 5.3 10*3/uL (ref 1.7–7.7)
Neutrophils Relative %: 71 %
Platelets: 202 10*3/uL (ref 150–400)
RBC: 4.16 MIL/uL (ref 3.87–5.11)
RDW: 13.4 % (ref 11.5–15.5)
WBC: 7.5 10*3/uL (ref 4.0–10.5)
nRBC: 0 % (ref 0.0–0.2)

## 2019-06-17 MED ORDER — ACETAMINOPHEN 325 MG PO TABS
650.0000 mg | ORAL_TABLET | Freq: Once | ORAL | Status: AC
  Administered 2019-06-17: 650 mg via ORAL

## 2019-06-17 MED ORDER — TETANUS-DIPHTH-ACELL PERTUSSIS 5-2.5-18.5 LF-MCG/0.5 IM SUSP
0.5000 mL | Freq: Once | INTRAMUSCULAR | Status: AC
  Administered 2019-06-17: 0.5 mL via INTRAMUSCULAR
  Filled 2019-06-17: qty 0.5

## 2019-06-17 MED ORDER — TRANEXAMIC ACID 1000 MG/10ML IV SOLN
500.0000 mg | Freq: Once | INTRAVENOUS | Status: AC
  Administered 2019-06-17: 500 mg via TOPICAL
  Filled 2019-06-17: qty 10

## 2019-06-17 NOTE — ED Notes (Addendum)
Pt's daughter to bedside. Pt's face and neck cleansed of blood with NS and gauze.

## 2019-06-17 NOTE — ED Notes (Signed)
EKG completed

## 2019-06-17 NOTE — ED Notes (Signed)
Attempted for 22g IV at L fa. Pt kept clenching up arm even after education.

## 2019-06-17 NOTE — Discharge Instructions (Addendum)
1.  Suture removal in 5 days. 2.  Keep area clean and dry. 3.  Apply ice to affected area several times daily. 4.  Return to the ER for worsening symptoms, persistent vomiting, difficulty breathing, lethargy or other concerns.

## 2019-06-17 NOTE — ED Notes (Signed)
EDP Sung done with suturing/stitching. Gauze and ace wrap applied per verbal from [place].

## 2019-06-17 NOTE — ED Triage Notes (Signed)
Patient presents to Emergency Department via [place] from [facility] with complaints of fall.  Per EMS fall was witnessed, no LOC, pt reports falling on a rug and hitting her head on a wooden chair.  Pt takes xeralto and daily ASA      History of AFIB and DM

## 2019-06-17 NOTE — ED Provider Notes (Signed)
[facility] Emergency Department Provider Note   ____________________________________________   First MD Initiated Contact with Patient 06/17/19 [id number]     (approximate)  I have reviewed the triage vital signs and the nursing notes.   HISTORY  Chief Complaint Fall  Level V caveat: Limited by dementia  HPI Leah Ritter is a 68 y.o. female brought to the ED via EMS from [facility] status post mechanical fall.  Patient was on the way back from using the restroom when she tripped over a rug and fell, striking her forehead.  Fall was witnessed by nursing staff who state patient did not suffer LOC.  Patient takes aspirin as well as Xarelto for atrial fibrillation.  Voices no complaints.  Denies headache, vision changes, neck pain, chest pain, shortness of breath, abdominal pain, nausea, vomiting or dizziness.       Past Medical History:  Diagnosis Date   A-fib (HCC)    Anxiety    CHF (congestive heart failure) (HCC)    Diabetes (HCC)    GERD (gastroesophageal reflux disease)    Hypertension    Sleep apnea    Stroke [facility])     Patient Active Problem List   Diagnosis Date Noted   SIRS (systemic inflammatory response syndrome) (HCC) 01/09/2019   Acute on chronic systolic CHF (congestive heart failure) (HCC) 01/09/2019   Pain in joint involving ankle and foot 01/03/2018   Localized, primary osteoarthritis of shoulder region 01/03/2018   Rupture of tendon of biceps, long head 01/03/2018   Disorder of bursae of shoulder region 01/03/2018   Screening declined by patient 10/25/2017   Full thickness rotator cuff tear 04/21/2016   Chronic bilateral low back pain without sciatica 02/10/2016   H/O rotator cuff tear 02/10/2016   History of vitamin D deficiency 02/10/2016   Type 2 diabetes mellitus without complication, with long-term current use of insulin (HCC) 02/10/2016   Diabetes (HCC) 11/24/2006   HLD (hyperlipidemia) 11/24/2006    OBESITY, NOS 11/24/2006   POST TRAUMATIC STRESS DISORDER 11/24/2006   DEPRESSIVE DISORDER, NOS 11/24/2006   HYPERTENSION, BENIGN SYSTEMIC 11/24/2006   RHINITIS, ALLERGIC 11/24/2006   GERD (gastroesophageal reflux disease) 11/24/2006   OSTEOARTHRITIS, MULTI SITES 11/24/2006   APNEA, SLEEP 11/24/2006    Past Surgical History:  Procedure Laterality Date   PARTIAL HYSTERECTOMY     tubligation      Prior to Admission medications   Medication Sig Start Date End Date Taking? Authorizing Provider  amLODipine (NORVASC) 5 MG tablet amlodipine 5 mg tablet    [provider]  apixaban (ELIQUIS) 5 MG TABS tablet Take 5 mg by mouth daily.  08/21/18   [provider]  aspirin EC 81 MG tablet Take 81 mg by mouth daily.    [provider]  atorvastatin (LIPITOR) 80 MG tablet 80 mg daily at 6 PM.     [provider]  CONTOUR NEXT TEST test strip CHECK BLOOD SUGAR TID UTD 11/14/17   [provider]  donepezil (ARICEPT) 5 MG tablet Take 5 mg by mouth at bedtime.    [provider]  fluticasone Aleda Grana) 50 MCG/ACT nasal spray  08/29/18   [provider]  Fluticasone-Umeclidin-Vilant (TRELEGY ELLIPTA) 100-62.5-25 MCG/INH AEPB Inhale 1 puff into the lungs daily. Patient not taking: Reported on 01/10/2019 03/13/18   Yevonne Pax, MD  furosemide (LASIX) 20 MG tablet Take 40 mg by mouth daily.  03/17/18 03/17/19  [provider]  glipiZIDE (GLUCOTROL) 10 MG tablet Take 10  mg by mouth 2 (two) times daily before a meal.     [provider]  losartan (COZAAR) 100 MG tablet Take 100 mg by mouth daily.    [provider]  metoprolol succinate (TOPROL-XL) 100 MG 24 hr tablet Take 100 mg by mouth daily. 09/14/18   [provider]  metoprolol succinate (TOPROL-XL) 50 MG 24 hr tablet Take 50 mg by mouth daily. 11/24/18   [provider]  mirtazapine (REMERON SOL-TAB) 15 MG disintegrating tablet Take 15 mg by  mouth at bedtime. 11/24/18   [provider]  potassium chloride (K-DUR) 10 MEQ tablet Take 1 tablet (10 mEq total) by mouth daily. 01/10/19   Altamese Dilling, MD  QUEtiapine (SEROQUEL) 25 MG tablet Take 25 mg by mouth daily.    [provider]  Travoprost, BAK Free, (TRAVATAN Z) 0.004 % SOLN ophthalmic solution  05/10/17   [provider]  VENTOLIN HFA 108 (90 Base) MCG/ACT inhaler INL 2 INHALATIONS ITL Q 4 H PRF WHZ 11/27/17   [provider]  Vitamin D, Ergocalciferol, 2000 units CAPS Take by mouth.    [provider]    Allergies Oxycodone-acetaminophen, Ezetimibe, and Statins  Family History  Problem Relation Age of Onset   Breast cancer Mother    Stroke Father    Hypertension Father    Kidney disease Brother    Cancer Brother     Social History Social History   Tobacco Use   Smoking status: Never Smoker   Smokeless tobacco: Never Used  Substance Use Topics   Alcohol use: Not Currently   Drug use: Never    Review of Systems  Constitutional: Positive for mechanical fall.  No fever/chills Eyes: No visual changes. ENT: Positive for forehead laceration.  No sore throat. Cardiovascular: Denies chest pain. Respiratory: Denies shortness of breath. Gastrointestinal: No abdominal pain.  No nausea, no vomiting.  No diarrhea.  No constipation. Genitourinary: Negative for dysuria. Musculoskeletal: Negative for back pain. Skin: Negative for rash. Neurological: Negative for headaches, focal weakness or numbness.   ____________________________________________   PHYSICAL EXAM:  VITAL SIGNS: ED Triage Vitals  Enc Vitals Group     BP 06/17/19 0154 (!) 168/95     Pulse Rate 06/17/19 0154 (!) 102     Resp 06/17/19 0202 18     Temp 06/17/19 0202 97.8 F (36.6 C)     Temp Source 06/17/19 0202 Oral     SpO2 06/17/19 0154 100 %     Weight 06/17/19 0203 200 lb (90.7 kg)     Height 06/17/19 0203  (1.778 m)     Head  Circumference --      Peak Flow --      Pain Score 06/17/19 0203 0     Pain Loc --      Pain Edu? --      Excl. in [place]? --     Constitutional: Alert and oriented. Well appearing and in no acute distress.  Jovial. Eyes: Conjunctivae are normal. PERRL. EOMI. Head: Approximately 1.5 cm oblique and linear laceration over right forehead with surrounding hematoma.  Small arterial bleed noted. Nose: Atraumatic. Mouth/Throat: Mucous membranes are moist.  No dental malocclusion. Neck: No stridor.  No cervical spine tenderness to palpation. Cardiovascular: Normal rate, irregular rhythm. Grossly normal heart sounds.  Good peripheral circulation. Respiratory: Normal respiratory effort.  No retractions. Lungs CTAB. Gastrointestinal: Soft and nontender to light or deep palpation. No distention. No abdominal bruits. No CVA tenderness. Musculoskeletal: No  spinal tenderness to palpation.  Pelvis stable.  No lower extremity tenderness nor edema.  No joint effusions. Neurologic: Alert and oriented to person and place.  Normal speech and language. No gross focal neurologic deficits are appreciated.  Skin:  Skin is warm, dry and intact. No rash noted. Psychiatric: Mood and affect are normal. Speech and behavior are normal.  ____________________________________________   LABS (all labs ordered are listed, but only abnormal results are displayed)  Labs Reviewed  CBC WITH DIFFERENTIAL/PLATELET - Abnormal; Notable for the following components:      Result Value   Hemoglobin 11.9 (*)    All other components within normal limits  BASIC METABOLIC PANEL   ____________________________________________  EKG  ED ECG REPORT I, Zadia Uhde J, the attending physician, personally viewed and interpreted this ECG.   Date: 06/17/2019  EKG Time: 0311  Rate: 81  Rhythm: normal EKG, normal sinus rhythm  Axis: Normal  Intervals:none  ST&T Change:  Nonspecific  ____________________________________________  RADIOLOGY  ED MD interpretation: No ICH; no cervical spine injury  Official radiology report(s): Ct Head Wo Contrast  Result Date: 06/17/2019 CLINICAL DATA:  Fall EXAM: CT HEAD WITHOUT CONTRAST CT CERVICAL SPINE WITHOUT CONTRAST TECHNIQUE: Multidetector CT imaging of the head and cervical spine was performed following the standard protocol without intravenous contrast. Multiplanar CT image reconstructions of the cervical spine were also generated. COMPARISON:  None. FINDINGS: CT HEAD FINDINGS Brain: No acute territorial infarction, hemorrhage or intracranial mass. Encephalomalacia within the right frontal lobe. Mild atrophy. Moderate hypodensity in the white matter consistent with small vessel ischemic change. Nonenlarged ventricles Vascular: No hyperdense vessels.  Carotid vascular calcification. Skull: Normal. Negative for fracture or focal lesion. Sinuses/Orbits: Mild mucosal thickening in the sinuses. Other: None CT CERVICAL SPINE FINDINGS Alignment: Straightening of the cervical spine. No subluxation. Facet alignment within normal limits Skull base and vertebrae: No acute fracture. No primary bone lesion or focal pathologic process. Soft tissues and spinal canal: No prevertebral fluid or swelling. No visible canal hematoma. Disc levels: Moderate degenerative changes C4-C5 and C5-C6 with bulky anterior osteophytes. Upper chest: Negative. Other: None IMPRESSION: 1. No CT evidence for acute intracranial abnormality. Chronic right frontal lobe infarct. Atrophy and mild small vessel ischemic changes of the white matter. 2. Straightening of the cervical spine with degenerative change. No acute osseous abnormality. Electronically Signed   By: Jasmine PangKim  Fujinaga M.D.   On: 06/17/2019 03:04   Ct Cervical Spine Wo Contrast  Result Date: 06/17/2019 CLINICAL DATA:  Fall EXAM: CT HEAD WITHOUT CONTRAST CT CERVICAL SPINE WITHOUT CONTRAST TECHNIQUE:  Multidetector CT imaging of the head and cervical spine was performed following the standard protocol without intravenous contrast. Multiplanar CT image reconstructions of the cervical spine were also generated. COMPARISON:  None. FINDINGS: CT HEAD FINDINGS Brain: No acute territorial infarction, hemorrhage or intracranial mass. Encephalomalacia within the right frontal lobe. Mild atrophy. Moderate hypodensity in the white matter consistent with small vessel ischemic change. Nonenlarged ventricles Vascular: No hyperdense vessels.  Carotid vascular calcification. Skull: Normal. Negative for fracture or focal lesion. Sinuses/Orbits: Mild mucosal thickening in the sinuses. Other: None CT CERVICAL SPINE FINDINGS Alignment: Straightening of the cervical spine. No subluxation. Facet alignment within normal limits Skull base and vertebrae: No acute fracture. No primary bone lesion or focal pathologic process. Soft tissues and spinal canal: No prevertebral fluid or swelling. No visible canal hematoma. Disc levels: Moderate degenerative changes C4-C5 and C5-C6 with bulky anterior osteophytes. Upper chest: Negative. Other: None IMPRESSION: 1. No CT evidence  for acute intracranial abnormality. Chronic right frontal lobe infarct. Atrophy and mild small vessel ischemic changes of the white matter. 2. Straightening of the cervical spine with degenerative change. No acute osseous abnormality. Electronically Signed   By: Donavan Foil M.D.   On: 06/17/2019 03:04    ____________________________________________   PROCEDURES  Procedure(s) performed (including Critical Care):  Marland KitchenMarland KitchenLaceration Repair  Date/Time: 06/17/2019 2:46 AM Performed by: Paulette Blanch, MD Authorized by: Paulette Blanch, MD   Consent:    Consent obtained:  Verbal   Consent given by:  Patient   Risks discussed:  Infection, pain, poor cosmetic result and poor wound healing Anesthesia (see MAR for exact dosages):    Anesthesia method:  Local  infiltration   Local anesthetic:  Lidocaine 1% WITH epi Laceration details:    Location:  Face   Face location:  Forehead   Length (cm):  1.5   Depth (mm):  7 Repair type:    Repair type:  Complex Pre-procedure details:    Preparation:  Patient was prepped and draped in usual sterile fashion Exploration:    Hemostasis achieved with:  Direct pressure   Wound exploration: entire depth of wound probed and visualized     Contaminated: no   Treatment:    Area cleansed with:  Saline   Amount of cleaning:  Standard   Irrigation solution:  Sterile saline   Irrigation method:  Pressure wash   Visualized foreign bodies/material removed: no   Subcutaneous repair:    Suture size:  4-0   Suture material:  Vicryl   Number of sutures:  4 Skin repair:    Repair method:  Sutures   Suture size:  5-0   Suture material:  Nylon   Suture technique:  Running   Number of sutures: continuous. Approximation:    Approximation:  Close Post-procedure details:    Dressing:  Bulky dressing   Patient tolerance of procedure:  Tolerated well, no immediate complications Comments:     Gauze soaked with TXA and pressure dressing applied.     ____________________________________________   INITIAL IMPRESSION / ASSESSMENT AND PLAN / ED COURSE  As part of my medical decision making, I reviewed the following data within the [place] History obtained from family, Nursing notes reviewed and incorporated, Labs reviewed, EKG interpreted, Radiograph reviewed and Notes from prior ED visits     Leah Ritter was evaluated in Emergency Department on 06/17/2019 for the symptoms described in the history of present illness. She was evaluated in the context of the global COVID-19 pandemic, which necessitated consideration that the patient might be at risk for infection with the SARS-CoV-2 virus that causes COVID-19. Institutional protocols and algorithms that pertain to the evaluation of patients at  risk for COVID-19 are in a state of rapid change based on information released by regulatory bodies including the CDC and federal and state organizations. These policies and algorithms were followed during the patient's care in the ED.    68 year old female who presents with forehead laceration status post mechanical fall.  Differential diagnosis includes but is not limited to [place], SDH, cervical spine injury, arterial blood loss, etc.  Small arterial bleeder initially difficult to control.  Stabilized after Vicryl sutures.  Wound was oversewn with nylon sutures.  TXA soaked gauze applied for slight venous oozing.  Pressure dressing applied.  Will reexamine wound.  In the meantime, will obtain basic lab work, administer tetanus and patient will go to CT scan.   Clinical  Course as of Jun 17 447  Sun Jun 17, 2019  0319 Unwrap dressing to reexamine wound.  There is no active bleeding.  Hematoma has flattened.  Will redressed with Xeroform, Kerlix and Ace wrap.  Updated patient and her daughter on CT results.   [JS]  0446 H/H stable.  Updated patient and daughter. Strict return precautions given. Both verbalize understanding and agree with plan of care.   [JS]    Clinical Course User Index [JS] Irean HongSung, Kashtyn Jankowski J, MD     ____________________________________________   FINAL CLINICAL IMPRESSION(S) / ED DIAGNOSES  Final diagnoses:  Fall, initial encounter  Injury of head, initial encounter  Facial laceration, initial encounter     ED Discharge Orders    None       Note:  This document was prepared using Dragon voice recognition software and may include unintentional dictation errors.   Irean HongSung, Cortnie Ringel J, MD 06/17/19 [phone]

## 2019-06-17 NOTE — ED Notes (Signed)
Pt in for fall in which she hit her forehead. Has lac to R side of forehead. On blood thinners. Bleeding not well controlled. EDP Sung at bedside. Pt alert and in good spirits.

## 2019-06-17 NOTE — ED Notes (Signed)
Pt leaving for imaging.

## 2020-02-03 ENCOUNTER — Other Ambulatory Visit: Payer: Self-pay

## 2020-02-03 ENCOUNTER — Emergency Department
Admission: EM | Admit: 2020-02-03 | Discharge: 2020-02-04 | Disposition: A | Discharge status: Skilled Nursing Facility | Payer: Medicare Other | Attending: Emergency Medicine | Admitting: Emergency Medicine

## 2020-02-03 DIAGNOSIS — Z7982 Long term (current) use of aspirin: Secondary | ICD-10-CM | POA: Diagnosis not present

## 2020-02-03 DIAGNOSIS — E119 Type 2 diabetes mellitus without complications: Secondary | ICD-10-CM | POA: Insufficient documentation

## 2020-02-03 DIAGNOSIS — Z7984 Long term (current) use of oral hypoglycemic drugs: Secondary | ICD-10-CM | POA: Insufficient documentation

## 2020-02-03 DIAGNOSIS — Z79899 Other long term (current) drug therapy: Secondary | ICD-10-CM | POA: Diagnosis not present

## 2020-02-03 DIAGNOSIS — R410 Disorientation, unspecified: Secondary | ICD-10-CM | POA: Diagnosis not present

## 2020-02-03 DIAGNOSIS — I5022 Chronic systolic (congestive) heart failure: Secondary | ICD-10-CM | POA: Insufficient documentation

## 2020-02-03 DIAGNOSIS — I110 Hypertensive heart disease with heart failure: Secondary | ICD-10-CM | POA: Diagnosis not present

## 2020-02-03 DIAGNOSIS — R463 Overactivity: Secondary | ICD-10-CM | POA: Insufficient documentation

## 2020-02-03 DIAGNOSIS — Z7901 Long term (current) use of anticoagulants: Secondary | ICD-10-CM | POA: Insufficient documentation

## 2020-02-03 DIAGNOSIS — R4182 Altered mental status, unspecified: Secondary | ICD-10-CM | POA: Diagnosis present

## 2020-02-03 LAB — URINALYSIS, COMPLETE (UACMP) WITH MICROSCOPIC
Bacteria, UA: NONE SEEN
Bilirubin Urine: NEGATIVE
Glucose, UA: NEGATIVE mg/dL
Hgb urine dipstick: NEGATIVE
Ketones, ur: NEGATIVE mg/dL
Leukocytes,Ua: NEGATIVE
Nitrite: NEGATIVE
Protein, ur: NEGATIVE mg/dL
Specific Gravity, Urine: 1.018 (ref 1.005–1.030)
WBC, UA: NONE SEEN WBC/hpf (ref 0–5)
pH: 5 (ref 5.0–8.0)

## 2020-02-03 MED ORDER — QUETIAPINE FUMARATE 25 MG PO TABS
50.0000 mg | ORAL_TABLET | Freq: Every day | ORAL | Status: DC
  Administered 2020-02-03: 50 mg via ORAL
  Filled 2020-02-03: qty 2
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
## 2020-04-10 ENCOUNTER — Emergency Department
Admission: EM | Admit: 2020-04-10 | Discharge: 2020-04-10 | Disposition: A | Discharge status: Other Acute Inpatient Hospital | Payer: Medicare Other | Attending: Emergency Medicine | Admitting: Emergency Medicine

## 2020-04-10 ENCOUNTER — Other Ambulatory Visit: Payer: Self-pay

## 2020-04-10 DIAGNOSIS — Z139 Encounter for screening, unspecified: Secondary | ICD-10-CM

## 2020-04-10 DIAGNOSIS — E119 Type 2 diabetes mellitus without complications: Secondary | ICD-10-CM | POA: Insufficient documentation

## 2020-04-10 DIAGNOSIS — I110 Hypertensive heart disease with heart failure: Secondary | ICD-10-CM | POA: Diagnosis not present

## 2020-04-10 DIAGNOSIS — Z7982 Long term (current) use of aspirin: Secondary | ICD-10-CM | POA: Insufficient documentation

## 2020-04-10 DIAGNOSIS — I5043 Acute on chronic combined systolic (congestive) and diastolic (congestive) heart failure: Secondary | ICD-10-CM | POA: Diagnosis not present

## 2020-04-10 DIAGNOSIS — Z0000 Encounter for general adult medical examination without abnormal findings: Secondary | ICD-10-CM | POA: Diagnosis not present

## 2020-04-10 DIAGNOSIS — I25718 Atherosclerosis of autologous vein coronary artery bypass graft(s) with other forms of angina pectoris: Secondary | ICD-10-CM | POA: Insufficient documentation

## 2020-04-10 DIAGNOSIS — Z7901 Long term (current) use of anticoagulants: Secondary | ICD-10-CM | POA: Insufficient documentation

## 2020-04-10 DIAGNOSIS — Z79899 Other long term (current) drug therapy: Secondary | ICD-10-CM | POA: Insufficient documentation

## 2020-04-10 DIAGNOSIS — Z794 Long term (current) use of insulin: Secondary | ICD-10-CM | POA: Insufficient documentation

## 2020-04-10 DIAGNOSIS — I4891 Unspecified atrial fibrillation: Secondary | ICD-10-CM | POA: Diagnosis not present

## 2020-04-10 NOTE — ED Triage Notes (Signed)
Pt here via [facility] from [facility]. Per EMS pt has "new" left sided facial droop that staff at facility noted today. Per EMS, daughter was contacted and daughter reports this facial droop is old. Stroke screen negative with EMS. Pt ambulatory to stretcher.   Dementia at baseline.  EMS VSS- bp 112/56, HR 70, RA 100% O2, cbg 242.

## 2020-04-10 NOTE — ED Provider Notes (Signed)
[facility] Emergency Department Provider Note   ____________________________________________    I have reviewed the triage vital signs and the nursing notes.   HISTORY  Chief Complaint Facial droop  History limited by dementia  HPI Leah Ritter is a 69 y.o. female with a history of atrial fibrillation, CHF, diabetes, stroke, hypertension brought in by EMS for left-sided facial droop.  There is some question about whether this may be chronic as EMS spoke to daughter who reports that she always has a left-sided facial droop.  Patient is unable to provide any history given significant dementia  Past Medical History:  Diagnosis Date  . A-fib (HCC)   . Anxiety   . CHF (congestive heart failure) (HCC)   . Diabetes (HCC)   . GERD (gastroesophageal reflux disease)   . Hypertension   . Sleep apnea   . Stroke [facility])     Patient Active Problem List   Diagnosis Date Noted  . SIRS (systemic inflammatory response syndrome) (HCC) 01/09/2019  . Acute on chronic systolic CHF (congestive heart failure) (HCC) 01/09/2019  . Pain in joint involving ankle and foot 01/03/2018  . Localized, primary osteoarthritis of shoulder region 01/03/2018  . Rupture of tendon of biceps, long head 01/03/2018  . Disorder of bursae of shoulder region 01/03/2018  . Screening declined by patient 10/25/2017  . Full thickness rotator cuff tear 04/21/2016  . Chronic bilateral low back pain without sciatica 02/10/2016  . H/O rotator cuff tear 02/10/2016  . History of vitamin D deficiency 02/10/2016  . Type 2 diabetes mellitus without complication, with long-term current use of insulin (HCC) 02/10/2016  . Diabetes (HCC) 11/24/2006  . HLD (hyperlipidemia) 11/24/2006  . OBESITY, NOS 11/24/2006  . POST TRAUMATIC STRESS DISORDER 11/24/2006  . DEPRESSIVE DISORDER, NOS 11/24/2006  . HYPERTENSION, BENIGN SYSTEMIC 11/24/2006  . RHINITIS, ALLERGIC 11/24/2006  . GERD (gastroesophageal reflux  disease) 11/24/2006  . OSTEOARTHRITIS, MULTI SITES 11/24/2006  . APNEA, SLEEP 11/24/2006    Past Surgical History:  Procedure Laterality Date  . PARTIAL HYSTERECTOMY    . tubligation      Prior to Admission medications   Medication Sig Start Date End Date Taking? Authorizing Provider  amLODipine (NORVASC) 5 MG tablet amlodipine 5 mg tablet    [provider]  apixaban (ELIQUIS) 5 MG TABS tablet Take 5 mg by mouth daily.  08/21/18   [provider]  aspirin EC 81 MG tablet Take 81 mg by mouth daily.    [provider]  atorvastatin (LIPITOR) 80 MG tablet 80 mg daily at 6 PM.     [provider]  CONTOUR NEXT TEST test strip CHECK BLOOD SUGAR TID UTD 11/14/17   [provider]  donepezil (ARICEPT) 5 MG tablet Take 5 mg by mouth at bedtime.    [provider]  fluticasone Aleda Grana) 50 MCG/ACT nasal spray  08/29/18   [provider]  Fluticasone-Umeclidin-Vilant (TRELEGY ELLIPTA) 100-62.5-25 MCG/INH AEPB Inhale 1 puff into the lungs daily. Patient not taking: Reported on 01/10/2019 03/13/18   Yevonne Pax, MD  furosemide (LASIX) 20 MG tablet Take 40 mg by mouth daily.  03/17/18 03/17/19  [provider]  glipiZIDE (GLUCOTROL) 10 MG tablet Take 10 mg by mouth 2 (two) times daily before a meal.     [provider]  losartan (COZAAR) 100 MG tablet Take 100 mg by mouth daily.    [provider]  metoprolol succinate (TOPROL-XL) 100 MG 24 hr tablet Take  100 mg by mouth daily. 09/14/18   [provider]  metoprolol succinate (TOPROL-XL) 50 MG 24 hr tablet Take 50 mg by mouth daily. 11/24/18   [provider]  mirtazapine (REMERON SOL-TAB) 15 MG disintegrating tablet Take 15 mg by mouth at bedtime. 11/24/18   [provider]  potassium chloride (K-DUR) 10 MEQ tablet Take 1 tablet (10 mEq total) by mouth daily. 01/10/19   Altamese Dilling, MD  QUEtiapine (SEROQUEL) 25 MG tablet Take 25  mg by mouth daily.    [provider]  Travoprost, BAK Free, (TRAVATAN Z) 0.004 % SOLN ophthalmic solution  05/10/17   [provider]  VENTOLIN HFA 108 (90 Base) MCG/ACT inhaler INL 2 INHALATIONS ITL Q 4 H PRF WHZ 11/27/17   [provider]  Vitamin D, Ergocalciferol, 2000 units CAPS Take by mouth.    [provider]     Allergies Oxycodone-acetaminophen, Ezetimibe, Lisinopril, and Statins  Family History  Problem Relation Age of Onset  . Breast cancer Mother   . Stroke Father   . Hypertension Father   . Kidney disease Brother   . Cancer Brother     Social History Social History   Tobacco Use  . Smoking status: Never Smoker  . Smokeless tobacco: Never Used  Vaping Use  . Vaping Use: Never used  Substance Use Topics  . Alcohol use: Not Currently  . Drug use: Never    Review of Systems limited by dementia     ____________________________________________   PHYSICAL EXAM:  VITAL SIGNS: ED Triage Vitals  Enc Vitals Group     BP      Pulse      Resp      Temp      Temp src      SpO2      Weight      Height      Head Circumference      Peak Flow      Pain Score      Pain Loc      Pain Edu?      Excl. in GC?     Constitutional: Alert, no acute distress, talkative Eyes: Conjunctivae are normal.  Head: Atraumatic. Nose: No congestion/rhinnorhea. Mouth/Throat: Mucous membranes are moist.    Cardiovascular: Normal rate, regular rhythm.  Good peripheral circulation. Respiratory: Normal respiratory effort.  No retractions.  Gastrointestinal: Soft and nontender. No distention.  . Musculoskeletal:   Warm and well perfused Neurologic:  Normal speech and language.  Left lower facial droop, mild Skin:  Skin is warm, dry and intact. No rash noted. Psychiatric: Mood and affect are normal. Speech and behavior are normal.  ____________________________________________   LABS (all labs ordered are listed, but only abnormal results  are displayed)  Labs Reviewed - No data to display ____________________________________________  EKG  None ____________________________________________  RADIOLOGY   ____________________________________________   PROCEDURES  Procedure(s) performed: No  Procedures   Critical Care performed: No ____________________________________________   INITIAL IMPRESSION / ASSESSMENT AND PLAN / ED COURSE  Pertinent labs & imaging results that were available during my care of the patient were reviewed by me and considered in my medical decision making (see chart for details).  Patient with mild left lower facial droop.  Daughter is here who notes that this is unchanged from prior and that no further work-up is necessary or needed at this time.  She reports that sometimes when she wakes up it is worse and then improves as the day  goes on    ____________________________________________   FINAL CLINICAL IMPRESSION(S) / ED DIAGNOSES  Final diagnoses:  Encounter for medical screening examination        Note:  This document was prepared using Dragon voice recognition software and may include unintentional dictation errors.   Jene Every, MD 04/10/20 1343

## 2020-04-10 NOTE — ED Notes (Addendum)
Pt unable to sign for discharge due to condition. Daughter verbalizes d/c instructions with no further questions at this time. Pt discharged with daughter.   Report given to brittany at [facility].

## 2020-11-07 ENCOUNTER — Other Ambulatory Visit: Payer: Self-pay

## 2020-11-07 ENCOUNTER — Emergency Department
Admission: EM | Admit: 2020-11-07 | Discharge: 2020-11-07 | Disposition: A | Discharge status: Home / Self Care | Payer: Medicare Other | Attending: Emergency Medicine | Admitting: Emergency Medicine

## 2020-11-07 DIAGNOSIS — Z7982 Long term (current) use of aspirin: Secondary | ICD-10-CM | POA: Insufficient documentation

## 2020-11-07 DIAGNOSIS — K623 Rectal prolapse: Secondary | ICD-10-CM | POA: Insufficient documentation

## 2020-11-07 DIAGNOSIS — Z7901 Long term (current) use of anticoagulants: Secondary | ICD-10-CM | POA: Insufficient documentation

## 2020-11-07 DIAGNOSIS — Z79899 Other long term (current) drug therapy: Secondary | ICD-10-CM | POA: Diagnosis not present

## 2020-11-07 DIAGNOSIS — I4891 Unspecified atrial fibrillation: Secondary | ICD-10-CM | POA: Diagnosis not present

## 2020-11-07 DIAGNOSIS — K6289 Other specified diseases of anus and rectum: Secondary | ICD-10-CM | POA: Diagnosis present

## 2020-11-07 DIAGNOSIS — Z7984 Long term (current) use of oral hypoglycemic drugs: Secondary | ICD-10-CM | POA: Diagnosis not present

## 2020-11-07 DIAGNOSIS — X58XXXA Exposure to other specified factors, initial encounter: Secondary | ICD-10-CM | POA: Insufficient documentation

## 2020-11-07 DIAGNOSIS — E119 Type 2 diabetes mellitus without complications: Secondary | ICD-10-CM | POA: Insufficient documentation

## 2020-11-07 DIAGNOSIS — F0390 Unspecified dementia without behavioral disturbance: Secondary | ICD-10-CM | POA: Insufficient documentation

## 2020-11-07 DIAGNOSIS — I5023 Acute on chronic systolic (congestive) heart failure: Secondary | ICD-10-CM | POA: Diagnosis not present

## 2020-11-07 DIAGNOSIS — T192XXA Foreign body in vulva and vagina, initial encounter: Secondary | ICD-10-CM | POA: Diagnosis not present

## 2020-11-07 DIAGNOSIS — I110 Hypertensive heart disease with heart failure: Secondary | ICD-10-CM | POA: Insufficient documentation

## 2020-11-07 MED ORDER — DOCUSATE SODIUM 100 MG PO CAPS: ORAL_CAPSULE | ORAL | 0 refills | Status: AC

## 2020-11-07 NOTE — ED Notes (Signed)
Report called to [facility] given to Adorable, the night caregiver. Follow-up discussed, medications and precautions discussed with caregiver. Caregiver informed that EDP has spoken with daughter about follow up and care.  Patient in NAD in stretcher. Patient has sitter and "baby doll" bedside.

## 2020-11-07 NOTE — Discharge Instructions (Addendum)
For prolapse:  Take the stool softener as prescribed  Try to minimize straining during bowel movements  Try to avoid sitting for prolonged periods of time - take time lying back  If you notice pink, bulging tissue, you can apply firm, gentle pressure towards the rectum to reduce this  As long as you have no pain or bleeding, this is okay  Follow-up w/ a general surgeon to discuss options. Alternately, sometimes an OBGYN can help with a pessary.

## 2020-11-07 NOTE — ED Triage Notes (Signed)
Patient from [facility] via [facility]. EMS reports called out for "a ball between her legs for months" per her daughter. Patient is alert and oriented at baseline. Patient has a history of dementia. Patient is currently refusing care at this time.

## 2020-11-07 NOTE — ED Provider Notes (Signed)
[facility] Emergency Department Provider Note  ____________________________________________   Event Date/Time   First MD Initiated Contact with Patient 11/07/20 2113     (approximate)  I have reviewed the triage vital signs and the nursing notes.   HISTORY  Chief Complaint Foreign Body in Vagina    HPI Leah Ritter is a 70 y.o. female  Here with reported  "ball between her legs." Pt has been c/o rectal pain/pressure between her legs for months according to EMS and family. Daughter states that today was the first day pt would let her look at it when she was having some discomfort. She noticed a large, pink mass so sent pt for evaluation. Pt has severe dementia, limiting history. No known h/o prolapse. She is s/p hysterectomy.  Level 5 caveat invoked as remainder of history, ROS, and physical exam limited due to patient's dementia.        Past Medical History:  Diagnosis Date  . A-fib (HCC)   . Anxiety   . CHF (congestive heart failure) (HCC)   . Diabetes (HCC)   . GERD (gastroesophageal reflux disease)   . Hypertension   . Sleep apnea   . Stroke [facility])     Patient Active Problem List   Diagnosis Date Noted  . SIRS (systemic inflammatory response syndrome) (HCC) 01/09/2019  . Acute on chronic systolic CHF (congestive heart failure) (HCC) 01/09/2019  . Pain in joint involving ankle and foot 01/03/2018  . Localized, primary osteoarthritis of shoulder region 01/03/2018  . Rupture of tendon of biceps, long head 01/03/2018  . Disorder of bursae of shoulder region 01/03/2018  . Screening declined by patient 10/25/2017  . Full thickness rotator cuff tear 04/21/2016  . Chronic bilateral low back pain without sciatica 02/10/2016  . H/O rotator cuff tear 02/10/2016  . History of vitamin D deficiency 02/10/2016  . Type 2 diabetes mellitus without complication, with long-term current use of insulin (HCC) 02/10/2016  . Diabetes (HCC) 11/24/2006  . HLD  (hyperlipidemia) 11/24/2006  . OBESITY, NOS 11/24/2006  . POST TRAUMATIC STRESS DISORDER 11/24/2006  . DEPRESSIVE DISORDER, NOS 11/24/2006  . HYPERTENSION, BENIGN SYSTEMIC 11/24/2006  . RHINITIS, ALLERGIC 11/24/2006  . GERD (gastroesophageal reflux disease) 11/24/2006  . OSTEOARTHRITIS, MULTI SITES 11/24/2006  . APNEA, SLEEP 11/24/2006    Past Surgical History:  Procedure Laterality Date  . PARTIAL HYSTERECTOMY    . tubligation      Prior to Admission medications   Medication Sig Start Date End Date Taking? Authorizing Provider  docusate sodium (COLACE) 100 MG capsule Take 1 capsule (100 mg total) by mouth 2 (two) times daily for 14 days, THEN 1 capsule (100 mg total) daily for 16 days. 11/07/20 12/07/20 Yes Shaune Pollack, MD  amLODipine (NORVASC) 5 MG tablet amlodipine 5 mg tablet    [provider]  apixaban (ELIQUIS) 5 MG TABS tablet Take 5 mg by mouth daily.  08/21/18   [provider]  aspirin EC 81 MG tablet Take 81 mg by mouth daily.    [provider]  atorvastatin (LIPITOR) 80 MG tablet 80 mg daily at 6 PM.     [provider]  CONTOUR NEXT TEST test strip CHECK BLOOD SUGAR TID UTD 11/14/17   [provider]  donepezil (ARICEPT) 5 MG tablet Take 5 mg by mouth at bedtime.    [provider]  fluticasone Aleda Grana) 50 MCG/ACT nasal spray  08/29/18   [provider]  Fluticasone-Umeclidin-Vilant (TRELEGY ELLIPTA) 100-62.5-25 MCG/INH AEPB  Inhale 1 puff into the lungs daily. Patient not taking: Reported on 01/10/2019 03/13/18   Yevonne Pax, MD  furosemide (LASIX) 20 MG tablet Take 40 mg by mouth daily.  03/17/18 03/17/19  [provider]  glipiZIDE (GLUCOTROL) 10 MG tablet Take 10 mg by mouth 2 (two) times daily before a meal.     [provider]  losartan (COZAAR) 100 MG tablet Take 100 mg by mouth daily.    [provider]  metoprolol succinate (TOPROL-XL) 100 MG 24 hr tablet Take 100 mg by  mouth daily. 09/14/18   [provider]  metoprolol succinate (TOPROL-XL) 50 MG 24 hr tablet Take 50 mg by mouth daily. 11/24/18   [provider]  mirtazapine (REMERON SOL-TAB) 15 MG disintegrating tablet Take 15 mg by mouth at bedtime. 11/24/18   [provider]  potassium chloride (K-DUR) 10 MEQ tablet Take 1 tablet (10 mEq total) by mouth daily. 01/10/19   Altamese Dilling, MD  QUEtiapine (SEROQUEL) 25 MG tablet Take 25 mg by mouth daily.    [provider]  Travoprost, BAK Free, (TRAVATAN Z) 0.004 % SOLN ophthalmic solution  05/10/17   [provider]  VENTOLIN HFA 108 (90 Base) MCG/ACT inhaler INL 2 INHALATIONS ITL Q 4 H PRF WHZ 11/27/17   [provider]  Vitamin D, Ergocalciferol, 2000 units CAPS Take by mouth.    [provider]    Allergies Oxycodone-acetaminophen, Ezetimibe, Lisinopril, and Statins  Family History  Problem Relation Age of Onset  . Breast cancer Mother   . Stroke Father   . Hypertension Father   . Kidney disease Brother   . Cancer Brother     Social History Social History   Tobacco Use  . Smoking status: Never Smoker  . Smokeless tobacco: Never Used  Vaping Use  . Vaping Use: Never used  Substance Use Topics  . Alcohol use: Not Currently  . Drug use: Never    Review of Systems  Review of Systems  Unable to perform ROS: Dementia     ____________________________________________  PHYSICAL EXAM:      VITAL SIGNS: ED Triage Vitals  Enc Vitals Group     BP 11/07/20 2112 (!) 153/74     Pulse Rate 11/07/20 2112 90     Resp 11/07/20 2112 (!) 22     Temp 11/07/20 2112 98.8 F (37.1 C)     Temp Source 11/07/20 2112 Oral     SpO2 11/07/20 2112 99 %     Weight 11/07/20 2113 202 lb 13.2 oz (92 kg)     Height 11/07/20 2113 5\' 9"  (1.753 m)     Head Circumference --      Peak Flow --      Pain Score --      Pain Loc --      Pain Edu? --      Excl. in GC? --      Physical  Exam Vitals and nursing note reviewed.  Constitutional:      General: She is not in acute distress.    Appearance: She is well-developed and well-nourished.  HENT:     Head: Normocephalic and atraumatic.  Eyes:     Conjunctiva/sclera: Conjunctivae normal.  Cardiovascular:     Rate and Rhythm: Normal rate and regular rhythm.     Heart sounds: Normal heart sounds.  Pulmonary:     Effort: Pulmonary effort is normal. No respiratory distress.     Breath sounds: No  wheezing.  Abdominal:     General: There is no distension.  Genitourinary:    Comments: Soft, reducible, perfused and pink rectal prolapse noted. No duskiness or signs of ischemia. No bleeding. Musculoskeletal:        General: No edema.     Cervical back: Neck supple.  Skin:    General: Skin is warm.     Capillary Refill: Capillary refill takes less than 2 seconds.     Findings: No rash.  Neurological:     Mental Status: She is alert. She is disoriented.     Motor: No abnormal muscle tone.     Comments: Disoriented, combative but no focal deficits. MAE with at least antigravity strength. Face is symmetric.        ____________________________________________   LABS (all labs ordered are listed, but only abnormal results are displayed)  Labs Reviewed - No data to display  ____________________________________________  EKG:  ________________________________________  RADIOLOGY All imaging, including plain films, CT scans, and ultrasounds, independently reviewed by me, and interpretations confirmed via formal radiology reads.  ED MD interpretation:     Official radiology report(s): No results found.  ____________________________________________  PROCEDURES   Procedure(s) performed (including Critical Care):  Procedures  ____________________________________________  INITIAL IMPRESSION / MDM / ASSESSMENT AND PLAN / ED COURSE  As part of my medical decision making, I reviewed the following data within  the electronic MEDICAL RECORD NUMBER Nursing notes reviewed and incorporated, Old chart reviewed, Notes from prior ED visits, and [place] Controlled Substance Database       *Bralee Feldt was evaluated in Emergency Department on 11/08/2020 for the symptoms described in the history of present illness. She was evaluated in the context of the global COVID-19 pandemic, which necessitated consideration that the patient might be at risk for infection with the SARS-CoV-2 virus that causes COVID-19. Institutional protocols and algorithms that pertain to the evaluation of patients at risk for COVID-19 are in a state of rapid change based on information released by regulatory bodies including the CDC and federal and state organizations. These policies and algorithms were followed during the patient's care in the ED.  Some ED evaluations and interventions may be delayed as a result of limited staffing during the pandemic.*     Medical Decision Making:  70 yo F here with "ball between her legs." On exam, pt does have what appears to be a rectal prolapse. The visualized rectal mucosa is pink, well perfused, and this was easily reduced at bedside completely. No bleeding. No abd pain or signs of ischemia. This is a recurrent issue according to family and facility.  I discussed with daughter that management would likely be supportive for now with stool softeners, gentle reduction, but will refer for gen surg f/u. Pt does have h/o hysterectomy as well with some apparent mild vaginal prolapse on exam, so could benefit from pessary.   No signs of ischemia or other complications.   ____________________________________________  FINAL CLINICAL IMPRESSION(S) / ED DIAGNOSES  Final diagnoses:  Rectal prolapse     MEDICATIONS GIVEN DURING THIS VISIT:  Medications - No data to display   ED Discharge Orders         Ordered    docusate sodium (COLACE) 100 MG capsule        11/07/20 2215           Note:  This  document was prepared using Dragon voice recognition software and may include unintentional dictation errors.   Shaune Pollack, MD  11/08/20 0155  

## 2020-11-07 NOTE — ED Notes (Addendum)
EDP bedside. EDP examining patient's behind and able to push prolapse back into patient. Patient states pain with palpation.

## 2020-12-08 ENCOUNTER — Ambulatory Visit: Payer: Medicare Other | Admitting: Surgery

## 2021-05-05 ENCOUNTER — Other Ambulatory Visit: Payer: Self-pay

## 2021-05-05 ENCOUNTER — Emergency Department: Payer: Medicare Other

## 2021-05-05 ENCOUNTER — Inpatient Hospital Stay
Admission: EM | Admit: 2021-05-05 | Discharge: 2021-05-08 | DRG: 177 | Disposition: A | Discharge status: Home / Self Care | Payer: Medicare Other | Source: Skilled Nursing Facility | Attending: Internal Medicine | Admitting: Internal Medicine

## 2021-05-05 ENCOUNTER — Encounter: Payer: Self-pay | Admitting: Emergency Medicine

## 2021-05-05 DIAGNOSIS — D649 Anemia, unspecified: Secondary | ICD-10-CM | POA: Diagnosis present

## 2021-05-05 DIAGNOSIS — E860 Dehydration: Secondary | ICD-10-CM | POA: Diagnosis present

## 2021-05-05 DIAGNOSIS — Z20822 Contact with and (suspected) exposure to covid-19: Secondary | ICD-10-CM | POA: Diagnosis present

## 2021-05-05 DIAGNOSIS — R4182 Altered mental status, unspecified: Secondary | ICD-10-CM | POA: Diagnosis not present

## 2021-05-05 DIAGNOSIS — I5022 Chronic systolic (congestive) heart failure: Secondary | ICD-10-CM | POA: Diagnosis present

## 2021-05-05 DIAGNOSIS — J690 Pneumonitis due to inhalation of food and vomit: Secondary | ICD-10-CM | POA: Diagnosis present

## 2021-05-05 DIAGNOSIS — G4730 Sleep apnea, unspecified: Secondary | ICD-10-CM | POA: Diagnosis present

## 2021-05-05 DIAGNOSIS — Z8249 Family history of ischemic heart disease and other diseases of the circulatory system: Secondary | ICD-10-CM

## 2021-05-05 DIAGNOSIS — R0689 Other abnormalities of breathing: Secondary | ICD-10-CM | POA: Diagnosis present

## 2021-05-05 DIAGNOSIS — Z79899 Other long term (current) drug therapy: Secondary | ICD-10-CM | POA: Diagnosis not present

## 2021-05-05 DIAGNOSIS — Z7951 Long term (current) use of inhaled steroids: Secondary | ICD-10-CM | POA: Diagnosis not present

## 2021-05-05 DIAGNOSIS — N179 Acute kidney failure, unspecified: Secondary | ICD-10-CM | POA: Diagnosis present

## 2021-05-05 DIAGNOSIS — I248 Other forms of acute ischemic heart disease: Secondary | ICD-10-CM | POA: Diagnosis present

## 2021-05-05 DIAGNOSIS — I482 Chronic atrial fibrillation, unspecified: Secondary | ICD-10-CM | POA: Diagnosis present

## 2021-05-05 DIAGNOSIS — Z841 Family history of disorders of kidney and ureter: Secondary | ICD-10-CM

## 2021-05-05 DIAGNOSIS — Z7901 Long term (current) use of anticoagulants: Secondary | ICD-10-CM

## 2021-05-05 DIAGNOSIS — Z7984 Long term (current) use of oral hypoglycemic drugs: Secondary | ICD-10-CM

## 2021-05-05 DIAGNOSIS — E1165 Type 2 diabetes mellitus with hyperglycemia: Secondary | ICD-10-CM | POA: Diagnosis present

## 2021-05-05 DIAGNOSIS — K219 Gastro-esophageal reflux disease without esophagitis: Secondary | ICD-10-CM | POA: Diagnosis present

## 2021-05-05 DIAGNOSIS — Z803 Family history of malignant neoplasm of breast: Secondary | ICD-10-CM

## 2021-05-05 DIAGNOSIS — E875 Hyperkalemia: Secondary | ICD-10-CM | POA: Diagnosis present

## 2021-05-05 DIAGNOSIS — M159 Polyosteoarthritis, unspecified: Secondary | ICD-10-CM | POA: Diagnosis present

## 2021-05-05 DIAGNOSIS — Z885 Allergy status to narcotic agent status: Secondary | ICD-10-CM

## 2021-05-05 DIAGNOSIS — Z7982 Long term (current) use of aspirin: Secondary | ICD-10-CM | POA: Diagnosis not present

## 2021-05-05 DIAGNOSIS — G9341 Metabolic encephalopathy: Secondary | ICD-10-CM | POA: Diagnosis present

## 2021-05-05 DIAGNOSIS — I110 Hypertensive heart disease with heart failure: Secondary | ICD-10-CM | POA: Diagnosis present

## 2021-05-05 DIAGNOSIS — I959 Hypotension, unspecified: Secondary | ICD-10-CM | POA: Diagnosis present

## 2021-05-05 DIAGNOSIS — R1311 Dysphagia, oral phase: Secondary | ICD-10-CM | POA: Diagnosis present

## 2021-05-05 DIAGNOSIS — N8110 Cystocele, unspecified: Secondary | ICD-10-CM

## 2021-05-05 DIAGNOSIS — Z8673 Personal history of transient ischemic attack (TIA), and cerebral infarction without residual deficits: Secondary | ICD-10-CM

## 2021-05-05 DIAGNOSIS — Z823 Family history of stroke: Secondary | ICD-10-CM

## 2021-05-05 DIAGNOSIS — R739 Hyperglycemia, unspecified: Secondary | ICD-10-CM

## 2021-05-05 DIAGNOSIS — Z888 Allergy status to other drugs, medicaments and biological substances status: Secondary | ICD-10-CM

## 2021-05-05 DIAGNOSIS — F0150 Vascular dementia without behavioral disturbance: Secondary | ICD-10-CM | POA: Diagnosis present

## 2021-05-05 LAB — CBC WITH DIFFERENTIAL/PLATELET
Abs Immature Granulocytes: 0.02 10*3/uL (ref 0.00–0.07)
Basophils Absolute: 0.1 10*3/uL (ref 0.0–0.1)
Basophils Relative: 1 %
Eosinophils Absolute: 0 10*3/uL (ref 0.0–0.5)
Eosinophils Relative: 0 %
HCT: 29.2 % — ABNORMAL LOW (ref 36.0–46.0)
Hemoglobin: 9.5 g/dL — ABNORMAL LOW (ref 12.0–15.0)
Immature Granulocytes: 0 %
Lymphocytes Relative: 9 %
Lymphs Abs: 1 10*3/uL (ref 0.7–4.0)
MCH: 30 pg (ref 26.0–34.0)
MCHC: 32.5 g/dL (ref 30.0–36.0)
MCV: 92.1 fL (ref 80.0–100.0)
Monocytes Absolute: 1 10*3/uL (ref 0.1–1.0)
Monocytes Relative: 9 %
Neutro Abs: 8.4 10*3/uL — ABNORMAL HIGH (ref 1.7–7.7)
Neutrophils Relative %: 81 %
Platelets: 244 10*3/uL (ref 150–400)
RBC: 3.17 MIL/uL — ABNORMAL LOW (ref 3.87–5.11)
RDW: 13.7 % (ref 11.5–15.5)
WBC: 10.5 10*3/uL (ref 4.0–10.5)
nRBC: 0 % (ref 0.0–0.2)

## 2021-05-05 LAB — COMPREHENSIVE METABOLIC PANEL
ALT: 18 U/L (ref 0–44)
AST: 33 U/L (ref 15–41)
Albumin: 3.5 g/dL (ref 3.5–5.0)
Alkaline Phosphatase: 85 U/L (ref 38–126)
Anion gap: 11 (ref 5–15)
BUN: 58 mg/dL — ABNORMAL HIGH (ref 8–23)
CO2: 23 mmol/L (ref 22–32)
Calcium: 8.8 mg/dL — ABNORMAL LOW (ref 8.9–10.3)
Chloride: 98 mmol/L (ref 98–111)
Creatinine, Ser: 2.62 mg/dL — ABNORMAL HIGH (ref 0.44–1.00)
GFR, Estimated: 19 mL/min — ABNORMAL LOW (ref >60)
Glucose, Bld: 458 mg/dL — ABNORMAL HIGH (ref 70–99)
Potassium: 6.1 mmol/L — ABNORMAL HIGH (ref 3.5–5.1)
Sodium: 132 mmol/L — ABNORMAL LOW (ref 135–145)
Total Bilirubin: 1 mg/dL (ref 0.3–1.2)
Total Protein: 7.5 g/dL (ref 6.5–8.1)

## 2021-05-05 LAB — BLOOD GAS, VENOUS
Acid-base deficit: 1.4 mmol/L (ref 0.0–2.0)
Bicarbonate: 24.8 mmol/L (ref 20.0–28.0)
O2 Saturation: 77 %
Patient temperature: 37
pCO2, Ven: 47 mmHg (ref 44.0–60.0)
pH, Ven: 7.33 (ref 7.250–7.430)
pO2, Ven: 45 mmHg (ref 32.0–45.0)

## 2021-05-05 LAB — URINALYSIS, COMPLETE (UACMP) WITH MICROSCOPIC
Bilirubin Urine: NEGATIVE
Glucose, UA: >=500 mg/dL — AB
Ketones, ur: NEGATIVE mg/dL
Leukocytes,Ua: NEGATIVE
Nitrite: NEGATIVE
Protein, ur: NEGATIVE mg/dL
Specific Gravity, Urine: 1.014 (ref 1.005–1.030)
pH: 5 (ref 5.0–8.0)

## 2021-05-05 LAB — RESP PANEL BY RT-PCR (FLU A&B, COVID) ARPGX2
Influenza A by PCR: NEGATIVE
Influenza B by PCR: NEGATIVE
SARS Coronavirus 2 by RT PCR: NEGATIVE

## 2021-05-05 LAB — GLUCOSE, CAPILLARY: Glucose-Capillary: 334 mg/dL — ABNORMAL HIGH (ref 70–99)

## 2021-05-05 MED ORDER — AMPICILLIN-SULBACTAM SODIUM 1.5 (1-0.5) G IJ SOLR
1.5000 g | Freq: Two times a day (BID) | INTRAMUSCULAR | Status: DC
  Administered 2021-05-06: 1.5 g via INTRAVENOUS
  Filled 2021-05-05 (×4): qty 4

## 2021-05-05 MED ORDER — LACTATED RINGERS IV BOLUS
1000.0000 mL | Freq: Once | INTRAVENOUS | Status: AC
  Administered 2021-05-05: 1000 mL via INTRAVENOUS

## 2021-05-05 MED ORDER — SODIUM CHLORIDE 0.9 % IV BOLUS
500.0000 mL | Freq: Once | INTRAVENOUS | Status: AC
  Administered 2021-05-05: 500 mL via INTRAVENOUS

## 2021-05-05 MED ORDER — INSULIN ASPART 100 UNIT/ML IJ SOLN
0.0000 [IU] | INTRAMUSCULAR | Status: DC
  Administered 2021-05-05: 11 [IU] via SUBCUTANEOUS
  Administered 2021-05-06 (×2): 3 [IU] via SUBCUTANEOUS
  Filled 2021-05-05 (×3): qty 1

## 2021-05-05 MED ORDER — DEXTROSE 50 % IV SOLN
1.0000 | Freq: Once | INTRAVENOUS | Status: AC
  Administered 2021-05-05: 23:00:00 50 mL via INTRAVENOUS
  Filled 2021-05-05: qty 50

## 2021-05-05 MED ORDER — APIXABAN 5 MG PO TABS: 5.0000 mg | ORAL_TABLET | Freq: Every day | ORAL | Status: DC

## 2021-05-05 MED ORDER — RIVAROXABAN 20 MG PO TABS
20.0000 mg | ORAL_TABLET | Freq: Every day | ORAL | Status: DC
  Filled 2021-05-05: qty 1

## 2021-05-05 MED ORDER — ACETAMINOPHEN 500 MG PO TABS: 1000.0000 mg | ORAL_TABLET | Freq: Once | ORAL | Status: DC

## 2021-05-05 MED ORDER — FUROSEMIDE 10 MG/ML IJ SOLN
20.0000 mg | Freq: Once | INTRAMUSCULAR | Status: AC
  Administered 2021-05-05: 23:00:00 20 mg via INTRAVENOUS
  Filled 2021-05-05: qty 2

## 2021-05-05 MED ORDER — INSULIN ASPART 100 UNIT/ML IV SOLN
10.0000 [IU] | Freq: Once | INTRAVENOUS | Status: AC
  Administered 2021-05-05: 23:00:00 10 [IU] via INTRAVENOUS
  Filled 2021-05-05: qty 0.1

## 2021-05-05 NOTE — ED Notes (Signed)
Patient to CT at this time

## 2021-05-05 NOTE — Plan of Care (Signed)

## 2021-05-05 NOTE — ED Triage Notes (Signed)
Patient to ED from [facility] for [facility]. Patient was found outside after an unknown amount of time. Patient ws confused with a BP initially of 85/62 and cbg of 419.

## 2021-05-05 NOTE — ED Notes (Signed)
Patient cath for urine sample. Vaginal prolapse noted. Leah Blazing, MD put prolapse back in place.

## 2021-05-05 NOTE — ED Provider Notes (Signed)
[facility] Emergency Department Provider Note ____________________________________________   Event Date/Time   First MD Initiated Contact with Patient 05/05/21 1743     (approximate)  I have reviewed the triage vital signs and the nursing notes.  HISTORY  Chief Complaint Altered Mental Status   HPI Leah Ritter is a 70 y.o. femalewho presents to the ED for evaluation of altered mentation.   Chart review indicates hx atrial fibrillation on Xarelto, CKD, obesity, HTN, DM and GERD.Marland Kitchen  Patient presents to the ED from her local SNF for evaluation of altered mentation and hypotension. She was reportedly outside sitting in the sun in a rocking chair for about 1.5 to 2 hours this afternoon.  When the facility staff went to get the patient, she was too weak to stand (she typically ambulates) and could not get up.  BP was noted to be low and so patient was brought to the ED for evaluation.  Patient unable to provide any relevant history due to her disorientation, dementia and altered mentation.  Later, when daughter arrives, she reports that her mother has been not quite acting herself for about 2-3 days in a generalized fashion.  She reports poor p.o. intake, and episode of recurrent emesis 2 days ago and poor appetite.  Past Medical History:  Diagnosis Date   A-fib (HCC)    Anxiety    CHF (congestive heart failure) (HCC)    Diabetes (HCC)    GERD (gastroesophageal reflux disease)    Hypertension    Sleep apnea    Stroke [facility])     Patient Active Problem List   Diagnosis Date Noted   AMS (altered mental status) 05/05/2021   SIRS (systemic inflammatory response syndrome) (HCC) 01/09/2019   Acute on chronic systolic CHF (congestive heart failure) (HCC) 01/09/2019   Pain in joint involving ankle and foot 01/03/2018   Localized, primary osteoarthritis of shoulder region 01/03/2018   Rupture of tendon of biceps, long head 01/03/2018   Disorder of bursae of  shoulder region 01/03/2018   Screening declined by patient 10/25/2017   Full thickness rotator cuff tear 04/21/2016   Chronic bilateral low back pain without sciatica 02/10/2016   H/O rotator cuff tear 02/10/2016   History of vitamin D deficiency 02/10/2016   Type 2 diabetes mellitus without complication, with long-term current use of insulin (HCC) 02/10/2016   Diabetes (HCC) 11/24/2006   HLD (hyperlipidemia) 11/24/2006   OBESITY, NOS 11/24/2006   POST TRAUMATIC STRESS DISORDER 11/24/2006   DEPRESSIVE DISORDER, NOS 11/24/2006   HYPERTENSION, BENIGN SYSTEMIC 11/24/2006   RHINITIS, ALLERGIC 11/24/2006   GERD (gastroesophageal reflux disease) 11/24/2006   OSTEOARTHRITIS, MULTI SITES 11/24/2006   APNEA, SLEEP 11/24/2006    Past Surgical History:  Procedure Laterality Date   PARTIAL HYSTERECTOMY     tubligation      Prior to Admission medications   Medication Sig Start Date End Date Taking? Authorizing Provider  amLODipine (NORVASC) 5 MG tablet amlodipine 5 mg tablet    [provider]  apixaban (ELIQUIS) 5 MG TABS tablet Take 5 mg by mouth daily.  08/21/18   [provider]  aspirin EC 81 MG tablet Take 81 mg by mouth daily.    [provider]  atorvastatin (LIPITOR) 80 MG tablet 80 mg daily at 6 PM.     [provider]  CONTOUR NEXT TEST test strip CHECK BLOOD SUGAR TID UTD 11/14/17   [provider]  donepezil (ARICEPT) 5 MG tablet Take 5 mg by  mouth at bedtime.    [provider]  fluticasone Aleda Grana) 50 MCG/ACT nasal spray  08/29/18   [provider]  Fluticasone-Umeclidin-Vilant (TRELEGY ELLIPTA) 100-62.5-25 MCG/INH AEPB Inhale 1 puff into the lungs daily. Patient not taking: Reported on 01/10/2019 03/13/18   Yevonne Pax, MD  furosemide (LASIX) 20 MG tablet Take 40 mg by mouth daily.  03/17/18 03/17/19  [provider]  glipiZIDE (GLUCOTROL) 10 MG tablet Take 10 mg by mouth 2 (two) times daily before a meal.      [provider]  losartan (COZAAR) 100 MG tablet Take 100 mg by mouth daily.    [provider]  metoprolol succinate (TOPROL-XL) 100 MG 24 hr tablet Take 100 mg by mouth daily. 09/14/18   [provider]  metoprolol succinate (TOPROL-XL) 50 MG 24 hr tablet Take 50 mg by mouth daily. 11/24/18   [provider]  mirtazapine (REMERON SOL-TAB) 15 MG disintegrating tablet Take 15 mg by mouth at bedtime. 11/24/18   [provider]  potassium chloride (K-DUR) 10 MEQ tablet Take 1 tablet (10 mEq total) by mouth daily. 01/10/19   Altamese Dilling, MD  QUEtiapine (SEROQUEL) 25 MG tablet Take 25 mg by mouth daily.    [provider]  Travoprost, BAK Free, (TRAVATAN Z) 0.004 % SOLN ophthalmic solution  05/10/17   [provider]  VENTOLIN HFA 108 (90 Base) MCG/ACT inhaler INL 2 INHALATIONS ITL Q 4 H PRF WHZ 11/27/17   [provider]  Vitamin D, Ergocalciferol, 2000 units CAPS Take by mouth.    [provider]    Allergies Oxycodone-acetaminophen, Ezetimibe, Lisinopril, and Statins  Family History  Problem Relation Age of Onset   Breast cancer Mother    Stroke Father    Hypertension Father    Kidney disease Brother    Cancer Brother     Social History Social History   Tobacco Use   Smoking status: Never   Smokeless tobacco: Never  Vaping Use   Vaping Use: Never used  Substance Use Topics   Alcohol use: Not Currently   Drug use: Never    Review of Systems  Unable to provide any relevant history due to her disorientation ____________________________________________   PHYSICAL EXAM:  VITAL SIGNS: Vitals:   05/05/21 2010 05/05/21 2030  BP: (!) 101/50 102/68  Pulse: 95 98  Resp:  18  Temp:    SpO2: 100% 100%      Constitutional: Somnolent, awakening briefly to loud vocal or noxious stimulation. Moves all 4 and follows simple commands, such as thumbs up, without apparent deficit. Eyes:  Conjunctivae are normal. PERRL. EOMI. Head: Atraumatic. Nose: No congestion/rhinnorhea. Mouth/Throat: Mucous membranes are dry.  Oropharynx non-erythematous. Neck: No stridor. No cervical spine tenderness to palpation. Cardiovascular: Normal rate, regular rhythm. Grossly normal heart sounds.  Good peripheral circulation. Respiratory: Normal respiratory effort.  No retractions. Lungs CTAB. Gastrointestinal: Soft , nondistended, nontender to palpation. No CVA tenderness. Vaginal prolapse is noted.  Pink, soft and reducible.  Of note, I have to reduce this in order for nurse to get their cath urine Musculoskeletal: No lower extremity tenderness.  No joint effusions. No signs of acute trauma. Neurologic:  No gross focal neurologic deficits are appreciated.  Skin:  Skin is warm, dry and intact. No rash noted. Psychiatric: Mood and affect are normal. Speech and behavior are normal.  ____________________________________________   LABS (all labs ordered are listed, but only abnormal results are displayed)  Labs Reviewed  URINALYSIS, COMPLETE (  UACMP) WITH MICROSCOPIC - Abnormal; Notable for the following components:      Result Value   Color, Urine YELLOW (*)    APPearance HAZY (*)    Glucose, UA >=500 (*)    Hgb urine dipstick MODERATE (*)    Bacteria, UA FEW (*)    All other components within normal limits  COMPREHENSIVE METABOLIC PANEL - Abnormal; Notable for the following components:   Sodium 132 (*)    Potassium 6.1 (*)    Glucose, Bld 458 (*)    BUN 58 (*)    Creatinine, Ser 2.62 (*)    Calcium 8.8 (*)    GFR, Estimated 19 (*)    All other components within normal limits  CBC WITH DIFFERENTIAL/PLATELET - Abnormal; Notable for the following components:   RBC 3.17 (*)    Hemoglobin 9.5 (*)    HCT 29.2 (*)    Neutro Abs 8.4 (*)    All other components within normal limits  RESP PANEL BY RT-PCR (FLU A&B, COVID) ARPGX2  BLOOD GAS, VENOUS  BRAIN NATRIURETIC PEPTIDE    ____________________________________________  12 Lead EKG  Regular rhythm that appears to be sinus, rate of 100 bpm.  Normal axis and intervals.  No STEMI. ____________________________________________  RADIOLOGY  ED MD interpretation: 1 view CXR reviewed by me with cardiomegaly without infiltration  Official radiology report(s): CT HEAD WO CONTRAST ( )  Result Date: 05/05/2021 CLINICAL DATA:  Mental status change, unknown cause altered, nonfocal exam. eval ICH. on xarelto EXAM: CT HEAD WITHOUT CONTRAST TECHNIQUE: Contiguous axial images were obtained from the base of the skull through the vertex without intravenous contrast. COMPARISON:  Head CT 06/17/2019 FINDINGS: Brain: No acute intracranial hemorrhage. Stable degree of atrophy and chronic small vessel ischemia. Right frontal encephalomalacia is unchanged from prior exam. No evidence of acute ischemia. No hydrocephalus. No midline shift or mass effect. No subdural or extra-axial collection. Vascular: Atherosclerosis of skullbase vasculature without hyperdense vessel or abnormal calcification. Skull: No fracture or focal lesion. Sinuses/Orbits: Paranasal sinuses and mastoid air cells are clear. The visualized orbits are unremarkable. Other: None. IMPRESSION: 1. No acute intracranial abnormality. 2. Stable atrophy, chronic small vessel ischemia, and right frontal encephalomalacia. Electronically Signed   By: Narda Rutherford M.D.   On: 05/05/2021 20:08   DG Chest Portable 1 View  Result Date: 05/05/2021 CLINICAL DATA:  altered, eval infiltrate EXAM: PORTABLE CHEST 1 VIEW COMPARISON:  01/09/2019 FINDINGS: Cardiomegaly. Mild vascular congestion. No overt edema. No confluent opacities or effusions. IMPRESSION: Cardiomegaly, vascular congestion. Electronically Signed   By: Charlett Nose M.D.   On: 05/05/2021 19:04    ____________________________________________   PROCEDURES and INTERVENTIONS  Procedure(s) performed (including Critical  Care):  .1-3 Lead EKG Interpretation  Date/Time: 05/05/2021 8:09 PM Performed by: Delton Prairie, MD Authorized by: Delton Prairie, MD     Interpretation: normal     ECG rate:  80   ECG rate assessment: normal     Rhythm: atrial fibrillation     Ectopy: none     Conduction: normal    Medications  sodium chloride 0.9 % bolus 500 mL (0 mLs Intravenous Stopped 05/05/21 1914)  lactated ringers bolus 1,000 mL (1,000 mLs Intravenous New Bag/Given 05/05/21 1915)    ____________________________________________   MDM / ED COURSE   70 year old female presents from local SNF with increased altered mentation, possibly metabolic in the setting of an AKI, requiring medical admission.  Soft blood pressures, improving with IV fluids.  Exam is nonfocal without evidence  of neurologic or vascular deficits, or trauma.  Blood work with AKI that appears prerenal.  Blood work otherwise unremarkable.  Food urine without infectious features.  No evidence of DKA in the setting of her hyperglycemia as she has no acidosis.  Possibly a degree of HHS.  CT head without evidence of acute intracranial pathology.  We will admit to medicine for further work-up and management.  Clinical Course as of 05/05/21 2043  Tue May 05, 2021  1800 I have to help nurses get cath urine due to her concomitant vaginal prolapse requiring reduction for urine to produce [DS]  1823 I discuss w daughter, who is now at the bedside [DS]  1920 Reassessed.  Clinically similar.  Patient fast asleep with soft blood pressures, fluids ongoing.  I discussed metabolic panel results with daughter and we discussed medical admission for AKI. Awaiting CT head  [DS]    Clinical Course User Index [DS] Delton Prairie, MD    ____________________________________________   FINAL CLINICAL IMPRESSION(S) / ED DIAGNOSES  Final diagnoses:  AKI (acute kidney injury) (HCC)  Vaginal prolapse  Altered mental status, unspecified altered mental status type   Hyperglycemia     ED Discharge Orders     None        Marcine Gadway Mogan   Note:  This document was prepared using Dragon voice recognition software and may include unintentional dictation errors.    Delton Prairie, MD 05/05/21 2045

## 2021-05-05 NOTE — H&P (Signed)
History and Physical  Leah Ritter YBO:[id number] DOB: [birth year] DOA: 05/05/2021  Referring physician: Dr. Katrinka Blazing, EDP.   PCP: Mortimer Fries, PA  Outpatient Specialists: Cardiology. Patient coming from: SNF.  Chief Complaint: Altered mental status and lethargy.  HPI: Leah Ritter is a 70 y.o. female with medical history significant for dementia, prior CVA in 2019, chronic systolic CHF, essential hypertension, type 2 diabetes, who presented to [facility] ED from SNF due to altered mental status and lethargy x 5 days.  History is mainly obtained from the patient's daughter, Ms Joseph Art, via phone (who is also her medical power of attorney), EDP, and review of medical records.  Patient was last known well prior to Friday when she vomited a lot x1 occurrence.  She was not taken to a healthcare institution.  She felt better afterwards and she was monitored.  However she slept most of the time which is unusual for her.  Reportedly on yesterday she did not get up to eat lunch however today she was able to get up to eat her breakfast.  She was taken outside around noon and stayed out in the sun until close to 4 PM when her daughter saw her outside in the sun, sweaty under her clothes, and minimally responsive.  EMS was activated.  The patient was brought into the ED for further evaluation.  Lethargic on presentation.  CT head was nonacute.  Electrolyte disturbances noted with hyperkalemia and elevated creatinine level suggestive of prerenal AKI.  She received IV fluid hydration in the ED.  COVID-19 screening test was negative.  EDP requested admission for further work-up and management of her altered mental status, electrolytes disturbances, and AKI.  ED Course:  Temperature T-max 99.0.  BP 112/70, pulse 99, respiration rate 18, O2 saturation 100% on room air.  Lab studies significant for serum sodium 132, potassium 6.1, serum bicarb 23, serum glucose 458, BUN 58, creatinine 2.62, anion gap 11.  WBC 10.5, hemoglobin 9.5,  MCV 92, platelet count 244.  Review of Systems: Review of systems as noted in the HPI. All other systems reviewed and are negative.   Past Medical History:  Diagnosis Date   A-fib (HCC)    Anxiety    CHF (congestive heart failure) (HCC)    Diabetes (HCC)    GERD (gastroesophageal reflux disease)    Hypertension    Sleep apnea    Stroke [facility])    Past Surgical History:  Procedure Laterality Date   PARTIAL HYSTERECTOMY     tubligation      Social History:  reports that she has never smoked. She has never used smokeless tobacco. She reports previous alcohol use. She reports that she does not use drugs.   Allergies  Allergen Reactions   Oxycodone-Acetaminophen Nausea And Vomiting    Other reaction(s): Vomiting   Ezetimibe     Other reaction(s): Unknown   Lisinopril Other (See Comments)   Statins     Other reaction(s): Other (See Comments)    Family History  Problem Relation Age of Onset   Breast cancer Mother    Stroke Father    Hypertension Father    Kidney disease Brother    Cancer Brother       Prior to Admission medications   Medication Sig Start Date End Date Taking? Authorizing Provider  amLODipine (NORVASC) 5 MG tablet amlodipine 5 mg tablet    [provider]  apixaban (ELIQUIS) 5 MG TABS tablet Take 5 mg by mouth daily.  08/21/18   [provider]  aspirin EC 81 MG tablet Take 81 mg by mouth daily.    [provider]  atorvastatin (LIPITOR) 80 MG tablet 80 mg daily at 6 PM.     [provider]  CONTOUR NEXT TEST test strip CHECK BLOOD SUGAR TID UTD 11/14/17   [provider]  donepezil (ARICEPT) 5 MG tablet Take 5 mg by mouth at bedtime.    [provider]  fluticasone Aleda Grana) 50 MCG/ACT nasal spray  08/29/18   [provider]  Fluticasone-Umeclidin-Vilant (TRELEGY ELLIPTA) 100-62.5-25 MCG/INH AEPB Inhale 1 puff into the lungs daily. Patient not taking: Reported on 01/10/2019 03/13/18   Yevonne Pax, MD  furosemide (LASIX) 20 MG tablet Take 40 mg by mouth daily.  03/17/18 03/17/19  [provider]  glipiZIDE (GLUCOTROL) 10 MG tablet Take 10 mg by mouth 2 (two) times daily before a meal.     [provider]  losartan (COZAAR) 100 MG tablet Take 100 mg by mouth daily.    [provider]  metoprolol succinate (TOPROL-XL) 100 MG 24 hr tablet Take 100 mg by mouth daily. 09/14/18   [provider]  metoprolol succinate (TOPROL-XL) 50 MG 24 hr tablet Take 50 mg by mouth daily. 11/24/18   [provider]  mirtazapine (REMERON SOL-TAB) 15 MG disintegrating tablet Take 15 mg by mouth at bedtime. 11/24/18   [provider]  potassium chloride (K-DUR) 10 MEQ tablet Take 1 tablet (10 mEq total) by mouth daily. 01/10/19   Altamese Dilling, MD  QUEtiapine (SEROQUEL) 25 MG tablet Take 25 mg by mouth daily.    [provider]  Travoprost, BAK Free, (TRAVATAN Z) 0.004 % SOLN ophthalmic solution  05/10/17   [provider]  VENTOLIN HFA 108 (90 Base) MCG/ACT inhaler INL 2 INHALATIONS ITL Q 4 H PRF WHZ 11/27/17   [provider]  Vitamin D, Ergocalciferol, 2000 units CAPS Take by mouth.    [provider]    Physical Exam: BP 112/70   Pulse 99   Temp 99 F (37.2 C) (Axillary)   Resp 18   Ht 5\' 9"  (1.753 m)   Wt 97.4 kg   SpO2 100%   BMI 31.71 kg/m   General: 69 y.o. year-old female well developed well nourished in no acute distress.  Lethargic but arousable to voice. Cardiovascular: Regular rate and rhythm with no rubs or gallops.  No thyromegaly or JVD noted.  Trace lower extremity edema. 2/4 pulses in all 4 extremities. Respiratory: Mild diffused rales bilaterally. Poor inspiratory effort. Abdomen: Soft nontender nondistended with normal bowel sounds x4 quadrants. Muskuloskeletal: No cyanosis or clubbing.  Trace lower extremities edema bilaterally. Neuro: CN II-XII intact, strength, sensation,  reflexes Skin: No ulcerative lesions noted or rashes.  Hyperpigmentation involving lower extremities at the level of ankles. Psychiatry: Judgement and insight appear altered.  Unable to assess mood due to lethargy.         Labs on Admission:  Basic Metabolic Panel: Recent Labs  Lab 05/05/21 1754  NA 132*  K 6.1*  CL 98  CO2 23  GLUCOSE 458*  BUN 58*  CREATININE 2.62*  CALCIUM 8.8*   Liver Function Tests: Recent Labs  Lab 05/05/21 1754  AST 33  ALT 18  ALKPHOS 85  BILITOT 1.0  PROT 7.5  ALBUMIN 3.5   No results for input(s): LIPASE, AMYLASE in the last 168 hours. No results for input(s): AMMONIA in the last 168 hours. CBC: Recent Labs  Lab 05/05/21 1754  WBC 10.5  NEUTROABS 8.4*  HGB 9.5*  HCT 29.2*  MCV 92.1  PLT 244   Cardiac Enzymes: No results for input(s): CKTOTAL, CKMB, CKMBINDEX, TROPONINI in the last 168 hours.  BNP (last 3 results) No results for input(s): BNP in the last 8760 hours.  ProBNP (last 3 results) No results for input(s): PROBNP in the last 8760 hours.  CBG: No results for input(s): GLUCAP in the last 168 hours.  Radiological Exams on Admission: CT HEAD WO CONTRAST ( )  Result Date: 05/05/2021 CLINICAL DATA:  Mental status change, unknown cause altered, nonfocal exam. eval ICH. on xarelto EXAM: CT HEAD WITHOUT CONTRAST TECHNIQUE: Contiguous axial images were obtained from the base of the skull through the vertex without intravenous contrast. COMPARISON:  Head CT 06/17/2019 FINDINGS: Brain: No acute intracranial hemorrhage. Stable degree of atrophy and chronic small vessel ischemia. Right frontal encephalomalacia is unchanged from prior exam. No evidence of acute ischemia. No hydrocephalus. No midline shift or mass effect. No subdural or extra-axial collection. Vascular: Atherosclerosis of skullbase vasculature without hyperdense vessel or abnormal calcification. Skull: No fracture or focal lesion. Sinuses/Orbits: Paranasal sinuses and  mastoid air cells are clear. The visualized orbits are unremarkable. Other: None. IMPRESSION: 1. No acute intracranial abnormality. 2. Stable atrophy, chronic small vessel ischemia, and right frontal encephalomalacia. Electronically Signed   By: Narda Rutherford M.D.   On: 05/05/2021 20:08   DG Chest Portable 1 View  Result Date: 05/05/2021 CLINICAL DATA:  altered, eval infiltrate EXAM: PORTABLE CHEST 1 VIEW COMPARISON:  01/09/2019 FINDINGS: Cardiomegaly. Mild vascular congestion. No overt edema. No confluent opacities or effusions. IMPRESSION: Cardiomegaly, vascular congestion. Electronically Signed   By: Charlett Nose M.D.   On: 05/05/2021 19:04    EKG: I independently viewed the EKG done and my findings are as followed: Sinus tachycardia rate of 100.  Nonspecific ST-T changes.  QTc 449.  Assessment/Plan Present on Admission:  AMS (altered mental status)  Active Problems:   AMS (altered mental status)  Acute metabolic encephalopathy, unclear etiology, suspect multifactorial Rule out active infective process UA no clear evidence of pyuria with few bacteria, follow urine culture Follow peripheral blood cultures x 2 Report of significant amount of vomiting on Friday, 5 days ago in the nursing home, unclear if she aspirated.  Cover empirically with Unasyn x5 days or until pneumonia is ruled out. Mild hypercarbia with PCO2 47 on Venous BG 8/9/222.  Wear BIPAP tonight if can tolerate to avoid worsening of hypercarbia in the setting of pulmonary edema. Avoid sedative agents,  hold off psych meds tonight.  Suspected aspiration pneumonia, POA Reviewed chest x-ray done on admission which shows bilateral pulmonary infiltrates, cannot definitely rule out pneumonia or pneumonitis from possible aspiration. Obtain procalcitonin level Covering with Unasyn empirically Aspiration precautions Speech therapist evaluation N.p.o. until more alert  Suspected acute on chronic systolic CHF Chest x-ray  suggestive of cardiomegaly and pulmonary edema, personally reviewed. Obtain BNP and 2D echo Hold off cardiac medications for now due to soft BPs to avoid hypotension. Will give a dose of IV Lasix 20 mg x 1 Closely monitor volume status Start strict I's and O's and daily weight  Hyperkalemia in the setting of acute renal insufficiency Presented with serum potassium of 6.1, no peaked T waves on twelve-lead EKG. Held off IV calcium gluconate. Treated with IV insulin and D50 Repeat serum potassium level  Type 2 diabetes with hyperglycemia Presented with serum glucose greater than 450 Obtain hemoglobin A1c Start  insulin sliding scale every 4 hours while NPO Monitor CBG, treat hyperglycemia accordingly N.p.o. until she is more alert.  AKI, likely prerenal in the setting of dehydration from poor oral intake. At baseline creatinine 0.7 with GFR greater than 60 Presented with creatinine of 2.62 with GFR 19 Received IV fluid hydration in the ED We will hold off IV fluid due to cardiomegaly and pulmonary edema on chest x-ray. Avoid nephrotoxic agents and hypotension.  Chronic A. fib on Xarelto Rate is currently controlled Hold off rate control agents due to soft blood pressures to avoid hypotension Resume prior to admission Xarelto Continue to closely monitor on telemetry  Chronic normocytic anemia Hemoglobin 9.5 with baseline hemoglobin of 11 No overt bleeding If anemia worsens consider further work-up. Maintain hemoglobin above 8 in the setting of underlying cardiac disease.  History of prior CVA right frontal encephalomalacia/vascular dementia Reorient as needed Aspiration/fall/delirium precautions PT OT assessment 1 more alert    DVT prophylaxis: Xarelto.  Code Status: Full code as stated by her daughter via phone.  Family Communication: Updated her daughter who is also her medical power of attorney via phone.  Disposition Plan: Likely return to SNF once symptomatology  has improved.  Consults called: None  Admission status: Inpatient status.  Patient will require at least 2 midnights for further evaluation and treatment of present condition.   Status is: Inpatient   Dispo:  Patient From: Skilled Nursing Facility  Planned Disposition: Skilled Nursing Facility on 05/07/2021.  Medically stable for discharge: No         Darlin Drop MD [facility] Pager [phone]  If 7PM-7AM, please contact night-coverage [email or web address] Password [facility]  05/05/2021, 9:36 PM

## 2021-05-06 DIAGNOSIS — N179 Acute kidney failure, unspecified: Secondary | ICD-10-CM

## 2021-05-06 DIAGNOSIS — R739 Hyperglycemia, unspecified: Secondary | ICD-10-CM

## 2021-05-06 LAB — TROPONIN I (HIGH SENSITIVITY)
Troponin I (High Sensitivity): 23 ng/L — ABNORMAL HIGH (ref <18)
Troponin I (High Sensitivity): 22 ng/L — ABNORMAL HIGH (ref <18)

## 2021-05-06 LAB — RENAL FUNCTION PANEL
Albumin: 3.4 g/dL — ABNORMAL LOW (ref 3.5–5.0)
Anion gap: 11 (ref 5–15)
BUN: 51 mg/dL — ABNORMAL HIGH (ref 8–23)
CO2: 27 mmol/L (ref 22–32)
Calcium: 9.6 mg/dL (ref 8.9–10.3)
Chloride: 102 mmol/L (ref 98–111)
Creatinine, Ser: 1.96 mg/dL — ABNORMAL HIGH (ref 0.44–1.00)
GFR, Estimated: 27 mL/min — ABNORMAL LOW (ref >60)
Glucose, Bld: 107 mg/dL — ABNORMAL HIGH (ref 70–99)
Phosphorus: 3.1 mg/dL (ref 2.5–4.6)
Potassium: 5.3 mmol/L — ABNORMAL HIGH (ref 3.5–5.1)
Sodium: 140 mmol/L (ref 135–145)

## 2021-05-06 LAB — BASIC METABOLIC PANEL
Anion gap: 10 (ref 5–15)
BUN: 55 mg/dL — ABNORMAL HIGH (ref 8–23)
CO2: 24 mmol/L (ref 22–32)
Calcium: 8.9 mg/dL (ref 8.9–10.3)
Chloride: 100 mmol/L (ref 98–111)
Creatinine, Ser: 2.35 mg/dL — ABNORMAL HIGH (ref 0.44–1.00)
GFR, Estimated: 22 mL/min — ABNORMAL LOW (ref >60)
Glucose, Bld: 426 mg/dL — ABNORMAL HIGH (ref 70–99)
Potassium: 4.7 mmol/L (ref 3.5–5.1)
Sodium: 134 mmol/L — ABNORMAL LOW (ref 135–145)

## 2021-05-06 LAB — GLUCOSE, CAPILLARY
Glucose-Capillary: 186 mg/dL — ABNORMAL HIGH (ref 70–99)
Glucose-Capillary: 116 mg/dL — ABNORMAL HIGH (ref 70–99)
Glucose-Capillary: 173 mg/dL — ABNORMAL HIGH (ref 70–99)
Glucose-Capillary: 248 mg/dL — ABNORMAL HIGH (ref 70–99)
Glucose-Capillary: 197 mg/dL — ABNORMAL HIGH (ref 70–99)

## 2021-05-06 LAB — PROCALCITONIN: Procalcitonin: 0.2 ng/mL

## 2021-05-06 LAB — LACTIC ACID, PLASMA
Lactic Acid, Venous: 2 mmol/L (ref 0.5–1.9)
Lactic Acid, Venous: 1.4 mmol/L (ref 0.5–1.9)

## 2021-05-06 LAB — AMMONIA: Ammonia: 10 umol/L (ref 9–35)

## 2021-05-06 LAB — BRAIN NATRIURETIC PEPTIDE: B Natriuretic Peptide: 24.4 pg/mL (ref 0.0–100.0)

## 2021-05-06 LAB — HEMOGLOBIN A1C: Hgb A1c MFr Bld: 10.5 % — ABNORMAL HIGH (ref 4.8–5.6)

## 2021-05-06 MED ORDER — INSULIN ASPART 100 UNIT/ML IJ SOLN
3.0000 [IU] | Freq: Three times a day (TID) | INTRAMUSCULAR | Status: DC
  Administered 2021-05-06 – 2021-05-08 (×6): 3 [IU] via SUBCUTANEOUS
  Filled 2021-05-06 (×6): qty 1

## 2021-05-06 MED ORDER — MIRTAZAPINE 15 MG PO TBDP
15.0000 mg | ORAL_TABLET | Freq: Every day | ORAL | Status: DC
  Administered 2021-05-06 – 2021-05-07 (×2): 15 mg via ORAL
  Filled 2021-05-06 (×3): qty 1

## 2021-05-06 MED ORDER — QUETIAPINE FUMARATE 25 MG PO TABS
25.0000 mg | ORAL_TABLET | Freq: Every day | ORAL | Status: DC
  Administered 2021-05-07 – 2021-05-08 (×2): 25 mg via ORAL
  Filled 2021-05-06 (×2): qty 1

## 2021-05-06 MED ORDER — INSULIN ASPART 100 UNIT/ML IJ SOLN: 0.0000 [IU] | Freq: Every day | INTRAMUSCULAR | Status: DC

## 2021-05-06 MED ORDER — AMOXICILLIN-POT CLAVULANATE 875-125 MG PO TABS
1.0000 | ORAL_TABLET | Freq: Two times a day (BID) | ORAL | Status: DC
  Administered 2021-05-06 – 2021-05-08 (×4): 1 via ORAL
  Filled 2021-05-06 (×4): qty 1

## 2021-05-06 MED ORDER — TRAZODONE HCL 50 MG PO TABS: 25.0000 mg | ORAL_TABLET | Freq: Every evening | ORAL | Status: DC | PRN

## 2021-05-06 MED ORDER — SODIUM ZIRCONIUM CYCLOSILICATE 10 G PO PACK
10.0000 g | PACK | Freq: Every day | ORAL | Status: DC
  Filled 2021-05-06 (×2): qty 1

## 2021-05-06 MED ORDER — RIVAROXABAN 15 MG PO TABS
15.0000 mg | ORAL_TABLET | Freq: Every day | ORAL | Status: DC
  Administered 2021-05-06: 18:00:00 15 mg via ORAL
  Filled 2021-05-06 (×2): qty 1

## 2021-05-06 MED ORDER — HALOPERIDOL LACTATE 5 MG/ML IJ SOLN: 1.0000 mg | Freq: Four times a day (QID) | INTRAMUSCULAR | Status: DC | PRN

## 2021-05-06 MED ORDER — INSULIN ASPART 100 UNIT/ML IJ SOLN
0.0000 [IU] | Freq: Three times a day (TID) | INTRAMUSCULAR | Status: DC
  Administered 2021-05-06: 17:00:00 5 [IU] via SUBCUTANEOUS
  Administered 2021-05-07 (×2): 3 [IU] via SUBCUTANEOUS
  Administered 2021-05-07: 5 [IU] via SUBCUTANEOUS
  Administered 2021-05-08: 3 [IU] via SUBCUTANEOUS
  Administered 2021-05-08: 5 [IU] via SUBCUTANEOUS
  Filled 2021-05-06 (×6): qty 1

## 2021-05-06 MED ORDER — SODIUM CHLORIDE 0.9 % IV SOLN
3.0000 g | Freq: Two times a day (BID) | INTRAVENOUS | Status: DC
  Administered 2021-05-06: 11:00:00 3 g via INTRAVENOUS
  Filled 2021-05-06 (×2): qty 8

## 2021-05-06 MED ORDER — METOPROLOL SUCCINATE ER 50 MG PO TB24
50.0000 mg | ORAL_TABLET | Freq: Every day | ORAL | Status: DC
  Administered 2021-05-06 – 2021-05-08 (×3): 50 mg via ORAL
  Filled 2021-05-06 (×3): qty 1

## 2021-05-06 NOTE — Progress Notes (Signed)
PROGRESS NOTE    Leah Ritter  GGE:[id number] DOB: September 02, 1951 DOA: 05/05/2021 PCP: Mortimer Fries, PA   Brief Narrative: Taken from H&P. Leah Ritter is a 70 y.o. female with medical history significant for dementia, prior CVA in 2019, chronic systolic CHF, essential hypertension, type 2 diabetes, who presented to [facility] ED from SNF due to altered mental status and lethargy x 5 days.  Apparently had a projectile vomiting on Friday. Apparently daughter found her outside in the sun, sweaty under her clothes and minimally responsive.  EMS was activated.  In ED CT head was without any acute abnormality.  Some electrolyte disturbances which include hyperkalemia and elevated creatinine, most likely due to prerenal AKI.  She received IV fluid in ED.  COVID-19 was negative. Chest x-ray with bilateral infiltrate/vascular congestion and could not definitely ruled out pneumonia.  Procalcitonin at 0.20.  She was started on Unasyn for concern of aspiration pneumonia due to her history of projectile vomiting on Friday.  Daughter does not want her to go back to that facility.  Patient requires 24/7 care.  Subjective: Patient was seen and examined today.  Daughter at bedside.  Per daughter she is now very close to her baseline, at baseline she is oriented to self only.  Started eating and drinking.  Assessment & Plan:   Active Problems:   AMS (altered mental status)  Acute metabolic encephalopathy.  Might be due to dehydration and AKI.  Now at baseline. Blood cultures negative, urine cultures pending but UA was not very impressive. -Follow-up urine culture. -Continue to monitor  Suspected aspiration pneumonia, POA.  Chest x-ray with bilateral pulmonary infiltrate/vascular congestion.  Cannot definitely rule out pneumonia/pneumonitis from possible aspiration due to 1 episode of projectile vomiting.  Procalcitonin at 0.20.  She was started on Unasyn. Swallow evaluation with mild oral phase dysphagia secondary  to dementia. -Switch Unasyn with Augmentin and will complete 5-day course.  AKI.  Most likely secondary to dehydration.  Some improvement in creatinine but remained at 1.96 with BUN of 51.  2 years ago creatinine was less than 1.  Patient lost IV line.  Received some IV fluid. -Encourage p.o. hydration -Monitor renal function -Keep holding losartan  Hyperkalemia.  Potassium at 5.3 this morning after improving to 4.7.  It was 6.1 without any EKG changes on admission.  Received IV insulin and D50.  Apparently losartan and potassium was listed on home meds, med rec has not been completed. -Give her Lokelma -Monitor electrolytes -Keep holding losartan  Elevated troponin.  Barely positive troponin, most likely secondary to demand ischemia.  No chest pain.  BNP within normal limit. Echocardiogram ordered-pending.  Type 2 diabetes mellitus with hyperglycemia.  CBG elevated around 450 on admission.  Now improving.  A1c elevated at 10.5.  Glipizide was listed on home meds, not sure whether she was taking it or not. -Moderate SSI -3 units of NovoLog with meals  Chronic atrial fibrillation.  Rate in upper 90s.  Home dose of metoprolol was held due to softer blood pressure on presentation.  Blood pressure improved. -Restart Toprol at 50 mg/day-need med rec for additional dosing. -Continue with Xarelto  Chronic normocytic anemia Hemoglobin 9.5, no record of CBC except more than 2 years ago. No overt bleeding. -Monitor hemoglobin  History of prior CVA right frontal encephalomalacia/vascular dementia Reorient as needed Aspiration/fall/delirium precautions PT OT assessment-recommending SNF.  Daughter does not want her to go back to that facility.  Patient needs 24/7 care.  Might get benefit from memory  care unit.  Objective: Vitals:   05/05/21 2227 05/06/21 0134 05/06/21 0732 05/06/21 1205  BP: (!) 96/28 (!) 103/54 101/62 (!) 145/73  Pulse: 94 87 88 94  Resp: 16  16 18   Temp: 98.7 F (37.1  C)  98.3 F (36.8 C) (!) 97.3 F (36.3 C)  TempSrc: Oral  Oral Oral  SpO2: 100% 99% 100% 96%  Weight:      Height:        Intake/Output Summary (Last 24 hours) at 05/06/2021 1517 Last data filed at 05/06/2021 1300 Gross per 24 hour  Intake 1165.5 ml  Output 200 ml  Net 965.5 ml   Filed Weights   05/05/21 1748  Weight: 97.4 kg    Examination:  General exam: Well-developed elderly lady, appears calm and comfortable  Respiratory system: Clear to auscultation. Respiratory effort normal. Cardiovascular system: S1 & S2 heard, RRR.  Gastrointestinal system: Soft, nontender, nondistended, bowel sounds positive. Central nervous system: Alert and oriented to self only. No focal neurological deficits. Extremities: No edema, no cyanosis, pulses intact and symmetrical. Psychiatry: Judgement and insight appear impaired.   DVT prophylaxis: Xarelto Code Status: Full Family Communication: Discussed with daughter at bedside Disposition Plan:  Status is: Inpatient  Remains inpatient appropriate because:Inpatient level of care appropriate due to severity of illness  Dispo:  Patient From: Skilled Nursing Facility  Planned Disposition: Skilled Nursing Facility  Medically stable for discharge: No   Level of care: Med-Surg  All the records are reviewed and case discussed with Care Management/Social Worker. Management plans discussed with the patient, nursing and they are in agreement.  Consultants:  None  Procedures:  Antimicrobials:  Augmentin  Data Reviewed: I have personally reviewed following labs and imaging studies  CBC: Recent Labs  Lab 05/05/21 1754  WBC 10.5  NEUTROABS 8.4*  HGB 9.5*  HCT 29.2*  MCV 92.1  PLT 244   Basic Metabolic Panel: Recent Labs  Lab 05/05/21 1754 05/06/21 0003 05/06/21 0333  NA 132* 134* 140  K 6.1* 4.7 5.3*  CL 98 100 102  CO2 23 24 27   GLUCOSE 458* 426* 107*  BUN 58* 55* 51*  CREATININE 2.62* 2.35* 1.96*  CALCIUM 8.8* 8.9 9.6   PHOS  --   --  3.1   GFR: Estimated Creatinine Clearance: 32.7 mL/min (A) (by C-G formula based on SCr of 1.96 mg/dL (H)). Liver Function Tests: Recent Labs  Lab 05/05/21 1754 05/06/21 0333  AST 33  --   ALT 18  --   ALKPHOS 85  --   BILITOT 1.0  --   PROT 7.5  --   ALBUMIN 3.5 3.4*   No results for input(s): LIPASE, AMYLASE in the last 168 hours. Recent Labs  Lab 05/06/21 0003  AMMONIA 10   Coagulation Profile: No results for input(s): INR, PROTIME in the last 168 hours. Cardiac Enzymes: No results for input(s): CKTOTAL, CKMB, CKMBINDEX, TROPONINI in the last 168 hours. BNP (last 3 results) No results for input(s): PROBNP in the last 8760 hours. HbA1C: Recent Labs    05/06/21 0003  HGBA1C 10.5*   CBG: Recent Labs  Lab 05/05/21 2241 05/06/21 0426 05/06/21 0732 05/06/21 1207  GLUCAP 334* 186* 116* 173*   Lipid Profile: No results for input(s): CHOL, HDL, LDLCALC, TRIG, CHOLHDL, LDLDIRECT in the last 72 hours. Thyroid Function Tests: No results for input(s): TSH, T4TOTAL, FREET4, T3FREE, THYROIDAB in the last 72 hours. Anemia Panel: No results for input(s): VITAMINB12, FOLATE, FERRITIN, TIBC, IRON, RETICCTPCT in the  last 72 hours. Sepsis Labs: Recent Labs  Lab 05/06/21 0003 05/06/21 0333  PROCALCITON 0.20  --   LATICACIDVEN 2.0* 1.4    Recent Results (from the past 240 hour(s))  Resp Panel by RT-PCR (Flu A&B, Covid) Nasopharyngeal Swab     Status: None   Collection Time: 05/05/21  7:09 PM   Specimen: Nasopharyngeal Swab; Nasopharyngeal(NP) swabs in vial transport medium  Result Value Ref Range Status   SARS Coronavirus 2 by RT PCR NEGATIVE NEGATIVE Final    Comment: (NOTE) SARS-CoV-2 target nucleic acids are NOT DETECTED.  The SARS-CoV-2 RNA is generally detectable in upper respiratory specimens during the acute phase of infection. The lowest concentration of SARS-CoV-2 viral copies this assay can detect is 138 copies/mL. A negative result does  not preclude SARS-Cov-2 infection and should not be used as the sole basis for treatment or other patient management decisions. A negative result may occur with  improper specimen collection/handling, submission of specimen other than nasopharyngeal swab, presence of viral mutation(s) within the areas targeted by this assay, and inadequate number of viral copies(<138 copies/mL). A negative result must be combined with clinical observations, patient history, and epidemiological information. The expected result is Negative.  Fact Sheet for Patients:  BloggerCourse.com  Fact Sheet for Healthcare Providers:  SeriousBroker.it  This test is no t yet approved or cleared by the [place] FDA and  has been authorized for detection and/or diagnosis of SARS-CoV-2 by FDA under an Emergency Use Authorization (EUA). This EUA will remain  in effect (meaning this test can be used) for the duration of the COVID-19 declaration under Section 564(b)(1) of the Act, 21 U.S.C.section 360bbb-3(b)(1), unless the authorization is terminated  or revoked sooner.       Influenza A by PCR NEGATIVE NEGATIVE Final   Influenza B by PCR NEGATIVE NEGATIVE Final    Comment: (NOTE) The Xpert Xpress SARS-CoV-2/FLU/RSV plus assay is intended as an aid in the diagnosis of influenza from Nasopharyngeal swab specimens and should not be used as a sole basis for treatment. Nasal washings and aspirates are unacceptable for Xpert Xpress SARS-CoV-2/FLU/RSV testing.  Fact Sheet for Patients: BloggerCourse.com  Fact Sheet for Healthcare Providers: SeriousBroker.it  This test is not yet approved or cleared by the [place] FDA and has been authorized for detection and/or diagnosis of SARS-CoV-2 by FDA under an Emergency Use Authorization (EUA). This EUA will remain in effect (meaning this test can be used) for the  duration of the COVID-19 declaration under Section 564(b)(1) of the Act, 21 U.S.C. section 360bbb-3(b)(1), unless the authorization is terminated or revoked.  Performed at [facility], [address] [place], [place] [address]   CULTURE, BLOOD (ROUTINE X 2) w Reflex to ID Panel     Status: None (Preliminary result)   Collection Time: 05/06/21 12:03 AM   Specimen: BLOOD  Result Value Ref Range Status   Specimen Description BLOOD RIGHT [facility]  Final   Special Requests   Final    BOTTLES DRAWN AEROBIC AND ANAEROBIC Blood Culture results may not be optimal due to an inadequate volume of blood received in culture bottles   Culture   Final    NO GROWTH < 12 HOURS Performed at [facility], [address] [place], [place] [address]    Report Status PENDING  Incomplete  CULTURE, BLOOD (ROUTINE X 2) w Reflex to ID Panel     Status: None (Preliminary result)   Collection Time: 05/06/21 12:18 AM   Specimen:  BLOOD  Result Value Ref Range Status   Specimen Description BLOOD RIGHT FA  Final   Special Requests   Final    BOTTLES DRAWN AEROBIC ONLY Blood Culture results may not be optimal due to an inadequate volume of blood received in culture bottles   Culture   Final    NO GROWTH < 12 HOURS Performed at [facility], [address] [place], [place] [address]    Report Status PENDING  Incomplete     Radiology Studies: CT HEAD WO CONTRAST ( )  Result Date: 05/05/2021 CLINICAL DATA:  Mental status change, unknown cause altered, nonfocal exam. eval ICH. on xarelto EXAM: CT HEAD WITHOUT CONTRAST TECHNIQUE: Contiguous axial images were obtained from the base of the skull through the vertex without intravenous contrast. COMPARISON:  Head CT 06/17/2019 FINDINGS: Brain: No acute intracranial hemorrhage. Stable degree of atrophy and chronic small vessel ischemia. Right frontal encephalomalacia is unchanged from prior exam. No evidence of acute ischemia. No hydrocephalus.  No midline shift or mass effect. No subdural or extra-axial collection. Vascular: Atherosclerosis of skullbase vasculature without hyperdense vessel or abnormal calcification. Skull: No fracture or focal lesion. Sinuses/Orbits: Paranasal sinuses and mastoid air cells are clear. The visualized orbits are unremarkable. Other: None. IMPRESSION: 1. No acute intracranial abnormality. 2. Stable atrophy, chronic small vessel ischemia, and right frontal encephalomalacia. Electronically Signed   By: Narda Rutherford M.D.   On: 05/05/2021 20:08   DG Chest Portable 1 View  Result Date: 05/05/2021 CLINICAL DATA:  altered, eval infiltrate EXAM: PORTABLE CHEST 1 VIEW COMPARISON:  01/09/2019 FINDINGS: Cardiomegaly. Mild vascular congestion. No overt edema. No confluent opacities or effusions. IMPRESSION: Cardiomegaly, vascular congestion. Electronically Signed   By: Charlett Nose M.D.   On: 05/05/2021 19:04    Scheduled Meds:  amoxicillin-clavulanate  1 tablet Oral Q12H   insulin aspart  0-15 Units Subcutaneous Q4H   rivaroxaban  15 mg Oral Q supper   Continuous Infusions:   LOS: 1 day   Time spent: 40 minutes. More than 50% of the time was spent in counseling/coordination of care  Arnetha Courser, MD [facility]  If 7PM-7AM, please contact night-coverage [email or web address]  05/06/2021, 3:17 PM   This record has been created using Conservation officer, historic buildings. Errors have been sought and corrected,but may not always be located. Such creation errors do not reflect on the standard of care.

## 2021-05-06 NOTE — Progress Notes (Signed)
Received a call from the NT that patient had removed IV and had blood everywhere. Due to patients confusion I reached out to provider to see if it would be okay to leave out IV. MD aware and that was okay

## 2021-05-06 NOTE — Evaluation (Signed)
Clinical/Bedside Swallow Evaluation Patient Details  Name: Leah Ritter MRN: [id number] Date of Birth: [birth year]  Today's Date: 05/06/2021 Time: SLP Start Time (ACUTE ONLY): 1010 SLP Stop Time (ACUTE ONLY): 1110 SLP Time Calculation (min) (ACUTE ONLY): 60 min  Past Medical History:  Past Medical History:  Diagnosis Date   A-fib (HCC)    Anxiety    CHF (congestive heart failure) (HCC)    Diabetes (HCC)    GERD (gastroesophageal reflux disease)    Hypertension    Sleep apnea    Stroke [facility])    Past Surgical History:  Past Surgical History:  Procedure Laterality Date   PARTIAL HYSTERECTOMY     tubligation     HPI:  Pt  is a 70 y.o. female with medical history significant for Dementia(requires some A w/ ADLs per Dtr), GERD, prior CVA in 2019, chronic systolic CHF, essential hypertension, type 2 diabetes, who presented to [facility] ED from SNF due to altered mental status and lethargy x 5 days.  Patient was last known well prior to Friday, 05/01/2021, when she Vomited a large amount x1 occurrence. She was not taken to a healthcare institution at the time. She felt better afterwards and she was monitored. However, she slept most of the time which is unusual for her. Reportedly on day of admit, she did not get up to eat lunch however today she was able to get up to eat her breakfast. She was taken outside and remained outside for an extended period of time. When noted w/ decreased responsiveness, EMS was called.  HEAD CT: No acute intracranial abnormality. 2. Stable atrophy, chronic small vessel ischemia, and right frontal  encephalomalacia.  CXR: Cardiomegaly, vascular congestion.   Assessment / Plan / Recommendation Clinical Impression  Pt appears to present w/ grossly adequate oropharyngeal phase swallowing function in light of declined Cognitive status; Baseline Dementia. Pt was awake, verbally engaged w/ SLP w/ frequent perseverations of tangential, paranoid/anxious speech/thoughts re: someone  stealing/looking through w/ clothes at the NH; her money. This seemed to be baseline per Dtr present in room. It was difficult to re-direct pt at times. This presentation can impact her overall awareness/timing of swallow and safety during po tasks which increases risk for aspiration, choking. Pt's risk for aspiration is present, but it can be reduced when following general aspiration precautions, giving feeding support, and using a modified diet consistency w/ easy to eat foods; finger foods She required verbal/visual/tactile cues during po tasks.  Pt consumed several trials of ice chips, purees, softened solids, and thin liquids w/ No immediate, overt clinical s/s of aspiration noted; no decline in vocal quality; no cough except x1 when talking post drinking, and no decline in respiratory status during/post trials. Oral phase was adequate for bolus management and oral clearing of the boluses given. Min extra Time needed for full mastication and clearing of soft solids -- min more of a munch pattern mastication. Pt was able to self-feed when drinking(held cup when given it) but required min+ support and guidance when eating foods d/t Cognitive decline. OM Exam appeared grossly [facility] w/ No unilateral lingual/oral weakness noted; slight decreased tone in left corner of mouth/li[ps. Some confusion of OM tasks and oral care.    D/t pt's Baseline, declined Cognitive status/Dementia and risk for aspiration, recommend initiation of the dysphagia level 3(mech soft) w/ thin liquids via Cup; general aspiration precautions; reduce Distractions during meals and engage pt during po's at meal for self-feeding. Pills Crushed in Puree for safer swallowing. Support  w/ feeding at meals as needed. MD/NSG updated.  SLP Visit Diagnosis: Dysphagia, oral phase (R13.11) (baseline Dementia; confusion)    Aspiration Risk  Mild aspiration risk;Risk for inadequate nutrition/hydration (reduced following general aspiration precautions)     Diet Recommendation  Dysphagia level 3(mech soft) w/ thin liquids via Cup; general aspiration precautions; reduce Distractions during meals and engage pt during po's at meal for self-feeding. Support w/ feeding at meals as needed. Easy to eat foods; finger foods encouraged.  Medication Administration: Whole meds with puree (vs need to CRUSH in puree)    Other  Recommendations Recommended Consults:  (dietician f/u) Oral Care Recommendations: Oral care BID;Oral care before and after PO;Staff/trained caregiver to provide oral care (Denture care) Other Recommendations:  (n/a)   Follow up Recommendations None      Frequency and Duration  (n/a)   (n/a)       Prognosis Prognosis for Safe Diet Advancement: Fair (-good) Barriers to Reach Goals: Cognitive deficits;Language deficits;Time post onset;Severity of deficits;Behavior Barriers/Prognosis Comment: baseline Dementia      Swallow Study   General Date of Onset: 05/05/21 HPI: Pt  is a 70 y.o. female with medical history significant for Dementia(requires some A w/ ADLs per Dtr), GERD, prior CVA in 2019, chronic systolic CHF, essential hypertension, type 2 diabetes, who presented to [facility] ED from SNF due to altered mental status and lethargy x 5 days.  Patient was last known well prior to Friday, 05/01/2021, when she Vomited a large amount x1 occurrence. She was not taken to a healthcare institution at the time. She felt better afterwards and she was monitored. However, she slept most of the time which is unusual for her. Reportedly on day of admit, she did not get up to eat lunch however today she was able to get up to eat her breakfast. She was taken outside and remained outside for an extended period of time. When noted w/ decreased responsiveness, EMS was called.  HEAD CT: No acute intracranial abnormality. 2. Stable atrophy, chronic small vessel ischemia, and right frontal  encephalomalacia.  CXR: Cardiomegaly, vascular congestion. Type of Study:  Bedside Swallow Evaluation Previous Swallow Assessment: none Diet Prior to this Study: NPO (regular diet at NH per report) Temperature Spikes Noted: No (wbc 10.5) Respiratory Status: Room air History of Recent Intubation: No Behavior/Cognition: Alert;Cooperative;Pleasant mood;Confused;Agitated;Distractible;Requires cueing (perseverations; tangential speech/thoughts) Oral Cavity Assessment: Within Functional Limits (adequate for viewed areas) Oral Care Completed by SLP: Yes (pt assisted) Oral Cavity - Dentition: Dentures, top;Missing dentition (on bottom - has a partial at NH?) Vision: Functional for self-feeding Self-Feeding Abilities: Able to feed self;Needs assist;Needs set up (shaky in UEs; distracted) Patient Positioning: Upright in chair (supported) Baseline Vocal Quality: Low vocal intensity (at times; muttered/mumbled speech as well -- suspect impact from Dementia) Volitional Cough: Cognitively unable to elicit Volitional Swallow: Unable to elicit    Oral/Motor/Sensory Function Overall Oral Motor/Sensory Function: Within functional limits (grossly -- during bolus management, sensation)   Ice Chips Ice chips: Within functional limits Presentation: Spoon (fed; 2 trials)   Thin Liquid Thin Liquid: Within functional limits Presentation: Cup;Self Fed (~6 ozs)    Nectar Thick Nectar Thick Liquid: Not tested   Honey Thick Honey Thick Liquid: Not tested   Puree Puree: Within functional limits Presentation: Self Fed;Spoon (supported; ~3 ozs)   Solid     Solid: Impaired (min) Presentation: Self Fed;Spoon (supported/assisted; 8+ trials) Oral Phase Impairments: Impaired mastication (min munchy) Oral Phase Functional Implications: Prolonged oral transit;Impaired mastication (min) Pharyngeal Phase  Impairments:  (none)        Jerilynn Som, MS, [employer] Speech Language Pathologist Rehab Services [phone] [facility] 05/06/2021,1:01 PM

## 2021-05-06 NOTE — Evaluation (Signed)
Occupational Therapy Evaluation Patient Details Name: Leah Ritter MRN: [id number] DOB: 1951-04-18 Today's Date: 05/06/2021    History of Present Illness Leah Ritter is a 70 y.o. female with medical history significant for dementia, prior CVA in 2019, chronic systolic CHF, essential hypertension, type 2 diabetes, who presented to [facility] ED from ALF due to altered mental status and lethargy x 5 days.   Clinical Impression   Leah Ritter was seen for OT evaluation this date. Prior to hospital admission, pt was MOD I for mobility using 4WW and required assist for ADLs. Pt lives at [facility]. Pt presents to acute OT demonstrating impaired ADL performance and functional mobility 2/2 decreased command following, functional strength/balance deficits, and poor insight into deficits. Pt currently requires MIN cues to don B socks seated on toilet, MAX A doff dirty socks sitting on toilet. MOD A bathing seated EOC. MAX A x2 perihygiene in standing. MAX A for ADL t/f as pt unwilling to assist.   Pt currently requiring increased assistance to perform ADL tasks, daughter in room to provide encouragement however pt equally resistant to participate with staff and family. Pt would benefit from skilled OT to address noted impairments and functional limitations to maximize safety. Upon hospital discharge, recommend STR to maximize pt safety and return to PLOF.     Follow Up Recommendations  SNF;Supervision/Assistance - 24 hour    Equipment Recommendations  3 in 1 bedside commode    Recommendations for Other Services       Precautions / Restrictions Precautions Precautions: Fall (delirium) Restrictions Weight Bearing Restrictions: No      Mobility Bed Mobility Overal bed mobility: Needs Assistance Bed Mobility: Supine to Sit     Supine to sit: HOB elevated;Min assist;Min guard     General bed mobility comments: pt received on toilet and left in chair    Transfers Overall transfer level:  Needs assistance Equipment used: Rolling walker (2 wheeled);1 person hand held assist Transfers: Sit to/from Stand Sit to Stand: Max assist;+2 physical assistance         General transfer comment: pt's cognition limiting ability to participate in toilet t/f    Balance Overall balance assessment: Needs assistance Sitting-balance support: No upper extremity supported;Feet supported Sitting balance-Leahy Scale: Fair   Postural control: Right lateral lean Standing balance support: No upper extremity supported;During functional activity Standing balance-Leahy Scale: Fair Standing balance comment: Able to stand without UE support resting on RW.                           ADL either performed or assessed with clinical judgement   ADL Overall ADL's : Needs assistance/impaired                                       General ADL Comments: MIN cues to don B socks seated on toilet, MAX A doff dirty socks sitting on toilet. MOD A bathing seated EOC. MAX A x2 perihygiene in standing. MAX A for ADL t/f as pt unwilling to assist      Pertinent Vitals/Pain Pain Assessment: Faces Faces Pain Scale: Hurts a little bit Pain Location: R shin Pain Descriptors / Indicators: Discomfort;Dull Pain Intervention(s): Limited activity within patient's tolerance;Repositioned     Hand Dominance Right   Extremity/Trunk Assessment Upper Extremity Assessment Upper Extremity Assessment: Generalized weakness   Lower Extremity Assessment Lower Extremity Assessment:  Generalized weakness   Cervical / Trunk Assessment Cervical / Trunk Assessment: Normal   Communication Communication Communication:  (garbled speech)   Cognition Arousal/Alertness: Awake/alert Behavior During Therapy: Impulsive;Flat affect;Agitated Overall Cognitive Status: Difficult to assess                                 General Comments: pt is oriented to self only, mumbles about church and  getting her hair done, staffa nd family unable to redirect. Pt demonstrates poor command following and ADL sequencing   General Comments       Exercises Exercises: Other exercises Other Exercises Other Exercises: Family educated re: OT role, DME recs, d/c recs, falls prevention Other Exercises: LBD, bathing, UBD, sit<>stand, sitting/standing balance/tolerance   Shoulder Instructions      Home Living Family/patient expects to be discharged to:: Assisted living [facility])                             Home Equipment: Dan Humphreys - 4 wheels   Additional Comments: Due to baseline dementia, pt's daughter is primary historian. Per chart pt is from [facility]      Prior Functioning/Environment Level of Independence: Needs assistance  Gait / Transfers Assistance Needed: Amb household distances 4WW ADL's / Homemaking Assistance Needed: Requires assist            OT Problem List: Decreased strength;Decreased activity tolerance;Impaired balance (sitting and/or standing);Decreased cognition;Decreased safety awareness      OT Treatment/Interventions: Self-care/ADL training;Therapeutic exercise;Energy conservation;DME and/or AE instruction;Therapeutic activities;Patient/family education;Balance training    OT Goals(Current goals can be found in the care plan section) Acute Rehab OT Goals Patient Stated Goal: to return to PLOF OT Goal Formulation: With family Time For Goal Achievement: 05/20/21 Potential to Achieve Goals: Fair ADL Goals Pt Will Perform Grooming: with supervision;standing (c LRAD PRN) Pt Will Transfer to Toilet: with modified independence;ambulating;regular height toilet (c LRAD PRN) Pt Will Perform Toileting - Clothing Manipulation and hygiene: with modified independence;sitting/lateral leans  OT Frequency: Min 1X/week   Barriers to D/C: Decreased caregiver support          Co-evaluation              AM-PAC OT "6 Clicks" Daily Activity      Outcome Measure Help from another person eating meals?: None Help from another person taking care of personal grooming?: A Lot Help from another person toileting, which includes using toliet, bedpan, or urinal?: A Lot Help from another person bathing (including washing, rinsing, drying)?: A Lot Help from another person to put on and taking off regular upper body clothing?: A Lot Help from another person to put on and taking off regular lower body clothing?: A Lot 6 Click Score: 14   End of Session Equipment Utilized During Treatment: Rolling walker;Gait belt  Activity Tolerance: Patient tolerated treatment well;Treatment limited secondary to agitation Patient left: in chair;with call bell/phone within reach;with chair alarm set;with nursing/sitter in room;with family/visitor present  OT Visit Diagnosis: Other abnormalities of gait and mobility (R26.89);Muscle weakness (generalized) (M62.81)                Time: 0093-8182 OT Time Calculation (min): 27 min Charges:  OT General Charges $OT Visit: 1 Visit OT Evaluation $OT Eval Low Complexity: 1 Low OT Treatments $Self Care/Home Management : 23-37 mins  Kathie Dike, M.S. OTR/L  05/06/21, 1:29 PM  ascom [phone]

## 2021-05-06 NOTE — Progress Notes (Signed)
Pt refused Bipap. Bipap remains at the bedside. Pt RN aware.

## 2021-05-06 NOTE — Evaluation (Signed)
Physical Therapy Evaluation Patient Details Name: Leah Ritter MRN: [id number] DOB: 08-Jun-1951 Today's Date: 05/06/2021   History of Present Illness  Leah Ritter is a 70 y.o. female with medical history significant for dementia, prior CVA in 2019, chronic systolic CHF, essential hypertension, type 2 diabetes, who presented to [facility] ED from SNF due to altered mental status and lethargy x 5 days.   Clinical Impression  Pt admitted with above diagnosis. Pt received supine in bed sleeping with daughter at bedside. Daughter and pt agreeable to PT once pt awoke to voice and light touch. Pt A&Ox1 to person. Overall fidgety with hands (pt has hx of baseline dementia). Daughter primary historian on PLOF, DME, prior living arrangements. Unclear if pt at SNF or ALF prior to current acute admission? At baseline pt is household ambulatory distances with [phone] and daughter unsure if pt receives supervision with mobility at living facility where she was residing. BP monitored throughout session overall slightly elevated but pt fidgety throughout session thus probably not accurate readings. Minguard to minA provided for bed mobility and HOB elevated requiring 1 step commands and hand over hand assist to perform at that level of assist. STS with same level of assist with use of RW into hallway with hand over hand guidance due to impulsivity for safety and max VC's for redirecting attention to walking in hallway (80" total). Prior to walking pt urinated on floor so NT in room cleaning floor and linens while pt ambulating with PT. Once back in room pt resistant to sitting down and impulsively wandering in room grabbing items requiring minguard and max attempts of redirection from PT. Pt requesting bathroom so pt amb with HHAx1 to bathroom to sitting on toilet(dispalys good walking and balance with HHA). At this time OT entering room and PT transitioning pt care to OT for eval. Overall pt ambulating at what seems to be  baseline function but due to confusion, impulsivity, and disorientation pt at significant risk of falls requiring 24/7 supervision for safety with OOB mobility. Pt currently with functional limitations due to the deficits listed below (see PT Problem List). Pt will benefit from skilled PT to increase their independence and safety with mobility to allow discharge to the venue listed below.     BP readings:  Supine: 110/51 mm Hg  Seated EOB: 143/56 mm Hg ( unsure if accurate due to pt fidgeting despite cuing to relax LUE)     Follow Up Recommendations SNF;Supervision/Assistance - 24 hour    Equipment Recommendations  Other (comment) (defer to next venue of care)    Recommendations for Other Services       Precautions / Restrictions Precautions Precautions: Fall Restrictions Weight Bearing Restrictions: No      Mobility  Bed Mobility Overal bed mobility: Needs Assistance Bed Mobility: Supine to Sit     Supine to sit: HOB elevated;Min assist;Min guard     General bed mobility comments: minguard to minA for scooting at hips to sit EOB. Patient Response: Impulsive;Flat affect  Transfers Overall transfer level: Needs assistance Equipment used: Rolling walker (2 wheeled);1 person hand held assist Transfers: Sit to/from Stand Sit to Stand: Min guard         General transfer comment: VC's with TC's on gait belt "stand up". Stood with ease to RW.  Ambulation/Gait Ambulation/Gait assistance: Min guard Gait Distance (Feet): 80 Feet Assistive device: Rolling walker (2 wheeled) Gait Pattern/deviations: Step-through pattern;Trunk flexed;Narrow base of support Gait velocity: WNL's   General Gait Details: Requires max  guidance on RW to reduce impulsive movements/staggering. Mod to max VC's for redirection to focus on wlaking tasks.  Stairs            Wheelchair Mobility    Modified Rankin (Stroke Patients Only)       Balance Overall balance assessment: Needs  assistance Sitting-balance support: Feet supported;Single extremity supported Sitting balance-Leahy Scale: Fair   Postural control: Right lateral lean   Standing balance-Leahy Scale: Fair Standing balance comment: Able to stand without UE support resting on RW.                             Pertinent Vitals/Pain Pain Assessment: Faces Faces Pain Scale: Hurts a little bit Pain Location: Reports mouth/jaw pain? Pain Intervention(s): Monitored during session    Home Living Family/patient expects to be discharged to:: Skilled nursing facility                 Additional Comments: Due to baseline dementia, pt's daughter is primary historian. From ALF or SNF?    Prior Function Level of Independence: Needs assistance   Gait / Transfers Assistance Needed: Amb household distances 4WW  ADL's / Homemaking Assistance Needed: Requires assist        Hand Dominance        Extremity/Trunk Assessment   Upper Extremity Assessment Upper Extremity Assessment: Defer to OT evaluation    Lower Extremity Assessment Lower Extremity Assessment: Generalized weakness    Cervical / Trunk Assessment Cervical / Trunk Assessment: Normal  Communication      Cognition Arousal/Alertness: Awake/alert Behavior During Therapy: Impulsive;Flat affect Overall Cognitive Status: History of cognitive impairments - at baseline                                 General Comments: A&Ox1 to person. Resistant and fidgety with hands throughout session.      General Comments      Exercises     Assessment/Plan    PT Assessment Patient needs continued PT services  PT Problem List Decreased strength;Decreased mobility;Decreased safety awareness;Decreased activity tolerance;Decreased balance;Decreased knowledge of use of DME;Decreased cognition       PT Treatment Interventions DME instruction;Therapeutic exercise;Gait training;Balance training;Neuromuscular  re-education;Functional mobility training;Therapeutic activities;Patient/family education    PT Goals (Current goals can be found in the Care Plan section)  Acute Rehab PT Goals PT Goal Formulation: Patient unable to participate in goal setting    Frequency Min 2X/week   Barriers to discharge        Co-evaluation               AM-PAC PT "6 Clicks" Mobility  Outcome Measure Help needed turning from your back to your side while in a flat bed without using bedrails?: A Lot Help needed moving from lying on your back to sitting on the side of a flat bed without using bedrails?: A Lot Help needed moving to and from a bed to a chair (including a wheelchair)?: A Little Help needed standing up from a chair using your arms (e.g., wheelchair or bedside chair)?: A Little Help needed to walk in hospital room?: A Little Help needed climbing 3-5 steps with a railing? : A Little 6 Click Score: 16    End of Session Equipment Utilized During Treatment: Gait belt Activity Tolerance: Patient tolerated treatment well Patient left: with nursing/sitter in room;with family/visitor present;Other (comment) (transitioned pt care  to OT with pt on toilet in bathroom with NT assist.) Nurse Communication: Mobility status PT Visit Diagnosis: Unsteadiness on feet (R26.81);Other abnormalities of gait and mobility (R26.89);Muscle weakness (generalized) (M62.81)    Time: 6701-4103 PT Time Calculation (min) (ACUTE ONLY): 44 min   Charges:   PT Evaluation $PT Eval Moderate Complexity: 1 Mod PT Treatments $Gait Training: 23-37 mins $Therapeutic Activity: 8-22 mins        Tarvares Lant M. Fairly IV, PT, DPT Physical Therapist- [place]  [facility]  05/06/2021, 10:34 AM

## 2021-05-06 NOTE — TOC Progression Note (Signed)
Transition of Care [facility]) - Progression Note    Patient Details  Name: Aliza Moret MRN: [id number] Date of Birth: 08/23/1951  Transition of Care [facility]) CM/SW Contact  Caryn Section, RN Phone Number: 05/06/2021, 1:17 PM  Clinical Narrative:   Patient was OOB to chair, alert to self when RNCM was at bedside.  She stated she was watching everyone at church.  When asked where she was, she stated "where people are taking care of me."  RNCM left message for patient's daughter to call re: disposition-awaiting resposne.  Recommendation is for SNF placement.  TOC contact information given to daughter, TOC will follow through discharge.         Expected Discharge Plan and Services                                                 Social Determinants of Health (SDOH) Interventions    Readmission Risk Interventions No flowsheet data found.

## 2021-05-07 ENCOUNTER — Inpatient Hospital Stay
Admit: 2021-05-07 | Discharge: 2021-05-07 | Disposition: A | Discharge status: Home / Self Care | Payer: Medicare Other | Attending: Internal Medicine | Admitting: Internal Medicine

## 2021-05-07 LAB — BLOOD CULTURE ID PANEL (REFLEXED) - BCID2

## 2021-05-07 LAB — ECHOCARDIOGRAM COMPLETE
AR max vel: 2.66 cm2
AV Area VTI: 2.64 cm2
AV Area mean vel: 2.41 cm2
AV Mean grad: 4 mmHg
AV Peak grad: 6.1 mmHg
Ao pk vel: 1.23 m/s
Area-P 1/2: 6.65 cm2
Height: 69 in
MV VTI: 2.85 cm2
S' Lateral: 2.16 cm
Weight: 3435.2 oz

## 2021-05-07 LAB — BASIC METABOLIC PANEL
Anion gap: 7 (ref 5–15)
BUN: 40 mg/dL — ABNORMAL HIGH (ref 8–23)
CO2: 25 mmol/L (ref 22–32)
Calcium: 9.2 mg/dL (ref 8.9–10.3)
Chloride: 103 mmol/L (ref 98–111)
Creatinine, Ser: 1.54 mg/dL — ABNORMAL HIGH (ref 0.44–1.00)
GFR, Estimated: 36 mL/min — ABNORMAL LOW (ref >60)
Glucose, Bld: 149 mg/dL — ABNORMAL HIGH (ref 70–99)
Potassium: 4.1 mmol/L (ref 3.5–5.1)
Sodium: 135 mmol/L (ref 135–145)

## 2021-05-07 LAB — RESP PANEL BY RT-PCR (FLU A&B, COVID) ARPGX2
Influenza A by PCR: NEGATIVE
Influenza B by PCR: NEGATIVE
SARS Coronavirus 2 by RT PCR: NEGATIVE

## 2021-05-07 LAB — GLUCOSE, CAPILLARY
Glucose-Capillary: 133 mg/dL — ABNORMAL HIGH (ref 70–99)
Glucose-Capillary: 166 mg/dL — ABNORMAL HIGH (ref 70–99)
Glucose-Capillary: 231 mg/dL — ABNORMAL HIGH (ref 70–99)
Glucose-Capillary: 159 mg/dL — ABNORMAL HIGH (ref 70–99)
Glucose-Capillary: 185 mg/dL — ABNORMAL HIGH (ref 70–99)

## 2021-05-07 LAB — CBC
HCT: 31.4 % — ABNORMAL LOW (ref 36.0–46.0)
Hemoglobin: 10.5 g/dL — ABNORMAL LOW (ref 12.0–15.0)
MCH: 30.9 pg (ref 26.0–34.0)
MCHC: 33.4 g/dL (ref 30.0–36.0)
MCV: 92.4 fL (ref 80.0–100.0)
Platelets: 268 10*3/uL (ref 150–400)
RBC: 3.4 MIL/uL — ABNORMAL LOW (ref 3.87–5.11)
RDW: 13.4 % (ref 11.5–15.5)
WBC: 11.1 10*3/uL — ABNORMAL HIGH (ref 4.0–10.5)
nRBC: 0 % (ref 0.0–0.2)

## 2021-05-07 LAB — URINE CULTURE
Culture: 60000 — AB
Special Requests: NORMAL

## 2021-05-07 MED ORDER — RIVAROXABAN 20 MG PO TABS
20.0000 mg | ORAL_TABLET | Freq: Every day | ORAL | Status: DC
  Administered 2021-05-07: 17:00:00 20 mg via ORAL
  Filled 2021-05-07 (×2): qty 1

## 2021-05-07 NOTE — Progress Notes (Signed)
PHARMACY - PHYSICIAN COMMUNICATION CRITICAL VALUE ALERT - BLOOD CULTURE IDENTIFICATION (BCID)   Patient with suspected aspiration pneumonia due to recent history of vomiting on day 2 of antibiotics, on Augmentin with 5 day duration.   BCID resulted negative, though gram stain grew gram positive cocci in 1 of 4 bottles.    Name of physician (or Provider) Contacted: Dr. Nelson Chimes  Changes to prescribed antibiotics required: None. Likely contaminant. Recommendation accepted by MD.    Jaynie Bream, PharmD Pharmacy Resident  05/07/2021 12:29 PM

## 2021-05-07 NOTE — NC FL2 (Signed)
[place] MEDICAID FL2 LEVEL OF CARE SCREENING TOOL     IDENTIFICATION  Patient Name: Leah Ritter Birthdate: 10/08/1950 Sex: female Admission Date (Current Location): 05/05/2021  County and Medicaid Number:  [place]   Facility and Address:  [facility], [address], [place], [place] [address]      Provider Number: [id number]  Attending Physician Name and Address:  Amin, Sumayya, MD  Relative Name and Phone Number:  Woods, Suszette (Daughter)   [phone] (Mobile)    Current Level of Care: Hospital Recommended Level of Care: Assisted Living Facility, Memory Care Prior Approval Number:    Date Approved/Denied:   PASRR Number: [id number]  Discharge Plan: Other (Comment) ([facility], [place], [place] [address]Phone: [phone])    Current Diagnoses: Patient Active Problem List   Diagnosis Date Noted   AKI (acute kidney injury) (HCC)    Hyperglycemia    AMS (altered mental status) 05/05/2021   SIRS (systemic inflammatory response syndrome) (HCC) 01/09/2019   Acute on chronic systolic CHF (congestive heart failure) (HCC) 01/09/2019   Pain in joint involving ankle and foot 01/03/2018   Localized, primary osteoarthritis of shoulder region 01/03/2018   Rupture of tendon of biceps, long head 01/03/2018   Disorder of bursae of shoulder region 01/03/2018   Screening declined by patient 10/25/2017   Full thickness rotator cuff tear 04/21/2016   Chronic bilateral low back pain without sciatica 02/10/2016   H/O rotator cuff tear 02/10/2016   History of vitamin D deficiency 02/10/2016   Type 2 diabetes mellitus without complication, with long-term current use of insulin (HCC) 02/10/2016   Diabetes (HCC) 11/24/2006   HLD (hyperlipidemia) 11/24/2006   OBESITY, NOS 11/24/2006   POST TRAUMATIC STRESS DISORDER 11/24/2006   DEPRESSIVE DISORDER, NOS 11/24/2006   HYPERTENSION, BENIGN SYSTEMIC 11/24/2006   RHINITIS, ALLERGIC 11/24/2006   GERD  (gastroesophageal reflux disease) 11/24/2006   OSTEOARTHRITIS, MULTI SITES 11/24/2006   APNEA, SLEEP 11/24/2006    Orientation RESPIRATION BLADDER Height & Weight     Self, Situation  Normal (99% on room air) Incontinent, External catheter Weight: 97.4 kg Height:  5' 9" (175.3 cm)  BEHAVIORAL SYMPTOMS/MOOD NEUROLOGICAL BOWEL NUTRITION STATUS      Incontinent Diet (mechanical soft)  AMBULATORY STATUS COMMUNICATION OF NEEDS Skin     Verbally Normal, Other (Comment) (Dry Flaky rash, bilateral legs)                       Personal Care Assistance Level of Assistance  Bathing, Feeding, Dressing           Functional Limitations Info  Sight, Hearing, Speech Sight Info: Adequate Hearing Info: Adequate Speech Info: Adequate (Dentures)    SPECIAL CARE FACTORS FREQUENCY   (Memory care)                    Contractures Contractures Info: Not present    Additional Factors Info  Code Status, Allergies Code Status Info: FULL CODE Allergies Info: Oxycodone-acetaminophen,  Ezetimibe, Lisinopril, Statins           Current Medications (05/07/2021):  This is the current hospital active medication list Current Facility-Administered Medications  Medication Dose Route Frequency Provider Last Rate Last Admin   amoxicillin-clavulanate (AUGMENTIN) 875-125 MG per tablet 1 tablet  1 tablet Oral Q12H Amin, Sumayya, MD   1 tablet at 05/07/21 0940   haloperidol lactate (HALDOL) injection 1 mg  1 mg Intramuscular Q6H PRN Mansy, Jan A, MD         insulin aspart (novoLOG) injection 0-15 Units  0-15 Units Subcutaneous TID WC Arnetha Courser, MD   3 Units at 05/07/21 1658   insulin aspart (novoLOG) injection 0-5 Units  0-5 Units Subcutaneous QHS Arnetha Courser, MD       insulin aspart (novoLOG) injection 3 Units  3 Units Subcutaneous TID WC Arnetha Courser, MD   3 Units at 05/07/21 1658   metoprolol succinate (TOPROL-XL) 24 hr tablet 50 mg  50 mg Oral Daily Arnetha Courser, MD   50 mg at 05/07/21  0941   mirtazapine (REMERON SOL-TAB) disintegrating tablet 15 mg  15 mg Oral QHS Arnetha Courser, MD   15 mg at 05/06/21 2022   QUEtiapine (SEROQUEL) tablet 25 mg  25 mg Oral Daily Arnetha Courser, MD   25 mg at 05/07/21 0940   rivaroxaban (XARELTO) tablet 20 mg  20 mg Oral Q supper Jaynie Bream, RPH   20 mg at 05/07/21 1657   traZODone (DESYREL) tablet 25 mg  25 mg Oral QHS PRN Mansy, Vernetta Honey, MD         Discharge Medications: Please see discharge summary for a list of discharge medications.  Relevant Imaging Results:  Relevant Lab Results:   Additional Information SSN [id number]  Caryn Section, RN

## 2021-05-07 NOTE — [place] FL2 (Signed)
[place] MEDICAID FL2 LEVEL OF CARE SCREENING TOOL     IDENTIFICATION  Patient Name: Leah Ritter Birthdate: [birth year] Sex: female Admission Date (Current Location): 05/05/2021  [facility] and [place] Number:  Chiropodist and Address:  [facility], [address] [place], [place] [address]      Provider Number: [id number]  Attending Physician Name and Address:  Arnetha Courser, MD  Relative Name and Phone Number:  Fredric Mare (Daughter)   [phone] [facility])    Current Level of Care: Hospital Recommended Level of Care: Assisted Living Facility, Memory Care Prior Approval Number:    Date Approved/Denied:   PASRR Number: [id number]  Discharge Plan: Other (Comment) [facility] [phone] [place], [place] [address]CWCBJ: [phone])    Current Diagnoses: Patient Active Problem List   Diagnosis Date Noted   AKI (acute kidney injury) (HCC)    Hyperglycemia    AMS (altered mental status) 05/05/2021   SIRS (systemic inflammatory response syndrome) (HCC) 01/09/2019   Acute on chronic systolic CHF (congestive heart failure) (HCC) 01/09/2019   Pain in joint involving ankle and foot 01/03/2018   Localized, primary osteoarthritis of shoulder region 01/03/2018   Rupture of tendon of biceps, long head 01/03/2018   Disorder of bursae of shoulder region 01/03/2018   Screening declined by patient 10/25/2017   Full thickness rotator cuff tear 04/21/2016   Chronic bilateral low back pain without sciatica 02/10/2016   H/O rotator cuff tear 02/10/2016   History of vitamin D deficiency 02/10/2016   Type 2 diabetes mellitus without complication, with long-term current use of insulin (HCC) 02/10/2016   Diabetes (HCC) 11/24/2006   HLD (hyperlipidemia) 11/24/2006   OBESITY, NOS 11/24/2006   POST TRAUMATIC STRESS DISORDER 11/24/2006   DEPRESSIVE DISORDER, NOS 11/24/2006   HYPERTENSION, BENIGN SYSTEMIC 11/24/2006   RHINITIS, ALLERGIC 11/24/2006   GERD  (gastroesophageal reflux disease) 11/24/2006   OSTEOARTHRITIS, MULTI SITES 11/24/2006   APNEA, SLEEP 11/24/2006    Orientation RESPIRATION BLADDER Height & Weight     Self, Situation  Normal (99% on room air) Incontinent, External catheter Weight: 97.4 kg Height:  5\' 9"  (175.3 cm)  BEHAVIORAL SYMPTOMS/MOOD NEUROLOGICAL BOWEL NUTRITION STATUS      Incontinent Diet (Dysphagia 3)  AMBULATORY STATUS COMMUNICATION OF NEEDS Skin     Verbally Normal, Other (Comment) (Dry Flaky rash, bilateral legs)                       Personal Care Assistance Level of Assistance  Bathing, Feeding, Dressing           Functional Limitations Info  Sight, Hearing, Speech Sight Info: Adequate Hearing Info: Adequate Speech Info: Adequate (Dentures)    SPECIAL CARE FACTORS FREQUENCY   (Memory care)                    Contractures Contractures Info: Not present    Additional Factors Info  Code Status, Allergies Code Status Info: FULL CODE Allergies Info: Oxycodone-acetaminophen,  Ezetimibe, Lisinopril, Statins           Current Medications (05/07/2021):  This is the current hospital active medication list Current Facility-Administered Medications  Medication Dose Route Frequency Provider Last Rate Last Admin   amoxicillin-clavulanate (AUGMENTIN) 875-125 MG per tablet 1 tablet  1 tablet Oral Q12H 07/07/2021, MD   1 tablet at 05/07/21 0940   haloperidol lactate (HALDOL) injection 1 mg  1 mg Intramuscular Q6H PRN Mansy, 07/07/21, MD  insulin aspart (novoLOG) injection 0-15 Units  0-15 Units Subcutaneous TID WC Arnetha Courser, MD   5 Units at 05/07/21 1157   insulin aspart (novoLOG) injection 0-5 Units  0-5 Units Subcutaneous QHS Arnetha Courser, MD       insulin aspart (novoLOG) injection 3 Units  3 Units Subcutaneous TID WC Arnetha Courser, MD   3 Units at 05/07/21 1156   metoprolol succinate (TOPROL-XL) 24 hr tablet 50 mg  50 mg Oral Daily Arnetha Courser, MD   50 mg at 05/07/21 0941    mirtazapine (REMERON SOL-TAB) disintegrating tablet 15 mg  15 mg Oral QHS Arnetha Courser, MD   15 mg at 05/06/21 2022   QUEtiapine (SEROQUEL) tablet 25 mg  25 mg Oral Daily Arnetha Courser, MD   25 mg at 05/07/21 0940   rivaroxaban (XARELTO) tablet 20 mg  20 mg Oral Q supper Jaynie Bream, RPH       traZODone (DESYREL) tablet 25 mg  25 mg Oral QHS PRN Mansy, Vernetta Honey, MD         Discharge Medications: Please see discharge summary for a list of discharge medications.  Relevant Imaging Results:  Relevant Lab Results:   Additional Information SSN [id number]  Caryn Section, RN

## 2021-05-07 NOTE — [place] FL2 (Signed)
[place] MEDICAID FL2 LEVEL OF CARE SCREENING TOOL     IDENTIFICATION  Patient Name: Leah Ritter Birthdate: 26-May-1951 Sex: female Admission Date (Current Location): 05/05/2021  [facility] and [place] Number:  Chiropodist and Address:  [facility], [address] [place], [place] [address]      Provider Number: [id number]  Attending Physician Name and Address:  Arnetha Courser, MD  Relative Name and Phone Number:  Fredric Mare (Daughter)   [phone] [facility])    Current Level of Care: Hospital Recommended Level of Care: Assisted Living Facility, Memory Care Prior Approval Number:    Date Approved/Denied:   PASRR Number: [id number]  Discharge Plan: Other (Comment) [facility] [phone] [place], [place] [address]ZCHYI: [phone])    Current Diagnoses: Patient Active Problem List   Diagnosis Date Noted   AKI (acute kidney injury) (HCC)    Hyperglycemia    AMS (altered mental status) 05/05/2021   SIRS (systemic inflammatory response syndrome) (HCC) 01/09/2019   Acute on chronic systolic CHF (congestive heart failure) (HCC) 01/09/2019   Pain in joint involving ankle and foot 01/03/2018   Localized, primary osteoarthritis of shoulder region 01/03/2018   Rupture of tendon of biceps, long head 01/03/2018   Disorder of bursae of shoulder region 01/03/2018   Screening declined by patient 10/25/2017   Full thickness rotator cuff tear 04/21/2016   Chronic bilateral low back pain without sciatica 02/10/2016   H/O rotator cuff tear 02/10/2016   History of vitamin D deficiency 02/10/2016   Type 2 diabetes mellitus without complication, with long-term current use of insulin (HCC) 02/10/2016   Diabetes (HCC) 11/24/2006   HLD (hyperlipidemia) 11/24/2006   OBESITY, NOS 11/24/2006   POST TRAUMATIC STRESS DISORDER 11/24/2006   DEPRESSIVE DISORDER, NOS 11/24/2006   HYPERTENSION, BENIGN SYSTEMIC 11/24/2006   RHINITIS, ALLERGIC 11/24/2006   GERD  (gastroesophageal reflux disease) 11/24/2006   OSTEOARTHRITIS, MULTI SITES 11/24/2006   APNEA, SLEEP 11/24/2006    Orientation RESPIRATION BLADDER Height & Weight     Self, Situation  Normal (99% on room air) Incontinent, External catheter Weight: 97.4 kg Height:  5\' 9"  (175.3 cm)  BEHAVIORAL SYMPTOMS/MOOD NEUROLOGICAL BOWEL NUTRITION STATUS      Incontinent Diet (mechanical soft)  AMBULATORY STATUS COMMUNICATION OF NEEDS Skin     Verbally Normal, Other (Comment) (Dry Flaky rash, bilateral legs)                       Personal Care Assistance Level of Assistance  Bathing, Feeding, Dressing           Functional Limitations Info  Sight, Hearing, Speech Sight Info: Adequate Hearing Info: Adequate Speech Info: Adequate (Dentures)    SPECIAL CARE FACTORS FREQUENCY   (Memory care)                    Contractures Contractures Info: Not present    Additional Factors Info  Code Status, Allergies Code Status Info: FULL CODE Allergies Info: Oxycodone-acetaminophen,  Ezetimibe, Lisinopril, Statins           Current Medications (05/07/2021):  This is the current hospital active medication list Current Facility-Administered Medications  Medication Dose Route Frequency Provider Last Rate Last Admin   amoxicillin-clavulanate (AUGMENTIN) 875-125 MG per tablet 1 tablet  1 tablet Oral Q12H 07/07/2021, MD   1 tablet at 05/07/21 0940   haloperidol lactate (HALDOL) injection 1 mg  1 mg Intramuscular Q6H PRN Mansy, 07/07/21, MD  insulin aspart (novoLOG) injection 0-15 Units  0-15 Units Subcutaneous TID WC Arnetha Courser, MD   3 Units at 05/07/21 1658   insulin aspart (novoLOG) injection 0-5 Units  0-5 Units Subcutaneous QHS Arnetha Courser, MD       insulin aspart (novoLOG) injection 3 Units  3 Units Subcutaneous TID WC Arnetha Courser, MD   3 Units at 05/07/21 1658   metoprolol succinate (TOPROL-XL) 24 hr tablet 50 mg  50 mg Oral Daily Arnetha Courser, MD   50 mg at 05/07/21  0941   mirtazapine (REMERON SOL-TAB) disintegrating tablet 15 mg  15 mg Oral QHS Arnetha Courser, MD   15 mg at 05/06/21 2022   QUEtiapine (SEROQUEL) tablet 25 mg  25 mg Oral Daily Arnetha Courser, MD   25 mg at 05/07/21 0940   rivaroxaban (XARELTO) tablet 20 mg  20 mg Oral Q supper Jaynie Bream, RPH   20 mg at 05/07/21 1657   traZODone (DESYREL) tablet 25 mg  25 mg Oral QHS PRN Mansy, Vernetta Honey, MD         Discharge Medications: Please see discharge summary for a list of discharge medications.  Relevant Imaging Results:  Relevant Lab Results:   Additional Information SSN [id number]  Caryn Section, RN

## 2021-05-07 NOTE — Progress Notes (Signed)
PROGRESS NOTE    Leah Ritter  NGE:[id number] DOB: 16-May-1951 DOA: 05/05/2021 PCP: Mortimer Fries, PA   Brief Narrative: Taken from H&P. Leah Ritter is a 70 y.o. female with medical history significant for dementia, prior CVA in 2019, chronic systolic CHF, essential hypertension, type 2 diabetes, who presented to [facility] ED from SNF due to altered mental status and lethargy x 5 days.  Apparently had a projectile vomiting on Friday. Apparently daughter found her outside in the sun, sweaty under her clothes and minimally responsive.  EMS was activated.  In ED CT head was without any acute abnormality.  Some electrolyte disturbances which include hyperkalemia and elevated creatinine, most likely due to prerenal AKI.  She received IV fluid in ED.  COVID-19 was negative. Chest x-ray with bilateral infiltrate/vascular congestion and could not definitely ruled out pneumonia.  Procalcitonin at 0.20.  She was started on Unasyn for concern of aspiration pneumonia due to her history of projectile vomiting on Friday.  Daughter wants her to return to [facility] assisted living which is a memory care unit and per daughter patient was getting 24/7 care over there.  She was concerned about her wandering habits and does not want her to go to a regular rehab.  Someone with that facility will be coming to evaluate her and if appropriate can be discharged tomorrow.  Subjective: Patient was seen and examined today.  Having some hallucinations and remained oriented to self which is her baseline.  Daughter at bedside.  No other acute concerns.  She wants her mom to remain in her family or involvement of [place] assisted living where she was living in a memory care unit.  Assessment & Plan:   Active Problems:   AMS (altered mental status)   AKI (acute kidney injury) (HCC)   Hyperglycemia  Acute metabolic encephalopathy.  Might be due to dehydration and AKI.  Now at baseline. Blood cultures negative, urine cultures pending  but UA was not very impressive. -Follow-up urine culture. -Continue to monitor  Suspected aspiration pneumonia, POA.  Chest x-ray with bilateral pulmonary infiltrate/vascular congestion.  Cannot definitely rule out pneumonia/pneumonitis from possible aspiration due to 1 episode of projectile vomiting.  Procalcitonin at 0.20.  She was started on Unasyn and later switched to Augmentin. Swallow evaluation with mild oral phase dysphagia secondary to dementia. -Continue Augmentin and will complete 5-day course.  AKI.  Most likely secondary to dehydration.  Creatinine continue to improve.  2 years ago creatinine was less than 1.  Patient lost IV line.  Received some IV fluid. -Encourage p.o. hydration -Monitor renal function -Keep holding losartan  Hyperkalemia.  Resolved.  It was 6.1 without any EKG changes on admission.  Received IV insulin and D50.  Apparently losartan and potassium was listed on home meds, she also received a dose of Lokelma -Stop Lokelma " -Monitor electrolytes -Keep holding losartan  Elevated troponin.  Barely positive troponin, most likely secondary to demand ischemia.  No chest pain.  BNP within normal limit. Echocardiogram done-pending results.  Type 2 diabetes mellitus with hyperglycemia.  CBG seems improving.  A1c elevated at 10.5.  Glipizide was listed on home meds, not sure whether she was taking it or not. -Moderate SSI -3 units of NovoLog with meals  Chronic atrial fibrillation.  Rate in upper 90s.  Home dose of metoprolol was held due to softer blood pressure on presentation.  Blood pressure improved. -Continue Toprol at 50 mg/day. -Continue with Xarelto  Chronic normocytic anemia Hemoglobin 10.5, no record of  CBC except more than 2 years ago. No overt bleeding. -Monitor hemoglobin  History of prior CVA right frontal encephalomalacia/vascular dementia Reorient as needed Aspiration/fall/delirium precautions PT OT assessment-recommending SNF.  Daughter  does not want her to go to rehab due to her wandering behavior and patient will return to her memory care unit and will get rehab over there.  Objective: Vitals:   05/06/21 2038 05/07/21 0515 05/07/21 0749 05/07/21 1147  BP: (!) 122/55 122/71 118/81 123/86  Pulse: 91 82 82 (!) 110  Resp: 18 18 18 18   Temp: 98.4 F (36.9 C) 97.9 F (36.6 C) 98 F (36.7 C) 97.8 F (36.6 C)  TempSrc: Oral Oral    SpO2: 99% 97% 100%   Weight:      Height:        Intake/Output Summary (Last 24 hours) at 05/07/2021 1628 Last data filed at 05/07/2021 0920 Gross per 24 hour  Intake 240 ml  Output --  Net 240 ml    Filed Weights   05/05/21 1748  Weight: 97.4 kg    Examination:  General.  Well-developed elderly lady, in no acute distress. Pulmonary.  Lungs clear bilaterally, normal respiratory effort. CV.  Regular rate and rhythm, no JVD, rub or murmur. Abdomen.  Soft, nontender, nondistended, BS positive. CNS.  Alert and oriented to self only.  No focal neurologic deficit. Extremities.  No edema, no cyanosis, pulses intact and symmetrical. Psychiatry.  Judgment and insight appears impaired.   DVT prophylaxis: Xarelto Code Status: Full Family Communication: Discussed with daughter at bedside Disposition Plan:  Status is: Inpatient  Remains inpatient appropriate because:Inpatient level of care appropriate due to severity of illness  Dispo:  Patient From: Skilled Nursing Facility  Planned Disposition: Skilled Nursing Facility  Medically stable for discharge: No   Level of care: Med-Surg  All the records are reviewed and case discussed with Care Management/Social Worker. Management plans discussed with the patient, nursing and they are in agreement.  Consultants:  None  Procedures:  Antimicrobials:  Augmentin  Data Reviewed: I have personally reviewed following labs and imaging studies  CBC: Recent Labs  Lab 05/05/21 1754 05/07/21 0506  WBC 10.5 11.1*  NEUTROABS 8.4*  --    HGB 9.5* 10.5*  HCT 29.2* 31.4*  MCV 92.1 92.4  PLT 244 268    Basic Metabolic Panel: Recent Labs  Lab 05/05/21 1754 05/06/21 0003 05/06/21 0333 05/07/21 0506  NA 132* 134* 140 135  K 6.1* 4.7 5.3* 4.1  CL 98 100 102 103  CO2 23 24 27 25   GLUCOSE 458* 426* 107* 149*  BUN 58* 55* 51* 40*  CREATININE 2.62* 2.35* 1.96* 1.54*  CALCIUM 8.8* 8.9 9.6 9.2  PHOS  --   --  3.1  --     GFR: Estimated Creatinine Clearance: 41.6 mL/min (A) (by C-G formula based on SCr of 1.54 mg/dL (H)). Liver Function Tests: Recent Labs  Lab 05/05/21 1754 05/06/21 0333  AST 33  --   ALT 18  --   ALKPHOS 85  --   BILITOT 1.0  --   PROT 7.5  --   ALBUMIN 3.5 3.4*    No results for input(s): LIPASE, AMYLASE in the last 168 hours. Recent Labs  Lab 05/06/21 0003  AMMONIA 10    Coagulation Profile: No results for input(s): INR, PROTIME in the last 168 hours. Cardiac Enzymes: No results for input(s): CKTOTAL, CKMB, CKMBINDEX, TROPONINI in the last 168 hours. BNP (last 3 results) No results for  input(s): PROBNP in the last 8760 hours. HbA1C: Recent Labs    05/06/21 0003  HGBA1C 10.5*    CBG: Recent Labs  Lab 05/06/21 2031 05/07/21 0514 05/07/21 0748 05/07/21 1146 05/07/21 1544  GLUCAP 197* 133* 166* 231* 159*    Lipid Profile: No results for input(s): CHOL, HDL, LDLCALC, TRIG, CHOLHDL, LDLDIRECT in the last 72 hours. Thyroid Function Tests: No results for input(s): TSH, T4TOTAL, FREET4, T3FREE, THYROIDAB in the last 72 hours. Anemia Panel: No results for input(s): VITAMINB12, FOLATE, FERRITIN, TIBC, IRON, RETICCTPCT in the last 72 hours. Sepsis Labs: Recent Labs  Lab 05/06/21 0003 05/06/21 0333  PROCALCITON 0.20  --   LATICACIDVEN 2.0* 1.4     Recent Results (from the past 240 hour(s))  Urine Culture     Status: Abnormal   Collection Time: 05/05/21  5:54 PM   Specimen: Urine, Clean Catch  Result Value Ref Range Status   Specimen Description   Final    URINE,  CLEAN CATCH Performed at [facility], [address], [place], [place] [address]    Special Requests   Final    Normal Performed at [facility], [address] [place], [place] [address]    Culture (A)  Final    60,000 COLONIES/mL LACTOBACILLUS SPECIES Standardized susceptibility testing for this organism is not available. Performed at [facility], [address], [place], [place] [address]    Report Status 05/07/2021 FINAL  Final  Resp Panel by RT-PCR (Flu A&B, Covid) Nasopharyngeal Swab     Status: None   Collection Time: 05/05/21  7:09 PM   Specimen: Nasopharyngeal Swab; Nasopharyngeal(NP) swabs in vial transport medium  Result Value Ref Range Status   SARS Coronavirus 2 by RT PCR NEGATIVE NEGATIVE Final    Comment: (NOTE) SARS-CoV-2 target nucleic acids are NOT DETECTED.  The SARS-CoV-2 RNA is generally detectable in upper respiratory specimens during the acute phase of infection. The lowest concentration of SARS-CoV-2 viral copies this assay can detect is 138 copies/mL. A negative result does not preclude SARS-Cov-2 infection and should not be used as the sole basis for treatment or other patient management decisions. A negative result may occur with  improper specimen collection/handling, submission of specimen other than nasopharyngeal swab, presence of viral mutation(s) within the areas targeted by this assay, and inadequate number of viral copies(<138 copies/mL). A negative result must be combined with clinical observations, patient history, and epidemiological information. The expected result is Negative.  Fact Sheet for Patients:  BloggerCourse.com  Fact Sheet for Healthcare Providers:  SeriousBroker.it  This test is no t yet approved or cleared by the [place] FDA and  has been authorized for detection and/or diagnosis of SARS-CoV-2 by FDA under an Emergency Use Authorization  (EUA). This EUA will remain  in effect (meaning this test can be used) for the duration of the COVID-19 declaration under Section 564(b)(1) of the Act, 21 U.S.C.section 360bbb-3(b)(1), unless the authorization is terminated  or revoked sooner.       Influenza A by PCR NEGATIVE NEGATIVE Final   Influenza B by PCR NEGATIVE NEGATIVE Final    Comment: (NOTE) The Xpert Xpress SARS-CoV-2/FLU/RSV plus assay is intended as an aid in the diagnosis of influenza from Nasopharyngeal swab specimens and should not be used as a sole basis for treatment. Nasal washings and aspirates are unacceptable for Xpert Xpress SARS-CoV-2/FLU/RSV testing.  Fact Sheet for Patients: BloggerCourse.com  Fact Sheet for Healthcare Providers: SeriousBroker.it  This test is not  yet approved or cleared by the [place] FDA and has been authorized for detection and/or diagnosis of SARS-CoV-2 by FDA under an Emergency Use Authorization (EUA). This EUA will remain in effect (meaning this test can be used) for the duration of the COVID-19 declaration under Section 564(b)(1) of the Act, 21 U.S.C. section 360bbb-3(b)(1), unless the authorization is terminated or revoked.  Performed at [facility], [address] [place], [place] [address]   CULTURE, BLOOD (ROUTINE X 2) w Reflex to ID Panel     Status: None (Preliminary result)   Collection Time: 05/06/21 12:03 AM   Specimen: BLOOD  Result Value Ref Range Status   Specimen Description [facility]OD RIGHT [facility]  Final   Special Requests   Final    BOTTLES DRAWN AEROBIC AND ANAEROBIC Blood Culture results may not be optimal due to an inadequate volume of blood received in culture bottles   Culture   Final    NO GROWTH 1 DAY Performed at [facility], [address] [place], [place] [address]    Report Status PENDING  Incomplete  CULTURE, BLOOD (ROUTINE X 2) w Reflex to ID Panel     Status: None  (Preliminary result)   Collection Time: 05/06/21 12:18 AM   Specimen: BLOOD  Result Value Ref Range Status   Specimen Description BLOOD RIGHT FA  Final   Special Requests   Final    BOTTLES DRAWN AEROBIC ONLY Blood Culture results may not be optimal due to an inadequate volume of blood received in culture bottles   Culture  Setup Time   Final    Organism ID to follow GRAM POSITIVE COCCI AEROBIC BOTTLE ONLY Gram Stain Report Called to,Read Back By and Verified With: SUSAN WATSON 05/07/21@ 1130 BY SB Performed at [facility], [address] [place], [place] [address]    Culture GRAM POSITIVE COCCI  Final   Report Status PENDING  Incomplete  Blood Culture ID Panel (Reflexed)     Status: None   Collection Time: 05/06/21 12:18 AM  Result Value Ref Range Status   Enterococcus faecalis NOT DETECTED NOT DETECTED Final   Enterococcus Faecium NOT DETECTED NOT DETECTED Final   Listeria monocytogenes NOT DETECTED NOT DETECTED Final   Staphylococcus species NOT DETECTED NOT DETECTED Final   Staphylococcus aureus (BCID) NOT DETECTED NOT DETECTED Final   Staphylococcus epidermidis NOT DETECTED NOT DETECTED Final   Staphylococcus lugdunensis NOT DETECTED NOT DETECTED Final   Streptococcus species NOT DETECTED NOT DETECTED Final   Streptococcus agalactiae NOT DETECTED NOT DETECTED Final   Streptococcus pneumoniae NOT DETECTED NOT DETECTED Final   Streptococcus pyogenes NOT DETECTED NOT DETECTED Final   A.calcoaceticus-baumannii NOT DETECTED NOT DETECTED Final   Bacteroides fragilis NOT DETECTED NOT DETECTED Final   Enterobacterales NOT DETECTED NOT DETECTED Final   Enterobacter cloacae complex NOT DETECTED NOT DETECTED Final   Escherichia coli NOT DETECTED NOT DETECTED Final   Klebsiella aerogenes NOT DETECTED NOT DETECTED Final   Klebsiella oxytoca NOT DETECTED NOT DETECTED Final   Klebsiella pneumoniae NOT DETECTED NOT DETECTED Final   Proteus species NOT DETECTED NOT DETECTED Final    Salmonella species NOT DETECTED NOT DETECTED Final   Serratia marcescens NOT DETECTED NOT DETECTED Final   Haemophilus influenzae NOT DETECTED NOT DETECTED Final   Neisseria meningitidis NOT DETECTED NOT DETECTED Final   Pseudomonas aeruginosa NOT DETECTED NOT DETECTED Final   Stenotrophomonas maltophilia NOT DETECTED NOT DETECTED Final   Candida albicans NOT DETECTED NOT DETECTED  Final   Candida auris NOT DETECTED NOT DETECTED Final   Candida glabrata NOT DETECTED NOT DETECTED Final   Candida krusei NOT DETECTED NOT DETECTED Final   Candida parapsilosis NOT DETECTED NOT DETECTED Final   Candida tropicalis NOT DETECTED NOT DETECTED Final   Cryptococcus neoformans/gattii NOT DETECTED NOT DETECTED Final    Comment: Performed at [facility], [address], [place], [place] [address]      Radiology Studies: CT HEAD WO CONTRAST ( )  Result Date: 05/05/2021 CLINICAL DATA:  Mental status change, unknown cause altered, nonfocal exam. eval ICH. on xarelto EXAM: CT HEAD WITHOUT CONTRAST TECHNIQUE: Contiguous axial images were obtained from the base of the skull through the vertex without intravenous contrast. COMPARISON:  Head CT 06/17/2019 FINDINGS: Brain: No acute intracranial hemorrhage. Stable degree of atrophy and chronic small vessel ischemia. Right frontal encephalomalacia is unchanged from prior exam. No evidence of acute ischemia. No hydrocephalus. No midline shift or mass effect. No subdural or extra-axial collection. Vascular: Atherosclerosis of skullbase vasculature without hyperdense vessel or abnormal calcification. Skull: No fracture or focal lesion. Sinuses/Orbits: Paranasal sinuses and mastoid air cells are clear. The visualized orbits are unremarkable. Other: None. IMPRESSION: 1. No acute intracranial abnormality. 2. Stable atrophy, chronic small vessel ischemia, and right frontal encephalomalacia. Electronically Signed   By: Narda Rutherford M.D.   On: 05/05/2021 20:08    DG Chest Portable 1 View  Result Date: 05/05/2021 CLINICAL DATA:  altered, eval infiltrate EXAM: PORTABLE CHEST 1 VIEW COMPARISON:  01/09/2019 FINDINGS: Cardiomegaly. Mild vascular congestion. No overt edema. No confluent opacities or effusions. IMPRESSION: Cardiomegaly, vascular congestion. Electronically Signed   By: Charlett Nose M.D.   On: 05/05/2021 19:04    Scheduled Meds:  amoxicillin-clavulanate  1 tablet Oral Q12H   insulin aspart  0-15 Units Subcutaneous TID WC   insulin aspart  0-5 Units Subcutaneous QHS   insulin aspart  3 Units Subcutaneous TID WC   metoprolol succinate  50 mg Oral Daily   mirtazapine  15 mg Oral QHS   QUEtiapine  25 mg Oral Daily   rivaroxaban  20 mg Oral Q supper   Continuous Infusions:   LOS: 2 days   Time spent: 35 minutes. More than 50% of the time was spent in counseling/coordination of care  Arnetha Courser, MD [facility]  If 7PM-7AM, please contact night-coverage [email or web address]  05/07/2021, 4:28 PM   This record has been created using Conservation officer, historic buildings. Errors have been sought and corrected,but may not always be located. Such creation errors do not reflect on the standard of care.

## 2021-05-07 NOTE — Progress Notes (Signed)
PHARMACIST - PHYSICIAN COMMUNICATION  CONCERNING: Xarelto for Atrial Fibrillation   RECOMMENDATION: Xarelto 20 mg daily  Filed Weights   05/05/21 1748  Weight: 97.4 kg (214 lb 11.2 oz)    Body mass index is 31.71 kg/m.  Creatinine Clearance using total body weight for rivaroxaban dosing: 52 mL/min  Patient was in AKI, of which has improved, therefore expect CrCl to remain above cut off for dose adjustment.   Based on [facility] policy patient is candidate to increase back to Xarelto 20 mg daily due to CrCl > 50 mL/min dosing for Afib.   DESCRIPTION: Pharmacy has adjusted rivaroxaban dose per [facility] policy.  Patient is now receiving Xarelto 20 mg daily. Monitor continued improvement in Scr and determination of new baseline renal function.     Jaynie Bream, PharmD Clinical Pharmacist  05/07/2021 1:14 PM

## 2021-05-07 NOTE — TOC Progression Note (Signed)
Transition of Care [facility]) - Progression Note    Patient Details  Name: Loreley Schwall MRN: [id number] Date of Birth: 05/10/1951  [facility] CM/SW Contact  Caryn Section, RN Phone Number: 05/07/2021, 2:45 PM  Clinical Narrative:   RNCM spoke to daughter yesterday, and daughter prefers that patient return to [facility].  After re-evaluation today, team agrees that return to Assisted Living is appropriate.    RNCM contacted [facility].  They will send a coordinator today to evaluate patient for return.  If assessment is amenable, patient will return tomorrow morning if medically cleared to do so.  RNCM relayed information to daughter, TOC contact information communicated.  RNCM will follow to discharge.         Expected Discharge Plan and Services                                                 Social Determinants of Health (SDOH) Interventions    Readmission Risk Interventions No flowsheet data found.

## 2021-05-07 NOTE — Progress Notes (Signed)
*  PRELIMINARY RESULTS* Echocardiogram 2D Echocardiogram has been performed.  Joanette Gula Audelia Knape 05/07/2021, 9:46 AM

## 2021-05-07 NOTE — Progress Notes (Signed)
Occupational Therapy Treatment Patient Details Name: Leah Ritter MRN: [id number] DOB: [birth year] Today's Date: 05/07/2021    History of present illness Arnelle Nale is a 70 y.o. female with medical history significant for dementia, prior CVA in 2019, chronic systolic CHF, essential hypertension, type 2 diabetes, who presented to [facility] ED from ALF due to altered mental status and lethargy x 5 days.   OT comments  Ms Gillham was seen for OT treatment on this date. Upon arrival to room pt standing with daughter doffing gown to begin dressing. Pt requires MIN cues to don dress in standing, don socks/underwear in sitting/standing. Pt doffed socks in standing c single UE support on counter, no LOBs. SUPERVISION toilet t/f and perihygiene, MIN cues hand washing. Pt making good progress toward goals. Pt continues to benefit from skilled OT services to maximize return to PLOF. Will continue to follow POC. Discharge recommendation updated as pt now appears near functional baseline.    Follow Up Recommendations  Supervision/Assistance - 24 hour;Home health OT    Equipment Recommendations  None recommended by OT    Recommendations for Other Services      Precautions / Restrictions Precautions Precautions: Fall Restrictions Weight Bearing Restrictions: No       Mobility Bed Mobility                    Transfers Overall transfer level: Needs assistance Equipment used: None Transfers: Sit to/from Stand Sit to Stand: Supervision              Balance Overall balance assessment: Needs assistance Sitting-balance support: No upper extremity supported;Feet supported Sitting balance-Leahy Scale: Normal     Standing balance support: No upper extremity supported;During functional activity Standing balance-Leahy Scale: Good                             ADL either performed or assessed with clinical judgement   ADL Overall ADL's : Needs assistance/impaired                                        General ADL Comments: MIN cues to don dress in standing, don socks/underwear in sitting/standing. Pt doffed socks in standing c single UE support on counter, no LOBs. SUPERVISION toilet t/f and perihygiene, MIN cues hand washing      Cognition Arousal/Alertness: Awake/alert Behavior During Therapy: Impulsive;Flat affect Overall Cognitive Status: History of cognitive impairments - at baseline                                 General Comments: MIN-MOD cues to sequence ADLs        Exercises Exercises: Other exercises Other Exercises Other Exercises: Family educated re: OT role, DME recs, d/c recs, falls prevention Other Exercises: LBD, toileting, UBD, sit<>stand, sitting/standing balance/tolerance           Pertinent Vitals/ Pain       Pain Assessment: No/denies pain   Frequency  Min 1X/week        Progress Toward Goals  OT Goals(current goals can now be found in the care plan section)  Progress towards OT goals: Progressing toward goals  Acute Rehab OT Goals Patient Stated Goal: to return to PLOF OT Goal Formulation: With family Time For Goal Achievement: 05/20/21 Potential to Achieve Goals:  Fair ADL Goals Pt Will Perform Grooming: with supervision;standing Pt Will Transfer to Toilet: with modified independence;ambulating;regular height toilet Pt Will Perform Toileting - Clothing Manipulation and hygiene: with modified independence;sitting/lateral leans  Plan Discharge plan needs to be updated;Frequency remains appropriate       AM-PAC OT "6 Clicks" Daily Activity     Outcome Measure   Help from another person eating meals?: None Help from another person taking care of personal grooming?: A Little Help from another person toileting, which includes using toliet, bedpan, or urinal?: None Help from another person bathing (including washing, rinsing, drying)?: A Little Help from another person to put on and  taking off regular upper body clothing?: A Little Help from another person to put on and taking off regular lower body clothing?: A Little 6 Click Score: 20    End of Session    OT Visit Diagnosis: Other abnormalities of gait and mobility (R26.89);Muscle weakness (generalized) (M62.81)   Activity Tolerance Patient tolerated treatment well   Patient Left in bed;with call bell/phone within reach;with bed alarm set;with nursing/sitter in room   Nurse Communication          Time: 9937-1696 OT Time Calculation (min): 25 min  Charges: OT General Charges $OT Visit: 1 Visit OT Treatments $Self Care/Home Management : 23-37 mins  Kathie Dike, M.S. OTR/L  05/07/21, 12:33 PM  ascom [phone]

## 2021-05-08 LAB — BASIC METABOLIC PANEL
Anion gap: 9 (ref 5–15)
BUN: 35 mg/dL — ABNORMAL HIGH (ref 8–23)
CO2: 25 mmol/L (ref 22–32)
Calcium: 9.1 mg/dL (ref 8.9–10.3)
Chloride: 99 mmol/L (ref 98–111)
Creatinine, Ser: 1.41 mg/dL — ABNORMAL HIGH (ref 0.44–1.00)
GFR, Estimated: 40 mL/min — ABNORMAL LOW (ref >60)
Glucose, Bld: 208 mg/dL — ABNORMAL HIGH (ref 70–99)
Potassium: 4.1 mmol/L (ref 3.5–5.1)
Sodium: 133 mmol/L — ABNORMAL LOW (ref 135–145)

## 2021-05-08 LAB — GLUCOSE, CAPILLARY
Glucose-Capillary: 188 mg/dL — ABNORMAL HIGH (ref 70–99)
Glucose-Capillary: 201 mg/dL — ABNORMAL HIGH (ref 70–99)
Glucose-Capillary: 166 mg/dL — ABNORMAL HIGH (ref 70–99)

## 2021-05-08 MED ORDER — MIRTAZAPINE 15 MG PO TBDP: 15.0000 mg | ORAL_TABLET | Freq: Every day | ORAL | Status: AC

## 2021-05-08 MED ORDER — FUROSEMIDE 40 MG PO TABS: 40.0000 mg | ORAL_TABLET | Freq: Every day | ORAL | 0 refills | Status: DC | PRN

## 2021-05-08 MED ORDER — AMOXICILLIN-POT CLAVULANATE 875-125 MG PO TABS: 1.0000 | ORAL_TABLET | Freq: Two times a day (BID) | ORAL | 0 refills | Status: DC

## 2021-05-08 MED ORDER — FUROSEMIDE 40 MG PO TABS
40.0000 mg | ORAL_TABLET | Freq: Every day | ORAL | Status: DC | PRN
Start: 2021-05-08 — End: 2021-05-08

## 2021-05-08 MED ORDER — AMOXICILLIN-POT CLAVULANATE 875-125 MG PO TABS: 1.0000 | ORAL_TABLET | Freq: Two times a day (BID) | ORAL | 0 refills | Status: AC

## 2021-05-08 NOTE — TOC Transition Note (Signed)
Transition of Care [facility]) - CM/SW Discharge Note   Patient Details  Name: Leah Ritter MRN: [id number] Date of Birth: 1951-06-11  [facility] CM/SW Contact:  Allayne Butcher, RN Phone Number: 05/08/2021, 12:30 PM   Clinical Narrative:    Patient medically cleared for discharge back to [facility].  RNCM has left [place] at [facility] a message that patient returning today.  TOC team sent copy of FL2 by fax to facility this morning, hard copy will also go with patient in discharge packet.  Daughter, Leah Ritter, will transport patient back to ALF.    [facility] did come out yesterday to evaluate patient yesterday and they are good with her return today per report from patient daughter and previous [facility] team member.    Final next level of care: Assisted Living Barriers to Discharge: Barriers Resolved   Patient Goals and CMS Choice Patient states their goals for this hospitalization and ongoing recovery are:: Patient ready to return to [facility].gov Compare Post Acute Care list provided to:: Patient Choice offered to / list presented to : Patient, Adult Children  Discharge Placement                       Discharge Plan and Services   Discharge Planning Services: CM Consult Post Acute Care Choice: Resumption of Svcs/PTA Provider          DME Arranged: N/A DME Agency: NA       HH Arranged: PT, OT       Representative spoke with at [facility] Agency: to be provided at the facility  Social Determinants of Health (SDOH) Interventions     Readmission Risk Interventions No flowsheet data found.

## 2021-05-08 NOTE — Care Management Important Message (Signed)
Important Message  Patient Details  Name: Leah Ritter MRN: [id number] Date of Birth: Aug 06, 1951   Medicare Important Message Given:  Yes  Reviewed Medicare IM with daughter, Fredric Mare, at [phone].  Copy of Medicare IM left in patient's room for reference.    [name] 05/08/2021, 1:17 PM

## 2021-05-08 NOTE — Progress Notes (Signed)
Late Entry: 2305 pt seen for niv hs. Pt declined to use. Pt made aware to notify RT if she chooses to wear. Pt left in [place]. Will follow.

## 2021-05-08 NOTE — Progress Notes (Signed)
Patient being discharged back to [facility]. Paper work sent with daughter. Patient is going POV with daughter back to [facility].

## 2021-05-08 NOTE — Discharge Summary (Signed)
Physician Discharge Summary  Leah Ritter YBW:[id number] DOB: 05/29/1951 DOA: 05/05/2021  PCP: Mortimer Fries, PA  Admit date: 05/05/2021 Discharge date: 05/08/2021  Admitted From: ALF(memory care unit) Disposition: [place] memory care unit  Recommendations for Outpatient Follow-up:  Follow up with PCP in 1-2 weeks Please obtain BMP/CBC in one week-make sure her renal functions are improving. Please follow up on the following pending results: None  Home Health: No Equipment/Devices: Rolling walker Discharge Condition: Stable CODE STATUS: Full Diet recommendation: Heart Healthy / Carb Modified   Brief/Interim Summary: Leah Ritter is a 70 y.o. female with medical history significant for dementia, prior CVA in 2019, chronic systolic CHF, essential hypertension, type 2 diabetes, who presented to [facility] ED from SNF due to altered mental status and lethargy x 5 days.  Apparently had a projectile vomiting on Friday. Apparently daughter found her outside in the sun, sweaty under her clothes and minimally responsive.  EMS was activated.  In ED CT head was without any acute abnormality.  Some electrolyte disturbances which include hyperkalemia and elevated creatinine, most likely due to prerenal AKI.  She received IV fluid in ED.  COVID-19 was negative. Chest x-ray with bilateral infiltrate/vascular congestion and could not definitely ruled out pneumonia.  Procalcitonin at 0.20.  She was started on Unasyn for concern of aspiration pneumonia due to her history of projectile vomiting on Friday, which was later transitioned to Augmentin and she will complete a total of 5-day course.  She appears to be at her baseline.  Acute metabolic encephalopathy was thought to be due to dehydration and AKI.  Blood cultures remain negative, urine cultures with lactobacillus.  No urinary symptoms.  Patient had AKI on admission with creatinine peaked at 2.62 most likely secondary to dehydration, slowly improving with some IV  fluids and encouraging p.o. hydration.  She will need to be encouraged continuously for p.o. hydration.  Creatinine was 1.41 on discharge with baseline around 1.  Good urinary output.  Patient also had hyperkalemia with potassium of 6.1 on admission, most likely with AKI.  No EKG changes.  She did received IV insulin and D50 in ED.  Patient was on losartan and potassium supplement was listed on her home meds.  Losartan and potassium both was discontinued.  She also received Lokelma and potassium was within normal limit on discharge.  We discontinue potassium and losartan as her blood pressure was well controlled only with metoprolol.  She needs to be seen by her PCP within next week and they can determine the need of restarting those medications.  Patient did had barely positive troponin most likely secondary to demand ischemia.  No chest pain and EKG changesNo chest pain or EKG changes.Echocardiogram without any significant abnormality, normal EF pand grade 1 diastolic dysfunction.  Patient also has an history of chronic atrial fibrillation. Rate well controlled p on metoprolol which he will continue along with Xarelto for anticoagulation.  She will continue rest of her home medications and follow-up with her providers.  Discharge Diagnoses:  Active Problems:   AMS (altered mental status)   AKI (acute kidney injury) (HCC)   Hyperglycemia  Discharge Instructions  Discharge Instructions     Diet - low sodium heart healthy   Complete by: As directed    Discharge instructions   Complete by: As directed    Patient came with AKI and hyperkalemia.  We discontinued potassium supplement and losartan.  Blood pressure currently well controlled with metoprolol only.  She can continue spironolactone if needed. She  will need monitoring of BMP and a close follow-up with PCP for further recommendations. Her diabetes is uncontrolled with A1c above 10, she was on glipizide only.  Please consult her PCP to  add an agent or insulin for better control.   Increase activity slowly   Complete by: As directed       Allergies as of 05/08/2021       Reactions   Oxycodone-acetaminophen Nausea And Vomiting   Other reaction(s): Vomiting   Ezetimibe    Other reaction(s): Unknown   Lisinopril Other (See Comments)   Statins    Other reaction(s): Other (See Comments)        Medication List     STOP taking these medications    losartan 100 MG tablet Commonly known as: COZAAR   potassium chloride 10 MEQ tablet Commonly known as: KLOR-CON   potassium chloride 10 MEQ tablet Commonly known as: KLOR-CON       TAKE these medications    ALPRAZolam 0.25 MG tablet Commonly known as: XANAX Take 0.25 mg by mouth 2 (two) times daily as needed for anxiety.   amoxicillin-clavulanate 875-125 MG tablet Commonly known as: AUGMENTIN Take 1 tablet by mouth every 12 (twelve) hours for 5 doses.   aspirin EC 81 MG tablet Take 81 mg by mouth daily.   atorvastatin 80 MG tablet Commonly known as: LIPITOR 80 mg daily at 6 PM.   Contour Next Test test strip Generic drug: glucose blood CHECK BLOOD SUGAR TID UTD   donepezil 5 MG tablet Commonly known as: ARICEPT Take 5 mg by mouth at bedtime.   furosemide 40 MG tablet Commonly known as: LASIX Take 1 tablet (40 mg total) by mouth daily as needed for fluid or edema. What changed:  when to take this reasons to take this   glipiZIDE 10 MG tablet Commonly known as: GLUCOTROL Take 10 mg by mouth 2 (two) times daily before a meal.   metoprolol succinate 100 MG 24 hr tablet Commonly known as: TOPROL-XL Take 100 mg by mouth daily.   metoprolol succinate 50 MG 24 hr tablet Commonly known as: TOPROL-XL Take 50 mg by mouth daily.   mirtazapine 15 MG disintegrating tablet Commonly known as: REMERON SOL-TAB Take 1 tablet (15 mg total) by mouth at bedtime.   Myrbetriq 25 MG Tb24 tablet Generic drug: mirabegron ER Take 25 mg by mouth daily.    QUEtiapine 50 MG tablet Commonly known as: SEROQUEL Take 50 mg by mouth at bedtime.   spironolactone 25 MG tablet Commonly known as: ALDACTONE Take 25 mg by mouth daily.   Vitamin D (Ergocalciferol) 50 MCG (2000 UT) Caps Take by mouth.   Xarelto 20 MG Tabs tablet Generic drug: rivaroxaban Take 20 mg by mouth daily.        Follow-up Information     Mortimer Fries, [place]. Schedule an appointment as soon as possible for a visit in 1 week(s).   Specialty: Internal Medicine Contact information: [address] [place] [address] [phone]                Allergies  Allergen Reactions   Oxycodone-Acetaminophen Nausea And Vomiting    Other reaction(s): Vomiting   Ezetimibe     Other reaction(s): Unknown   Lisinopril Other (See Comments)   Statins     Other reaction(s): Other (See Comments)    Consultations: None  Procedures/Studies: CT HEAD WO CONTRAST ( )  Result Date: 05/05/2021 CLINICAL DATA:  Mental status change, unknown  cause altered, nonfocal exam. eval ICH. on xarelto EXAM: CT HEAD WITHOUT CONTRAST TECHNIQUE: Contiguous axial images were obtained from the base of the skull through the vertex without intravenous contrast. COMPARISON:  Head CT 06/17/2019 FINDINGS: Brain: No acute intracranial hemorrhage. Stable degree of atrophy and chronic small vessel ischemia. Right frontal encephalomalacia is unchanged from prior exam. No evidence of acute ischemia. No hydrocephalus. No midline shift or mass effect. No subdural or extra-axial collection. Vascular: Atherosclerosis of skullbase vasculature without hyperdense vessel or abnormal calcification. Skull: No fracture or focal lesion. Sinuses/Orbits: Paranasal sinuses and mastoid air cells are clear. The visualized orbits are unremarkable. Other: None. IMPRESSION: 1. No acute intracranial abnormality. 2. Stable atrophy, chronic small vessel ischemia, and right frontal encephalomalacia. Electronically Signed    By: Narda Rutherford M.D.   On: 05/05/2021 20:08   DG Chest Portable 1 View  Result Date: 05/05/2021 CLINICAL DATA:  altered, eval infiltrate EXAM: PORTABLE CHEST 1 VIEW COMPARISON:  01/09/2019 FINDINGS: Cardiomegaly. Mild vascular congestion. No overt edema. No confluent opacities or effusions. IMPRESSION: Cardiomegaly, vascular congestion. Electronically Signed   By: Charlett Nose M.D.   On: 05/05/2021 19:04   ECHOCARDIOGRAM COMPLETE  Result Date: 05/07/2021    ECHOCARDIOGRAM REPORT   Patient Name:   Leah Ritter Date of Exam: 05/07/2021 Medical Rec #:  [id number]     Height:       69.0 in Accession #:    [id number]    Weight:       214.7 lb Date of Birth:  1951/01/22     BSA:          2.129 m Patient Age:    70 years      BP:           118/81 mmHg Patient Gender: F             HR:           98 bpm. Exam Location:  [facility] Procedure: 2D Echo, Color Doppler and Cardiac Doppler Indications:     I50.21 congestive heart failure-Acute Systolic  History:         Patient has no prior history of Echocardiogram examinations.                  Stroke, Signs/Symptoms:Dementia; Risk Factors:Hypertension,                  Diabetes and Sleep Apnea.  Sonographer:     Humphrey Rolls RDCS (AE) Referring Phys:  [id number] Oliver Pila HALL Diagnosing Phys: Alwyn Pea MD  Sonographer Comments: Technically difficult study due to poor echo windows. Image acquisition challenging due to patient body habitus and Image acquisition challenging due to uncooperative patient. IMPRESSIONS  1. Left ventricular ejection fraction, by estimation, is >75%. The left ventricle has hyperdynamic function. The left ventricle has no regional wall motion abnormalities. Left ventricular diastolic parameters are consistent with Grade I diastolic dysfunction (impaired relaxation).  2. Right ventricular systolic function is normal. The right ventricular size is normal.  3. The mitral valve is grossly normal. No evidence of mitral valve regurgitation.  4. The  aortic valve is grossly normal. Aortic valve regurgitation is not visualized. FINDINGS  Left Ventricle: Left ventricular ejection fraction, by estimation, is >75%. The left ventricle has hyperdynamic function. The left ventricle has no regional wall motion abnormalities. The left ventricular internal cavity size was normal in size. There is no left ventricular hypertrophy. Left ventricular diastolic parameters are consistent with Grade I diastolic  dysfunction (impaired relaxation). Right Ventricle: The right ventricular size is normal. No increase in right ventricular wall thickness. Right ventricular systolic function is normal. Left Atrium: Left atrial size was normal in size. Right Atrium: Right atrial size was normal in size. Pericardium: There is no evidence of pericardial effusion. Mitral Valve: The mitral valve is grossly normal. No evidence of mitral valve regurgitation. MV peak gradient, 5.4 mmHg. The mean mitral valve gradient is 2.0 mmHg. Tricuspid Valve: The tricuspid valve is normal in structure. Tricuspid valve regurgitation is not demonstrated. Aortic Valve: The aortic valve is grossly normal. Aortic valve regurgitation is not visualized. Aortic valve mean gradient measures 4.0 mmHg. Aortic valve peak gradient measures 6.1 mmHg. Aortic valve area, by VTI measures 2.64 cm. Pulmonic Valve: The pulmonic valve was grossly normal. Pulmonic valve regurgitation is not visualized. Aorta: The ascending aorta was not well visualized. IAS/Shunts: No atrial level shunt detected by color flow Doppler.  LEFT VENTRICLE PLAX 2D LVIDd:         3.88 cm  Diastology LVIDs:         2.16 cm  LV e' medial:    8.38 cm/s LV PW:         1.07 cm  LV E/e' medial:  9.6 LV IVS:        1.11 cm  LV e' lateral:   6.96 cm/s LVOT diam:     2.00 cm  LV E/e' lateral: 11.5 LV SV:         52 LV SV Index:   25 LVOT Area:     3.14 cm  LEFT ATRIUM             Index LA diam:        2.80 cm 1.32 cm/m LA Vol (A2C):   19.0 ml 8.92 ml/m LA Vol  (A4C):   25.0 ml 11.74 ml/m LA Biplane Vol: 22.0 ml 10.33 ml/m  AORTIC VALVE                   PULMONIC VALVE AV Area (Vmax):    2.66 cm    PV Vmax:       0.99 m/s AV Area (Vmean):   2.41 cm    PV Vmean:      68.900 cm/s AV Area (VTI):     2.64 cm    PV VTI:        0.184 m AV Vmax:           123.00 cm/s PV Peak grad:  3.9 mmHg AV Vmean:          87.700 cm/s PV Mean grad:  2.0 mmHg AV VTI:            0.199 m AV Peak Grad:      6.1 mmHg AV Mean Grad:      4.0 mmHg LVOT Vmax:         104.00 cm/s LVOT Vmean:        67.200 cm/s LVOT VTI:          0.167 m LVOT/AV VTI ratio: 0.84  AORTA Ao Root diam: 3.10 cm MITRAL VALVE MV Area (PHT): 6.65 cm     SHUNTS MV Area VTI:   2.85 cm     Systemic VTI:  0.17 m MV Peak grad:  5.4 mmHg     Systemic Diam: 2.00 cm MV Mean grad:  2.0 mmHg MV Vmax:       1.16 m/s MV Vmean:  63.4 cm/s MV Decel Time: 114 msec MV E velocity: 80.10 cm/s MV A velocity: 104.00 cm/s MV E/A ratio:  0.77 Dwayne Salome Arnt MD Electronically signed by Alwyn Pea MD Signature Date/Time: 05/07/2021/5:21:12 PM    Final     Subjective: Patient was seen and examined today.  She appears to be at her baseline.  All dressed up and wants to go back home.  Discharge Exam: Vitals:   05/08/21 0502 05/08/21 0800  BP: 125/64 126/62  Pulse: 75 79  Resp: 18 16  Temp: 98.7 F (37.1 C) 98 F (36.7 C)  SpO2: 99% 100%   Vitals:   05/07/21 1147 05/07/21 1927 05/08/21 0502 05/08/21 0800  BP: 123/86 120/60 125/64 126/62  Pulse: (!) 110 85 75 79  Resp: 18 18 18 16   Temp: 97.8 F (36.6 C) 98.2 F (36.8 C) 98.7 F (37.1 C) 98 F (36.7 C)  TempSrc:      SpO2:  100% 99% 100%  Weight:      Height:        General: Pt is alert, awake, not in acute distress Cardiovascular: RRR, S1/S2 +, no rubs, no gallops Respiratory: CTA bilaterally, no wheezing, no rhonchi Abdominal: Soft, NT, ND, bowel sounds + Extremities: no edema, no cyanosis   The results of significant diagnostics from this  hospitalization (including imaging, microbiology, ancillary and laboratory) are listed below for reference.    Microbiology: Recent Results (from the past 240 hour(s))  Urine Culture     Status: Abnormal   Collection Time: 05/05/21  5:54 PM   Specimen: Urine, Clean Catch  Result Value Ref Range Status   Specimen Description   Final    URINE, CLEAN CATCH Performed at [facility], [address], [place], [place] [address]    Special Requests   Final    Normal Performed at [facility], [address] [place], [place] [address]    Culture (A)  Final    60,000 COLONIES/mL LACTOBACILLUS SPECIES Standardized susceptibility testing for this organism is not available. Performed at [facility], [address] [place], [place] [address]    Report Status 05/07/2021 FINAL  Final  Resp Panel by RT-PCR (Flu A&B, Covid) Nasopharyngeal Swab     Status: None   Collection Time: 05/05/21  7:09 PM   Specimen: Nasopharyngeal Swab; Nasopharyngeal(NP) swabs in vial transport medium  Result Value Ref Range Status   SARS Coronavirus 2 by RT PCR NEGATIVE NEGATIVE Final    Comment: (NOTE) SARS-CoV-2 target nucleic acids are NOT DETECTED.  The SARS-CoV-2 RNA is generally detectable in upper respiratory specimens during the acute phase of infection. The lowest concentration of SARS-CoV-2 viral copies this assay can detect is 138 copies/mL. A negative result does not preclude SARS-Cov-2 infection and should not be used as the sole basis for treatment or other patient management decisions. A negative result may occur with  improper specimen collection/handling, submission of specimen other than nasopharyngeal swab, presence of viral mutation(s) within the areas targeted by this assay, and inadequate number of viral copies(<138 copies/mL). A negative result must be combined with clinical observations, patient history, and epidemiological information. The expected result is  Negative.  Fact Sheet for Patients:  BloggerCourse.com  Fact Sheet for Healthcare Providers:  SeriousBroker.it  This test is no t yet approved or cleared by the [place] FDA and  has been authorized for detection and/or diagnosis of SARS-CoV-2 by FDA under an Emergency Use Authorization (EUA). This EUA  will remain  in effect (meaning this test can be used) for the duration of the COVID-19 declaration under Section 564(b)(1) of the Act, 21 U.S.C.section 360bbb-3(b)(1), unless the authorization is terminated  or revoked sooner.       Influenza A by PCR NEGATIVE NEGATIVE Final   Influenza B by PCR NEGATIVE NEGATIVE Final    Comment: (NOTE) The Xpert Xpress SARS-CoV-2/FLU/RSV plus assay is intended as an aid in the diagnosis of influenza from Nasopharyngeal swab specimens and should not be used as a sole basis for treatment. Nasal washings and aspirates are unacceptable for Xpert Xpress SARS-CoV-2/FLU/RSV testing.  Fact Sheet for Patients: BloggerCourse.com  Fact Sheet for Healthcare Providers: SeriousBroker.it  This test is not yet approved or cleared by the [place] FDA and has been authorized for detection and/or diagnosis of SARS-CoV-2 by FDA under an Emergency Use Authorization (EUA). This EUA will remain in effect (meaning this test can be used) for the duration of the COVID-19 declaration under Section 564(b)(1) of the Act, 21 U.S.C. section 360bbb-3(b)(1), unless the authorization is terminated or revoked.  Performed at [facility], [address] [place], [place] [address]   CULTURE, BLOOD (ROUTINE X 2) w Reflex to ID Panel     Status: None (Preliminary result)   Collection Time: 05/06/21 12:03 AM   Specimen: BLOOD  Result Value Ref Range Status   Specimen Description BLOOD RIGHT [facility]  Final   Special Requests   Final    BOTTLES DRAWN AEROBIC AND  ANAEROBIC Blood Culture results may not be optimal due to an inadequate volume of blood received in culture bottles   Culture   Final    NO GROWTH 2 DAYS Performed at [facility], [address], [place], [place] [address]    Report Status PENDING  Incomplete  CULTURE, BLOOD (ROUTINE X 2) w Reflex to ID Panel     Status: Abnormal (Preliminary result)   Collection Time: 05/06/21 12:18 AM   Specimen: BLOOD  Result Value Ref Range Status   Specimen Description   Final    BLOOD RIGHT FA Performed at [facility], [address] [place], [place] [address]    Special Requests   Final    BOTTLES DRAWN AEROBIC ONLY Blood Culture results may not be optimal due to an inadequate volume of blood received in culture bottles Performed at [facility], [address] [place], [place] [address]    Culture  Setup Time   Final    Organism ID to follow GRAM POSITIVE COCCI AEROBIC BOTTLE ONLY Gram Stain Report Called to,Read Back By and Verified With: SUSAN WATSON 05/07/21@ 1130 BY SB Performed at [facility], [address] [place], [place] [address]    Culture (A)  Final    MICROCOCCUS LUTEUS/LYLAE THE SIGNIFICANCE OF ISOLATING THIS ORGANISM FROM A SINGLE SET OF BLOOD CULTURES WHEN MULTIPLE SETS ARE DRAWN IS UNCERTAIN. PLEASE NOTIFY THE MICROBIOLOGY DEPARTMENT WITHIN ONE WEEK IF SPECIATION AND SENSITIVITIES ARE REQUIRED. Performed at [facility], [address], [place], [place] [address]    Report Status PENDING  Incomplete  Blood Culture ID Panel (Reflexed)     Status: None   Collection Time: 05/06/21 12:18 AM  Result Value Ref Range Status   Enterococcus faecalis NOT DETECTED NOT DETECTED Final   Enterococcus Faecium NOT DETECTED NOT DETECTED Final   Listeria monocytogenes NOT DETECTED NOT DETECTED Final   Staphylococcus species NOT DETECTED NOT DETECTED Final   Staphylococcus  aureus (BCID) NOT DETECTED NOT DETECTED Final   Staphylococcus  epidermidis NOT DETECTED NOT DETECTED Final   Staphylococcus lugdunensis NOT DETECTED NOT DETECTED Final   Streptococcus species NOT DETECTED NOT DETECTED Final   Streptococcus agalactiae NOT DETECTED NOT DETECTED Final   Streptococcus pneumoniae NOT DETECTED NOT DETECTED Final   Streptococcus pyogenes NOT DETECTED NOT DETECTED Final   A.calcoaceticus-baumannii NOT DETECTED NOT DETECTED Final   Bacteroides fragilis NOT DETECTED NOT DETECTED Final   Enterobacterales NOT DETECTED NOT DETECTED Final   Enterobacter cloacae complex NOT DETECTED NOT DETECTED Final   Escherichia coli NOT DETECTED NOT DETECTED Final   Klebsiella aerogenes NOT DETECTED NOT DETECTED Final   Klebsiella oxytoca NOT DETECTED NOT DETECTED Final   Klebsiella pneumoniae NOT DETECTED NOT DETECTED Final   Proteus species NOT DETECTED NOT DETECTED Final   Salmonella species NOT DETECTED NOT DETECTED Final   Serratia marcescens NOT DETECTED NOT DETECTED Final   Haemophilus influenzae NOT DETECTED NOT DETECTED Final   Neisseria meningitidis NOT DETECTED NOT DETECTED Final   Pseudomonas aeruginosa NOT DETECTED NOT DETECTED Final   Stenotrophomonas maltophilia NOT DETECTED NOT DETECTED Final   Candida albicans NOT DETECTED NOT DETECTED Final   Candida auris NOT DETECTED NOT DETECTED Final   Candida glabrata NOT DETECTED NOT DETECTED Final   Candida krusei NOT DETECTED NOT DETECTED Final   Candida parapsilosis NOT DETECTED NOT DETECTED Final   Candida tropicalis NOT DETECTED NOT DETECTED Final   Cryptococcus neoformans/gattii NOT DETECTED NOT DETECTED Final    Comment: Performed at [facility], [address] [place], [place] [address]  Resp Panel by RT-PCR (Flu A&B, Covid) Nasopharyngeal Swab     Status: None   Collection Time: 05/07/21  5:00 PM   Specimen: Nasopharyngeal Swab; Nasopharyngeal(NP) swabs in vial transport medium  Result Value Ref Range Status   SARS Coronavirus 2 by RT PCR NEGATIVE NEGATIVE Final     Comment: (NOTE) SARS-CoV-2 target nucleic acids are NOT DETECTED.  The SARS-CoV-2 RNA is generally detectable in upper respiratory specimens during the acute phase of infection. The lowest concentration of SARS-CoV-2 viral copies this assay can detect is 138 copies/mL. A negative result does not preclude SARS-Cov-2 infection and should not be used as the sole basis for treatment or other patient management decisions. A negative result may occur with  improper specimen collection/handling, submission of specimen other than nasopharyngeal swab, presence of viral mutation(s) within the areas targeted by this assay, and inadequate number of viral copies(<138 copies/mL). A negative result must be combined with clinical observations, patient history, and epidemiological information. The expected result is Negative.  Fact Sheet for Patients:  BloggerCourse.comhttps://www.fda.gov/media/152166/download  Fact Sheet for Healthcare Providers:  SeriousBroker.ithttps://www.fda.gov/media/152162/download  This test is no t yet approved or cleared by the [place] FDA and  has been authorized for detection and/or diagnosis of SARS-CoV-2 by FDA under an Emergency Use Authorization (EUA). This EUA will remain  in effect (meaning this test can be used) for the duration of the COVID-19 declaration under Section 564(b)(1) of the Act, 21 U.S.C.section 360bbb-3(b)(1), unless the authorization is terminated  or revoked sooner.       Influenza A by PCR NEGATIVE NEGATIVE Final   Influenza B by PCR NEGATIVE NEGATIVE Final    Comment: (NOTE) The Xpert Xpress SARS-CoV-2/FLU/RSV plus assay is intended as an aid in the diagnosis of influenza from Nasopharyngeal swab specimens and should not be used as a sole basis for treatment. Nasal washings and aspirates are unacceptable  for Xpert Xpress SARS-CoV-2/FLU/RSV testing.  Fact Sheet for Patients: BloggerCourse.com  Fact Sheet for Healthcare  Providers: SeriousBroker.it  This test is not yet approved or cleared by the [place] FDA and has been authorized for detection and/or diagnosis of SARS-CoV-2 by FDA under an Emergency Use Authorization (EUA). This EUA will remain in effect (meaning this test can be used) for the duration of the COVID-19 declaration under Section 564(b)(1) of the Act, 21 U.S.C. section 360bbb-3(b)(1), unless the authorization is terminated or revoked.  Performed at [facility], [address] [place], [place] [address]      Labs: BNP (last 3 results) Recent Labs    05/06/21 0003  BNP 24.4   Basic Metabolic Panel: Recent Labs  Lab 05/05/21 1754 05/06/21 0003 05/06/21 0333 05/07/21 0506 05/08/21 0641  NA 132* 134* 140 135 133*  K 6.1* 4.7 5.3* 4.1 4.1  CL 98 100 102 103 99  CO2 23 24 27 25 25   GLUCOSE 458* 426* 107* 149* 208*  BUN 58* 55* 51* 40* 35*  CREATININE 2.62* 2.35* 1.96* 1.54* 1.41*  CALCIUM 8.8* 8.9 9.6 9.2 9.1  PHOS  --   --  3.1  --   --    Liver Function Tests: Recent Labs  Lab 05/05/21 1754 05/06/21 0333  AST 33  --   ALT 18  --   ALKPHOS 85  --   BILITOT 1.0  --   PROT 7.5  --   ALBUMIN 3.5 3.4*   No results for input(s): LIPASE, AMYLASE in the last 168 hours. Recent Labs  Lab 05/06/21 0003  AMMONIA 10   CBC: Recent Labs  Lab 05/05/21 1754 05/07/21 0506  WBC 10.5 11.1*  NEUTROABS 8.4*  --   HGB 9.5* 10.5*  HCT 29.2* 31.4*  MCV 92.1 92.4  PLT 244 268   Cardiac Enzymes: No results for input(s): CKTOTAL, CKMB, CKMBINDEX, TROPONINI in the last 168 hours. BNP: Invalid input(s): POCBNP CBG: Recent Labs  Lab 05/07/21 1146 05/07/21 1544 05/07/21 1928 05/08/21 0758 05/08/21 1126  GLUCAP 231* 159* 185* 188* 201*   D-Dimer No results for input(s): DDIMER in the last 72 hours. Hgb A1c Recent Labs    05/06/21 0003  HGBA1C 10.5*   Lipid Profile No results for input(s): CHOL, HDL, LDLCALC, TRIG,  CHOLHDL, LDLDIRECT in the last 72 hours. Thyroid function studies No results for input(s): TSH, T4TOTAL, T3FREE, THYROIDAB in the last 72 hours.  Invalid input(s): FREET3 Anemia work up No results for input(s): VITAMINB12, FOLATE, FERRITIN, TIBC, IRON, RETICCTPCT in the last 72 hours. Urinalysis    Component Value Date/Time   COLORURINE YELLOW (A) 05/05/2021 1754   APPEARANCEUR HAZY (A) 05/05/2021 1754   LABSPEC 1.014 05/05/2021 1754   PHURINE 5.0 05/05/2021 1754   GLUCOSEU >=500 (A) 05/05/2021 1754   HGBUR MODERATE (A) 05/05/2021 1754   BILIRUBINUR NEGATIVE 05/05/2021 1754   KETONESUR NEGATIVE 05/05/2021 1754   PROTEINUR NEGATIVE 05/05/2021 1754   NITRITE NEGATIVE 05/05/2021 1754   LEUKOCYTESUR NEGATIVE 05/05/2021 1754   Sepsis Labs Invalid input(s): PROCALCITONIN,  WBC,  LACTICIDVEN Microbiology Recent Results (from the past 240 hour(s))  Urine Culture     Status: Abnormal   Collection Time: 05/05/21  5:54 PM   Specimen: Urine, Clean Catch  Result Value Ref Range Status   Specimen Description   Final    URINE, CLEAN CATCH Performed at [facility], [address], [place], [place]    Special Requests   Final  Normal Performed at [facility], [address] [place], [place] [address]    Culture (A)  Final    60,000 COLONIES/mL LACTOBACILLUS SPECIES Standardized susceptibility testing for this organism is not available. Performed at [facility], [address] [place], [place] [address]    Report Status 05/07/2021 FINAL  Final  Resp Panel by RT-PCR (Flu A&B, Covid) Nasopharyngeal Swab     Status: None   Collection Time: 05/05/21  7:09 PM   Specimen: Nasopharyngeal Swab; Nasopharyngeal(NP) swabs in vial transport medium  Result Value Ref Range Status   SARS Coronavirus 2 by RT PCR NEGATIVE NEGATIVE Final    Comment: (NOTE) SARS-CoV-2 target nucleic acids are NOT DETECTED.  The SARS-CoV-2 RNA is generally detectable in upper  respiratory specimens during the acute phase of infection. The lowest concentration of SARS-CoV-2 viral copies this assay can detect is 138 copies/mL. A negative result does not preclude SARS-Cov-2 infection and should not be used as the sole basis for treatment or other patient management decisions. A negative result may occur with  improper specimen collection/handling, submission of specimen other than nasopharyngeal swab, presence of viral mutation(s) within the areas targeted by this assay, and inadequate number of viral copies(<138 copies/mL). A negative result must be combined with clinical observations, patient history, and epidemiological information. The expected result is Negative.  Fact Sheet for Patients:  BloggerCourse.com  Fact Sheet for Healthcare Providers:  SeriousBroker.it  This test is no t yet approved or cleared by the [place] FDA and  has been authorized for detection and/or diagnosis of SARS-CoV-2 by FDA under an Emergency Use Authorization (EUA). This EUA will remain  in effect (meaning this test can be used) for the duration of the COVID-19 declaration under Section 564(b)(1) of the Act, 21 U.S.C.section 360bbb-3(b)(1), unless the authorization is terminated  or revoked sooner.       Influenza A by PCR NEGATIVE NEGATIVE Final   Influenza B by PCR NEGATIVE NEGATIVE Final    Comment: (NOTE) The Xpert Xpress SARS-CoV-2/FLU/RSV plus assay is intended as an aid in the diagnosis of influenza from Nasopharyngeal swab specimens and should not be used as a sole basis for treatment. Nasal washings and aspirates are unacceptable for Xpert Xpress SARS-CoV-2/FLU/RSV testing.  Fact Sheet for Patients: BloggerCourse.com  Fact Sheet for Healthcare Providers: SeriousBroker.it  This test is not yet approved or cleared by the [place] FDA and has been  authorized for detection and/or diagnosis of SARS-CoV-2 by FDA under an Emergency Use Authorization (EUA). This EUA will remain in effect (meaning this test can be used) for the duration of the COVID-19 declaration under Section 564(b)(1) of the Act, 21 U.S.C. section 360bbb-3(b)(1), unless the authorization is terminated or revoked.  Performed at [facility], [address] [place], [place] [address]   CULTURE, BLOOD (ROUTINE X 2) w Reflex to ID Panel     Status: None (Preliminary result)   Collection Time: 05/06/21 12:03 AM   Specimen: BLOOD  Result Value Ref Range Status   Specimen Description BLOOD RIGHT [facility]  Final   Special Requests   Final    BOTTLES DRAWN AEROBIC AND ANAEROBIC Blood Culture results may not be optimal due to an inadequate volume of blood received in culture bottles   Culture   Final    NO GROWTH 2 DAYS Performed at [facility], [address] [place], [place] [address]    Report Status PENDING  Incomplete  CULTURE, BLOOD (ROUTINE X  2) w Reflex to ID Panel     Status: Abnormal (Preliminary result)   Collection Time: 05/06/21 12:18 AM   Specimen: BLOOD  Result Value Ref Range Status   Specimen Description   Final    BLOOD RIGHT FA Performed at [facility], [address], [place], [place] [address]    Special Requests   Final    BOTTLES DRAWN AEROBIC ONLY Blood Culture results may not be optimal due to an inadequate volume of blood received in culture bottles Performed at [facility], [address] [place], [place] [address]    Culture  Setup Time   Final    Organism ID to follow GRAM POSITIVE COCCI AEROBIC BOTTLE ONLY Gram Stain Report Called to,Read Back By and Verified With: SUSAN WATSON 05/07/21@ 1130 BY SB Performed at [facility], [address] [place], [place] [address]    Culture (A)  Final    MICROCOCCUS LUTEUS/LYLAE THE SIGNIFICANCE OF ISOLATING THIS ORGANISM FROM A SINGLE SET OF BLOOD  CULTURES WHEN MULTIPLE SETS ARE DRAWN IS UNCERTAIN. PLEASE NOTIFY THE MICROBIOLOGY DEPARTMENT WITHIN ONE WEEK IF SPECIATION AND SENSITIVITIES ARE REQUIRED. Performed at [facility], [address] [place], [place] [address]    Report Status PENDING  Incomplete  Blood Culture ID Panel (Reflexed)     Status: None   Collection Time: 05/06/21 12:18 AM  Result Value Ref Range Status   Enterococcus faecalis NOT DETECTED NOT DETECTED Final   Enterococcus Faecium NOT DETECTED NOT DETECTED Final   Listeria monocytogenes NOT DETECTED NOT DETECTED Final   Staphylococcus species NOT DETECTED NOT DETECTED Final   Staphylococcus aureus (BCID) NOT DETECTED NOT DETECTED Final   Staphylococcus epidermidis NOT DETECTED NOT DETECTED Final   Staphylococcus lugdunensis NOT DETECTED NOT DETECTED Final   Streptococcus species NOT DETECTED NOT DETECTED Final   Streptococcus agalactiae NOT DETECTED NOT DETECTED Final   Streptococcus pneumoniae NOT DETECTED NOT DETECTED Final   Streptococcus pyogenes NOT DETECTED NOT DETECTED Final   A.calcoaceticus-baumannii NOT DETECTED NOT DETECTED Final   Bacteroides fragilis NOT DETECTED NOT DETECTED Final   Enterobacterales NOT DETECTED NOT DETECTED Final   Enterobacter cloacae complex NOT DETECTED NOT DETECTED Final   Escherichia coli NOT DETECTED NOT DETECTED Final   Klebsiella aerogenes NOT DETECTED NOT DETECTED Final   Klebsiella oxytoca NOT DETECTED NOT DETECTED Final   Klebsiella pneumoniae NOT DETECTED NOT DETECTED Final   Proteus species NOT DETECTED NOT DETECTED Final   Salmonella species NOT DETECTED NOT DETECTED Final   Serratia marcescens NOT DETECTED NOT DETECTED Final   Haemophilus influenzae NOT DETECTED NOT DETECTED Final   Neisseria meningitidis NOT DETECTED NOT DETECTED Final   Pseudomonas aeruginosa NOT DETECTED NOT DETECTED Final   Stenotrophomonas maltophilia NOT DETECTED NOT DETECTED Final   Candida albicans NOT DETECTED NOT DETECTED Final    Candida auris NOT DETECTED NOT DETECTED Final   Candida glabrata NOT DETECTED NOT DETECTED Final   Candida krusei NOT DETECTED NOT DETECTED Final   Candida parapsilosis NOT DETECTED NOT DETECTED Final   Candida tropicalis NOT DETECTED NOT DETECTED Final   Cryptococcus neoformans/gattii NOT DETECTED NOT DETECTED Final    Comment: Performed at [facility], [address] [place], [place] [address]  Resp Panel by RT-PCR (Flu A&B, Covid) Nasopharyngeal Swab     Status: None   Collection Time: 05/07/21  5:00 PM   Specimen: Nasopharyngeal Swab; Nasopharyngeal(NP) swabs in vial transport medium  Result Value Ref Range Status  SARS Coronavirus 2 by RT PCR NEGATIVE NEGATIVE Final    Comment: (NOTE) SARS-CoV-2 target nucleic acids are NOT DETECTED.  The SARS-CoV-2 RNA is generally detectable in upper respiratory specimens during the acute phase of infection. The lowest concentration of SARS-CoV-2 viral copies this assay can detect is 138 copies/mL. A negative result does not preclude SARS-Cov-2 infection and should not be used as the sole basis for treatment or other patient management decisions. A negative result may occur with  improper specimen collection/handling, submission of specimen other than nasopharyngeal swab, presence of viral mutation(s) within the areas targeted by this assay, and inadequate number of viral copies(<138 copies/mL). A negative result must be combined with clinical observations, patient history, and epidemiological information. The expected result is Negative.  Fact Sheet for Patients:  BloggerCourse.com  Fact Sheet for Healthcare Providers:  SeriousBroker.it  This test is no t yet approved or cleared by the [place] FDA and  has been authorized for detection and/or diagnosis of SARS-CoV-2 by FDA under an Emergency Use Authorization (EUA). This EUA will remain  in effect (meaning this test  can be used) for the duration of the COVID-19 declaration under Section 564(b)(1) of the Act, 21 U.S.C.section 360bbb-3(b)(1), unless the authorization is terminated  or revoked sooner.       Influenza A by PCR NEGATIVE NEGATIVE Final   Influenza B by PCR NEGATIVE NEGATIVE Final    Comment: (NOTE) The Xpert Xpress SARS-CoV-2/FLU/RSV plus assay is intended as an aid in the diagnosis of influenza from Nasopharyngeal swab specimens and should not be used as a sole basis for treatment. Nasal washings and aspirates are unacceptable for Xpert Xpress SARS-CoV-2/FLU/RSV testing.  Fact Sheet for Patients: BloggerCourse.com  Fact Sheet for Healthcare Providers: SeriousBroker.it  This test is not yet approved or cleared by the [place] FDA and has been authorized for detection and/or diagnosis of SARS-CoV-2 by FDA under an Emergency Use Authorization (EUA). This EUA will remain in effect (meaning this test can be used) for the duration of the COVID-19 declaration under Section 564(b)(1) of the Act, 21 U.S.C. section 360bbb-3(b)(1), unless the authorization is terminated or revoked.  Performed at [facility], [address] [place], [place] [address]     Time coordinating discharge: Over 30 minutes  SIGNED:  Arnetha Courser, MD  [facility] 05/08/2021, 12:00 PM  If 7PM-7AM, please contact night-coverage [email or web address]  This record has been created using Conservation officer, historic buildings. Errors have been sought and corrected,but may not always be located. Such creation errors do not reflect on the standard of care.

## 2021-05-08 NOTE — NC FL2 (Signed)
[place] MEDICAID FL2 LEVEL OF CARE SCREENING TOOL     IDENTIFICATION  Patient Name: Leah Ritter Birthdate: 03-01-1951 Sex: female Admission Date (Current Location): 05/05/2021  [facility] and [place] Number:  Chiropodist and Address:  [facility], [address] [place], [place] [address]      Provider Number: [id number]  Attending Physician Name and Address:  Arnetha Courser, MD  Relative Name and Phone Number:  Fredric Mare (Daughter)   [phone] [facility])    Current Level of Care: Hospital Recommended Level of Care: Assisted Living Facility, Memory Care Prior Approval Number:    Date Approved/Denied:   PASRR Number: [id number]  Discharge Plan: Other (Comment) [facility] [phone] [place], [place] [address]OZDGU: [phone])    Current Diagnoses: Patient Active Problem List   Diagnosis Date Noted   AKI (acute kidney injury) (HCC)    Hyperglycemia    AMS (altered mental status) 05/05/2021   SIRS (systemic inflammatory response syndrome) (HCC) 01/09/2019   Acute on chronic systolic CHF (congestive heart failure) (HCC) 01/09/2019   Pain in joint involving ankle and foot 01/03/2018   Localized, primary osteoarthritis of shoulder region 01/03/2018   Rupture of tendon of biceps, long head 01/03/2018   Disorder of bursae of shoulder region 01/03/2018   Screening declined by patient 10/25/2017   Full thickness rotator cuff tear 04/21/2016   Chronic bilateral low back pain without sciatica 02/10/2016   H/O rotator cuff tear 02/10/2016   History of vitamin D deficiency 02/10/2016   Type 2 diabetes mellitus without complication, with long-term current use of insulin (HCC) 02/10/2016   Diabetes (HCC) 11/24/2006   HLD (hyperlipidemia) 11/24/2006   OBESITY, NOS 11/24/2006   POST TRAUMATIC STRESS DISORDER 11/24/2006   DEPRESSIVE DISORDER, NOS 11/24/2006   HYPERTENSION, BENIGN SYSTEMIC 11/24/2006   RHINITIS, ALLERGIC 11/24/2006   GERD  (gastroesophageal reflux disease) 11/24/2006   OSTEOARTHRITIS, MULTI SITES 11/24/2006   APNEA, SLEEP 11/24/2006    Orientation RESPIRATION BLADDER Height & Weight     Self, Time, Situation, Place  Normal (99% on room air) Incontinent, External catheter Weight: 97.4 kg Height:  5\' 9"  (175.3 cm)  BEHAVIORAL SYMPTOMS/MOOD NEUROLOGICAL BOWEL NUTRITION STATUS      Incontinent Diet (mechanical soft)  AMBULATORY STATUS COMMUNICATION OF NEEDS Skin   Limited Assist Verbally Normal, Other (Comment) (Dry Flaky rash, bilateral legs)                       Personal Care Assistance Level of Assistance  Bathing, Feeding, Dressing Bathing Assistance: Limited assistance Feeding assistance: Limited assistance Dressing Assistance: Limited assistance     Functional Limitations Info  Sight, Hearing, Speech Sight Info: Adequate Hearing Info: Adequate Speech Info: Adequate (Dentures)    SPECIAL CARE FACTORS FREQUENCY  PT (By licensed PT), OT (By licensed OT)     PT Frequency: PT eval and treat at [facility] 2-3 times per week OT Frequency: OT eval and treat at [facility] 2-3 tims per week            Contractures Contractures Info: Not present    Additional Factors Info  Code Status, Allergies Code Status Info: FULL CODE Allergies Info: Oxycodone-acetaminophen,  Ezetimibe, Lisinopril, Statins           Current Medications (05/08/2021):  This is the current hospital active medication list Current Facility-Administered Medications  Medication Dose Route Frequency Provider Last Rate Last Admin   amoxicillin-clavulanate (AUGMENTIN) 875-125 MG per tablet 1 tablet  1 tablet Oral Q12H Arnetha Courser, MD   1 tablet at 05/08/21 0805   haloperidol lactate (HALDOL) injection 1 mg  1 mg Intramuscular Q6H PRN Mansy, Jan A, MD       insulin aspart (novoLOG) injection 0-15 Units  0-15 Units Subcutaneous TID WC Arnetha Courser, MD   5 Units at 05/08/21 1135   insulin aspart (novoLOG) injection  0-5 Units  0-5 Units Subcutaneous QHS Arnetha Courser, MD       insulin aspart (novoLOG) injection 3 Units  3 Units Subcutaneous TID WC Arnetha Courser, MD   3 Units at 05/08/21 1135   metoprolol succinate (TOPROL-XL) 24 hr tablet 50 mg  50 mg Oral Daily Arnetha Courser, MD   50 mg at 05/08/21 0806   mirtazapine (REMERON SOL-TAB) disintegrating tablet 15 mg  15 mg Oral QHS Arnetha Courser, MD   15 mg at 05/06/21 2022   QUEtiapine (SEROQUEL) tablet 25 mg  25 mg Oral Daily Arnetha Courser, MD   25 mg at 05/08/21 1093   rivaroxaban (XARELTO) tablet 20 mg  20 mg Oral Q supper Jaynie Bream, RPH   20 mg at 05/07/21 1657   traZODone (DESYREL) tablet 25 mg  25 mg Oral QHS PRN Mansy, Vernetta Honey, MD         Discharge Medications: Medication List       STOP taking these medications     losartan 100 MG tablet Commonly known as: COZAAR    potassium chloride 10 MEQ tablet Commonly known as: KLOR-CON    potassium chloride 10 MEQ tablet Commonly known as: KLOR-CON           TAKE these medications     ALPRAZolam 0.25 MG tablet Commonly known as: XANAX Take 0.25 mg by mouth 2 (two) times daily as needed for anxiety.    amoxicillin-clavulanate 875-125 MG tablet Commonly known as: AUGMENTIN Take 1 tablet by mouth every 12 (twelve) hours for 5 doses.    aspirin EC 81 MG tablet Take 81 mg by mouth daily.    atorvastatin 80 MG tablet Commonly known as: LIPITOR 80 mg daily at 6 PM.    Contour Next Test test strip Generic drug: glucose blood CHECK BLOOD SUGAR TID UTD    donepezil 5 MG tablet Commonly known as: ARICEPT Take 5 mg by mouth at bedtime.    furosemide 40 MG tablet Commonly known as: LASIX Take 1 tablet (40 mg total) by mouth daily as needed for fluid or edema. What changed:  when to take this reasons to take this    glipiZIDE 10 MG tablet Commonly known as: GLUCOTROL Take 10 mg by mouth 2 (two) times daily before a meal.    metoprolol succinate 100 MG 24 hr tablet Commonly  known as: TOPROL-XL Take 100 mg by mouth daily.    metoprolol succinate 50 MG 24 hr tablet Commonly known as: TOPROL-XL Take 50 mg by mouth daily.    mirtazapine 15 MG disintegrating tablet Commonly known as: REMERON SOL-TAB Take 1 tablet (15 mg total) by mouth at bedtime.    Myrbetriq 25 MG Tb24 tablet Generic drug: mirabegron ER Take 25 mg by mouth daily.    QUEtiapine 50 MG tablet Commonly known as: SEROQUEL Take 50 mg by mouth at bedtime.    spironolactone 25 MG tablet Commonly known as: ALDACTONE Take 25 mg by mouth daily.    Vitamin D (Ergocalciferol) 50 MCG (2000 UT) Caps Take by mouth.    Xarelto 20 MG Tabs  tablet Generic drug: rivaroxaban Take 20 mg by mouth daily.      Please see discharge summary for a list of discharge medications.  Relevant Imaging Results:  Relevant Lab Results:   Additional Information SSN [id number]  Allayne Butcher, RN

## 2021-05-09 LAB — CULTURE, BLOOD (ROUTINE X 2)

## 2021-05-11 LAB — CULTURE, BLOOD (ROUTINE X 2): Culture: NO GROWTH

## 2021-07-06 ENCOUNTER — Emergency Department: Payer: Medicare Other

## 2021-07-06 ENCOUNTER — Inpatient Hospital Stay
Admission: EM | Admit: 2021-07-06 | Discharge: 2021-07-14 | DRG: 871 | Disposition: A | Discharge status: Skilled Nursing Facility | Payer: Medicare Other | Attending: Internal Medicine | Admitting: Internal Medicine

## 2021-07-06 ENCOUNTER — Inpatient Hospital Stay: Payer: Medicare Other

## 2021-07-06 ENCOUNTER — Other Ambulatory Visit: Payer: Self-pay

## 2021-07-06 DIAGNOSIS — R112 Nausea with vomiting, unspecified: Secondary | ICD-10-CM | POA: Diagnosis not present

## 2021-07-06 DIAGNOSIS — I5032 Chronic diastolic (congestive) heart failure: Secondary | ICD-10-CM | POA: Diagnosis present

## 2021-07-06 DIAGNOSIS — A419 Sepsis, unspecified organism: Principal | ICD-10-CM | POA: Diagnosis present

## 2021-07-06 DIAGNOSIS — E875 Hyperkalemia: Secondary | ICD-10-CM

## 2021-07-06 DIAGNOSIS — I130 Hypertensive heart and chronic kidney disease with heart failure and stage 1 through stage 4 chronic kidney disease, or unspecified chronic kidney disease: Secondary | ICD-10-CM | POA: Diagnosis present

## 2021-07-06 DIAGNOSIS — Z6831 Body mass index (BMI) 31.0-31.9, adult: Secondary | ICD-10-CM | POA: Diagnosis not present

## 2021-07-06 DIAGNOSIS — R680 Hypothermia, not associated with low environmental temperature: Secondary | ICD-10-CM | POA: Diagnosis present

## 2021-07-06 DIAGNOSIS — Z8673 Personal history of transient ischemic attack (TIA), and cerebral infarction without residual deficits: Secondary | ICD-10-CM | POA: Diagnosis not present

## 2021-07-06 DIAGNOSIS — Z7901 Long term (current) use of anticoagulants: Secondary | ICD-10-CM | POA: Diagnosis not present

## 2021-07-06 DIAGNOSIS — Z7982 Long term (current) use of aspirin: Secondary | ICD-10-CM | POA: Diagnosis not present

## 2021-07-06 DIAGNOSIS — G4733 Obstructive sleep apnea (adult) (pediatric): Secondary | ICD-10-CM | POA: Diagnosis present

## 2021-07-06 DIAGNOSIS — K219 Gastro-esophageal reflux disease without esophagitis: Secondary | ICD-10-CM | POA: Diagnosis present

## 2021-07-06 DIAGNOSIS — Z9851 Tubal ligation status: Secondary | ICD-10-CM | POA: Diagnosis not present

## 2021-07-06 DIAGNOSIS — F03B4 Unspecified dementia, moderate, with anxiety: Secondary | ICD-10-CM | POA: Diagnosis present

## 2021-07-06 DIAGNOSIS — E1122 Type 2 diabetes mellitus with diabetic chronic kidney disease: Secondary | ICD-10-CM | POA: Diagnosis present

## 2021-07-06 DIAGNOSIS — I4891 Unspecified atrial fibrillation: Secondary | ICD-10-CM | POA: Diagnosis present

## 2021-07-06 DIAGNOSIS — Z79899 Other long term (current) drug therapy: Secondary | ICD-10-CM

## 2021-07-06 DIAGNOSIS — N183 Chronic kidney disease, stage 3 unspecified: Secondary | ICD-10-CM | POA: Diagnosis present

## 2021-07-06 DIAGNOSIS — Z7984 Long term (current) use of oral hypoglycemic drugs: Secondary | ICD-10-CM

## 2021-07-06 DIAGNOSIS — N1831 Chronic kidney disease, stage 3a: Secondary | ICD-10-CM | POA: Diagnosis not present

## 2021-07-06 DIAGNOSIS — T68XXXA Hypothermia, initial encounter: Secondary | ICD-10-CM

## 2021-07-06 DIAGNOSIS — E785 Hyperlipidemia, unspecified: Secondary | ICD-10-CM | POA: Diagnosis present

## 2021-07-06 DIAGNOSIS — Z20822 Contact with and (suspected) exposure to covid-19: Secondary | ICD-10-CM | POA: Diagnosis present

## 2021-07-06 DIAGNOSIS — J690 Pneumonitis due to inhalation of food and vomit: Secondary | ICD-10-CM | POA: Diagnosis present

## 2021-07-06 DIAGNOSIS — Z888 Allergy status to other drugs, medicaments and biological substances status: Secondary | ICD-10-CM

## 2021-07-06 DIAGNOSIS — I639 Cerebral infarction, unspecified: Secondary | ICD-10-CM | POA: Diagnosis not present

## 2021-07-06 DIAGNOSIS — N1832 Chronic kidney disease, stage 3b: Secondary | ICD-10-CM | POA: Diagnosis present

## 2021-07-06 DIAGNOSIS — Z90711 Acquired absence of uterus with remaining cervical stump: Secondary | ICD-10-CM

## 2021-07-06 DIAGNOSIS — I10 Essential (primary) hypertension: Secondary | ICD-10-CM | POA: Diagnosis not present

## 2021-07-06 DIAGNOSIS — Z66 Do not resuscitate: Secondary | ICD-10-CM | POA: Diagnosis present

## 2021-07-06 DIAGNOSIS — G9341 Metabolic encephalopathy: Secondary | ICD-10-CM | POA: Diagnosis present

## 2021-07-06 DIAGNOSIS — E669 Obesity, unspecified: Secondary | ICD-10-CM | POA: Diagnosis present

## 2021-07-06 DIAGNOSIS — I4821 Permanent atrial fibrillation: Secondary | ICD-10-CM | POA: Diagnosis present

## 2021-07-06 DIAGNOSIS — Z885 Allergy status to narcotic agent status: Secondary | ICD-10-CM

## 2021-07-06 DIAGNOSIS — F418 Other specified anxiety disorders: Secondary | ICD-10-CM | POA: Diagnosis present

## 2021-07-06 DIAGNOSIS — E871 Hypo-osmolality and hyponatremia: Secondary | ICD-10-CM | POA: Diagnosis present

## 2021-07-06 DIAGNOSIS — Z8249 Family history of ischemic heart disease and other diseases of the circulatory system: Secondary | ICD-10-CM

## 2021-07-06 DIAGNOSIS — Z841 Family history of disorders of kidney and ureter: Secondary | ICD-10-CM

## 2021-07-06 DIAGNOSIS — E1129 Type 2 diabetes mellitus with other diabetic kidney complication: Secondary | ICD-10-CM | POA: Diagnosis present

## 2021-07-06 DIAGNOSIS — R0602 Shortness of breath: Secondary | ICD-10-CM

## 2021-07-06 LAB — CBC WITH DIFFERENTIAL/PLATELET
Abs Immature Granulocytes: 0.04 10*3/uL (ref 0.00–0.07)
Basophils Absolute: 0 10*3/uL (ref 0.0–0.1)
Basophils Relative: 0 %
Eosinophils Absolute: 0 10*3/uL (ref 0.0–0.5)
Eosinophils Relative: 0 %
HCT: 33.9 % — ABNORMAL LOW (ref 36.0–46.0)
Hemoglobin: 11.5 g/dL — ABNORMAL LOW (ref 12.0–15.0)
Immature Granulocytes: 0 %
Lymphocytes Relative: 6 %
Lymphs Abs: 0.7 10*3/uL (ref 0.7–4.0)
MCH: 29.8 pg (ref 26.0–34.0)
MCHC: 33.9 g/dL (ref 30.0–36.0)
MCV: 87.8 fL (ref 80.0–100.0)
Monocytes Absolute: 0.5 10*3/uL (ref 0.1–1.0)
Monocytes Relative: 4 %
Neutro Abs: 9.9 10*3/uL — ABNORMAL HIGH (ref 1.7–7.7)
Neutrophils Relative %: 90 %
Platelets: 256 10*3/uL (ref 150–400)
RBC: 3.86 MIL/uL — ABNORMAL LOW (ref 3.87–5.11)
RDW: 13.9 % (ref 11.5–15.5)
WBC: 11.1 10*3/uL — ABNORMAL HIGH (ref 4.0–10.5)
nRBC: 0 % (ref 0.0–0.2)

## 2021-07-06 LAB — LIPASE, BLOOD: Lipase: 42 U/L (ref 11–51)

## 2021-07-06 LAB — RESP PANEL BY RT-PCR (FLU A&B, COVID) ARPGX2
Influenza A by PCR: NEGATIVE
Influenza B by PCR: NEGATIVE
SARS Coronavirus 2 by RT PCR: NEGATIVE

## 2021-07-06 LAB — CORTISOL: Cortisol, Plasma: 30.9 ug/dL

## 2021-07-06 LAB — URINALYSIS, COMPLETE (UACMP) WITH MICROSCOPIC
Bacteria, UA: NONE SEEN
Specific Gravity, Urine: 1.025 (ref 1.005–1.030)

## 2021-07-06 LAB — BASIC METABOLIC PANEL
Anion gap: 8 (ref 5–15)
BUN: 29 mg/dL — ABNORMAL HIGH (ref 8–23)
CO2: 24 mmol/L (ref 22–32)
Calcium: 8.7 mg/dL — ABNORMAL LOW (ref 8.9–10.3)
Chloride: 101 mmol/L (ref 98–111)
Creatinine, Ser: 1.12 mg/dL — ABNORMAL HIGH (ref 0.44–1.00)
GFR, Estimated: 53 mL/min — ABNORMAL LOW (ref >60)
Glucose, Bld: 273 mg/dL — ABNORMAL HIGH (ref 70–99)
Potassium: 5.6 mmol/L — ABNORMAL HIGH (ref 3.5–5.1)
Sodium: 133 mmol/L — ABNORMAL LOW (ref 135–145)

## 2021-07-06 LAB — COMPREHENSIVE METABOLIC PANEL
ALT: 32 U/L (ref 0–44)
AST: 28 U/L (ref 15–41)
Albumin: 3.3 g/dL — ABNORMAL LOW (ref 3.5–5.0)
Alkaline Phosphatase: 112 U/L (ref 38–126)
Anion gap: 11 (ref 5–15)
BUN: 30 mg/dL — ABNORMAL HIGH (ref 8–23)
CO2: 25 mmol/L (ref 22–32)
Calcium: 9.7 mg/dL (ref 8.9–10.3)
Chloride: 96 mmol/L — ABNORMAL LOW (ref 98–111)
Creatinine, Ser: 1.17 mg/dL — ABNORMAL HIGH (ref 0.44–1.00)
GFR, Estimated: 50 mL/min — ABNORMAL LOW (ref >60)
Glucose, Bld: 291 mg/dL — ABNORMAL HIGH (ref 70–99)
Potassium: 5.9 mmol/L — ABNORMAL HIGH (ref 3.5–5.1)
Sodium: 132 mmol/L — ABNORMAL LOW (ref 135–145)
Total Bilirubin: 1 mg/dL (ref 0.3–1.2)
Total Protein: 8.2 g/dL — ABNORMAL HIGH (ref 6.5–8.1)

## 2021-07-06 LAB — GLUCOSE, CAPILLARY
Glucose-Capillary: 195 mg/dL — ABNORMAL HIGH (ref 70–99)
Glucose-Capillary: 196 mg/dL — ABNORMAL HIGH (ref 70–99)

## 2021-07-06 LAB — BRAIN NATRIURETIC PEPTIDE: B Natriuretic Peptide: 21.4 pg/mL (ref 0.0–100.0)

## 2021-07-06 LAB — CBG MONITORING, ED: Glucose-Capillary: 251 mg/dL — ABNORMAL HIGH (ref 70–99)

## 2021-07-06 LAB — TROPONIN I (HIGH SENSITIVITY)
Troponin I (High Sensitivity): 10 ng/L (ref <18)
Troponin I (High Sensitivity): 10 ng/L (ref <18)

## 2021-07-06 LAB — LACTIC ACID, PLASMA: Lactic Acid, Venous: 1.7 mmol/L (ref 0.5–1.9)

## 2021-07-06 LAB — POTASSIUM: Potassium: 5.1 mmol/L (ref 3.5–5.1)

## 2021-07-06 LAB — PROCALCITONIN: Procalcitonin: <0.1 ng/mL

## 2021-07-06 LAB — T4, FREE: Free T4: 1.1 ng/dL (ref 0.61–1.12)

## 2021-07-06 MED ORDER — SODIUM CHLORIDE 0.9 % IV SOLN: INTRAVENOUS | Status: DC

## 2021-07-06 MED ORDER — ACETAMINOPHEN 650 MG RE SUPP
650.0000 mg | Freq: Four times a day (QID) | RECTAL | Status: DC | PRN
Start: 2021-07-06 — End: 2021-07-14
  Filled 2021-07-06: qty 1

## 2021-07-06 MED ORDER — METOPROLOL TARTRATE 5 MG/5ML IV SOLN
2.5000 mg | Freq: Once | INTRAVENOUS | Status: AC
  Administered 2021-07-06: 2.5 mg via INTRAVENOUS
  Filled 2021-07-06: qty 5

## 2021-07-06 MED ORDER — ALBUTEROL SULFATE (2.5 MG/3ML) 0.083% IN NEBU: 2.5000 mg | INHALATION_SOLUTION | RESPIRATORY_TRACT | Status: DC | PRN

## 2021-07-06 MED ORDER — SODIUM CHLORIDE 0.9 % IV SOLN
500.0000 mg | INTRAVENOUS | Status: DC
  Administered 2021-07-06: 500 mg via INTRAVENOUS
  Filled 2021-07-06: qty 500

## 2021-07-06 MED ORDER — SODIUM CHLORIDE 0.9 % IV SOLN: 1.0000 g | Freq: Once | INTRAVENOUS | Status: DC

## 2021-07-06 MED ORDER — METOPROLOL SUCCINATE ER 50 MG PO TB24
150.0000 mg | ORAL_TABLET | Freq: Every day | ORAL | Status: DC
  Administered 2021-07-08 – 2021-07-11 (×4): 150 mg via ORAL
  Filled 2021-07-06 (×5): qty 1

## 2021-07-06 MED ORDER — LACTATED RINGERS IV SOLN: INTRAVENOUS | Status: DC

## 2021-07-06 MED ORDER — DM-GUAIFENESIN ER 30-600 MG PO TB12: 1.0000 | ORAL_TABLET | Freq: Two times a day (BID) | ORAL | Status: DC | PRN

## 2021-07-06 MED ORDER — IOHEXOL 9 MG/ML PO SOLN
500.0000 mL | Freq: Once | ORAL | Status: DC | PRN
Start: 2021-07-06 — End: 2021-07-14

## 2021-07-06 MED ORDER — SODIUM CHLORIDE 0.9 % IV SOLN
2.0000 g | INTRAVENOUS | Status: DC
  Administered 2021-07-06: 2 g via INTRAVENOUS
  Filled 2021-07-06: qty 20

## 2021-07-06 MED ORDER — ONDANSETRON HCL 4 MG/2ML IJ SOLN: 4.0000 mg | Freq: Three times a day (TID) | INTRAMUSCULAR | Status: DC | PRN

## 2021-07-06 MED ORDER — RIVAROXABAN 20 MG PO TABS
20.0000 mg | ORAL_TABLET | Freq: Every day | ORAL | Status: DC
  Administered 2021-07-07 – 2021-07-13 (×7): 20 mg via ORAL
  Filled 2021-07-06 (×10): qty 1

## 2021-07-06 MED ORDER — ONDANSETRON HCL 4 MG/2ML IJ SOLN: 4.0000 mg | Freq: Once | INTRAMUSCULAR | Status: DC

## 2021-07-06 MED ORDER — VITAMIN D 25 MCG (1000 UNIT) PO TABS
2000.0000 [IU] | ORAL_TABLET | ORAL | Status: DC
  Administered 2021-07-08 – 2021-07-14 (×5): 2000 [IU] via ORAL
  Filled 2021-07-06 (×7): qty 2

## 2021-07-06 MED ORDER — INSULIN ASPART 100 UNIT/ML IJ SOLN
0.0000 [IU] | Freq: Every day | INTRAMUSCULAR | Status: DC
  Administered 2021-07-10: 2 [IU] via SUBCUTANEOUS
  Administered 2021-07-11 – 2021-07-12 (×2): 3 [IU] via SUBCUTANEOUS
  Administered 2021-07-13: 4 [IU] via SUBCUTANEOUS
  Filled 2021-07-06 (×4): qty 1

## 2021-07-06 MED ORDER — MIRABEGRON ER 25 MG PO TB24
25.0000 mg | ORAL_TABLET | Freq: Every day | ORAL | Status: DC
  Administered 2021-07-08 – 2021-07-14 (×7): 25 mg via ORAL
  Filled 2021-07-06 (×9): qty 1

## 2021-07-06 MED ORDER — ATORVASTATIN CALCIUM 20 MG PO TABS
80.0000 mg | ORAL_TABLET | Freq: Every day | ORAL | Status: DC
  Administered 2021-07-07 – 2021-07-13 (×7): 80 mg via ORAL
  Filled 2021-07-06: qty 1
  Filled 2021-07-06: qty 4
  Filled 2021-07-06 (×2): qty 1
  Filled 2021-07-06: qty 4
  Filled 2021-07-06: qty 1
  Filled 2021-07-06: qty 4

## 2021-07-06 MED ORDER — ASPIRIN EC 81 MG PO TBEC
81.0000 mg | DELAYED_RELEASE_TABLET | Freq: Every day | ORAL | Status: DC
  Administered 2021-07-08 – 2021-07-14 (×7): 81 mg via ORAL
  Filled 2021-07-06 (×7): qty 1

## 2021-07-06 MED ORDER — SODIUM CHLORIDE 0.9 % IV BOLUS
1000.0000 mL | Freq: Once | INTRAVENOUS | Status: AC
  Administered 2021-07-06: 1000 mL via INTRAVENOUS

## 2021-07-06 MED ORDER — HYDRALAZINE HCL 20 MG/ML IJ SOLN
5.0000 mg | INTRAMUSCULAR | Status: DC | PRN
  Administered 2021-07-11: 5 mg via INTRAVENOUS
  Filled 2021-07-06: qty 1

## 2021-07-06 MED ORDER — INSULIN ASPART 100 UNIT/ML IJ SOLN
0.0000 [IU] | Freq: Three times a day (TID) | INTRAMUSCULAR | Status: DC
  Administered 2021-07-06 – 2021-07-07 (×3): 5 [IU] via SUBCUTANEOUS
  Administered 2021-07-08: 1 [IU] via SUBCUTANEOUS
  Administered 2021-07-08 (×2): 2 [IU] via SUBCUTANEOUS
  Administered 2021-07-09: 3 [IU] via SUBCUTANEOUS
  Administered 2021-07-09: 1 [IU] via SUBCUTANEOUS
  Administered 2021-07-09: 5 [IU] via SUBCUTANEOUS
  Administered 2021-07-10 (×2): 1 [IU] via SUBCUTANEOUS
  Administered 2021-07-10: 2 [IU] via SUBCUTANEOUS
  Administered 2021-07-11: 7 [IU] via SUBCUTANEOUS
  Administered 2021-07-11: 2 [IU] via SUBCUTANEOUS
  Administered 2021-07-11: 9 [IU] via SUBCUTANEOUS
  Administered 2021-07-12: 2 [IU] via SUBCUTANEOUS
  Administered 2021-07-12 (×2): 7 [IU] via SUBCUTANEOUS
  Administered 2021-07-13 (×2): 2 [IU] via SUBCUTANEOUS
  Administered 2021-07-13: 7 [IU] via SUBCUTANEOUS
  Administered 2021-07-14 (×2): 3 [IU] via SUBCUTANEOUS
  Filled 2021-07-06 (×21): qty 1

## 2021-07-06 MED ORDER — SODIUM CHLORIDE 0.9 % IV SOLN
3.0000 g | Freq: Four times a day (QID) | INTRAVENOUS | Status: AC
  Administered 2021-07-06 – 2021-07-11 (×20): 3 g via INTRAVENOUS
  Filled 2021-07-06: qty 3
  Filled 2021-07-06: qty 8
  Filled 2021-07-06 (×2): qty 3
  Filled 2021-07-06 (×6): qty 8
  Filled 2021-07-06: qty 3
  Filled 2021-07-06 (×4): qty 8
  Filled 2021-07-06: qty 3
  Filled 2021-07-06 (×3): qty 8
  Filled 2021-07-06: qty 3

## 2021-07-06 NOTE — ED Notes (Signed)
Dr. Veronese at the bedside ?

## 2021-07-06 NOTE — ED Notes (Signed)
Bair hugger removed from pt at this time.  

## 2021-07-06 NOTE — ED Triage Notes (Signed)
Patient arrived via EMS from [facility]. Per EMS, they were originally called out for an emergent medical call. They arrived at the facility and were told that the pt vomited once. They got the patient in the truck and noticed that she was not responsive verbally, which it's unknown if that's her baseline or not. On arrival, pt does not respond verbally, but she pulls back when her arm or leg is touched. All VS provided by EMS are stable.

## 2021-07-06 NOTE — ED Provider Notes (Signed)
Patient received in signout from Dr. Don Perking pending blood work and reassessment.  Patient with persistent hypothermia.  Will cover with antibiotics due to concern for possible aspiration though she is nonhypoxic.  Borderline elevated potassium level though possibly hemolysis or may be related to hypothermia. Will discuss with hospitalist for admission.   Willy Eddy, MD 07/06/21 [phone]

## 2021-07-06 NOTE — ED Notes (Signed)
Bair hugger placed on patient.  

## 2021-07-06 NOTE — ED Notes (Signed)
Secure chat sent to Dr. Clyde Lundborg regarding possible need to change level of care and pt becoming tachycardic up to 110-115. Awaiting response.

## 2021-07-06 NOTE — H&P (Signed)
History and Physical    Leah Ritter HCW:[id number] DOB: 1951/06/04 DOA: 07/06/2021  Referring MD/NP/PA:   PCP: Mortimer Fries, PA   Patient coming from:  The patient is coming from SNF.     Chief Complaint: AMS, nausea, vomiting, possible aspiration  HPI: Leah Ritter is a 70 y.o. female with medical history significant of hypertension, hyperlipidemia, diabetes mellitus, stroke, depression, anxiety, OSA not on CPAP, CHF, atrial fibrillation on Xarelto, CKD-3B, who presents with altered mental status, nausea, vomiting, possible aspiration.  Per patient's daughter (I called her daughter by phone), at normal baseline, patient is talking, but usually does not make sense and difficult to be understood.  Patient recognize her daughter, but has not orientated to the time.  Per report, patient was found to worse mental status and less responsive in facility.  Patient was noted to have nausea and vomited few times.  They are concerned that patient was aspirated. When I saw pt in ED, pt is barely arousable, not following command, she moves all extremities, no facial droop noted.  Patient has some cough, no respiratory distress, not sure if patient has any chest pain or abdominal pain.  Patient was found to have hypothermia with temperature 92, Bair hugger is started in ED.   ED Course: pt was found to have WBC 11.1, lactic acid of 1.7, negative COVID PCR, lactic acid 1.7, troponin level 10 --> 10, BNP 21.4, stable renal function, potassium 5.1, blood pressure 176/56, heart rate 77, RR 19, oxygen saturation 98-100% on room air.  Chest x-ray showed cardiomegaly and vascular congestion.  CT of abdomen/pelvis is negative for acute issues, but showed gallstones without evidence of cholecystitis.  CT of head is negative for acute intracranial abnormalities.  Patient is admitted to MedSurg bed as inpatient.   Review of Systems: Could not be reviewed due to altered mental status.   Allergy:  Allergies   Allergen Reactions   Oxycodone-Acetaminophen Nausea And Vomiting    Other reaction(s): Vomiting   Ezetimibe Other (See Comments)    Reaction unknown   Lisinopril Other (See Comments)    Reaction unknown   Statins Other (See Comments)    Reaction unknown     Past Medical History:  Diagnosis Date   A-fib (HCC)    Anxiety    CHF (congestive heart failure) (HCC)    Diabetes (HCC)    GERD (gastroesophageal reflux disease)    Hypertension    Sleep apnea    Stroke [facility])     Past Surgical History:  Procedure Laterality Date   PARTIAL HYSTERECTOMY     tubligation      Social History:  reports that she has never smoked. She has never used smokeless tobacco. She reports that she does not currently use alcohol. She reports that she does not use drugs.  Family History:  Family History  Problem Relation Age of Onset   Breast cancer Mother    Stroke Father    Hypertension Father    Kidney disease Brother    Cancer Brother      Prior to Admission medications   Medication Sig Start Date End Date Taking? Authorizing Provider  ALPRAZolam (XANAX) 0.25 MG tablet Take 0.25 mg by mouth 2 (two) times daily as needed for anxiety. 04/27/21   [provider]  aspirin EC 81 MG tablet Take 81 mg by mouth daily.    [provider]  atorvastatin (LIPITOR) 80 MG tablet 80 mg daily at 6 PM.     [provider]  CONTOUR NEXT TEST test strip CHECK BLOOD SUGAR TID UTD 11/14/17   [provider]  donepezil (ARICEPT) 5 MG tablet Take 5 mg by mouth at bedtime.    [provider]  furosemide (LASIX) 40 MG tablet Take 1 tablet (40 mg total) by mouth daily as needed for fluid or edema. 05/08/21   Arnetha Courser, MD  glipiZIDE (GLUCOTROL) 10 MG tablet Take 10 mg by mouth 2 (two) times daily before a meal.     [provider]  metoprolol succinate (TOPROL-XL) 100 MG 24 hr tablet Take 100 mg by mouth daily. 09/14/18   [provider]  metoprolol  succinate (TOPROL-XL) 50 MG 24 hr tablet Take 50 mg by mouth daily. 11/24/18   [provider]  mirtazapine (REMERON SOL-TAB) 15 MG disintegrating tablet Take 1 tablet (15 mg total) by mouth at bedtime. 05/08/21   Arnetha Courser, MD  MYRBETRIQ 25 MG TB24 tablet Take 25 mg by mouth daily. 04/29/21   [provider]  QUEtiapine (SEROQUEL) 50 MG tablet Take 50 mg by mouth at bedtime. 04/29/21   [provider]  spironolactone (ALDACTONE) 25 MG tablet Take 25 mg by mouth daily. 04/29/21   [provider]  Vitamin D, Ergocalciferol, 2000 units CAPS Take by mouth.    [provider]  XARELTO 20 MG TABS tablet Take 20 mg by mouth daily. 04/29/21   [provider]    Physical Exam: Vitals:   07/06/21 1600 07/06/21 1647 07/06/21 1720 07/06/21 1822  BP: (!) 164/64 (!) 166/74 (!) 145/82 (!) 144/80  Pulse:  (!) 113 (!) 112 (!) 109  Resp: 20 17 16 16   Temp:  97.8 F (36.6 C) 98.1 F (36.7 C) 98.1 F (36.7 C)  TempSrc:   Axillary   SpO2: 97% 98% 98% 92%  Weight:      Height:       General: Not in acute distress HEENT:       Eyes: PERRL, EOMI, no scleral icterus.       ENT: No discharge from the ears and nose       Neck: No JVD, no bruit, no mass felt. Heme: No neck lymph node enlargement. Cardiac: S1/S2, RRR, No murmurs, No gallops or rubs. Respiratory: Has coarse breathing sound bilaterally GI: Soft, nondistended, nontender, no organomegaly, BS present. GU: No hematuria Ext: No pitting leg edema bilaterally. 1+DP/PT pulse bilaterally. Musculoskeletal: No joint deformities, No joint redness or warmth, no limitation of ROM in spin. Skin: No rashes.  Neuro: Confused, barely arousable, not oriented X3, cranial nerves II-XII grossly intact, moves all extremities.  Psych: Patient is not psychotic  Labs on Admission: I have personally reviewed following labs and imaging studies  CBC: Recent Labs  Lab 07/06/21 0609  WBC 11.1*  NEUTROABS 9.9*  HGB  11.5*  HCT 33.9*  MCV 87.8  PLT 256   Basic Metabolic Panel: Recent Labs  Lab 07/06/21 0609 07/06/21 0746 07/06/21 1223  NA 132* 133*  --   K 5.9* 5.6* 5.1  CL 96* 101  --   CO2 25 24  --   GLUCOSE 291* 273*  --   BUN 30* 29*  --   CREATININE 1.17* 1.12*  --   CALCIUM 9.7 8.7*  --    GFR: Estimated Creatinine Clearance: 55.6 mL/min (A) (by C-G formula based on SCr of 1.12 mg/dL (H)). Liver Function Tests: Recent Labs  Lab 07/06/21 0609  AST 28  ALT 32  ALKPHOS  112  BILITOT 1.0  PROT 8.2*  ALBUMIN 3.3*   Recent Labs  Lab 07/06/21 0746  LIPASE 42   No results for input(s): AMMONIA in the last 168 hours. Coagulation Profile: No results for input(s): INR, PROTIME in the last 168 hours. Cardiac Enzymes: No results for input(s): CKTOTAL, CKMB, CKMBINDEX, TROPONINI in the last 168 hours. BNP (last 3 results) No results for input(s): PROBNP in the last 8760 hours. HbA1C: No results for input(s): HGBA1C in the last 72 hours. CBG: Recent Labs  Lab 07/06/21 1146 07/06/21 1652  GLUCAP 251* 195*   Lipid Profile: No results for input(s): CHOL, HDL, LDLCALC, TRIG, CHOLHDL, LDLDIRECT in the last 72 hours. Thyroid Function Tests: Recent Labs    07/06/21 1223  FREET4 1.10   Anemia Panel: No results for input(s): VITAMINB12, FOLATE, FERRITIN, TIBC, IRON, RETICCTPCT in the last 72 hours. Urine analysis:    Component Value Date/Time   COLORURINE YELLOW (A) 07/06/2021 0609   APPEARANCEUR HAZY (A) 07/06/2021 0609   LABSPEC 1.025 07/06/2021 0609   PHURINE  07/06/2021 0609    TEST NOT REPORTED DUE TO COLOR INTERFERENCE OF URINE PIGMENT   GLUCOSEU (A) 07/06/2021 0609    TEST NOT REPORTED DUE TO COLOR INTERFERENCE OF URINE PIGMENT   HGBUR (A) 07/06/2021 0609    TEST NOT REPORTED DUE TO COLOR INTERFERENCE OF URINE PIGMENT   BILIRUBINUR (A) 07/06/2021 0609    TEST NOT REPORTED DUE TO COLOR INTERFERENCE OF URINE PIGMENT   KETONESUR (A) 07/06/2021 0609    TEST NOT  REPORTED DUE TO COLOR INTERFERENCE OF URINE PIGMENT   PROTEINUR (A) 07/06/2021 0609    TEST NOT REPORTED DUE TO COLOR INTERFERENCE OF URINE PIGMENT   NITRITE (A) 07/06/2021 0609    TEST NOT REPORTED DUE TO COLOR INTERFERENCE OF URINE PIGMENT   LEUKOCYTESUR (A) 07/06/2021 0609    TEST NOT REPORTED DUE TO COLOR INTERFERENCE OF URINE PIGMENT   Sepsis Labs: @LABRCNTIP (procalcitonin:4,lacticidven:4) ) Recent Results (from the past 240 hour(s))  Resp Panel by RT-PCR (Flu A&B, Covid) Nasopharyngeal Swab     Status: None   Collection Time: 07/06/21  6:09 AM   Specimen: Nasopharyngeal Swab; Nasopharyngeal(NP) swabs in vial transport medium  Result Value Ref Range Status   SARS Coronavirus 2 by RT PCR NEGATIVE NEGATIVE Final    Comment: (NOTE) SARS-CoV-2 target nucleic acids are NOT DETECTED.  The SARS-CoV-2 RNA is generally detectable in upper respiratory specimens during the acute phase of infection. The lowest concentration of SARS-CoV-2 viral copies this assay can detect is 138 copies/mL. A negative result does not preclude SARS-Cov-2 infection and should not be used as the sole basis for treatment or other patient management decisions. A negative result may occur with  improper specimen collection/handling, submission of specimen other than nasopharyngeal swab, presence of viral mutation(s) within the areas targeted by this assay, and inadequate number of viral copies(<138 copies/mL). A negative result must be combined with clinical observations, patient history, and epidemiological information. The expected result is Negative.  Fact Sheet for Patients:  09/05/21  Fact Sheet for Healthcare Providers:  BloggerCourse.com  This test is no t yet approved or cleared by the [place] FDA and  has been authorized for detection and/or diagnosis of SARS-CoV-2 by FDA under an Emergency Use Authorization (EUA). This EUA will remain   in effect (meaning this test can be used) for the duration of the COVID-19 declaration under Section 564(b)(1) of the Act, 21 U.S.C.section 360bbb-3(b)(1), unless the authorization is  terminated  or revoked sooner.       Influenza A by PCR NEGATIVE NEGATIVE Final   Influenza B by PCR NEGATIVE NEGATIVE Final    Comment: (NOTE) The Xpert Xpress SARS-CoV-2/FLU/RSV plus assay is intended as an aid in the diagnosis of influenza from Nasopharyngeal swab specimens and should not be used as a sole basis for treatment. Nasal washings and aspirates are unacceptable for Xpert Xpress SARS-CoV-2/FLU/RSV testing.  Fact Sheet for Patients: BloggerCourse.com  Fact Sheet for Healthcare Providers: Serious[employer]ker.it  This test is not yet approved or cleared by the [place] FDA and has been authorized for detection and/or diagnosis of SARS-CoV-2 by FDA under an Emergency Use Authorization (EUA). This EUA will remain in effect (meaning this test can be used) for the duration of the COVID-19 declaration under Section 564(b)(1) of the Act, 21 U.S.C. section 360bbb-3(b)(1), unless the authorization is terminated or revoked.  Performed at [facility], [address] [place], [place] [address]      Radiological Exams on Admission: CT ABDOMEN PELVIS WO CONTRAST  Result Date: 07/06/2021 CLINICAL DATA:  Nausea, vomiting EXAM: CT ABDOMEN AND PELVIS WITHOUT CONTRAST TECHNIQUE: Multidetector CT imaging of the abdomen and pelvis was performed following the standard protocol without IV contrast. COMPARISON:  None. FINDINGS: Lower chest: Coronary artery and aortic calcifications. Bibasilar atelectasis. Hepatobiliary: Layering gallstones within the gallbladder. No biliary ductal dilatation or visible ductal stones. No focal hepatic abnormality. Pancreas: No focal abnormality or ductal dilatation. Spleen: No focal abnormality.  Normal size.  Adrenals/Urinary Tract: No adrenal abnormality. No focal renal abnormality. No stones or hydronephrosis. Urinary bladder is unremarkable. Stomach/Bowel: Normal appendix. Scattered colonic diverticulosis. No active diverticulitis. Stomach and small bowel decompressed. Vascular/Lymphatic: Scattered aortic atherosclerosis. No evidence of aneurysm or adenopathy. Reproductive: Prior hysterectomy.  No adnexal masses. Other: No free fluid or free air. Musculoskeletal: No acute bony abnormality. IMPRESSION: Cholelithiasis.  No CT evidence of acute cholecystitis. Bibasilar atelectasis. Coronary artery disease, aortic atherosclerosis. Scattered colonic diverticulosis. No acute findings in the abdomen or pelvis. Electronically Signed   By: Charlett Nose M.D.   On: 07/06/2021 12:51   CT HEAD WO CONTRAST ( )  Result Date: 07/06/2021 CLINICAL DATA:  Altered mental status. EXAM: CT HEAD WITHOUT CONTRAST TECHNIQUE: Contiguous axial images were obtained from the base of the skull through the vertex without intravenous contrast. COMPARISON:  05/05/2021 FINDINGS: Brain: There is no evidence of an acute infarct, intracranial hemorrhage, mass, midline shift, or extra-axial fluid collection. A small to moderate-sized chronic infarct is again noted in the posterior right frontal lobe. Hypodensities elsewhere in the cerebral white matter bilaterally are unchanged and nonspecific but compatible with mild chronic small vessel ischemic disease. Mild cerebral atrophy is unchanged. Vascular: Calcified atherosclerosis at the skull base. No hyperdense vessel. Skull: No acute fracture or suspicious osseous lesion. Sinuses/Orbits: Mucosal thickening and large volume fluid in the left maxillary sinus. Mild mucosal thickening in the other left-sided paranasal sinuses. Clear mastoid air cells. Unremarkable orbits. Other: None. IMPRESSION: 1. No evidence of acute intracranial abnormality. 2. Chronic ischemia including a chronic right frontal  infarct. 3. Left maxillary sinusitis. Electronically Signed   By: Sebastian Ache M.D.   On: 07/06/2021 06:45   DG Chest Portable 1 View  Result Date: 07/06/2021 CLINICAL DATA:  Altered mental status.  Vomiting. EXAM: PORTABLE CHEST 1 VIEW COMPARISON:  05/05/2021 FINDINGS: The cardiac silhouette remains enlarged. Lung volumes remain low with similar appearance of pulmonary vascular congestion and stable to slightly increased interstitial prominence.  There may be trace pleural effusions. No pneumothorax is identified. No acute osseous abnormality is seen. IMPRESSION: Cardiomegaly with unchanged to slightly increased pulmonary vascular congestion/mild edema. Electronically Signed   By: Sebastian Ache M.D.   On: 07/06/2021 06:29     EKG: I have personally reviewed.  Sinus rhythm, QTC 465, heart rate 65, poor R wave progression, low voltage   Assessment/Plan Principal Problem:   Acute metabolic encephalopathy Active Problems:   HLD (hyperlipidemia)   Hypothermia   Type II diabetes mellitus with renal manifestations (HCC)   Stroke (HCC)   Hypertension   A-fib (HCC)   Chronic diastolic CHF (congestive heart failure) (HCC)   Depression with anxiety   CKD (chronic kidney disease), stage IIIb   Aspiration pneumonia (HCC)   Nausea & vomiting   Acute metabolic encephalopathy: Etiology is not clear.  CT head is negative for acute intracranial abnormalities.  Polypharmacy is a differential diagnosis. -Admit to progressive bed as inpatient -Hold sedative medications, including Xanax, Remeron, Seroquel -Frequent neurochecks  Possible spiration pneumonia: Patient has mild leukocytosis, lactic acid is normal.  Does not meet criteria for sepsis.  Oxygen saturation normal. -Unasyn (patient received 1 dose of Rocephin and azithromycin in ED) -Bronchodilators -Sputum culture, blood culture  HLD (hyperlipidemia) -Lipitor  Hypothermia: Initial body temperature 92, improved to 96 on Bair hugger -On Bair  hugger  Type II diabetes mellitus with renal manifestations (HCC): Recent A1c 10.5, poorly controlled.  Patient is taking glipizide -Sliding scale insulin  History of stroke (HCC) -Aspirin, Lipitor  Hypertension -IV hydralazine as needed -Metoprolol  A-fib (HCC) -Xarelto, metoprolol  Chronic diastolic CHF (congestive heart failure) (HCC): 2D echo on 05/07/2021 showed EF > 75% with grade 1 diastolic dysfunction.  Patient does not have leg edema, no DVT.  Normal BNP 21.4.  Does not seem to have CHF exacerbation -Hold Lasix, spironolactone since patient is at risk of developing sepsis  Depression with anxiety -Hold Xanax, Remeron, Seroquel due to concern for polypharmacy  CKD (chronic kidney disease), stage IIIb: Stable -Follow-up of BMP  Nausea & vomiting: CT of abdomen/pelvis is negative for acute intra-abdominal issues.  Lipase normal 42. -Supportive care -as needed Zofran    DVT ppx: on Xaretlo Code Status: DNR (I discussed with patient's daughter,  and explained the meaning of CODE STATUS.  Her daughter is very supportive and realistic.  Her daughter wants patient to be DNR.  Family Communication: Yes, patient's daughter by phone Disposition Plan:  Anticipate discharge back to previous environment Consults called:  none Admission status and Level of care: Med-Surg:   as inpt      Status is: Inpatient  Remains inpatient appropriate because:Inpatient level of care appropriate due to severity of illness  Dispo: The patient is from: SNF              Anticipated d/c is to: SNF              Patient currently is not medically stable to d/c.   Difficult to place patient No         Date of Service 07/06/2021    Lorretta Harp [facility]   If 7PM-7AM, please contact night-coverage [email or web address] 07/06/2021, 6:31 PM

## 2021-07-06 NOTE — Consult Note (Addendum)
Pharmacy Antibiotic Note  Leah Ritter is a 70 y.o. female with PMH of HTN, HLD, DM, stroke, depression/anxiety, OSA, CHF, Afib on Xarelto, CKD-3B who was admitted on 07/06/2021 with  aspiration pneumonia .  Pharmacy has been consulted for Unasyn dosing.  Afeb since admin, WBC 11.1, PCT <0.10  Bcx and sputum cultures sent  Pt noted to have received 1 dose of azithromycin and ceftriaxone on admission  Plan: Unasyn -Will start Unasyn 3g q6h  Will continue to monitor renal function and adjust dose as clinically indicated   Height: 5\' 10"  (177.8 cm) Weight: 88.5 kg (195 lb) IBW/kg (Calculated) : 68.5  Temp (24hrs), Avg:92 F (33.3 C), Min:91.9 F (33.3 C), Max:92 F (33.3 C)  Recent Labs  Lab 07/06/21 0609 07/06/21 0746  WBC 11.1*  --   CREATININE 1.17* 1.12*  LATICACIDVEN 1.7  --     Estimated Creatinine Clearance: 55.6 mL/min (A) (by C-G formula based on SCr of 1.12 mg/dL (H)).    Allergies  Allergen Reactions   Oxycodone-Acetaminophen Nausea And Vomiting    Other reaction(s): Vomiting   Ezetimibe Other (See Comments)    Reaction unknown   Lisinopril Other (See Comments)    Reaction unknown   Statins Other (See Comments)    Reaction unknown     Antimicrobials this admission: Ongoing Unasyn 10/10 >>  Completed 10/10 Ceftriaxone x1 10/10 Azithromycin x1  Microbiology results: 10/10 BCx: sent  10/10 Sputum: sent   Thank you for allowing pharmacy to be a part of this patient's care.  09/05/21, PharmD Pharmacy Resident  07/06/2021 11:52 AM

## 2021-07-06 NOTE — ED Provider Notes (Signed)
[facility] Emergency Department Provider Note  ____________________________________________  Time seen: Approximately 6:00 AM  I have reviewed the triage vital signs and the nursing notes.   HISTORY  Chief Complaint Altered Mental Status  Level 5 caveat:  Portions of the history and physical were unable to be obtained due to AMS   HPI Leah Ritter is a 70 y.o. female with a history of dementia, diabetes, hypertension, stroke, sleep apnea, A. fib on Xarelto who presents from [facility] dementia unit for vomiting.  According to EMS, patient had 1 episode of vomiting this evening.  She was less responsive than baseline.  Is unclear what patient's baseline is.  On arrival to the emergency room she is nonverbal, forcefully keeping her eyes closed, localizes to pain x4, face is symmetric.  Patient does not provide any history.  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/06/2021   Stroke (HCC)    Hypertension    A-fib (HCC)    Chronic diastolic CHF (congestive heart failure) (HCC)    Depression with anxiety    CKD (chronic kidney disease), stage IIIb    Aspiration pneumonia (HCC)    Nausea & vomiting    AKI (acute kidney injury) (HCC)    Hyperglycemia    AMS (altered mental status) 05/05/2021   SIRS (systemic inflammatory response syndrome) (HCC) 01/09/2019   Acute on chronic systolic CHF (congestive heart failure) (HCC) 01/09/2019   Pain in joint involving ankle and foot 01/03/2018   Localized,  primary osteoarthritis of shoulder region 01/03/2018   Rupture of tendon of biceps, long head 01/03/2018   Disorder of bursae of shoulder region 01/03/2018   Screening declined by patient 10/25/2017   Full thickness rotator cuff tear 04/21/2016   Chronic bilateral low back pain without sciatica 02/10/2016   H/O rotator cuff tear 02/10/2016   History of vitamin D deficiency 02/10/2016   Type 2 diabetes mellitus without complication, with long-term current use of insulin (HCC) 02/10/2016   Diabetes (HCC) 11/24/2006   HLD (hyperlipidemia) 11/24/2006   OBESITY, NOS 11/24/2006   POST TRAUMATIC STRESS DISORDER 11/24/2006   DEPRESSIVE DISORDER, NOS 11/24/2006   HYPERTENSION, BENIGN SYSTEMIC 11/24/2006   RHINITIS, ALLERGIC 11/24/2006   GERD (gastroesophageal reflux disease) 11/24/2006   OSTEOARTHRITIS, MULTI SITES 11/24/2006   APNEA, SLEEP 11/24/2006    Past Surgical History:  Procedure Laterality Date   PARTIAL HYSTERECTOMY     tubligation      Prior to Admission medications   Medication Sig Start Date End Date Taking? Authorizing Provider  ALPRAZolam (XANAX) 0.25 MG tablet Take 0.25 mg by mouth 2 (two) times daily. 04/27/21  Yes [provider]  aspirin EC 81 MG tablet Take 81 mg by mouth daily.   Yes [provider]  atorvastatin (LIPITOR) 80 MG tablet Take 80 mg by mouth daily at 6 PM.   Yes [provider]  furosemide (LASIX) 40 MG tablet Take 1 tablet (40 mg  total) by mouth daily as needed for fluid or edema. 05/08/21  Yes Arnetha Courser, MD  glipiZIDE (GLUCOTROL) 10 MG tablet Take 10 mg by mouth 2 (two) times daily before a meal.    Yes [provider]  metoprolol succinate (TOPROL-XL) 50 MG 24 hr tablet Take 150 mg by mouth daily. 11/24/18  Yes [provider]  mirtazapine (REMERON SOL-TAB) 15 MG disintegrating tablet Take 1 tablet (15 mg total) by mouth at bedtime. 05/08/21  Yes Arnetha Courser, MD  MYRBETRIQ 25 MG TB24 tablet Take 25 mg by  mouth daily. 04/29/21  Yes [provider]  QUEtiapine (SEROQUEL) 50 MG tablet Take 50 mg by mouth at bedtime. 04/29/21  Yes [provider]  spironolactone (ALDACTONE) 25 MG tablet Take 25 mg by mouth daily. 04/29/21  Yes [provider]  Vitamin D, Ergocalciferol, 2000 units CAPS Take 2,000 Units by mouth every morning.   Yes [provider]  XARELTO 20 MG TABS tablet Take 20 mg by mouth daily. 04/29/21  Yes [provider]  ALPRAZolam Prudy Feeler) 0.25 MG tablet Take 0.25 mg by mouth 2 (two) times daily as needed (for agitation).    [provider]  CONTOUR NEXT TEST test strip 1 each by Other route every evening. 11/14/17   [provider]  ibuprofen (ADVIL) 600 MG tablet Take 600 mg by mouth every 6 (six) hours as needed (for pain).    [provider]    Allergies Oxycodone-acetaminophen, Ezetimibe, Lisinopril, and Statins  Family History  Problem Relation Age of Onset   Breast cancer Mother    Stroke Father    Hypertension Father    Kidney disease Brother    Cancer Brother     Social History Social History   Tobacco Use   Smoking status: Never   Smokeless tobacco: Never  Vaping Use   Vaping Use: Never used  Substance Use Topics   Alcohol use: Not Currently   Drug use: Never    Review of Systems  Constitutional: Negative for fever. Respiratory: Negative for cough or difficulty breathing Gastrointestinal: Negative for diarrhea. + vomiting  ____________________________________________   PHYSICAL EXAM:  VITAL SIGNS: ED Triage Vitals [07/06/21 0551]  Enc Vitals Group     BP      Pulse      Resp      Temp      Temp src      SpO2 98 %     Weight      Height      Head Circumference      Peak Flow      Pain Score      Pain Loc      Pain Edu?      Excl. in GC?     Constitutional: Eyes closed, won't allow [place] to open it without a fight, localizes to pain x 4, non verbal HEENT:      Head:  Normocephalic and atraumatic.         Eyes: Conjunctivae are normal. Sclera is non-icteric. PERRL      Mouth/Throat: Mucous membranes are dry.       Neck: Supple with no signs of meningismus. Cardiovascular: Regular rate and rhythm. No murmurs, gallops, or rubs. 2+ symmetrical distal pulses are present in all extremities. No JVD. Respiratory: Normal respiratory effort. Lungs are clear to auscultation bilaterally.  Gastrointestinal: Soft, non tender, and non distended with positive bowel sounds. No rebound or guarding. Musculoskeletal:  No edema, cyanosis, or erythema  of extremities. Neurologic: Normal speech and language. Face is symmetric. Moving all extremities. No gross focal neurologic deficits are appreciated. Skin: Skin is warm, dry and intact. No rash noted.   ____________________________________________   LABS (all labs ordered are listed, but only abnormal results are displayed)  Labs Reviewed  CBC WITH DIFFERENTIAL/PLATELET - Abnormal; Notable for the following components:      Result Value   WBC 11.1 (*)    RBC 3.86 (*)    Hemoglobin 11.5 (*)    HCT 33.9 (*)    Neutro Abs 9.9 (*)    All other components within normal limits  COMPREHENSIVE METABOLIC PANEL - Abnormal; Notable for the following components:   Sodium 132 (*)    Potassium 5.9 (*)    Chloride 96 (*)    Glucose, Bld 291 (*)    BUN 30 (*)    Creatinine, Ser 1.17 (*)    Total Protein 8.2 (*)    Albumin 3.3 (*)    GFR, Estimated 50 (*)    All other components within normal limits  URINALYSIS, COMPLETE (UACMP) WITH MICROSCOPIC - Abnormal; Notable for the following components:   Color, Urine YELLOW (*)    APPearance HAZY (*)    Glucose, UA   (*)    Value: TEST NOT REPORTED DUE TO COLOR INTERFERENCE OF URINE PIGMENT   Hgb urine dipstick   (*)    Value: TEST NOT REPORTED DUE TO COLOR INTERFERENCE OF URINE PIGMENT   Bilirubin Urine   (*)    Value: TEST NOT REPORTED DUE TO COLOR INTERFERENCE OF URINE PIGMENT    Ketones, ur   (*)    Value: TEST NOT REPORTED DUE TO COLOR INTERFERENCE OF URINE PIGMENT   Protein, ur   (*)    Value: TEST NOT REPORTED DUE TO COLOR INTERFERENCE OF URINE PIGMENT   Nitrite   (*)    Value: TEST NOT REPORTED DUE TO COLOR INTERFERENCE OF URINE PIGMENT   Leukocytes,Ua   (*)    Value: TEST NOT REPORTED DUE TO COLOR INTERFERENCE OF URINE PIGMENT   All other components within normal limits  BASIC METABOLIC PANEL - Abnormal; Notable for the following components:   Sodium 133 (*)    Potassium 5.6 (*)    Glucose, Bld 273 (*)    BUN 29 (*)    Creatinine, Ser 1.12 (*)    Calcium 8.7 (*)    GFR, Estimated 53 (*)    All other components within normal limits  GLUCOSE, CAPILLARY - Abnormal; Notable for the following components:   Glucose-Capillary 195 (*)    All other components within normal limits  GLUCOSE, CAPILLARY - Abnormal; Notable for the following components:   Glucose-Capillary 196 (*)    All other components within normal limits  CBG MONITORING, ED - Abnormal; Notable for the following components:   Glucose-Capillary 251 (*)    All other components within normal limits  RESP PANEL BY RT-PCR (FLU A&B, COVID) ARPGX2  CULTURE, BLOOD (ROUTINE X 2)  CULTURE, BLOOD (ROUTINE X 2)  EXPECTORATED SPUTUM ASSESSMENT W GRAM STAIN, RFLX TO RESP C  LACTIC ACID, PLASMA  PROCALCITONIN  BRAIN NATRIURETIC PEPTIDE  T4, FREE  CORTISOL  POTASSIUM  LIPASE, BLOOD  PROCALCITONIN  BASIC METABOLIC PANEL  CBC  TROPONIN I (HIGH SENSITIVITY)  TROPONIN I (HIGH SENSITIVITY)   ____________________________________________  EKG   ED ECG REPORT I, Nita Sickle, the attending physician, personally viewed and interpreted this ECG.  Sinus rhythm, rate 65, no  STE or depression ____________________________________________  RADIOLOGY  I have personally reviewed the images performed during this visit and I agree with the Radiologist's read.   Interpretation by Radiologist:  CT  ABDOMEN PELVIS WO CONTRAST  Result Date: 07/06/2021 CLINICAL DATA:  Nausea, vomiting EXAM: CT ABDOMEN AND PELVIS WITHOUT CONTRAST TECHNIQUE: Multidetector CT imaging of the abdomen and pelvis was performed following the standard protocol without IV contrast. COMPARISON:  None. FINDINGS: Lower chest: Coronary artery and aortic calcifications. Bibasilar atelectasis. Hepatobiliary: Layering gallstones within the gallbladder. No biliary ductal dilatation or visible ductal stones. No focal hepatic abnormality. Pancreas: No focal abnormality or ductal dilatation. Spleen: No focal abnormality.  Normal size. Adrenals/Urinary Tract: No adrenal abnormality. No focal renal abnormality. No stones or hydronephrosis. Urinary bladder is unremarkable. Stomach/Bowel: Normal appendix. Scattered colonic diverticulosis. No active diverticulitis. Stomach and small bowel decompressed. Vascular/Lymphatic: Scattered aortic atherosclerosis. No evidence of aneurysm or adenopathy. Reproductive: Prior hysterectomy.  No adnexal masses. Other: No free fluid or free air. Musculoskeletal: No acute bony abnormality. IMPRESSION: Cholelithiasis.  No CT evidence of acute cholecystitis. Bibasilar atelectasis. Coronary artery disease, aortic atherosclerosis. Scattered colonic diverticulosis. No acute findings in the abdomen or pelvis. Electronically Signed   By: Charlett Nose M.D.   On: 07/06/2021 12:51   CT HEAD WO CONTRAST ( )  Result Date: 07/06/2021 CLINICAL DATA:  Altered mental status. EXAM: CT HEAD WITHOUT CONTRAST TECHNIQUE: Contiguous axial images were obtained from the base of the skull through the vertex without intravenous contrast. COMPARISON:  05/05/2021 FINDINGS: Brain: There is no evidence of an acute infarct, intracranial hemorrhage, mass, midline shift, or extra-axial fluid collection. A small to moderate-sized chronic infarct is again noted in the posterior right frontal lobe. Hypodensities elsewhere in the cerebral white  matter bilaterally are unchanged and nonspecific but compatible with mild chronic small vessel ischemic disease. Mild cerebral atrophy is unchanged. Vascular: Calcified atherosclerosis at the skull base. No hyperdense vessel. Skull: No acute fracture or suspicious osseous lesion. Sinuses/Orbits: Mucosal thickening and large volume fluid in the left maxillary sinus. Mild mucosal thickening in the other left-sided paranasal sinuses. Clear mastoid air cells. Unremarkable orbits. Other: None. IMPRESSION: 1. No evidence of acute intracranial abnormality. 2. Chronic ischemia including a chronic right frontal infarct. 3. Left maxillary sinusitis. Electronically Signed   By: Sebastian Ache M.D.   On: 07/06/2021 06:45   DG Chest Portable 1 View  Result Date: 07/06/2021 CLINICAL DATA:  Altered mental status.  Vomiting. EXAM: PORTABLE CHEST 1 VIEW COMPARISON:  05/05/2021 FINDINGS: The cardiac silhouette remains enlarged. Lung volumes remain low with similar appearance of pulmonary vascular congestion and stable to slightly increased interstitial prominence. There may be trace pleural effusions. No pneumothorax is identified. No acute osseous abnormality is seen. IMPRESSION: Cardiomegaly with unchanged to slightly increased pulmonary vascular congestion/mild edema. Electronically Signed   By: Sebastian Ache M.D.   On: 07/06/2021 06:29     ____________________________________________   PROCEDURES  Procedure(s) performed:  .1-3 Lead EKG Interpretation Performed by: Nita Sickle, MD Authorized by: Nita Sickle, MD     Interpretation: non-specific     ECG rate assessment: normal     Rhythm: sinus rhythm     Ectopy: none     Conduction: normal     Critical Care performed:  None ____________________________________________   INITIAL IMPRESSION / ASSESSMENT AND PLAN / ED COURSE  70 y.o. female with a history of dementia, diabetes, hypertension, stroke, sleep apnea, A. fib on Xarelto who presents  from [facility] dementia unit  for vomiting and altered mental status.  Patient arrives to the emergency room nonverbal, forcefully keeping her eyes closed, localize to pain x4, not following commands.  Unclear what patient's baseline is.  Per paramedic who has responded to the facility before patient seems to be at baseline.  Facility is unable to give me patient's baseline since at this time the only person there is the nighttime people were not very familiar with the patient.  She has no signs of trauma.  Her abdomen is soft and nontender, she has normal work of breathing and sats.  She looks dry on exam.  She is very cool to the touch with a rectal temperature of 91.18F.  Bear hugger applied.   Ddx is broad and includes sepsis versus dehydration versus AKI versus electrolyte derangements versus intracranial bleed versus stroke  Will get CT head, chest x-ray, basic labs, COVID swab.  We will give Zofran since patient has a history of aspiration pneumonia so she will vomit again.  Patient placed on telemetry for close monitoring of cardiorespiratory status.  Old medical records reviewed including patient's last admission to the hospital 2 months ago for similar presentation.  Care transferred to Dr. Roxan Hockey at [facility]    _____________________________________________ Please note:  Patient was evaluated in Emergency Department today for the symptoms described in the history of present illness. Patient was evaluated in the context of the global COVID-19 pandemic, which necessitated consideration that the patient might be at risk for infection with the SARS-CoV-2 virus that causes COVID-19. Institutional protocols and algorithms that pertain to the evaluation of patients at risk for COVID-19 are in a state of rapid change based on information released by regulatory bodies including the CDC and federal and state organizations. These policies and algorithms were followed during the patient's care in the ED.  Some  ED evaluations and interventions may be delayed as a result of limited staffing during the pandemic.   [place] Controlled Substance Database was reviewed by me. ____________________________________________   FINAL CLINICAL IMPRESSION(S) / ED DIAGNOSES   Final diagnoses:  Hypothermia, initial encounter      NEW MEDICATIONS STARTED DURING THIS VISIT:  ED Discharge Orders     None        Note:  This document was prepared using Dragon voice recognition software and may include unintentional dictation errors.    Nita Sickle, MD 07/06/21 [phone]

## 2021-07-06 NOTE — ED Notes (Signed)
Port CXR performed 

## 2021-07-06 NOTE — ED Notes (Addendum)
MD notifed of pt HR 120s, continuing to increase despite IVF infusion.

## 2021-07-06 NOTE — ED Notes (Signed)
Patient transported to CT via stretcher.

## 2021-07-07 DIAGNOSIS — E785 Hyperlipidemia, unspecified: Secondary | ICD-10-CM

## 2021-07-07 DIAGNOSIS — I4891 Unspecified atrial fibrillation: Secondary | ICD-10-CM | POA: Diagnosis not present

## 2021-07-07 DIAGNOSIS — J690 Pneumonitis due to inhalation of food and vomit: Secondary | ICD-10-CM

## 2021-07-07 DIAGNOSIS — E1122 Type 2 diabetes mellitus with diabetic chronic kidney disease: Secondary | ICD-10-CM

## 2021-07-07 DIAGNOSIS — G9341 Metabolic encephalopathy: Secondary | ICD-10-CM | POA: Diagnosis not present

## 2021-07-07 DIAGNOSIS — T68XXXA Hypothermia, initial encounter: Secondary | ICD-10-CM | POA: Diagnosis not present

## 2021-07-07 DIAGNOSIS — N1832 Chronic kidney disease, stage 3b: Secondary | ICD-10-CM

## 2021-07-07 LAB — URINALYSIS, COMPLETE (UACMP) WITH MICROSCOPIC
Bilirubin Urine: NEGATIVE
Glucose, UA: >=500 mg/dL — AB
Hgb urine dipstick: NEGATIVE
Ketones, ur: NEGATIVE mg/dL
Nitrite: NEGATIVE
Protein, ur: 30 mg/dL — AB
Specific Gravity, Urine: 1.021 (ref 1.005–1.030)
WBC, UA: >50 WBC/hpf — ABNORMAL HIGH (ref 0–5)
pH: 6 (ref 5.0–8.0)

## 2021-07-07 LAB — CBC
HCT: 31.7 % — ABNORMAL LOW (ref 36.0–46.0)
Hemoglobin: 10.5 g/dL — ABNORMAL LOW (ref 12.0–15.0)
MCH: 29.7 pg (ref 26.0–34.0)
MCHC: 33.1 g/dL (ref 30.0–36.0)
MCV: 89.8 fL (ref 80.0–100.0)
Platelets: 232 10*3/uL (ref 150–400)
RBC: 3.53 MIL/uL — ABNORMAL LOW (ref 3.87–5.11)
RDW: 14.1 % (ref 11.5–15.5)
WBC: 10.8 10*3/uL — ABNORMAL HIGH (ref 4.0–10.5)
nRBC: 0 % (ref 0.0–0.2)

## 2021-07-07 LAB — GLUCOSE, CAPILLARY
Glucose-Capillary: 160 mg/dL — ABNORMAL HIGH (ref 70–99)
Glucose-Capillary: 287 mg/dL — ABNORMAL HIGH (ref 70–99)
Glucose-Capillary: 283 mg/dL — ABNORMAL HIGH (ref 70–99)
Glucose-Capillary: 167 mg/dL — ABNORMAL HIGH (ref 70–99)

## 2021-07-07 LAB — BASIC METABOLIC PANEL
Anion gap: 6 (ref 5–15)
BUN: 24 mg/dL — ABNORMAL HIGH (ref 8–23)
CO2: 25 mmol/L (ref 22–32)
Calcium: 8.9 mg/dL (ref 8.9–10.3)
Chloride: 106 mmol/L (ref 98–111)
Creatinine, Ser: 1.13 mg/dL — ABNORMAL HIGH (ref 0.44–1.00)
GFR, Estimated: 52 mL/min — ABNORMAL LOW (ref >60)
Glucose, Bld: 181 mg/dL — ABNORMAL HIGH (ref 70–99)
Potassium: 4.4 mmol/L (ref 3.5–5.1)
Sodium: 137 mmol/L (ref 135–145)

## 2021-07-07 LAB — PROCALCITONIN: Procalcitonin: <0.1 ng/mL

## 2021-07-07 MED ORDER — GUAIFENESIN 100 MG/5ML PO SOLN
20.0000 mL | ORAL | Status: DC
  Administered 2021-07-07 – 2021-07-14 (×18): 400 mg via ORAL
  Filled 2021-07-07 (×43): qty 20

## 2021-07-07 MED ORDER — GUAIFENESIN ER 600 MG PO TB12
1200.0000 mg | ORAL_TABLET | Freq: Two times a day (BID) | ORAL | Status: DC
  Administered 2021-07-07: 1200 mg via ORAL
  Filled 2021-07-07: qty 2

## 2021-07-07 MED ORDER — SODIUM CHLORIDE 0.9 % IV SOLN: INTRAVENOUS | Status: AC

## 2021-07-07 MED ORDER — ENSURE ENLIVE PO LIQD
237.0000 mL | Freq: Three times a day (TID) | ORAL | Status: DC
  Administered 2021-07-07 – 2021-07-14 (×17): 237 mL via ORAL

## 2021-07-07 NOTE — Progress Notes (Signed)
PHARMACY - PHYSICIAN COMMUNICATION CRITICAL VALUE ALERT - BLOOD CULTURE IDENTIFICATION (BCID)  Leah Ritter is an 70 y.o. female who presented to [facility] on 07/06/2021 with a chief complaint of acute metabolic encephalopathy  Assessment:  Gram positive rods in 1 of 4 bottles (include suspected source if known)  Name of physician (or Provider) Contacted: [place]  Current antibiotics: Unasyn 3 gm IV Q6H   Changes to prescribed antibiotics recommended:  No, most likely a contaminant  Results for orders placed or performed during the hospital encounter of 05/05/21  Blood Culture ID Panel (Reflexed) (Collected: 05/06/2021 12:18 AM)  Result Value Ref Range   Enterococcus faecalis NOT DETECTED NOT DETECTED   Enterococcus Faecium NOT DETECTED NOT DETECTED   Listeria monocytogenes NOT DETECTED NOT DETECTED   Staphylococcus species NOT DETECTED NOT DETECTED   Staphylococcus aureus (BCID) NOT DETECTED NOT DETECTED   Staphylococcus epidermidis NOT DETECTED NOT DETECTED   Staphylococcus lugdunensis NOT DETECTED NOT DETECTED   Streptococcus species NOT DETECTED NOT DETECTED   Streptococcus agalactiae NOT DETECTED NOT DETECTED   Streptococcus pneumoniae NOT DETECTED NOT DETECTED   Streptococcus pyogenes NOT DETECTED NOT DETECTED   A.calcoaceticus-baumannii NOT DETECTED NOT DETECTED   Bacteroides fragilis NOT DETECTED NOT DETECTED   Enterobacterales NOT DETECTED NOT DETECTED   Enterobacter cloacae complex NOT DETECTED NOT DETECTED   Escherichia coli NOT DETECTED NOT DETECTED   Klebsiella aerogenes NOT DETECTED NOT DETECTED   Klebsiella oxytoca NOT DETECTED NOT DETECTED   Klebsiella pneumoniae NOT DETECTED NOT DETECTED   Proteus species NOT DETECTED NOT DETECTED   Salmonella species NOT DETECTED NOT DETECTED   Serratia marcescens NOT DETECTED NOT DETECTED   Haemophilus influenzae NOT DETECTED NOT DETECTED   Neisseria meningitidis NOT DETECTED NOT DETECTED   Pseudomonas aeruginosa NOT  DETECTED NOT DETECTED   Stenotrophomonas maltophilia NOT DETECTED NOT DETECTED   Candida albicans NOT DETECTED NOT DETECTED   Candida auris NOT DETECTED NOT DETECTED   Candida glabrata NOT DETECTED NOT DETECTED   Candida krusei NOT DETECTED NOT DETECTED   Candida parapsilosis NOT DETECTED NOT DETECTED   Candida tropicalis NOT DETECTED NOT DETECTED   Cryptococcus neoformans/gattii NOT DETECTED NOT DETECTED    Lakota Markgraf D 07/07/2021  4:59 AM

## 2021-07-07 NOTE — Progress Notes (Signed)
PROGRESS NOTE    Leah Ritter  DVV:[id number] DOB: 1950/12/05 DOA: 07/06/2021 PCP: Mortimer Fries, PA   Brief Narrative:  The patient is a 70 year old obese African-American female with a past medical history significant for but not limited to hypertension, hyperlipidemia, diabetes mellitus type 2, history of CVA, depression anxiety, history of OSA not on CPAP, history of chronic CHF, atrial fibrillation on anticoagulation with Xarelto, CKD stage IIIb as well as other comorbidities who presented with altered mental status, nausea, vomiting as well as possible aspiration.  Dr. Ezzard Standing spoke with the patient daughter over the phone and baseline she is normally talking does not usually make sense and is difficult to understand.  Patient does recognize her daughter but is not oriented to time and per report patient was found to have worsening mental status and is less responsive in the facility.  She is also had nausea and vomited few times there is concern for aspiration.  In the ED she is barely arousable and not follow commands but she did move all extremities and no facial droop was noted.  She did have a cough no respiratory distress and is unclear if she had any chest pain or abdominal pain if she is not able to provide a subjective history.  Patient was found to be hypothermic with a temperature of 92 and a Bair hugger started in the ED.  Further work-up was done in the ED she had WBC of 11.1 and lactic acid of 1.7.  BNP was 21.4 and blood pressure 126/56.  Chest x-ray was done and showed cardiomegaly and vascular congestion and a CT of the abdomen pelvis was done and were negative for acute issues but did show gallstones without evidence of cholecystitis.  CT of the head was negative for acute cranial abnormalities.  She is admitted for her acute metabolic encephalopathy and likely possible aspiration pneumonia and was initiated on IV Unasyn.  Blood cultures x2 show 1 out of 4 cultures growing  gram-positive rods which is likely contaminant.  We will continue empiric antibiotics and hold her secondary to medications and continue to treat aspiration pneumonia likely in the setting of her nausea vomiting.  We will repeat blood cultures and continue empiric antibiotics  Assessment & Plan:   Principal Problem:   Acute metabolic encephalopathy Active Problems:   HLD (hyperlipidemia)   Hypothermia   Type II diabetes mellitus with renal manifestations (HCC)   Stroke (HCC)   Hypertension   A-fib (HCC)   Chronic diastolic CHF (congestive heart failure) (HCC)   Depression with anxiety   CKD (chronic kidney disease), stage IIIb   Aspiration pneumonia (HCC)   Nausea & vomiting  Acute Metabolic Encephalopathy superimposed on Chronic Dementia -Etiology is not clear.  CT head is negative for acute intracranial abnormalities.   -Polypharmacy is a differential diagnosis. -Admit to progressive bed as inpatient -Hold sedative medications, including Xanax, Remeron, Seroquel -Frequent Neurochecks -She is still extremely altered and does not make sense with her communication but does appear little agitated -We will place on delirium precautions -May consider MRI and EEG as well as neuro consultation if not improving however likely is a progression of her dementia and acutely decompensated in the setting of infection -Continue to metabolic issues -Her blood cultures 1 out of 4 grew out gram-positive rods so we will repeat blood cultures times -Continue to Monitor   Sepsis secondary to aspiration PNA -Had a mild Leukocytosis admission which is minimally improved and went from 11.3 and is now  10.8 -Lactic acid level was normal and procalcitonin was less than 0.10x2 -She did meet criteria for sepsis as she was hypothermic, tachypneic, tachycardic, had a leukocytosis and a source of infection -Her O2 saturation was normal -Sepsis criteria is improved and she is afebrile and her tachycardia and  tachypnea is resolved -He was placed on Unasyn but did receive dose of IV Rocephin and azithromycin in the ED -We will continue bronchodilators and sputum culture as well as follow-up on blood cultures -We will add flutter valve, incentive spirometry and guaifenesin -SLP evaluated) put the patient on pured diet with thin liquids and continue monitor for diet advancement -Her mentation is improving and she is a little bit more awake and alert though very confused   HLD (hyperlipidemia) -Continue with atorvastatin 80 mg p.o. daily   Hypothermia: -Initial body temperature 92, improved to 96 on Bair hugger -On Bair hugger and improved -Temperature trended up to 99.4 is now at 98.7   Type II diabetes mellitus with renal manifestations (HCC): -Recent A1c 10.5, poorly controlled.   -Patient is taking Glipizide at the nursing facility -Continue sensitive NovoLog/scale insulin before meals and at bedtime -CBGs ranging from 160-251 -Continue to monitor blood sugars per protocol   History of stroke (HCC) -CT showed chronic CVA  -Continue aspirin 81 mg p.o. daily as well as atorvastatin 80 mg p.o. daily   Hypertension -Blood pressure is improved and last blood pressure reading is now 129/54 -Continue with metoprolol succinate 150 mg p.o. daily as well as IV hydralazine 5 mg every 2 as needed for high blood pressure for systolic blood pressure greater than 65  Hyponatremia -Mild and improved and went from 133 is now 137 -Given the bolus of IV fluid hydration and was placed on maintenance IV fluid hydration which we will continue with normal saline for 75 MLS per hour for another 12 more hours given her history of heart failure -Continue monitor and trend and repeat CMP in a.m.  A-fib (HCC) -Continue with metoprolol succinate 150 mg p.o. daily as well as anticoagulation with rivaroxaban 20 mg daily with supper -Continue with telemetry monitoring   Chronic Diastolic CHF (congestive heart  failure) (HCC): -2D echo on 05/07/2021 showed EF > 75% with grade 1 diastolic dysfunction.   -Patient does not have leg edema, no DVT.  Normal BNP 21.4.  -Does not seem to have CHF exacerbation but will need to continue to monitor given that she is getting IV fluid hydration -Chest x-ray showed "Cardiomegaly with unchanged to slightly increased pulmonary vascular congestion/mild edema." -Hold Lasix, spironolactone since the patient was septic on admission but may resume soon -Monitor for signs and symptoms of volume overload and will obtain strict I's and O's and daily weights   Depression with anxiety -Holding Xanax, Remeron, Seroquel due to concern for polypharmacy   CKD (chronic kidney disease), stage IIIb -Stable -Patient's BUNs/creatinine went from 30/1.17 -> 29/1.12 -> 24/1.13 -Avoid further nephrotoxic medications, contrast dyes, hypotension renally adjust medications -Continue monitor and trend renal function carefully and repeat CMP in a.m.   Nausea & Vomiting -CT of abdomen/pelvis is negative for acute intra-abdominal issues.  Lipase normal 42. -C/w Supportive care and gentle Hydration -C/w antiemetics with IV ondansetron 4 mg every 8 as needed for nausea vomiting  Obesity -Complicates overall prognosis and care -Estimated body mass index is 31.38 kg/m as calculated from the following:   Height as of this encounter: 5\' 10"  (1.778 m).   Weight as of this encounter: 99.2  kg. -Weight Loss and Dietary Counseling given    DVT prophylaxis: Anticoagulated with rivaroxaban Code Status: DO NOT RESUSCITATE  Family Communication: Discussed with the daughter at bedside Disposition Plan: Pending further clinical improvement in her mentation and tolerance of diet without nausea or vomiting; will repeat chest x-ray in a.m.  Status is: Inpatient  Remains inpatient appropriate because:Unsafe d/c plan, IV treatments appropriate due to intensity of illness or inability to take PO, and  Inpatient level of care appropriate due to severity of illness  Dispo: The patient is from: SNF              Anticipated d/c is to: SNF              Patient currently is not medically stable to d/c.   Difficult to place patient No  Consultants:  None  Procedures: None  Antimicrobials:  Anti-infectives (From admission, onward)    Start     Dose/Rate Route Frequency Ordered Stop   07/06/21 1200  Ampicillin-Sulbactam (UNASYN) 3 g in sodium chloride 0.9 % 100 mL IVPB        3 g 200 mL/hr over 30 Minutes Intravenous Every 6 hours 07/06/21 1116     07/06/21 0900  cefTRIAXone (ROCEPHIN) 1 g in sodium chloride 0.9 % 100 mL IVPB  Status:  Discontinued        1 g 200 mL/hr over 30 Minutes Intravenous  Once 07/06/21 0849 07/06/21 0850   07/06/21 0900  cefTRIAXone (ROCEPHIN) 2 g in sodium chloride 0.9 % 100 mL IVPB  Status:  Discontinued        2 g 200 mL/hr over 30 Minutes Intravenous Every 24 hours 07/06/21 0851 07/06/21 1115   07/06/21 0900  azithromycin (ZITHROMAX) 500 mg in sodium chloride 0.9 % 250 mL IVPB  Status:  Discontinued        500 mg 250 mL/hr over 60 Minutes Intravenous Every 24 hours 07/06/21 0851 07/06/21 1115        Subjective: Seen and examined at bedside and she was still somewhat confused and a little difficult to arouse but then once aroused she started mumbling and having a conversation with herself.  Daughter states that she has had dementia ever since her stroke.  States that she is progressively gotten worse and states that she has been sleeping more last few weeks.  Daughter does not think she has been at her normal baseline that she was at the SNF.  Denies any complaints but she was little agitated.  No other concerns or complaints at this time.  Objective: Vitals:   07/07/21 0200 07/07/21 0406 07/07/21 0613 07/07/21 0700  BP: 135/62 139/74  (!) 129/54  Pulse:  (!) 105  77  Resp: 17 19  19   Temp: 98 F (36.7 C) 98.8 F (37.1 C)  98.7 F (37.1 C)  TempSrc:  Axillary     SpO2: 97% 100%  100%  Weight:   99.2 kg   Height:        Intake/Output Summary (Last 24 hours) at 07/07/2021 0934 Last data filed at 07/07/2021 0600 Gross per 24 hour  Intake 1791.25 ml  Output 450 ml  Net 1341.25 ml   Filed Weights   07/06/21 0602 07/07/21 0613  Weight: 88.5 kg 99.2 kg   Examination: Physical Exam:  Constitutional: WN/WD obese Caucasian chronically ill appearing demented AAF in NAD and appears a little agitated and a little uncomfortable Eyes: Lids and conjunctivae normal, sclerae anicteric  ENMT: External Ears,  Nose appear normal. Grossly normal hearing.  Neck: Appears normal, supple, no cervical masses, normal ROM, no appreciable thyromegaly; no JVD Respiratory: Diminished to auscultation bilaterally, no wheezing, rales, rhonchi or crackles. Normal respiratory effort and patient is not tachypenic. No accessory muscle use.  Cardiovascular: RRR, no murmurs / rubs / gallops. Mild LE Edema Abdomen: Soft, non-tender, Distended 2/2 body habitus. Bowel sounds positive x4.  GU: Deferred. Musculoskeletal: No clubbing / cyanosis of digits/nails. No joint deformity upper and lower extremities. Skin: No rashes, lesions, ulcers on a limited skin evaluation. No induration; Warm and dry.  Neurologic: Does not follow commands but does move her extremities independently and is having a conversation and mumbling Psychiatric: Impaired judgment and insight.  She is awake but not fully alert and oriented x 3.  Slightly agitated mood and appropriate affect.   Data Reviewed: I have personally reviewed following labs and imaging studies  CBC: Recent Labs  Lab 07/06/21 0609 07/07/21 0533  WBC 11.1* 10.8*  NEUTROABS 9.9*  --   HGB 11.5* 10.5*  HCT 33.9* 31.7*  MCV 87.8 89.8  PLT 256 232   Basic Metabolic Panel: Recent Labs  Lab 07/06/21 0609 07/06/21 0746 07/06/21 1223 07/07/21 0533  NA 132* 133*  --  137  K 5.9* 5.6* 5.1 4.4  CL 96* 101  --  106  CO2  25 24  --  25  GLUCOSE 291* 273*  --  181*  BUN 30* 29*  --  24*  CREATININE 1.17* 1.12*  --  1.13*  CALCIUM 9.7 8.7*  --  8.9   GFR: Estimated Creatinine Clearance: 58.2 mL/min (A) (by C-G formula based on SCr of 1.13 mg/dL (H)). Liver Function Tests: Recent Labs  Lab 07/06/21 0609  AST 28  ALT 32  ALKPHOS 112  BILITOT 1.0  PROT 8.2*  ALBUMIN 3.3*   Recent Labs  Lab 07/06/21 0746  LIPASE 42   No results for input(s): AMMONIA in the last 168 hours. Coagulation Profile: No results for input(s): INR, PROTIME in the last 168 hours. Cardiac Enzymes: No results for input(s): CKTOTAL, CKMB, CKMBINDEX, TROPONINI in the last 168 hours. BNP (last 3 results) No results for input(s): PROBNP in the last 8760 hours. HbA1C: No results for input(s): HGBA1C in the last 72 hours. CBG: Recent Labs  Lab 07/06/21 1146 07/06/21 1652 07/06/21 2028 07/07/21 0914  GLUCAP 251* 195* 196* 160*   Lipid Profile: No results for input(s): CHOL, HDL, LDLCALC, TRIG, CHOLHDL, LDLDIRECT in the last 72 hours. Thyroid Function Tests: Recent Labs    07/06/21 1223  FREET4 1.10   Anemia Panel: No results for input(s): VITAMINB12, FOLATE, FERRITIN, TIBC, IRON, RETICCTPCT in the last 72 hours. Sepsis Labs: Recent Labs  Lab 07/06/21 0609 07/07/21 0533  PROCALCITON <0.10 <0.10  LATICACIDVEN 1.7  --     Recent Results (from the past 240 hour(s))  Resp Panel by RT-PCR (Flu A&B, Covid) Nasopharyngeal Swab     Status: None   Collection Time: 07/06/21  6:09 AM   Specimen: Nasopharyngeal Swab; Nasopharyngeal(NP) swabs in vial transport medium  Result Value Ref Range Status   SARS Coronavirus 2 by RT PCR NEGATIVE NEGATIVE Final    Comment: (NOTE) SARS-CoV-2 target nucleic acids are NOT DETECTED.  The SARS-CoV-2 RNA is generally detectable in upper respiratory specimens during the acute phase of infection. The lowest concentration of SARS-CoV-2 viral copies this assay can detect is 138  copies/mL. A negative result does not preclude SARS-Cov-2 infection and should not  be used as the sole basis for treatment or other patient management decisions. A negative result may occur with  improper specimen collection/handling, submission of specimen other than nasopharyngeal swab, presence of viral mutation(s) within the areas targeted by this assay, and inadequate number of viral copies(<138 copies/mL). A negative result must be combined with clinical observations, patient history, and epidemiological information. The expected result is Negative.  Fact Sheet for Patients:  BloggerCourse.com  Fact Sheet for Healthcare Providers:  SeriousBroker.it  This test is no t yet approved or cleared by the [place] FDA and  has been authorized for detection and/or diagnosis of SARS-CoV-2 by FDA under an Emergency Use Authorization (EUA). This EUA will remain  in effect (meaning this test can be used) for the duration of the COVID-19 declaration under Section 564(b)(1) of the Act, 21 U.S.C.section 360bbb-3(b)(1), unless the authorization is terminated  or revoked sooner.       Influenza A by PCR NEGATIVE NEGATIVE Final   Influenza B by PCR NEGATIVE NEGATIVE Final    Comment: (NOTE) The Xpert Xpress SARS-CoV-2/FLU/RSV plus assay is intended as an aid in the diagnosis of influenza from Nasopharyngeal swab specimens and should not be used as a sole basis for treatment. Nasal washings and aspirates are unacceptable for Xpert Xpress SARS-CoV-2/FLU/RSV testing.  Fact Sheet for Patients: BloggerCourse.com  Fact Sheet for Healthcare Providers: SeriousBroker.it  This test is not yet approved or cleared by the [place] FDA and has been authorized for detection and/or diagnosis of SARS-CoV-2 by FDA under an Emergency Use Authorization (EUA). This EUA will remain in effect (meaning  this test can be used) for the duration of the COVID-19 declaration under Section 564(b)(1) of the Act, 21 U.S.C. section 360bbb-3(b)(1), unless the authorization is terminated or revoked.  Performed at [facility], [address] [place], [place] [address]   Blood culture (routine x 2)     Status: None (Preliminary result)   Collection Time: 07/06/21  6:10 AM   Specimen: BLOOD  Result Value Ref Range Status   Specimen Description BLOOD LEFT AC  Final   Special Requests   Final    BOTTLES DRAWN AEROBIC AND ANAEROBIC Blood Culture results may not be optimal due to an excessive volume of blood received in culture bottles   Culture  Setup Time   Final    GRAM POSITIVE RODS AEROBIC BOTTLE ONLY CRITICAL RESULT CALLED TO, READ BACK BY AND VERIFIED WITH: JASON [email or web address]  07/07/21 RH Performed at [facility], [address] [place], [place] [address]    Culture GRAM POSITIVE RODS  Final   Report Status PENDING  Incomplete  Blood culture (routine x 2)     Status: None (Preliminary result)   Collection Time: 07/06/21  6:10 AM   Specimen: BLOOD  Result Value Ref Range Status   Specimen Description BLOOD LEFT HAND  Final   Special Requests   Final    BOTTLES DRAWN AEROBIC AND ANAEROBIC Blood Culture adequate volume   Culture   Final    NO GROWTH < 24 HOURS Performed at [facility], [address] [place], [place] [address]    Report Status PENDING  Incomplete    RN Pressure Injury Documentation:     Estimated body mass index is 31.38 kg/m as calculated from the following:   Height as of this encounter: 5\' 10"  (1.778 m).   Weight as of this encounter: 99.2 kg.  Malnutrition Type:  Malnutrition Characteristics:   Nutrition Interventions:  Radiology Studies: CT ABDOMEN PELVIS WO CONTRAST  Result Date: 07/06/2021 CLINICAL DATA:  Nausea, vomiting EXAM: CT ABDOMEN AND PELVIS WITHOUT CONTRAST TECHNIQUE: Multidetector CT imaging of the abdomen  and pelvis was performed following the standard protocol without IV contrast. COMPARISON:  None. FINDINGS: Lower chest: Coronary artery and aortic calcifications. Bibasilar atelectasis. Hepatobiliary: Layering gallstones within the gallbladder. No biliary ductal dilatation or visible ductal stones. No focal hepatic abnormality. Pancreas: No focal abnormality or ductal dilatation. Spleen: No focal abnormality.  Normal size. Adrenals/Urinary Tract: No adrenal abnormality. No focal renal abnormality. No stones or hydronephrosis. Urinary bladder is unremarkable. Stomach/Bowel: Normal appendix. Scattered colonic diverticulosis. No active diverticulitis. Stomach and small bowel decompressed. Vascular/Lymphatic: Scattered aortic atherosclerosis. No evidence of aneurysm or adenopathy. Reproductive: Prior hysterectomy.  No adnexal masses. Other: No free fluid or free air. Musculoskeletal: No acute bony abnormality. IMPRESSION: Cholelithiasis.  No CT evidence of acute cholecystitis. Bibasilar atelectasis. Coronary artery disease, aortic atherosclerosis. Scattered colonic diverticulosis. No acute findings in the abdomen or pelvis. Electronically Signed   By: Charlett Nose M.D.   On: 07/06/2021 12:51   CT HEAD WO CONTRAST ( )  Result Date: 07/06/2021 CLINICAL DATA:  Altered mental status. EXAM: CT HEAD WITHOUT CONTRAST TECHNIQUE: Contiguous axial images were obtained from the base of the skull through the vertex without intravenous contrast. COMPARISON:  05/05/2021 FINDINGS: Brain: There is no evidence of an acute infarct, intracranial hemorrhage, mass, midline shift, or extra-axial fluid collection. A small to moderate-sized chronic infarct is again noted in the posterior right frontal lobe. Hypodensities elsewhere in the cerebral white matter bilaterally are unchanged and nonspecific but compatible with mild chronic small vessel ischemic disease. Mild cerebral atrophy is unchanged. Vascular: Calcified atherosclerosis at  the skull base. No hyperdense vessel. Skull: No acute fracture or suspicious osseous lesion. Sinuses/Orbits: Mucosal thickening and large volume fluid in the left maxillary sinus. Mild mucosal thickening in the other left-sided paranasal sinuses. Clear mastoid air cells. Unremarkable orbits. Other: None. IMPRESSION: 1. No evidence of acute intracranial abnormality. 2. Chronic ischemia including a chronic right frontal infarct. 3. Left maxillary sinusitis. Electronically Signed   By: Sebastian Ache M.D.   On: 07/06/2021 06:45   DG Chest Portable 1 View  Result Date: 07/06/2021 CLINICAL DATA:  Altered mental status.  Vomiting. EXAM: PORTABLE CHEST 1 VIEW COMPARISON:  05/05/2021 FINDINGS: The cardiac silhouette remains enlarged. Lung volumes remain low with similar appearance of pulmonary vascular congestion and stable to slightly increased interstitial prominence. There may be trace pleural effusions. No pneumothorax is identified. No acute osseous abnormality is seen. IMPRESSION: Cardiomegaly with unchanged to slightly increased pulmonary vascular congestion/mild edema. Electronically Signed   By: Sebastian Ache M.D.   On: 07/06/2021 06:29    Scheduled Meds:  aspirin EC  81 mg Oral Daily   atorvastatin  80 mg Oral q1800   cholecalciferol  2,000 Units Oral BH-q7a   insulin aspart  0-5 Units Subcutaneous QHS   insulin aspart  0-9 Units Subcutaneous TID WC   metoprolol succinate  150 mg Oral Daily   mirabegron ER  25 mg Oral Daily   ondansetron (ZOFRAN) IV  4 mg Intravenous Once   rivaroxaban  20 mg Oral Q supper   Continuous Infusions:  sodium chloride 75 mL/hr at 07/07/21 0621   ampicillin-sulbactam (UNASYN) IV 3 g (07/07/21 0624)    LOS: 1 day   Merlene Laughter, DO [facility] PAGER is on AMION  If 7PM-7AM, please contact night-coverage [email or web address]

## 2021-07-07 NOTE — Evaluation (Signed)
Clinical/Bedside Swallow Evaluation Patient Details  Name: Leah Ritter MRN: [id number] Date of Birth: 09/15/1951  Today's Date: 07/07/2021 Time: SLP Start Time (ACUTE ONLY): 1105 SLP Stop Time (ACUTE ONLY): 1205 SLP Time Calculation (min) (ACUTE ONLY): 60 min  Past Medical History:  Past Medical History:  Diagnosis Date   A-fib (HCC)    Anxiety    CHF (congestive heart failure) (HCC)    Diabetes (HCC)    GERD (gastroesophageal reflux disease)    Hypertension    Sleep apnea    Stroke [facility])    Past Surgical History:  Past Surgical History:  Procedure Laterality Date   PARTIAL HYSTERECTOMY     tubligation     HPI:  Pt  is a 70 y.o. female with medical history significant for Dementia per chart notes needing assistance w/ ADLs at NH, hypertension, hyperlipidemia, diabetes mellitus, stroke, depression, anxiety, OSA not on CPAP, CHF, atrial fibrillation on Xarelto, CKD-3B, who presents with altered mental status, nausea, vomiting, possible aspiration of vomit.  Pt's previous admit in 04/2021 for AKI.  CXR at admit: Cardiomegaly with unchanged to slightly increased pulmonary vascular  congestion/mild edema.    Assessment / Plan / Recommendation  Clinical Impression  Pt appears to present w/ Min oral phase dsyphagia (but grossly adequate oropharyngeal phase swallowing function w/ modified diet/food consistency) in light of declined Cognitive status; Baseline Dementia. Pt was awake, verbally engaged(muttered/mumbled speech) w/ Dtr/SLP w/ frequent perseverations of tangential, speech/thoughts re: babies, something on the floor; much of the mumbled speech was incoherent. This seemed to be baseline per Dtr present in room. It was difficult to re-direct pt at times. This presentation can impact her overall attention/awareness/timing of swallow and safety during po tasks which increases risk for aspiration, choking. Pt's risk for aspiration is present, but it can be reduced when following general  aspiration precautions, giving feeding support, and using a modified diet consistency w/ puree foods initially. She required verbal/visual/tactile cues during po tasks.       Pt consumed trials of purees and thin liquids via straw primarily w/ No immediate, overt clinical s/s of aspiration noted; no decline in vocal quality; no cough except x1 at the end of all po's when talking post drinking, and no decline in respiratory status during/post trials. Oral phase was min slow bolus management/holding and oral clearing of the boluses given -- suspect impacted by reduced awareness overall. Min extra Time needed to complete the swallow and for full oral clearing. Pt was able to help hold Cup w/ guidance during drinking but required full support; full support and guidance when eating foods d/t Cognitive decline. OM Exam appeared grossly [facility] w/ No unilateral lingual/oral weakness noted; missing most Dentition(no upper Dentition). Confusion of OM tasks and oral care.         D/t pt's Baseline, declined Cognitive status/Dementia and risk for aspiration, recommend initiation of the dysphagia level 1(puree) w/ thin liquids; general aspiration precautions; reduce Distractions during meals and engage pt during po's at meal for self-feeding. Pills Crushed in Puree for safer swallowing. Support w/ feeding at meals as needed. Reflux precautions. Recommend Dietician f/u. MD/NSG updated. SLP Visit Diagnosis: Dysphagia, oral phase (R13.11) (baseline Dementia)    Aspiration Risk  Mild aspiration risk;Risk for inadequate nutrition/hydration (reduced following precautions)    Diet Recommendation   dysphagia level 1(puree) w/ thin liquids; general aspiration precautions; reduce Distractions during meals and engage pt during po's at meal for self-feeding. Support w/ feeding at meals. Reflux precautions  Medication  Administration: Crushed with puree    Other  Recommendations Recommended Consults:  (Dietician f/u) Oral Care  Recommendations: Oral care BID;Oral care before and after PO;Staff/trained caregiver to provide oral care Other Recommendations:  (n/a)    Recommendations for follow up therapy are one component of a multi-disciplinary discharge planning process, led by the attending physician.  Recommendations may be updated based on patient status, additional functional criteria and insurance authorization.  Follow up Recommendations  (TBD)      Frequency and Duration min 2x/week  2 weeks;1 week       Prognosis Prognosis for Safe Diet Advancement: Fair Barriers to Reach Goals: Cognitive deficits;Time post onset;Severity of deficits;Behavior      Swallow Study   General Date of Onset: 07/06/21 HPI: Pt  is a 70 y.o. female with medical history significant for Dementia per chart notes needing assistance w/ ADLs at NH, hypertension, hyperlipidemia, diabetes mellitus, stroke, depression, anxiety, OSA not on CPAP, CHF, atrial fibrillation on Xarelto, CKD-3B, who presents with altered mental status, nausea, vomiting, possible aspiration of vomit.  Pt's previous admit in 04/2021 for AKI.  CXR at admit: Cardiomegaly with unchanged to slightly increased pulmonary vascular  congestion/mild edema. Type of Study: Bedside Swallow Evaluation Previous Swallow Assessment: 04/2021 Diet Prior to this Study: Dysphagia 3 (soft);Thin liquids Temperature Spikes Noted: No (wbc 10.8) Respiratory Status: Room air History of Recent Intubation: No Behavior/Cognition: Alert;Cooperative;Pleasant mood;Confused;Distractible;Requires cueing;Doesn't follow directions (baseline Dementia) Oral Cavity Assessment: Dry (reduced cooperation to fully assess) Oral Care Completed by SLP: Recent completion by staff Oral Cavity - Dentition: Missing dentition (most -- no upper Dentition; few front lower teeth) Vision:  (n/a) Self-Feeding Abilities: Able to feed self;Needs assist;Needs set up;Total assist (able to hold cup to drink  somewhat) Patient Positioning: Upright in bed (needed full positioning support) Baseline Vocal Quality: Low vocal intensity (muttered/mumbled speech) Volitional Cough: Cognitively unable to elicit Volitional Swallow: Unable to elicit    Oral/Motor/Sensory Function Overall Oral Motor/Sensory Function: Within functional limits (during bolus management; no unilatera weakness noted)   Ice Chips Ice chips: Not tested Other Comments: confusion   Thin Liquid Thin Liquid: Within functional limits Presentation: Spoon;Straw;Self Fed (fully supported d/t confusion: ~4 ozs total)    Nectar Thick Nectar Thick Liquid: Not tested   Honey Thick Honey Thick Liquid: Not tested   Puree Puree: Impaired Presentation: Spoon (fed; 10 trials) Oral Phase Impairments: Poor awareness of bolus Oral Phase Functional Implications: Prolonged oral transit Pharyngeal Phase Impairments:  (none) Other Comments: confusion   Solid     Solid: Not tested       Jerilynn Som, MS, CCC-SLP Speech Language Pathologist Rehab Services [phone] [place]ley [place] 07/07/2021,5:20 PM

## 2021-07-08 ENCOUNTER — Inpatient Hospital Stay: Payer: Medicare Other

## 2021-07-08 DIAGNOSIS — G9341 Metabolic encephalopathy: Secondary | ICD-10-CM | POA: Diagnosis not present

## 2021-07-08 LAB — VITAMIN B12: Vitamin B-12: 872 pg/mL (ref 180–914)

## 2021-07-08 LAB — CBC WITH DIFFERENTIAL/PLATELET
Abs Immature Granulocytes: 0.03 10*3/uL (ref 0.00–0.07)
Basophils Absolute: 0 10*3/uL (ref 0.0–0.1)
Basophils Relative: 0 %
Eosinophils Absolute: 0.2 10*3/uL (ref 0.0–0.5)
Eosinophils Relative: 2 %
HCT: 30 % — ABNORMAL LOW (ref 36.0–46.0)
Hemoglobin: 10 g/dL — ABNORMAL LOW (ref 12.0–15.0)
Immature Granulocytes: 0 %
Lymphocytes Relative: 18 %
Lymphs Abs: 1.6 10*3/uL (ref 0.7–4.0)
MCH: 29.6 pg (ref 26.0–34.0)
MCHC: 33.3 g/dL (ref 30.0–36.0)
MCV: 88.8 fL (ref 80.0–100.0)
Monocytes Absolute: 0.9 10*3/uL (ref 0.1–1.0)
Monocytes Relative: 10 %
Neutro Abs: 6.2 10*3/uL (ref 1.7–7.7)
Neutrophils Relative %: 70 %
Platelets: 238 10*3/uL (ref 150–400)
RBC: 3.38 MIL/uL — ABNORMAL LOW (ref 3.87–5.11)
RDW: 14.3 % (ref 11.5–15.5)
WBC: 9 10*3/uL (ref 4.0–10.5)
nRBC: 0 % (ref 0.0–0.2)

## 2021-07-08 LAB — COMPREHENSIVE METABOLIC PANEL
ALT: 35 U/L (ref 0–44)
AST: 28 U/L (ref 15–41)
Albumin: 2.5 g/dL — ABNORMAL LOW (ref 3.5–5.0)
Alkaline Phosphatase: 93 U/L (ref 38–126)
Anion gap: 8 (ref 5–15)
BUN: 20 mg/dL (ref 8–23)
CO2: 25 mmol/L (ref 22–32)
Calcium: 8.5 mg/dL — ABNORMAL LOW (ref 8.9–10.3)
Chloride: 105 mmol/L (ref 98–111)
Creatinine, Ser: 1.07 mg/dL — ABNORMAL HIGH (ref 0.44–1.00)
GFR, Estimated: 56 mL/min — ABNORMAL LOW (ref >60)
Glucose, Bld: 136 mg/dL — ABNORMAL HIGH (ref 70–99)
Potassium: 4.1 mmol/L (ref 3.5–5.1)
Sodium: 138 mmol/L (ref 135–145)
Total Bilirubin: 0.7 mg/dL (ref 0.3–1.2)
Total Protein: 6.6 g/dL (ref 6.5–8.1)

## 2021-07-08 LAB — IRON AND TIBC
Iron: 35 ug/dL (ref 28–170)
Saturation Ratios: 13 % (ref 10.4–31.8)
TIBC: 270 ug/dL (ref 250–450)
UIBC: 235 ug/dL

## 2021-07-08 LAB — GLUCOSE, CAPILLARY
Glucose-Capillary: 142 mg/dL — ABNORMAL HIGH (ref 70–99)
Glucose-Capillary: 200 mg/dL — ABNORMAL HIGH (ref 70–99)
Glucose-Capillary: 192 mg/dL — ABNORMAL HIGH (ref 70–99)
Glucose-Capillary: 142 mg/dL — ABNORMAL HIGH (ref 70–99)

## 2021-07-08 LAB — PHOSPHORUS: Phosphorus: 2.7 mg/dL (ref 2.5–4.6)

## 2021-07-08 LAB — PROCALCITONIN: Procalcitonin: <0.1 ng/mL

## 2021-07-08 LAB — VITAMIN D 25 HYDROXY (VIT D DEFICIENCY, FRACTURES): Vit D, 25-Hydroxy: 56.57 ng/mL (ref 30–100)

## 2021-07-08 LAB — FOLATE: Folate: 12.3 ng/mL (ref >5.9)

## 2021-07-08 LAB — MAGNESIUM: Magnesium: 1.7 mg/dL (ref 1.7–2.4)

## 2021-07-08 MED ORDER — ADULT MULTIVITAMIN W/MINERALS CH
1.0000 | ORAL_TABLET | Freq: Every day | ORAL | Status: DC
  Administered 2021-07-08 – 2021-07-14 (×7): 1 via ORAL
  Filled 2021-07-08 (×7): qty 1

## 2021-07-08 NOTE — Progress Notes (Signed)
Initial Nutrition Assessment  DOCUMENTATION CODES:  Obesity unspecified  INTERVENTION:  Continue diet order per SLP.  Continue Ensure TID.  Add MVI with minerals daily.  Recommend feeding assistance with meals.  Encourage PO intake.  NUTRITION DIAGNOSIS:  Inadequate oral intake related to lethargy/confusion as evidenced by per patient/family report.  GOAL:  Patient will meet greater than or equal to 90% of their needs  MONITOR:  PO intake, Supplement acceptance, Diet advancement, Labs, Weight trends, I & O's  REASON FOR ASSESSMENT:  Low Braden    ASSESSMENT:  70 yo female with a PMH of dementia, HTN, HLD, T2DM, CVA, depression/anxiety, OSA, chronic CHF, A-fib, and CKD stage 3b who presents with acute metabolic encephalopathy and sepsis 2/2 aspiration PNA. 10/11 - Dysphagia 1 with thins per SLP  Pt unable to provide history given dementia, mumbling incoherently when asked questions. No family in room.  Per Epic, meal documentation is limited, with 2 meals of 40% and 70% completion.  Pt's weight appears to be trending up from the past two years.  Recommend continuing Ensure TID and adding MVI with minerals daily. Recommend feeding assistance as well.  Pt with poor oral intake and would benefit from nutrient dense supplement. One Ensure Enlive supplement provides 350 kcals, 20 grams protein, and 44-45 grams of carbohydrate vs one Glucerna shake supplement, which provides 220 kcals, 10 grams of protein, and 26 grams of carbohydrate. Given pt's hx of DM, RD will reassess adequacy of PO intake, CBGs, and adjust supplement regimen as appropriate at follow-up.   Supplements: Ensure TID  Medications: reviewed; SSI  Labs: reviewed; CBG 142-287 (H) HbA1c: 10.5% (05/06/2021)  NUTRITION - FOCUSED PHYSICAL EXAM: Flowsheet Row Most Recent Value  Orbital Region Mild depletion  Upper Arm Region No depletion  Thoracic and Lumbar Region No depletion  Buccal Region No depletion   Temple Region Mild depletion  Clavicle Bone Region No depletion  Clavicle and Acromion Bone Region No depletion  Scapular Bone Region No depletion  Dorsal Hand No depletion  Patellar Region No depletion  Anterior Thigh Region No depletion  Posterior Calf Region No depletion  Edema (RD Assessment) None  Hair Reviewed  Eyes Reviewed  Mouth Reviewed  Skin Reviewed  Nails Reviewed   Diet Order:   Diet Order             DIET - DYS 1 Room service appropriate? Yes with Assist; Fluid consistency: Thin  Diet effective now                  EDUCATION NEEDS:  Not appropriate for education at this time  Skin:  Skin Assessment: Reviewed RN Assessment  Last BM:  07/08/21 - smear  Height:  Ht Readings from Last 1 Encounters:  07/06/21 5\' 10"  (1.778 m)   Weight:  Wt Readings from Last 1 Encounters:  07/08/21 99.1 kg   BMI:  Body mass index is 31.35 kg/m.  Estimated Nutritional Needs:  Kcal:  1650-1850 Protein:  105-120 grams Fluid:  >1.65 L  09/07/21, RD, LDN (she/her/hers) Registered Dietitian I After-Hours/Weekend Pager # in [place]

## 2021-07-08 NOTE — Progress Notes (Addendum)
SLP F/U Note  Patient Details Name: Leah Ritter MRN: [id number] DOB: November 20, 1950   Cancelled treatment:       Reason Eval/Treat Not Completed:  (chart reviewed; consulted NSG). Discussed pt's status today w/ NSG; per Epic and NSG, meal documentation indicates 2 meals of 40% and 70% completion since yesterday when pt was started on a Dysphagia level 1 (puree) diet w/ thin liquids. Pt has Baseline advanced Dementia and acute metabolic encephalopathy and sepsis.  Per agreement w/ NSG, recommend pt continue w/ current dysphagia diet w/ aspiration precautions and Full Feeding support and Supervision (d/t her Dementia) for Discharge to ensure she continues to have successful, safe oral intake to promote healing/recovery. When pt returns to her structured known setting, and encephalopathy has improved, pt can receive f/u at her Facility for trials to upgrade the diet consistency(food) if indicated then. NSG agreed. ST services to sign off w/ MD to reconsult if new needs arise while admitted.       Jerilynn Som, MS, CCC-SLP Speech Language Pathologist Rehab Services [phone] [facility] 07/08/2021, 3:31 PM

## 2021-07-08 NOTE — Progress Notes (Signed)
Can you PROGRESS NOTE    Leah Ritter  KGM:[id number] DOB: [birth year] DOA: 07/06/2021 PCP: Mortimer Fries, PA   Brief Narrative:  The patient is a 70 year old obese African-American female with a past medical history significant for but not limited to hypertension, hyperlipidemia, diabetes mellitus type 2, history of CVA, depression anxiety, history of OSA not on CPAP, history of chronic CHF, atrial fibrillation on anticoagulation with Xarelto, CKD stage IIIb as well as other comorbidities who presented with altered mental status, nausea, vomiting as well as possible aspiration.  Dr. Ezzard Standing spoke with the patient daughter over the phone and baseline she is normally talking does not usually make sense and is difficult to understand.  Patient does recognize her daughter but is not oriented to time and per report patient was found to have worsening mental status and is less responsive in the facility.  She is also had nausea and vomited few times there is concern for aspiration.  In the ED she is barely arousable and not follow commands but she did move all extremities and no facial droop was noted.  She did have a cough no respiratory distress and is unclear if she had any chest pain or abdominal pain if she is not able to provide a subjective history.  Patient was found to be hypothermic with a temperature of 92 and a Bair hugger started in the ED.  Further work-up was done in the ED she had WBC of 11.1 and lactic acid of 1.7.  BNP was 21.4 and blood pressure 126/56.  Chest x-ray was done and showed cardiomegaly and vascular congestion and a CT of the abdomen pelvis was done and were negative for acute issues but did show gallstones without evidence of cholecystitis.  CT of the head was negative for acute cranial abnormalities.  She is admitted for her acute metabolic encephalopathy and likely possible aspiration pneumonia and was initiated on IV Unasyn.   Blood cultures x2 show 1 out of 4 cultures growing  gram-positive rods which is likely contaminant.  We will continue empiric antibiotics and hold her secondary to medications and continue to treat aspiration pneumonia likely in the setting of her nausea vomiting.  We will repeat blood cultures and continue empiric antibiotics  Assessment & Plan:   Principal Problem:   Acute metabolic encephalopathy Active Problems:   HLD (hyperlipidemia)   Hypothermia   Type II diabetes mellitus with renal manifestations (HCC)   Stroke (HCC)   Hypertension   A-fib (HCC)   Chronic diastolic CHF (congestive heart failure) (HCC)   Depression with anxiety   CKD (chronic kidney disease), stage IIIb   Aspiration pneumonia (HCC)   Nausea & vomiting  Acute Metabolic Encephalopathy superimposed on Chronic Dementia -Etiology is not clear.  CT head is negative for acute intracranial abnormalities.   -Polypharmacy is a differential diagnosis. -Hold sedative medications, including Xanax, Remeron, Seroquel -Frequent Neurochecks -She is still extremely altered and does not make sense with her communication but does appear little agitated -Continue delirium precautions -May consider MRI and EEG as well as neuro consultation if not improving however likely is a progression of her dementia and acutely decompensated in the setting of infection -Continue to metabolic issues -Her blood cultures 1 out of 4 grew out gram-positive rods so we will repeat blood cultures times -Continue to Monitor   Sepsis secondary to aspiration PNA -Had a mild Leukocytosis admission which is minimally improved and went from 11.3 and is now 10.8 -Lactic acid level was normal  and procalcitonin was less than 0.10x2 -She did meet criteria for sepsis as she was hypothermic, tachypneic, tachycardic, had a leukocytosis and a source of infection -Her O2 saturation was normal -Sepsis criteria is improved and she is afebrile and her tachycardia and tachypnea is resolved -He was placed on Unasyn  but did receive dose of IV Rocephin and azithromycin in the ED -We will continue bronchodilators and sputum culture as well as follow-up on blood cultures -We will add flutter valve, incentive spirometry and guaifenesin -SLP evaluated) put the patient on pured diet with thin liquids and continue monitor for diet advancement -Her mentation is improving and she is a little bit more awake and alert though very confused 10/12 CXR Low lung volumes with mild pulmonary edema    HLD (hyperlipidemia) -Continue with atorvastatin 80 mg p.o. daily   Hypothermia: -Initial body temperature 92, improved to 96 on Bair hugger -On Bair hugger and improved -Temperature trended up to 99.4 is now at 98.7   Type II diabetes mellitus with renal manifestations (HCC): -Recent A1c 10.5, poorly controlled.   -Patient is taking Glipizide at the nursing facility -Continue sensitive NovoLog/scale insulin before meals and at bedtime -CBGs ranging from 160-251 -Continue to monitor blood sugars per protocol   History of stroke (HCC) -CT showed chronic CVA  -Continue aspirin 81 mg p.o. daily as well as atorvastatin 80 mg p.o. daily   Hypertension -Blood pressure is improved and last blood pressure reading is now 129/54 -Continue with metoprolol succinate 150 mg p.o. daily as well as IV hydralazine 5 mg every 2 as needed for high blood pressure for systolic blood pressure greater than 65  Hyponatremia -Mild and improved and went from 133 is now 137 -Given the bolus of IV fluid hydration and was placed on maintenance IV fluid hydration which we will continue with normal saline for 75 MLS per hour for another 12 more hours given her history of heart failure -Continue monitor and trend and repeat CMP in a.m.  A-fib (HCC) -Continue with metoprolol succinate 150 mg p.o. daily as well as anticoagulation with rivaroxaban 20 mg daily with supper -Continue with telemetry monitoring   Chronic Diastolic CHF (congestive  heart failure) (HCC): -2D echo on 05/07/2021 showed EF > 75% with grade 1 diastolic dysfunction.   -Patient does not have leg edema, no DVT.  Normal BNP 21.4.  -Does not seem to have CHF exacerbation but will need to continue to monitor given that she is getting IV fluid hydration -Chest x-ray showed "Cardiomegaly with unchanged to slightly increased pulmonary vascular congestion/mild edema." -Hold Lasix, spironolactone since the patient was septic on admission but may resume soon -Monitor for signs and symptoms of volume overload and will obtain strict I's and O's and daily weights   Depression with anxiety -Holding Xanax, Remeron, Seroquel due to concern for polypharmacy   CKD (chronic kidney disease), stage IIIb -Stable -Patient's BUNs/creatinine went from 30/1.17 -> 29/1.12 -> 24/1.13 -Avoid further nephrotoxic medications, contrast dyes, hypotension renally adjust medications -Continue monitor and trend renal function carefully and repeat CMP in a.m.   Nausea & Vomiting -CT of abdomen/pelvis is negative for acute intra-abdominal issues.  Lipase normal 42. -C/w Supportive care and gentle Hydration -C/w antiemetics with IV ondansetron 4 mg every 8 as needed for nausea vomiting  Obesity -Complicates overall prognosis and care -Estimated body mass index is 31.35 kg/m as calculated from the following:   Height as of this encounter: 5\' 10"  (1.778 m).   Weight as  of this encounter: 99.1 kg. -Weight Loss and Dietary Counseling given    DVT prophylaxis: Anticoagulated with rivaroxaban Code Status: DO NOT RESUSCITATE  Family Communication: Discussed with the daughter at bedside Disposition Plan: Pending further clinical improvement in her mentation and tolerance of diet without nausea or vomiting; will repeat chest x-ray in a.m.  Status is: Inpatient  Remains inpatient appropriate because:Unsafe d/c plan, IV treatments appropriate due to intensity of illness or inability to take PO, and  Inpatient level of care appropriate due to severity of illness  Dispo: The patient is from: SNF              Anticipated d/c is to: SNF in 1-2 days, pending improvement in mental status, resolution of nocturnal vomiting, if patient tolerates diet and blood culture finalized then probably discharge in 1 to 2 days              Patient currently is not medically stable to d/c.   Difficult to place patient No  Consultants:  None  Procedures: None  Antimicrobials:  Anti-infectives (From admission, onward)    Start     Dose/Rate Route Frequency Ordered Stop   07/06/21 1200  Ampicillin-Sulbactam (UNASYN) 3 g in sodium chloride 0.9 % 100 mL IVPB        3 g 200 mL/hr over 30 Minutes Intravenous Every 6 hours 07/06/21 1116 07/11/21 1159   07/06/21 0900  cefTRIAXone (ROCEPHIN) 1 g in sodium chloride 0.9 % 100 mL IVPB  Status:  Discontinued        1 g 200 mL/hr over 30 Minutes Intravenous  Once 07/06/21 0849 07/06/21 0850   07/06/21 0900  cefTRIAXone (ROCEPHIN) 2 g in sodium chloride 0.9 % 100 mL IVPB  Status:  Discontinued        2 g 200 mL/hr over 30 Minutes Intravenous Every 24 hours 07/06/21 0851 07/06/21 1115   07/06/21 0900  azithromycin (ZITHROMAX) 500 mg in sodium chloride 0.9 % 250 mL IVPB  Status:  Discontinued        500 mg 250 mL/hr over 60 Minutes Intravenous Every 24 hours 07/06/21 0851 07/06/21 1115        Subjective: No significant events overnight,she was lying comfortably, AAO x1, still seems to be confused, patient is speaking randomly which does not make any sense.  Patient is unable to offer any complaints.  Patient's daughter was at bedside, management plan discussed.    Objective: Vitals:   07/08/21 0421 07/08/21 0735 07/08/21 1106 07/08/21 1520  BP: (!) 145/82 (!) 128/111 (!) 113/52 (!) 146/104  Pulse:  95 82 74  Resp: 20 18 18 18   Temp: 98.5 F (36.9 C) 98.5 F (36.9 C) 98.6 F (37 C) 97.7 F (36.5 C)  TempSrc: Axillary Oral Oral Oral  SpO2: 97% 98% 100%  98%  Weight: 99.1 kg     Height:        Intake/Output Summary (Last 24 hours) at 07/08/2021 1554 Last data filed at 07/08/2021 0920 Gross per 24 hour  Intake 703.03 ml  Output 300 ml  Net 403.03 ml   Filed Weights   07/06/21 0602 07/07/21 0613 07/08/21 0421  Weight: 88.5 kg 99.2 kg 99.1 kg   Examination: Physical Exam:  Constitutional: WN/WD obese Caucasian chronically ill appearing demented AAF in NAD and appears a little agitated and a little uncomfortable Eyes: Lids and conjunctivae normal, sclerae anicteric  ENMT: External Ears, Nose appear normal. Grossly normal hearing.  Neck: Appears normal, supple, no cervical  masses, normal ROM, no appreciable thyromegaly; no JVD Respiratory: Diminished to auscultation bilaterally, no wheezing, rales, rhonchi or crackles. Normal respiratory effort and patient is not tachypenic. No accessory muscle use.  Cardiovascular: RRR, no murmurs / rubs / gallops. Mild LE Edema Abdomen: Soft, non-tender, Distended 2/2 body habitus. Bowel sounds positive x4.  GU: Deferred. Musculoskeletal: No clubbing / cyanosis of digits/nails. No joint deformity upper and lower extremities. Skin: No rashes, lesions, ulcers on a limited skin evaluation. No induration; Warm and dry.  Neurologic: Does not follow commands but does move her extremities independently and is having a conversation and mumbling Psychiatric: Impaired judgment and insight.  She is awake, AAOx 1   Data Reviewed: I have personally reviewed following labs and imaging studies  CBC: Recent Labs  Lab 07/06/21 0609 07/07/21 0533 07/08/21 0525  WBC 11.1* 10.8* 9.0  NEUTROABS 9.9*  --  6.2  HGB 11.5* 10.5* 10.0*  HCT 33.9* 31.7* 30.0*  MCV 87.8 89.8 88.8  PLT 256 232 238   Basic Metabolic Panel: Recent Labs  Lab 07/06/21 0609 07/06/21 0746 07/06/21 1223 07/07/21 0533 07/08/21 0525  NA 132* 133*  --  137 138  K 5.9* 5.6* 5.1 4.4 4.1  CL 96* 101  --  106 105  CO2 25 24  --  25 25   GLUCOSE 291* 273*  --  181* 136*  BUN 30* 29*  --  24* 20  CREATININE 1.17* 1.12*  --  1.13* 1.07*  CALCIUM 9.7 8.7*  --  8.9 8.5*  MG  --   --   --   --  1.7  PHOS  --   --   --   --  2.7   GFR: Estimated Creatinine Clearance: 61.4 mL/min (A) (by C-G formula based on SCr of 1.07 mg/dL (H)). Liver Function Tests: Recent Labs  Lab 07/06/21 0609 07/08/21 0525  AST 28 28  ALT 32 35  ALKPHOS 112 93  BILITOT 1.0 0.7  PROT 8.2* 6.6  ALBUMIN 3.3* 2.5*   Recent Labs  Lab 07/06/21 0746  LIPASE 42   No results for input(s): AMMONIA in the last 168 hours. Coagulation Profile: No results for input(s): INR, PROTIME in the last 168 hours. Cardiac Enzymes: No results for input(s): CKTOTAL, CKMB, CKMBINDEX, TROPONINI in the last 168 hours. BNP (last 3 results) No results for input(s): PROBNP in the last 8760 hours. HbA1C: No results for input(s): HGBA1C in the last 72 hours. CBG: Recent Labs  Lab 07/07/21 1807 07/07/21 2240 07/08/21 0736 07/08/21 1107 07/08/21 1518  GLUCAP 283* 167* 142* 200* 192*   Lipid Profile: No results for input(s): CHOL, HDL, LDLCALC, TRIG, CHOLHDL, LDLDIRECT in the last 72 hours. Thyroid Function Tests: Recent Labs    07/06/21 1223  FREET4 1.10   Anemia Panel: No results for input(s): VITAMINB12, FOLATE, FERRITIN, TIBC, IRON, RETICCTPCT in the last 72 hours. Sepsis Labs: Recent Labs  Lab 07/06/21 0609 07/07/21 0533 07/08/21 0525  PROCALCITON <0.10 <0.10 <0.10  LATICACIDVEN 1.7  --   --     Recent Results (from the past 240 hour(s))  Resp Panel by RT-PCR (Flu A&B, Covid) Nasopharyngeal Swab     Status: None   Collection Time: 07/06/21  6:09 AM   Specimen: Nasopharyngeal Swab; Nasopharyngeal(NP) swabs in vial transport medium  Result Value Ref Range Status   SARS Coronavirus 2 by RT PCR NEGATIVE NEGATIVE Final    Comment: (NOTE) SARS-CoV-2 target nucleic acids are NOT DETECTED.  The SARS-CoV-2 RNA  is generally detectable in upper  respiratory specimens during the acute phase of infection. The lowest concentration of SARS-CoV-2 viral copies this assay can detect is 138 copies/mL. A negative result does not preclude SARS-Cov-2 infection and should not be used as the sole basis for treatment or other patient management decisions. A negative result may occur with  improper specimen collection/handling, submission of specimen other than nasopharyngeal swab, presence of viral mutation(s) within the areas targeted by this assay, and inadequate number of viral copies(<138 copies/mL). A negative result must be combined with clinical observations, patient history, and epidemiological information. The expected result is Negative.  Fact Sheet for Patients:  BloggerCourse.com  Fact Sheet for Healthcare Providers:  SeriousBroker.it  This test is no t yet approved or cleared by the [place] FDA and  has been authorized for detection and/or diagnosis of SARS-CoV-2 by FDA under an Emergency Use Authorization (EUA). This EUA will remain  in effect (meaning this test can be used) for the duration of the COVID-19 declaration under Section 564(b)(1) of the Act, 21 U.S.C.section 360bbb-3(b)(1), unless the authorization is terminated  or revoked sooner.       Influenza A by PCR NEGATIVE NEGATIVE Final   Influenza B by PCR NEGATIVE NEGATIVE Final    Comment: (NOTE) The Xpert Xpress SARS-CoV-2/FLU/RSV plus assay is intended as an aid in the diagnosis of influenza from Nasopharyngeal swab specimens and should not be used as a sole basis for treatment. Nasal washings and aspirates are unacceptable for Xpert Xpress SARS-CoV-2/FLU/RSV testing.  Fact Sheet for Patients: BloggerCourse.com  Fact Sheet for Healthcare Providers: SeriousBroker.it  This test is not yet approved or cleared by the [place] FDA and has been  authorized for detection and/or diagnosis of SARS-CoV-2 by FDA under an Emergency Use Authorization (EUA). This EUA will remain in effect (meaning this test can be used) for the duration of the COVID-19 declaration under Section 564(b)(1) of the Act, 21 U.S.C. section 360bbb-3(b)(1), unless the authorization is terminated or revoked.  Performed at [facility], [address] [place], [place] [address]   Blood culture (routine x 2)     Status: None (Preliminary result)   Collection Time: 07/06/21  6:10 AM   Specimen: BLOOD  Result Value Ref Range Status   Specimen Description   Final    BLOOD LEFT AC Performed at [facility], [address], [place], [place] [address]    Special Requests   Final    BOTTLES DRAWN AEROBIC AND ANAEROBIC Blood Culture results may not be optimal due to an excessive volume of blood received in culture bottles Performed at [facility], [address] [place], [place] [address]    Culture  Setup Time   Final    GRAM POSITIVE RODS AEROBIC BOTTLE ONLY CRITICAL RESULT CALLED TO, READ BACK BY AND VERIFIED WITH: [email or web address]  07/07/21 RH Performed at [facility], [address], [place], [place] [address]    Culture   Final    GRAM POSITIVE RODS CULTURE REINCUBATED FOR BETTER GROWTH Performed at [facility], [address] [place], [place] [address]    Report Status PENDING  Incomplete  Blood culture (routine x 2)     Status: None (Preliminary result)   Collection Time: 07/06/21  6:10 AM   Specimen: BLOOD  Result Value Ref Range Status   Specimen Description BLOOD LEFT HAND  Final   Special Requests   Final    BOTTLES DRAWN AEROBIC AND  ANAEROBIC Blood Culture adequate volume   Culture   Final    NO GROWTH 2 DAYS Performed at [facility], [address] [place], [place] [address]    Report Status PENDING  Incomplete  CULTURE, BLOOD (ROUTINE X 2) w Reflex to ID Panel     Status: None  (Preliminary result)   Collection Time: 07/07/21 11:51 AM   Specimen: BLOOD  Result Value Ref Range Status   Specimen Description BLOOD BLOOD RIGHT FOREARM  Final   Special Requests   Final    BOTTLES DRAWN AEROBIC ONLY Blood Culture results may not be optimal due to an inadequate volume of blood received in culture bottles   Culture   Final    NO GROWTH < 24 HOURS Performed at [facility], [address], [place], [place] [address]    Report Status PENDING  Incomplete  CULTURE, BLOOD (ROUTINE X 2) w Reflex to ID Panel     Status: None (Preliminary result)   Collection Time: 07/07/21 12:06 PM   Specimen: BLOOD  Result Value Ref Range Status   Specimen Description BLOOD BLOOD RIGHT HAND  Final   Special Requests   Final    BOTTLES DRAWN AEROBIC AND ANAEROBIC Blood Culture results may not be optimal due to an inadequate volume of blood received in culture bottles   Culture   Final    NO GROWTH < 24 HOURS Performed at [facility] [address] [place], [place] [address]    Report Status PENDING  Incomplete    RN Pressure Injury Documentation:     Estimated body mass index is 31.35 kg/m as calculated from the following:   Height as of this encounter: 5\' 10"  (1.778 m).   Weight as of this encounter: 99.1 kg.  Malnutrition Type:  Malnutrition Characteristics: Signs/Symptoms: per patient/family report Nutrition Interventions: Interventions: Ensure Enlive (each supplement provides 350kcal and 20 grams of protein), Magic cup, MVI Radiology Studies: DG Chest Port 1 View  Result Date: 07/08/2021 CLINICAL DATA:  Shortness of breath. EXAM: PORTABLE CHEST 1 VIEW COMPARISON:  07/06/2021 and CT chest 01/13/2018. FINDINGS: Trachea is midline. Heart is enlarged, as before. Lungs are low in volume with mild interstitial prominence and indistinctness. No airspace consolidation or pleural fluid. IMPRESSION: Low lung volumes with mild pulmonary edema. Electronically  Signed   By: Leanna Battles M.D.   On: 07/08/2021 08:11    Scheduled Meds:  aspirin EC  81 mg Oral Daily   atorvastatin  80 mg Oral q1800   cholecalciferol  2,000 Units Oral BH-q7a   feeding supplement  237 mL Oral TID BM   guaiFENesin  20 mL Oral Q4H   insulin aspart  0-5 Units Subcutaneous QHS   insulin aspart  0-9 Units Subcutaneous TID WC   metoprolol succinate  150 mg Oral Daily   mirabegron ER  25 mg Oral Daily   multivitamin with minerals  1 tablet Oral Daily   ondansetron (ZOFRAN) IV  4 mg Intravenous Once   rivaroxaban  20 mg Oral Q supper   Continuous Infusions:  ampicillin-sulbactam (UNASYN) IV 3 g (07/08/21 1219)    LOS: 2 days   Gillis Santa, DO [facility] PAGER is on AMION  If 7PM-7AM, please contact night-coverage [email or web address]

## 2021-07-09 DIAGNOSIS — G9341 Metabolic encephalopathy: Secondary | ICD-10-CM | POA: Diagnosis not present

## 2021-07-09 LAB — CBC
HCT: 31.7 % — ABNORMAL LOW (ref 36.0–46.0)
Hemoglobin: 10.5 g/dL — ABNORMAL LOW (ref 12.0–15.0)
MCH: 29.6 pg (ref 26.0–34.0)
MCHC: 33.1 g/dL (ref 30.0–36.0)
MCV: 89.3 fL (ref 80.0–100.0)
Platelets: 233 10*3/uL (ref 150–400)
RBC: 3.55 MIL/uL — ABNORMAL LOW (ref 3.87–5.11)
RDW: 14.1 % (ref 11.5–15.5)
WBC: 8.7 10*3/uL (ref 4.0–10.5)
nRBC: 0 % (ref 0.0–0.2)

## 2021-07-09 LAB — BASIC METABOLIC PANEL
Anion gap: 7 (ref 5–15)
BUN: 20 mg/dL (ref 8–23)
CO2: 26 mmol/L (ref 22–32)
Calcium: 9.1 mg/dL (ref 8.9–10.3)
Chloride: 106 mmol/L (ref 98–111)
Creatinine, Ser: 0.88 mg/dL (ref 0.44–1.00)
GFR, Estimated: >60 mL/min (ref >60)
Glucose, Bld: 139 mg/dL — ABNORMAL HIGH (ref 70–99)
Potassium: 4.3 mmol/L (ref 3.5–5.1)
Sodium: 139 mmol/L (ref 135–145)

## 2021-07-09 LAB — GLUCOSE, CAPILLARY
Glucose-Capillary: 127 mg/dL — ABNORMAL HIGH (ref 70–99)
Glucose-Capillary: 224 mg/dL — ABNORMAL HIGH (ref 70–99)
Glucose-Capillary: 272 mg/dL — ABNORMAL HIGH (ref 70–99)
Glucose-Capillary: 185 mg/dL — ABNORMAL HIGH (ref 70–99)

## 2021-07-09 LAB — PHOSPHORUS: Phosphorus: 3.4 mg/dL (ref 2.5–4.6)

## 2021-07-09 LAB — MAGNESIUM: Magnesium: 2 mg/dL (ref 1.7–2.4)

## 2021-07-09 MED ORDER — BISACODYL 5 MG PO TBEC
10.0000 mg | DELAYED_RELEASE_TABLET | Freq: Every day | ORAL | Status: DC | PRN
  Administered 2021-07-09: 10 mg via ORAL
  Filled 2021-07-09: qty 2

## 2021-07-09 MED ORDER — POLYETHYLENE GLYCOL 3350 17 G PO PACK
17.0000 g | PACK | Freq: Every day | ORAL | Status: DC
  Administered 2021-07-09 – 2021-07-14 (×6): 17 g via ORAL
  Filled 2021-07-09 (×6): qty 1

## 2021-07-09 MED ORDER — BISACODYL 10 MG RE SUPP: 10.0000 mg | Freq: Every day | RECTAL | Status: DC | PRN

## 2021-07-09 NOTE — Progress Notes (Signed)
Can you PROGRESS NOTE    Leah Ritter  KVQ:[id number] DOB: 1951/07/18 DOA: 07/06/2021 PCP: Mortimer Fries, PA   Brief Narrative:  The patient is a 70 year old obese African-American female with a past medical history significant for but not limited to hypertension, hyperlipidemia, diabetes mellitus type 2, history of CVA, depression anxiety, history of OSA not on CPAP, history of chronic CHF, atrial fibrillation on anticoagulation with Xarelto, CKD stage IIIb as well as other comorbidities who presented with altered mental status, nausea, vomiting as well as possible aspiration.  Dr. Ezzard Standing spoke with the patient daughter over the phone and baseline she is normally talking does not usually make sense and is difficult to understand.  Patient does recognize her daughter but is not oriented to time and per report patient was found to have worsening mental status and is less responsive in the facility.  She is also had nausea and vomited few times there is concern for aspiration.  In the ED she is barely arousable and not follow commands but she did move all extremities and no facial droop was noted.  She did have a cough no respiratory distress and is unclear if she had any chest pain or abdominal pain if she is not able to provide a subjective history.  Patient was found to be hypothermic with a temperature of 92 and a Bair hugger started in the ED.  Further work-up was done in the ED she had WBC of 11.1 and lactic acid of 1.7.  BNP was 21.4 and blood pressure 126/56.  Chest x-ray was done and showed cardiomegaly and vascular congestion and a CT of the abdomen pelvis was done and were negative for acute issues but did show gallstones without evidence of cholecystitis.  CT of the head was negative for acute cranial abnormalities.  She is admitted for her acute metabolic encephalopathy and likely possible aspiration pneumonia and was initiated on IV Unasyn.   Blood cultures x2 show 1 out of 4 cultures growing  gram-positive rods which is likely contaminant.  We will continue empiric antibiotics and hold her secondary to medications and continue to treat aspiration pneumonia likely in the setting of her nausea vomiting.  We will repeat blood cultures and continue empiric antibiotics  Assessment & Plan:   Principal Problem:   Acute metabolic encephalopathy Active Problems:   HLD (hyperlipidemia)   Hypothermia   Type II diabetes mellitus with renal manifestations (HCC)   Stroke (HCC)   Hypertension   A-fib (HCC)   Chronic diastolic CHF (congestive heart failure) (HCC)   Depression with anxiety   CKD (chronic kidney disease), stage IIIb   Aspiration pneumonia (HCC)   Nausea & vomiting  Acute Metabolic Encephalopathy superimposed on Chronic Dementia -Etiology is not clear.  CT head is negative for acute intracranial abnormalities.   -Polypharmacy is a differential diagnosis. -Hold sedative medications, including Xanax, Remeron, Seroquel -Frequent Neurochecks -She is still extremely altered and does not make sense with her communication but does appear little agitated -Continue delirium precautions -May consider MRI and EEG as well as neuro consultation if not improving however likely is a progression of her dementia and acutely decompensated in the setting of infection -Her blood cultures 1 out of 4 grew out gram-positive rods so we will repeat blood cultures, still final report of culture pending -Continue to Monitor  10/13 psych consulted for further evaluation  Sepsis secondary to aspiration PNA -Had a mild Leukocytosis admission which is minimally improved and went from 11.3 and is  now 10.8 -Lactic acid level was normal and procalcitonin was less than 0.10x2 -She did meet criteria for sepsis as she was hypothermic, tachypneic, tachycardic, had a leukocytosis and a source of infection -Her O2 saturation was normal -Sepsis criteria is improved and she is afebrile and her tachycardia and  tachypnea is resolved -He was placed on Unasyn but did receive dose of IV Rocephin and azithromycin in the ED -We will continue bronchodilators and sputum culture as well as follow-up on blood cultures -We will add flutter valve, incentive spirometry and guaifenesin -SLP evaluated) put the patient on pured diet with thin liquids and continue monitor for diet advancement -Her mentation is improving and she is a little bit more awake and alert though very confused 10/12 CXR Low lung volumes with mild pulmonary edema    HLD (hyperlipidemia) -Continue with atorvastatin 80 mg p.o. daily   Hypothermia: -Initial body temperature 92, improved to 96 on Bair hugger -On Bair hugger and improved -Temperature trended up to 99.4 is now at 98.7   Type II diabetes mellitus with renal manifestations (HCC): -Recent A1c 10.5, poorly controlled.   -Patient is taking Glipizide at the nursing facility -Continue sensitive NovoLog/scale insulin before meals and at bedtime -CBGs ranging from 160-251 -Continue to monitor blood sugars per protocol   History of stroke (HCC) -CT showed chronic CVA  -Continue aspirin 81 mg p.o. daily as well as atorvastatin 80 mg p.o. daily   Hypertension -Blood pressure is improved and last blood pressure reading is now 129/54 -Continue with metoprolol succinate 150 mg p.o. daily as well as IV hydralazine 5 mg every 2 as needed for high blood pressure for systolic blood pressure greater than 65  Hyponatremia -Mild and improved and went from 133 is now 137 -Given the bolus of IV fluid hydration and was placed on maintenance IV fluid hydration which we will continue with normal saline for 75 MLS per hour for another 12 more hours given her history of heart failure -Continue monitor and trend and repeat CMP in a.m.  A-fib (HCC) -Continue with metoprolol succinate 150 mg p.o. daily as well as anticoagulation with rivaroxaban 20 mg daily with supper -Continue with telemetry  monitoring   Chronic Diastolic CHF (congestive heart failure) (HCC): -2D echo on 05/07/2021 showed EF > 75% with grade 1 diastolic dysfunction.   -Patient does not have leg edema, no DVT.  Normal BNP 21.4.  -Does not seem to have CHF exacerbation but will need to continue to monitor given that she is getting IV fluid hydration -Chest x-ray showed "Cardiomegaly with unchanged to slightly increased pulmonary vascular congestion/mild edema." -Hold Lasix, spironolactone since the patient was septic on admission but may resume soon -Monitor for signs and symptoms of volume overload and will obtain strict I's and O's and daily weights   Depression with anxiety -Holding Xanax, Remeron, Seroquel due to concern for polypharmacy   CKD (chronic kidney disease), stage IIIb -Stable -Patient's BUNs/creatinine went from 30/1.17 -> 29/1.12 -> 24/1.13 -Avoid further nephrotoxic medications, contrast dyes, hypotension renally adjust medications -Continue monitor and trend renal function carefully and repeat CMP in a.m.   Nausea & Vomiting -CT of abdomen/pelvis is negative for acute intra-abdominal issues.  Lipase normal 42. -C/w Supportive care and gentle Hydration -C/w antiemetics with IV ondansetron 4 mg every 8 as needed for nausea vomiting  Obesity -Complicates overall prognosis and care -Estimated body mass index is 31.13 kg/m as calculated from the following:   Height as of this encounter: 5'  10" (1.778 m).   Weight as of this encounter: 98.4 kg. -Weight Loss and Dietary Counseling given    DVT prophylaxis: Anticoagulated with rivaroxaban Code Status: DO NOT RESUSCITATE  Family Communication: Discussed with the daughter at bedside Disposition Plan: Pending further clinical improvement in her mentation and tolerance of diet without nausea or vomiting; will repeat chest x-ray in a.m.  Status is: Inpatient  Remains inpatient appropriate because:Unsafe d/c plan, IV treatments appropriate due to  intensity of illness or inability to take PO, and Inpatient level of care appropriate due to severity of illness  Dispo: The patient is from: SNF              Anticipated d/c is to: SNF in 1-2 days, pending improvement in mental status, resolution of nocturnal vomiting, if patient tolerates diet and blood culture finalized then probably discharge in 1 to 2 days.   10/13 patient was having difficulty getting out of the bed, max assist by PT and OT.  We will continue physical therapy and get psych consult for further evaluation and then will plan disposition in 1 to 2 days              Patient currently is not medically stable to d/c.   Difficult to place patient No  Consultants:  None  Procedures: None  Antimicrobials:  Anti-infectives (From admission, onward)    Start     Dose/Rate Route Frequency Ordered Stop   07/06/21 1200  Ampicillin-Sulbactam (UNASYN) 3 g in sodium chloride 0.9 % 100 mL IVPB        3 g 200 mL/hr over 30 Minutes Intravenous Every 6 hours 07/06/21 1116 07/11/21 1159   07/06/21 0900  cefTRIAXone (ROCEPHIN) 1 g in sodium chloride 0.9 % 100 mL IVPB  Status:  Discontinued        1 g 200 mL/hr over 30 Minutes Intravenous  Once 07/06/21 0849 07/06/21 0850   07/06/21 0900  cefTRIAXone (ROCEPHIN) 2 g in sodium chloride 0.9 % 100 mL IVPB  Status:  Discontinued        2 g 200 mL/hr over 30 Minutes Intravenous Every 24 hours 07/06/21 0851 07/06/21 1115   07/06/21 0900  azithromycin (ZITHROMAX) 500 mg in sodium chloride 0.9 % 250 mL IVPB  Status:  Discontinued        500 mg 250 mL/hr over 60 Minutes Intravenous Every 24 hours 07/06/21 0851 07/06/21 1115        Subjective: No significant events overnight, the patient was very sleepy, did not woke up and then physical therapy was trying to wake her up and then she woke up finally and was able to sit at the edge of the bed with max assistance and she refused to ambulate.  Patient seems to be having some behavioral issues so  discussed with patient's daughter at bedside that we will consult psych for further evaluation and then decide for disposition tomorrow a.m.    Objective: Vitals:   07/09/21 0412 07/09/21 0500 07/09/21 0721 07/09/21 1124  BP:   121/82 134/64  Pulse:   79 66  Resp:   18 18  Temp:   98.3 F (36.8 C) (!) 97.4 F (36.3 C)  TempSrc:   Oral   SpO2: 94%  100% 95%  Weight:  98.4 kg    Height:        Intake/Output Summary (Last 24 hours) at 07/09/2021 1528 Last data filed at 07/09/2021 1330 Gross per 24 hour  Intake 2123.18 ml  Output 150 ml  Net 1973.18 ml   Filed Weights   07/07/21 3825 07/08/21 0421 07/09/21 0500  Weight: 99.2 kg 99.1 kg 98.4 kg   Examination: Physical Exam:  Constitutional: WN/WD obese Caucasian chronically ill appearing demented AAF in NAD and appears a little agitated and a little uncomfortable Eyes: Lids and conjunctivae normal, sclerae anicteric  ENMT: External Ears, Nose appear normal. Grossly normal hearing.  Neck: Appears normal, supple, no cervical masses, normal ROM, no appreciable thyromegaly; no JVD Respiratory: Diminished to auscultation bilaterally, no wheezing, rales, rhonchi or crackles. Normal respiratory effort and patient is not tachypenic. No accessory muscle use.  Cardiovascular: RRR, no murmurs / rubs / gallops. Mild LE Edema Abdomen: Soft, non-tender, Distended 2/2 body habitus. Bowel sounds positive x4.  GU: Deferred. Musculoskeletal: No clubbing / cyanosis of digits/nails. No joint deformity upper and lower extremities. Skin: No rashes, lesions, ulcers on a limited skin evaluation. No induration; Warm and dry.  Neurologic: Does not follow commands but does move her extremities independently and is having a conversation and mumbling Psychiatric: Impaired judgment and insight.  She is awake, AAOx 1   Data Reviewed: I have personally reviewed following labs and imaging studies  CBC: Recent Labs  Lab 07/06/21 0609 07/07/21 0533  07/08/21 0525 07/09/21 0644  WBC 11.1* 10.8* 9.0 8.7  NEUTROABS 9.9*  --  6.2  --   HGB 11.5* 10.5* 10.0* 10.5*  HCT 33.9* 31.7* 30.0* 31.7*  MCV 87.8 89.8 88.8 89.3  PLT 256 232 238 233   Basic Metabolic Panel: Recent Labs  Lab 07/06/21 0609 07/06/21 0746 07/06/21 1223 07/07/21 0533 07/08/21 0525 07/09/21 0644  NA 132* 133*  --  137 138 139  K 5.9* 5.6* 5.1 4.4 4.1 4.3  CL 96* 101  --  106 105 106  CO2 25 24  --  25 25 26   GLUCOSE 291* 273*  --  181* 136* 139*  BUN 30* 29*  --  24* 20 20  CREATININE 1.17* 1.12*  --  1.13* 1.07* 0.88  CALCIUM 9.7 8.7*  --  8.9 8.5* 9.1  MG  --   --   --   --  1.7 2.0  PHOS  --   --   --   --  2.7 3.4   GFR: Estimated Creatinine Clearance: 74.5 mL/min (by C-G formula based on SCr of 0.88 mg/dL). Liver Function Tests: Recent Labs  Lab 07/06/21 0609 07/08/21 0525  AST 28 28  ALT 32 35  ALKPHOS 112 93  BILITOT 1.0 0.7  PROT 8.2* 6.6  ALBUMIN 3.3* 2.5*   Recent Labs  Lab 07/06/21 0746  LIPASE 42   No results for input(s): AMMONIA in the last 168 hours. Coagulation Profile: No results for input(s): INR, PROTIME in the last 168 hours. Cardiac Enzymes: No results for input(s): CKTOTAL, CKMB, CKMBINDEX, TROPONINI in the last 168 hours. BNP (last 3 results) No results for input(s): PROBNP in the last 8760 hours. HbA1C: No results for input(s): HGBA1C in the last 72 hours. CBG: Recent Labs  Lab 07/08/21 1107 07/08/21 1518 07/08/21 2110 07/09/21 0723 07/09/21 1126  GLUCAP 200* 192* 142* 127* 224*   Lipid Profile: No results for input(s): CHOL, HDL, LDLCALC, TRIG, CHOLHDL, LDLDIRECT in the last 72 hours. Thyroid Function Tests: No results for input(s): TSH, T4TOTAL, FREET4, T3FREE, THYROIDAB in the last 72 hours.  Anemia Panel: Recent Labs    07/08/21 1614  VITAMINB12 872  FOLATE 12.3  TIBC 270  IRON 35  Sepsis Labs: Recent Labs  Lab 07/06/21 0609 07/07/21 0533 07/08/21 0525  PROCALCITON <0.10 <0.10 <0.10   LATICACIDVEN 1.7  --   --     Recent Results (from the past 240 hour(s))  Resp Panel by RT-PCR (Flu A&B, Covid) Nasopharyngeal Swab     Status: None   Collection Time: 07/06/21  6:09 AM   Specimen: Nasopharyngeal Swab; Nasopharyngeal(NP) swabs in vial transport medium  Result Value Ref Range Status   SARS Coronavirus 2 by RT PCR NEGATIVE NEGATIVE Final    Comment: (NOTE) SARS-CoV-2 target nucleic acids are NOT DETECTED.  The SARS-CoV-2 RNA is generally detectable in upper respiratory specimens during the acute phase of infection. The lowest concentration of SARS-CoV-2 viral copies this assay can detect is 138 copies/mL. A negative result does not preclude SARS-Cov-2 infection and should not be used as the sole basis for treatment or other patient management decisions. A negative result may occur with  improper specimen collection/handling, submission of specimen other than nasopharyngeal swab, presence of viral mutation(s) within the areas targeted by this assay, and inadequate number of viral copies(<138 copies/mL). A negative result must be combined with clinical observations, patient history, and epidemiological information. The expected result is Negative.  Fact Sheet for Patients:  BloggerCourse.com  Fact Sheet for Healthcare Providers:  SeriousBroker.it  This test is no t yet approved or cleared by the [place] FDA and  has been authorized for detection and/or diagnosis of SARS-CoV-2 by FDA under an Emergency Use Authorization (EUA). This EUA will remain  in effect (meaning this test can be used) for the duration of the COVID-19 declaration under Section 564(b)(1) of the Act, 21 U.S.C.section 360bbb-3(b)(1), unless the authorization is terminated  or revoked sooner.       Influenza A by PCR NEGATIVE NEGATIVE Final   Influenza B by PCR NEGATIVE NEGATIVE Final    Comment: (NOTE) The Xpert Xpress SARS-CoV-2/FLU/RSV  plus assay is intended as an aid in the diagnosis of influenza from Nasopharyngeal swab specimens and should not be used as a sole basis for treatment. Nasal washings and aspirates are unacceptable for Xpert Xpress SARS-CoV-2/FLU/RSV testing.  Fact Sheet for Patients: BloggerCourse.com  Fact Sheet for Healthcare Providers: SeriousBroker.it  This test is not yet approved or cleared by the [place] FDA and has been authorized for detection and/or diagnosis of SARS-CoV-2 by FDA under an Emergency Use Authorization (EUA). This EUA will remain in effect (meaning this test can be used) for the duration of the COVID-19 declaration under Section 564(b)(1) of the Act, 21 U.S.C. section 360bbb-3(b)(1), unless the authorization is terminated or revoked.  Performed at [facility], [address] [place], [place] [address]   Blood culture (routine x 2)     Status: None (Preliminary result)   Collection Time: 07/06/21  6:10 AM   Specimen: BLOOD  Result Value Ref Range Status   Specimen Description   Final    BLOOD LEFT [facility] Performed at [facility], [address], [place], [place] [address]    Special Requests   Final    BOTTLES DRAWN AEROBIC AND ANAEROBIC Blood Culture results may not be optimal due to an excessive volume of blood received in culture bottles Performed at [facility], [address], [place], [place] [address]    Culture  Setup Time   Final    GRAM POSITIVE RODS AEROBIC BOTTLE ONLY CRITICAL RESULT CALLED TO, READ BACK BY AND VERIFIED WITH: JASON [email or web address]  07/07/21 RH Performed at  [facility], [address] [place], [place] [address]    Culture   Final    Romie Minus POSITIVE RODS CULTURE REINCUBATED FOR BETTER GROWTH Performed at [facility], [address], [place], [place] [address]    Report Status PENDING  Incomplete  Blood culture (routine x 2)     Status: None  (Preliminary result)   Collection Time: 07/06/21  6:10 AM   Specimen: BLOOD  Result Value Ref Range Status   Specimen Description BLOOD LEFT HAND  Final   Special Requests   Final    BOTTLES DRAWN AEROBIC AND ANAEROBIC Blood Culture adequate volume   Culture   Final    NO GROWTH 3 DAYS Performed at [facility], [address], [place], [place] [address]    Report Status PENDING  Incomplete  CULTURE, BLOOD (ROUTINE X 2) w Reflex to ID Panel     Status: None (Preliminary result)   Collection Time: 07/07/21 11:51 AM   Specimen: BLOOD  Result Value Ref Range Status   Specimen Description BLOOD BLOOD RIGHT FOREARM  Final   Special Requests   Final    BOTTLES DRAWN AEROBIC ONLY Blood Culture results may not be optimal due to an inadequate volume of blood received in culture bottles   Culture   Final    NO GROWTH 2 DAYS Performed at [facility], [address], [place], [place] [address]    Report Status PENDING  Incomplete  CULTURE, BLOOD (ROUTINE X 2) w Reflex to ID Panel     Status: None (Preliminary result)   Collection Time: 07/07/21 12:06 PM   Specimen: BLOOD  Result Value Ref Range Status   Specimen Description BLOOD BLOOD RIGHT HAND  Final   Special Requests   Final    BOTTLES DRAWN AEROBIC AND ANAEROBIC Blood Culture results may not be optimal due to an inadequate volume of blood received in culture bottles   Culture   Final    NO GROWTH 2 DAYS Performed at [facility], [address], [place], [place] [address]    Report Status PENDING  Incomplete    RN Pressure Injury Documentation:     Estimated body mass index is 31.13 kg/m as calculated from the following:   Height as of this encounter:  (1.778 m).   Weight as of this encounter: 98.4 kg.  Malnutrition Type:  Malnutrition Characteristics: Signs/Symptoms: per patient/family report Nutrition Interventions: Interventions: Ensure Enlive (each supplement provides 350kcal and 20  grams of protein), Magic cup, MVI Radiology Studies: DG Chest Port 1 View  Result Date: 07/08/2021 CLINICAL DATA:  Shortness of breath. EXAM: PORTABLE CHEST 1 VIEW COMPARISON:  07/06/2021 and CT chest 01/13/2018. FINDINGS: Trachea is midline. Heart is enlarged, as before. Lungs are low in volume with mild interstitial prominence and indistinctness. No airspace consolidation or pleural fluid. IMPRESSION: Low lung volumes with mild pulmonary edema. Electronically Signed   By: Leanna Battles M.D.   On: 07/08/2021 08:11    Scheduled Meds:  aspirin EC  81 mg Oral Daily   atorvastatin  80 mg Oral q1800   cholecalciferol  2,000 Units Oral BH-q7a   feeding supplement  237 mL Oral TID BM   guaiFENesin  20 mL Oral Q4H   insulin aspart  0-5 Units Subcutaneous QHS   insulin aspart  0-9 Units Subcutaneous TID WC   metoprolol succinate  150 mg Oral Daily   mirabegron ER  25 mg Oral Daily  multivitamin with minerals  1 tablet Oral Daily   ondansetron (ZOFRAN) IV  4 mg Intravenous Once   polyethylene glycol  17 g Oral Daily   rivaroxaban  20 mg Oral Q supper   Continuous Infusions:  ampicillin-sulbactam (UNASYN) IV 3 g (07/09/21 1228)    LOS: 3 days   Gillis Santa, DO [facility] PAGER is on AMION  If 7PM-7AM, please contact night-coverage [email or web address]

## 2021-07-09 NOTE — Care Management Important Message (Signed)
Important Message  Patient Details  Name: Leah Ritter MRN: [id number] Date of Birth: November 12, 1950   Medicare Important Message Given:  Yes     [name] 07/09/2021, 4:32 PM

## 2021-07-09 NOTE — Progress Notes (Signed)
Occupational Therapy Treatment Patient Details Name: Leah Ritter MRN: [id number] DOB: 1951-09-23 Today's Date: 07/09/2021   History of present illness 70 y.o. female with medical history significant for Dementia per chart notes needing assistance w/ ADLs at NH, hypertension, hyperlipidemia, diabetes mellitus, stroke, depression, anxiety, OSA not on CPAP, CHF, atrial fibrillation on Xarelto, CKD-3B, who presents with altered mental status, nausea, vomiting, possible aspiration of vomit.  Pt's previous admit in 04/2021 for AKI.  CXR at admit: Cardiomegaly with unchanged to slightly increased pulmonary vascular  congestion/mild edema.   OT comments  Pt seen for co-tx with PT for standing attempts. Pt seated EOB with PT and family member in room. Pt required VC throughout session for safety, initiating transfer attempts, and +2 Max handheld assist for standing, pt does not maintain BUE support on RW, preferring to hold onto therapists or attempt to reach behind her to close her gown as staff attempt to perform pericare to ensure skin is clean. Set up for attempting pericare, pt using wipe instead to wash her face. Required intermittent redirection and distraction to help facilitate linens change and pericare in standing.  Ultimately requiring PT/OT assist in standing and RN to assist with linens change and pericare while pt was in standing. MAX-TOTAL A for return to bed from PT, OT present for safety. TOTAL A+2 to scoot up and reposition in bed. Continue to recommend return to prior level of care with assist upon discharge.    Recommendations for follow up therapy are one component of a multi-disciplinary discharge planning process, led by the attending physician.  Recommendations may be updated based on patient status, additional functional criteria and insurance authorization.    Follow Up Recommendations  Other (comment) (return to memory care with assist from staff as needed)    Equipment  Recommendations  None recommended by OT    Recommendations for Other Services      Precautions / Restrictions Precautions Precautions: Fall Restrictions Weight Bearing Restrictions: No       Mobility Bed Mobility Overal bed mobility: Needs Assistance Bed Mobility: Sit to Supine     Sit to supine: Max assist;Total assist   General bed mobility comments: assist from PT for return to bed    Transfers Overall transfer level: Needs assistance Equipment used: 2 person hand held assist Transfers: Sit to/from Stand Sit to Stand: Max assist;+2 physical assistance Stand pivot transfers: Min guard;Min assist       General transfer comment: extensive assist to initiate sit/stand, patient resisting at times. Once upright and accomodated to position, does maintain static standing with min assist +1 (slight R lean at times)    Balance Overall balance assessment: Needs assistance Sitting-balance support: No upper extremity supported;Feet supported Sitting balance-Leahy Scale: Good     Standing balance support: Bilateral upper extremity supported Standing balance-Leahy Scale: Poor                             ADL either performed or assessed with clinical judgement   ADL Overall ADL's : Needs assistance/impaired                             Toileting- Clothing Manipulation and Hygiene: Maximal assistance;Sit to/from stand Toileting - Clothing Manipulation Details (indicate cue type and reason): Standing EOB, required MAX A for pericare from RN while OT/PT assisted with standing, pt attempting to cover herself and push away assist, cues  for safety            Vision Patient Visual Report: No change from baseline     Perception     Praxis      Cognition Arousal/Alertness: Lethargic Behavior During Therapy: Flat affect Overall Cognitive Status: History of cognitive impairments - at baseline                                 General  Comments: Pt with very tangential speech and follows commands 25% of the time with mod - max multimodal cuing. Max cuing for alertness during session; did improve once upright at edge of bed        Exercises  Other Exercises: Set up for attempting pericare, pt using wipe instead to wash her face. Required intermittent redirection and distraction to help facilitate linens change and pericare in standing.   Shoulder Instructions       General Comments      Pertinent Vitals/ Pain       Pain Assessment: Faces Faces Pain Scale: No hurt  Home Living Family/patient expects to be discharged to:: Assisted living                             Home Equipment: Walker - 4 wheels   Additional Comments: Pt with baseline dementia and per chart she lives at [facility].      Prior Functioning/Environment Level of Independence: Needs assistance  Gait / Transfers Assistance Needed: Amb household distances 4WW ADL's / Homemaking Assistance Needed: Requires assist   Comments: Per daughter, staff assists with ADLs, self-care as needed.  Patient prefers to sleep in recliner/chair, but has had episodes recently of sliding out of chair.   Frequency  Min 2X/week        Progress Toward Goals  OT Goals(current goals can now be found in the care plan section)  Progress towards OT goals: Progressing toward goals  Acute Rehab OT Goals Patient Stated Goal: per daughter, to walk OT Goal Formulation: Patient unable to participate in goal setting Time For Goal Achievement: 07/23/21 Potential to Achieve Goals: Good ADL Goals Pt Will Perform Grooming: with supervision Pt Will Perform Lower Body Dressing: with supervision Pt Will Transfer to Toilet: with supervision Pt Will Perform Toileting - Clothing Manipulation and hygiene: with supervision  Plan Discharge plan remains appropriate;Frequency remains appropriate    Co-evaluation    PT/OT/SLP Co-Evaluation/Treatment: Yes Reason  for Co-Treatment: Complexity of the patient's impairments (multi-system involvement);For patient/therapist safety;To address functional/ADL transfers PT goals addressed during session: Mobility/safety with mobility OT goals addressed during session: ADL's and self-care      AM-PAC OT "6 Clicks" Daily Activity     Outcome Measure   Help from another person eating meals?: A Little Help from another person taking care of personal grooming?: A Little Help from another person toileting, which includes using toliet, bedpan, or urinal?: A Lot Help from another person bathing (including washing, rinsing, drying)?: A Lot Help from another person to put on and taking off regular upper body clothing?: A Lot Help from another person to put on and taking off regular lower body clothing?: A Lot 6 Click Score: 14    End of Session Equipment Utilized During Treatment: Gait belt;Rolling walker  OT Visit Diagnosis: Unsteadiness on feet (R26.81)   Activity Tolerance Patient tolerated treatment well;Other (comment) (limited by cognition)  Patient Left in bed;with call bell/phone within reach;Other (comment);with family/visitor present (B hand mittens donned)   Nurse Communication Mobility status        Time: 0518-3358 OT Time Calculation (min): 19 min  Charges: OT General Charges $OT Visit: 1 Visit OT Treatments $Self Care/Home Management : 8-22 mins  Arman Filter., MPH, MS, OTR/L ascom [phone] 07/09/21, 2:23 PM

## 2021-07-09 NOTE — Evaluation (Signed)
Physical Therapy Evaluation Patient Details Name: Leah Ritter MRN: [id number] DOB: May 08, 1951 Today's Date: 07/09/2021  History of Present Illness  70 y.o. female with medical history significant for Dementia per chart notes needing assistance w/ ADLs at NH, hypertension, hyperlipidemia, diabetes mellitus, stroke, depression, anxiety, OSA not on CPAP, CHF, atrial fibrillation on Xarelto, CKD-3B, who presents with altered mental status, nausea, vomiting, possible aspiration of vomit.  Pt's previous admit in 04/2021 for AKI.  CXR at admit: Cardiomegaly with unchanged to slightly increased pulmonary vascular  congestion/mild edema.  Clinical Impression  Patient sleeping soundly upon arrival to room; daughter at bedside.  Max facilitation for patient alertness, ultimately facilitated by transition to unsupported sitting edge of bed.  Patient mumbles responses to therapist questions/conversation, but speech largely tangential/off topic with limited ability to effectively communicate/converse.  Patient generally resistant to assisted movement of all extremities, but does demonstrate fair/good strength throughout bilat UE/LEs; no obvious, focal weakness appreciated.  Currently requiring max/total assist for bed mobility; close sup for unsupported sitting; mod/max assist +2 for sit/stand; min/mod assist for static sitting balance.  Does demonstrate mild R lateral lean with standing activities, min assist to correct/maintain balance.  Patient does take 3 steps towards L at edge of bed, mod assist +1; otherwise, generally resistive to stepping/gait attempts. Of note, do anticipate significant improvement in functional ability and performance should patient spontaneously initiate movement and/or initiate tasks from a more alert state.  Will continue to monitor/assess in subsequent sessions. Would benefit from skilled PT to address above deficits and promote optimal return to PLOF.; recommend return to memory care  with continued support/assist from facility staff as needed once patient medically ready for discharge.     Recommendations for follow up therapy are one component of a multi-disciplinary discharge planning process, led by the attending physician.  Recommendations may be updated based on patient status, additional functional criteria and insurance authorization.  Follow Up Recommendations  (return to memory care with assist from staff as needed)    Equipment Recommendations       Recommendations for Other Services       Precautions / Restrictions Precautions Precautions: Fall Restrictions Weight Bearing Restrictions: No      Mobility  Bed Mobility Overal bed mobility: Needs Assistance Bed Mobility: Supine to Sit;Sit to Supine     Supine to sit: Max assist;Total assist Sit to supine: Max assist;Total assist   General bed mobility comments: generally resistant to movement patterns; do anticipate significant improvement in ability and performance should patient spontaneously initiate movement    Transfers Overall transfer level: Needs assistance Equipment used: 2 person hand held assist Transfers: Sit to/from Stand Sit to Stand: Max assist;+2 physical assistance Stand pivot transfers: Min guard;Min assist       General transfer comment: extensive assist to initiate sit/stand, patient resisting at times. Once upright and accomodated to position, does maintain static standing with min assist +1 (slight R lean at times)  Ambulation/Gait             General Gait Details: patient resistant/refused gait attempts  Stairs            Wheelchair Mobility    Modified Rankin (Stroke Patients Only)       Balance Overall balance assessment: Needs assistance Sitting-balance support: No upper extremity supported;Feet supported Sitting balance-Leahy Scale: Good     Standing balance support: Bilateral upper extremity supported Standing balance-Leahy Scale: Poor  Pertinent Vitals/Pain Pain Assessment: Faces Faces Pain Scale: No hurt    Home Living Family/patient expects to be discharged to:: Assisted living               Home Equipment: Walker - 4 wheels Additional Comments: Pt with baseline dementia and per chart she lives at [facility].    Prior Function Level of Independence: Needs assistance   Gait / Transfers Assistance Needed: Amb household distances 4WW  ADL's / Homemaking Assistance Needed: Requires assist  Comments: Per daughter, staff assists with ADLs, self-care as needed.  Patient prefers to sleep in recliner/chair, but has had episodes recently of sliding out of chair.     Hand Dominance   Dominant Hand: Right    Extremity/Trunk Assessment   Upper Extremity Assessment Upper Extremity Assessment: Overall WFL for tasks assessed (patient generally resistant to isolated movement of extremities; functionally, at least 4/5)    Lower Extremity Assessment Lower Extremity Assessment: Overall WFL for tasks assessed (patient generally resistant to isolated movement of extremities; functionally, at least 4/5)       Communication   Communication:  (Patient generally mumbled in speech; unable to accurately answer questions or maintain simple conversation.)  Cognition Arousal/Alertness: Lethargic Behavior During Therapy: Flat affect Overall Cognitive Status: History of cognitive impairments - at baseline                                 General Comments: Pt with very tangential speech and follows commands 25% of the time with mod - max multimodal cuing. Max cuing for alertness during session; did improve once upright at edge of bed      General Comments      Exercises Other Exercises Other Exercises: Sit/stand x2 with bilat HHA, mod/max assist +2 for sit/stand; min/mod assist for static sitting balance.  Does attempt spontaneous sitting multiple times through  session; constant redirection to task for safety. Other Exercises: Lateral stepping edge of bed (3 steps towards L), mod assist for walker management, weight shift, dynamic balance; resisted/refused further gait attempts.   Assessment/Plan    PT Assessment Patient needs continued PT services  PT Problem List Decreased strength;Decreased balance;Decreased activity tolerance;Decreased mobility;Decreased cognition;Decreased knowledge of use of DME;Decreased safety awareness;Decreased knowledge of precautions       PT Treatment Interventions DME instruction;Gait training;Functional mobility training;Therapeutic activities;Therapeutic exercise;Patient/family education;Cognitive remediation    PT Goals (Current goals can be found in the Care Plan section)  Acute Rehab PT Goals Patient Stated Goal: per daughter, to walk PT Goal Formulation: With patient/family Time For Goal Achievement: 07/23/21 Potential to Achieve Goals: Fair    Frequency Min 2X/week   Barriers to discharge        Co-evaluation PT/OT/SLP Co-Evaluation/Treatment: Yes Reason for Co-Treatment: Complexity of the patient's impairments (multi-system involvement);For patient/therapist safety;To address functional/ADL transfers PT goals addressed during session: Mobility/safety with mobility         AM-PAC PT "6 Clicks" Mobility  Outcome Measure Help needed turning from your back to your side while in a flat bed without using bedrails?: A Lot Help needed moving from lying on your back to sitting on the side of a flat bed without using bedrails?: A Lot Help needed moving to and from a bed to a chair (including a wheelchair)?: A Lot Help needed standing up from a chair using your arms (e.g., wheelchair or bedside chair)?: A Lot Help needed to walk in hospital  room?: A Lot Help needed climbing 3-5 steps with a railing? : Total 6 Click Score: 11    End of Session Equipment Utilized During Treatment: Gait belt Activity  Tolerance: Patient limited by lethargy Patient left: in bed;with call bell/phone within reach;with bed alarm set;with family/visitor present Nurse Communication: Mobility status PT Visit Diagnosis: Muscle weakness (generalized) (M62.81);Difficulty in walking, not elsewhere classified (R26.2)    Time: 9024-0973 PT Time Calculation (min) (ACUTE ONLY): 39 min   Charges:   PT Evaluation $PT Eval Moderate Complexity: 1 Mod PT Treatments $Therapeutic Activity: 8-22 mins        Hollis Oh H. Manson Passey, PT, DPT, NCS 07/09/21, 1:40 PM [phone]

## 2021-07-09 NOTE — Plan of Care (Signed)
  Problem: Education: Goal: Knowledge of General Education information will improve Description: Including pain rating scale, medication(s)/side effects and non-pharmacologic comfort measures Outcome: Progressing   Problem: Clinical Measurements: Goal: Ability to maintain clinical measurements within normal limits will improve Outcome: Progressing   

## 2021-07-09 NOTE — Evaluation (Signed)
Occupational Therapy Evaluation Patient Details Name: Leah Ritter MRN: [id number] DOB: 08-18-1951 Today's Date: 07/09/2021   History of Present Illness 70 y.o. female with medical history significant for Dementia per chart notes needing assistance w/ ADLs at NH, hypertension, hyperlipidemia, diabetes mellitus, stroke, depression, anxiety, OSA not on CPAP, CHF, atrial fibrillation on Xarelto, CKD-3B, who presents with altered mental status, nausea, vomiting, possible aspiration of vomit.  Pt's previous admit in 04/2021 for AKI.  CXR at admit: Cardiomegaly with unchanged to slightly increased pulmonary vascular  congestion/mild edema.   Clinical Impression   No family present during evaluation.Per chart report, patient lives at in a memory care unit ALF. At baseline, she is verbal and needs assistance for self care and safe mobility. Pt with very tangential speech this session. She follows 25% of commands with increased time and mod multimodal cuing. Pt performs bed mobility with supervision and stands with min A. She then begins to urinate on the floor and also noted to be having BM. She will not follow commands to sit on BSC and needs max encouragement to return to bed once linens changed. Pt placed on bed pan in bed with B hand mits donned. RN present in the room to assist pt. Patient will benefit from acute OT in order to encourage/maintain current level of function. Pt is likely close to baseline and OT does not recommend follow up at discharge.      Recommendations for follow up therapy are one component of a multi-disciplinary discharge planning process, led by the attending physician.  Recommendations may be updated based on patient status, additional functional criteria and insurance authorization.   Follow Up Recommendations  No OT follow up;Other (comment) (return to memory care at ALF)    Equipment Recommendations  None recommended by OT       Precautions / Restrictions  Precautions Precautions: Fall Restrictions Weight Bearing Restrictions: No      Mobility Bed Mobility Overal bed mobility: Needs Assistance             General bed mobility comments: supervision overall with cuing and increased time. No physical assistance provided.    Transfers Overall transfer level: Needs assistance Equipment used: 1 person hand held assist Transfers: Sit to/from [employer] Sit to Stand: Min guard;Min assist Stand pivot transfers: Min guard;Min assist       General transfer comment: Pt becomes upset and yells , "don't touch me" if therapist attempts to assist. OT only steadying pt when she appears to have LOB.    Balance Overall balance assessment: Needs assistance Sitting-balance support: Feet supported Sitting balance-Leahy Scale: Good     Standing balance support: During functional activity Standing balance-Leahy Scale: Poor                             ADL either performed or assessed with clinical judgement   ADL Overall ADL's : Needs assistance/impaired                                       General ADL Comments: Pt needs assistance but is likely close to baseline for needs. Pt unable to follow commands well for safety and toileting needs secondary to cognition. She urinates in the floor and begins having bowel movement but refuses to sit on BSC.     Vision Patient Visual Report: No change from  baseline              Pertinent Vitals/Pain Pain Assessment: Faces Faces Pain Scale: No hurt     Hand Dominance Right   Extremity/Trunk Assessment Upper Extremity Assessment Upper Extremity Assessment: Overall WFL for tasks assessed   Lower Extremity Assessment Lower Extremity Assessment: Defer to PT evaluation       Communication Communication Communication:  (very tangential speech)   Cognition   Behavior During Therapy: Restless;Flat affect Overall Cognitive Status: History of  cognitive impairments - at baseline                                 General Comments: Pt with very tangential speech and follows commands 25% of the time with mod - max multimodal cuing.              Home Living Family/patient expects to be discharged to:: Assisted living                                 Additional Comments: Pt with baseline dementia and per chart she lives at [facility].      Prior Functioning/Environment Level of Independence: Needs assistance        Comments: per chart, pt needs assist for self care tasks and safety with mobility        OT Problem List: Decreased strength;Decreased safety awareness;Impaired balance (sitting and/or standing);Decreased activity tolerance;Decreased knowledge of precautions;Decreased knowledge of use of DME or AE      OT Treatment/Interventions: Self-care/ADL training;Therapeutic exercise;Therapeutic activities;Cognitive remediation/compensation;Energy conservation;Patient/family education;DME and/or AE instruction;Balance training;Manual therapy    OT Goals(Current goals can be found in the care plan section) Acute Rehab OT Goals Patient Stated Goal: none stated OT Goal Formulation: Patient unable to participate in goal setting Time For Goal Achievement: 07/23/21 Potential to Achieve Goals: Good ADL Goals Pt Will Perform Grooming: with supervision Pt Will Perform Lower Body Dressing: with supervision Pt Will Transfer to Toilet: with supervision Pt Will Perform Toileting - Clothing Manipulation and hygiene: with supervision  OT Frequency: Min 2X/week   Barriers to D/C:    none known at this time          AM-PAC OT "6 Clicks" Daily Activity     Outcome Measure Help from another person eating meals?: A Little Help from another person taking care of personal grooming?: A Little Help from another person toileting, which includes using toliet, bedpan, or urinal?: A Little Help from  another person bathing (including washing, rinsing, drying)?: A Little Help from another person to put on and taking off regular upper body clothing?: A Little Help from another person to put on and taking off regular lower body clothing?: A Little 6 Click Score: 18   End of Session Nurse Communication: Mobility status;Other (comment) (BM and safety concerns)  Activity Tolerance: Patient tolerated treatment well;Other (comment) (limited secondary to cognition) Patient left: in bed;with call bell/phone within reach;with nursing/sitter in room;Other (comment) (B hand mittens donned)  OT Visit Diagnosis: Unsteadiness on feet (R26.81)                Time: 7169-6789 OT Time Calculation (min): 21 min Charges:  OT General Charges $OT Visit: 1 Visit OT Evaluation $OT Eval Moderate Complexity: 1 Mod OT Treatments $Self Care/Home Management : 8-22 mins  Jackquline Denmark, MS, OTR/L , CBIS ascom [phone]  07/09/21, 12:36 PM

## 2021-07-09 NOTE — Consult Note (Signed)
[facility] Face-to-Face Psychiatry Consult   Reason for Consult: Consult for 70 year old woman in the hospital with altered mental status and lethargy.  Question about altered mental status Referring Physician: Lucianne Muss Patient Identification: Kimla Furth MRN:  [id number] Principal Diagnosis: Acute metabolic encephalopathy Diagnosis:  Principal Problem:   Acute metabolic encephalopathy Active Problems:   HLD (hyperlipidemia)   Hypothermia   Type II diabetes mellitus with renal manifestations (HCC)   Stroke (HCC)   Hypertension   A-fib (HCC)   Chronic diastolic CHF (congestive heart failure) (HCC)   Depression with anxiety   CKD (chronic kidney disease), stage IIIb   Aspiration pneumonia (HCC)   Nausea & vomiting   Total Time spent with patient: 1 hour  Subjective:   Terra Aveni is a 69 y.o. female patient admitted with "[name]".  HPI: Patient seen chart reviewed.  70 year old woman with a known diagnosis of dementia brought to the hospital because of a decline in mental status.  Patient was unresponsive when she first presented to the hospital.  Has been treated for sepsis and now seems to be recovering.  Patient was awake sitting up in her bed.  Gazing out the window she was vaguely making comments about the cars going by on the road in the distance.  Asked me what road it was.  Said she could see them straightaway going over the bridge.  She was able to tell me her name.  Could not answer any other questions directly.  Followed minimal commands.  Nursing staff present said that she had recognized her daughters when they came today and had been following some commands and interacting earlier.  Patient is not currently being given the alprazolam more quetiapine that were listed in her medications prior to admission.  Past Psychiatric History: Only past known history as a diagnosis of dementia.  Old chart indicates that recent baseline for this patient was that she knew who her daughters  were but had minimal other interaction beyond that and frequently could not answer questions.  No other known past psychiatric history.  Risk to Self:   Risk to Others:   Prior Inpatient Therapy:   Prior Outpatient Therapy:    Past Medical History:  Past Medical History:  Diagnosis Date   A-fib (HCC)    Anxiety    CHF (congestive heart failure) (HCC)    Diabetes (HCC)    GERD (gastroesophageal reflux disease)    Hypertension    Sleep apnea    Stroke [facility])     Past Surgical History:  Procedure Laterality Date   PARTIAL HYSTERECTOMY     tubligation     Family History:  Family History  Problem Relation Age of Onset   Breast cancer Mother    Stroke Father    Hypertension Father    Kidney disease Brother    Cancer Brother    Family Psychiatric  History: None reported Social History:  Social History   Substance and Sexual Activity  Alcohol Use Not Currently     Social History   Substance and Sexual Activity  Drug Use Never    Social History   Socioeconomic History   Marital status: Single    Spouse name: Not on file   Number of children: Not on file   Years of education: Not on file   Highest education level: Not on file  Occupational History   Not on file  Tobacco Use   Smoking status: Never   Smokeless tobacco: Never  Vaping Use  Vaping Use: Never used  Substance and Sexual Activity   Alcohol use: Not Currently   Drug use: Never   Sexual activity: Not Currently  Other Topics Concern   Not on file  Social History Narrative   Not on file   Social Determinants of Health   Financial Resource Strain: Not on file  Food Insecurity: Not on file  Transportation Needs: Not on file  Physical Activity: Not on file  Stress: Not on file  Social Connections: Not on file   Additional Social History:    Allergies:   Allergies  Allergen Reactions   Oxycodone-Acetaminophen Nausea And Vomiting    Other reaction(s): Vomiting   Ezetimibe Other (See  Comments)    Reaction unknown   Lisinopril Other (See Comments)    Reaction unknown   Statins Other (See Comments)    Reaction unknown     Labs:  Results for orders placed or performed during the hospital encounter of 07/06/21 (from the past 48 hour(s))  Glucose, capillary     Status: Abnormal   Collection Time: 07/07/21  6:07 PM  Result Value Ref Range   Glucose-Capillary 283 (H) 70 - 99 mg/dL    Comment: Glucose reference range applies only to samples taken after fasting for at least 8 hours.  Glucose, capillary     Status: Abnormal   Collection Time: 07/07/21 10:40 PM  Result Value Ref Range   Glucose-Capillary 167 (H) 70 - 99 mg/dL    Comment: Glucose reference range applies only to samples taken after fasting for at least 8 hours.  Procalcitonin     Status: None   Collection Time: 07/08/21  5:25 AM  Result Value Ref Range   Procalcitonin <0.10 ng/mL    Comment:        Interpretation: PCT (Procalcitonin) <= 0.5 ng/mL: Systemic infection (sepsis) is not likely. Local bacterial infection is possible. (NOTE)       Sepsis PCT Algorithm           Lower Respiratory Tract                                      Infection PCT Algorithm    ----------------------------     ----------------------------         PCT < 0.25 ng/mL                PCT < 0.10 ng/mL          Strongly encourage             Strongly discourage   discontinuation of antibiotics    initiation of antibiotics    ----------------------------     -----------------------------       PCT 0.25 - 0.50 ng/mL            PCT 0.10 - 0.25 ng/mL               OR       >80% decrease in PCT            Discourage initiation of                                            antibiotics      Encourage discontinuation           of  antibiotics    ----------------------------     -----------------------------         PCT >= 0.50 ng/mL              PCT 0.26 - 0.50 ng/mL               AND        <80% decrease in PCT              Encourage initiation of                                             antibiotics       Encourage continuation           of antibiotics    ----------------------------     -----------------------------        PCT >= 0.50 ng/mL                  PCT > 0.50 ng/mL               AND         increase in PCT                  Strongly encourage                                      initiation of antibiotics    Strongly encourage escalation           of antibiotics                                     -----------------------------                                           PCT <= 0.25 ng/mL                                                 OR                                        > 80% decrease in PCT                                      Discontinue / Do not initiate                                             antibiotics  Performed at [facility], [address] [place], [place] [address]   CBC with Differential/Platelet     Status: Abnormal   Collection Time: 07/08/21  5:25 AM  Result Value Ref Range   WBC 9.0 4.0 - 10.5 K/uL  RBC 3.38 (L) 3.87 - 5.11 MIL/uL   Hemoglobin 10.0 (L) 12.0 - 15.0 g/dL   HCT 09.8 (L) 11.9 - 14.7 %   MCV 88.8 80.0 - 100.0 fL   MCH 29.6 26.0 - 34.0 pg   MCHC 33.3 30.0 - 36.0 g/dL   RDW 82.9 56.2 - 13.0 %   Platelets 238 150 - 400 K/uL   nRBC 0.0 0.0 - 0.2 %   Neutrophils Relative % 70 %   Neutro Abs 6.2 1.7 - 7.7 K/uL   Lymphocytes Relative 18 %   Lymphs Abs 1.6 0.7 - 4.0 K/uL   Monocytes Relative 10 %   Monocytes Absolute 0.9 0.1 - 1.0 K/uL   Eosinophils Relative 2 %   Eosinophils Absolute 0.2 0.0 - 0.5 K/uL   Basophils Relative 0 %   Basophils Absolute 0.0 0.0 - 0.1 K/uL   Immature Granulocytes 0 %   Abs Immature Granulocytes 0.03 0.00 - 0.07 K/uL    Comment: Performed at [facility], [address] [place], [place] [address]  Comprehensive metabolic panel     Status: Abnormal   Collection Time: 07/08/21  5:25 AM  Result Value  Ref Range   Sodium 138 135 - 145 mmol/L   Potassium 4.1 3.5 - 5.1 mmol/L   Chloride 105 98 - 111 mmol/L   CO2 25 22 - 32 mmol/L   Glucose, Bld 136 (H) 70 - 99 mg/dL    Comment: Glucose reference range applies only to samples taken after fasting for at least 8 hours.   BUN 20 8 - 23 mg/dL   Creatinine, Ser 4.69 (H) 0.44 - 1.00 mg/dL   Calcium 8.5 (L) 8.9 - 10.3 mg/dL   Total Protein 6.6 6.5 - 8.1 g/dL   Albumin 2.5 (L) 3.5 - 5.0 g/dL   AST 28 15 - 41 U/L   ALT 35 0 - 44 U/L   Alkaline Phosphatase 93 38 - 126 U/L   Total Bilirubin 0.7 0.3 - 1.2 mg/dL   GFR, Estimated 56 (L) >60 mL/min    Comment: (NOTE) Calculated using the CKD-EPI Creatinine Equation (2021)    Anion gap 8 5 - 15    Comment: Performed at [facility], [address], [place], [place] [address]  Magnesium     Status: None   Collection Time: 07/08/21  5:25 AM  Result Value Ref Range   Magnesium 1.7 1.7 - 2.4 mg/dL    Comment: Performed at [facility], [address] [place], [place] [address]  Phosphorus     Status: None   Collection Time: 07/08/21  5:25 AM  Result Value Ref Range   Phosphorus 2.7 2.5 - 4.6 mg/dL    Comment: Performed at [facility], [address] [place], [place] [address]  Glucose, capillary     Status: Abnormal   Collection Time: 07/08/21  7:36 AM  Result Value Ref Range   Glucose-Capillary 142 (H) 70 - 99 mg/dL    Comment: Glucose reference range applies only to samples taken after fasting for at least 8 hours.  Glucose, capillary     Status: Abnormal   Collection Time: 07/08/21 11:07 AM  Result Value Ref Range   Glucose-Capillary 200 (H) 70 - 99 mg/dL    Comment: Glucose reference range applies only to samples taken after fasting for at least 8 hours.  Glucose, capillary     Status: Abnormal   Collection Time: 07/08/21  3:18 PM  Result Value Ref  Range   Glucose-Capillary 192 (H) 70 - 99 mg/dL    Comment: Glucose reference range applies only to samples  taken after fasting for at least 8 hours.  Vitamin B12     Status: None   Collection Time: 07/08/21  4:14 PM  Result Value Ref Range   Vitamin B-12 872 180 - 914 pg/mL    Comment: (NOTE) This assay is not validated for testing neonatal or myeloproliferative syndrome specimens for Vitamin B12 levels. Performed at [facility], [address], [place], [place] [address]   Folate     Status: None   Collection Time: 07/08/21  4:14 PM  Result Value Ref Range   Folate 12.3 >5.9 ng/mL    Comment: Performed at [facility], [address] [place], [place] [address]  Iron and TIBC     Status: None   Collection Time: 07/08/21  4:14 PM  Result Value Ref Range   Iron 35 28 - 170 ug/dL   TIBC 948 546 - 270 ug/dL   Saturation Ratios 13 10.4 - 31.8 %   UIBC 235 ug/dL    Comment: Performed at [facility], [address] [place], [place] [address]  VITAMIN D 25 Hydroxy (Vit-D Deficiency, Fractures)     Status: None   Collection Time: 07/08/21  4:14 PM  Result Value Ref Range   Vit D, 25-Hydroxy 56.57 30 - 100 ng/mL    Comment: (NOTE) Vitamin D deficiency has been defined by the Institute of Medicine  and an Endocrine Society practice guideline as a level of serum 25-OH  vitamin D less than 20 ng/mL (1,2). The Endocrine Society went on to  further define vitamin D insufficiency as a level between 21 and 29  ng/mL (2).  1. IOM (Institute of Medicine). 2010. Dietary reference intakes for  calcium and D. [place]: [employer]. 2. Holick MF, Binkley , Bischoff-Ferrari HA, et al. Evaluation,  treatment, and prevention of vitamin D deficiency: an Endocrine  Society clinical practice guideline, JCEM. 2011 Jul; 96(7): 1911-30.  Performed at [facility], [address] [place], [place] [address]   Glucose, capillary     Status: Abnormal   Collection Time: 07/08/21  9:10 PM  Result Value Ref Range   Glucose-Capillary 142 (H) 70 - 99  mg/dL    Comment: Glucose reference range applies only to samples taken after fasting for at least 8 hours.  CBC     Status: Abnormal   Collection Time: 07/09/21  6:44 AM  Result Value Ref Range   WBC 8.7 4.0 - 10.5 K/uL   RBC 3.55 (L) 3.87 - 5.11 MIL/uL   Hemoglobin 10.5 (L) 12.0 - 15.0 g/dL   HCT 99.3 (L) 71.6 - 96.7 %   MCV 89.3 80.0 - 100.0 fL   MCH 29.6 26.0 - 34.0 pg   MCHC 33.1 30.0 - 36.0 g/dL   RDW 89.3 81.0 - 17.5 %   Platelets 233 150 - 400 K/uL   nRBC 0.0 0.0 - 0.2 %    Comment: Performed at [facility], [address], [place], [place] [address]  Basic metabolic panel     Status: Abnormal   Collection Time: 07/09/21  6:44 AM  Result Value Ref Range   Sodium 139 135 - 145 mmol/L   Potassium 4.3 3.5 - 5.1 mmol/L   Chloride 106 98 - 111 mmol/L   CO2 26 22 - 32 mmol/L   Glucose, Bld  139 (H) 70 - 99 mg/dL    Comment: Glucose reference range applies only to samples taken after fasting for at least 8 hours.   BUN 20 8 - 23 mg/dL   Creatinine, Ser 1.61 0.44 - 1.00 mg/dL   Calcium 9.1 8.9 - 09.6 mg/dL   GFR, Estimated >04 >54 mL/min    Comment: (NOTE) Calculated using the CKD-EPI Creatinine Equation (2021)    Anion gap 7 5 - 15    Comment: Performed at [facility], [address], [place], [place] [address]  Magnesium     Status: None   Collection Time: 07/09/21  6:44 AM  Result Value Ref Range   Magnesium 2.0 1.7 - 2.4 mg/dL    Comment: Performed at [facility], [address] [place], [place] [address]  Phosphorus     Status: None   Collection Time: 07/09/21  6:44 AM  Result Value Ref Range   Phosphorus 3.4 2.5 - 4.6 mg/dL    Comment: Performed at [facility], [address] [place], [place] [address]  Glucose, capillary     Status: Abnormal   Collection Time: 07/09/21  7:23 AM  Result Value Ref Range   Glucose-Capillary 127 (H) 70 - 99 mg/dL    Comment: Glucose reference range applies only to samples taken after  fasting for at least 8 hours.  Glucose, capillary     Status: Abnormal   Collection Time: 07/09/21 11:26 AM  Result Value Ref Range   Glucose-Capillary 224 (H) 70 - 99 mg/dL    Comment: Glucose reference range applies only to samples taken after fasting for at least 8 hours.  Glucose, capillary     Status: Abnormal   Collection Time: 07/09/21  3:52 PM  Result Value Ref Range   Glucose-Capillary 272 (H) 70 - 99 mg/dL    Comment: Glucose reference range applies only to samples taken after fasting for at least 8 hours.    Current Facility-Administered Medications  Medication Dose Route Frequency Provider Last Rate Last Admin   acetaminophen (TYLENOL) suppository 650 mg  650 mg Rectal Q6H PRN Lorretta Harp, MD       albuterol (PROVENTIL) (2.5 MG/3ML) 0.083% nebulizer solution 2.5 mg  2.5 mg Nebulization Q4H PRN Lorretta Harp, MD       Ampicillin-Sulbactam (UNASYN) 3 g in sodium chloride 0.9 % 100 mL IVPB  3 g Intravenous Q6H Ronnald Ramp, RPH 200 mL/hr at 07/09/21 1658 3 g at 07/09/21 1658   aspirin EC tablet 81 mg  81 mg Oral Daily Lorretta Harp, MD   81 mg at 07/09/21 0954   atorvastatin (LIPITOR) tablet 80 mg  80 mg Oral q1800 Lorretta Harp, MD   80 mg at 07/09/21 1648   bisacodyl (DULCOLAX) EC tablet 10 mg  10 mg Oral Daily PRN Gillis Santa, MD   10 mg at 07/09/21 1228   bisacodyl (DULCOLAX) suppository 10 mg  10 mg Rectal Daily PRN Gillis Santa, MD       cholecalciferol (VITAMIN D3) tablet 2,000 Units  2,000 Units Oral Leo Grosser, MD   2,000 Units at 07/08/21 1308   dextromethorphan-guaiFENesin (MUCINEX DM) 30-600 MG per 12 hr tablet 1 tablet  1 tablet Oral BID PRN Lorretta Harp, MD       feeding supplement (ENSURE ENLIVE / ENSURE PLUS) liquid 237 mL  237 mL Oral TID BM Sheikh, Omair Latif, DO   237 mL at 07/09/21 1311   guaiFENesin (ROBITUSSIN) 100 MG/5ML solution  400 mg  20 mL Oral Q4H Sheikh, Kateri Mc [place], DO   400 mg at 07/09/21 1648   hydrALAZINE (APRESOLINE) injection 5 mg  5 mg  Intravenous Q2H PRN Lorretta Harp, MD       insulin aspart (novoLOG) injection 0-5 Units  0-5 Units Subcutaneous QHS Lorretta Harp, MD       insulin aspart (novoLOG) injection 0-9 Units  0-9 Units Subcutaneous TID WC Lorretta Harp, MD   5 Units at 07/09/21 1649   iohexol (OMNIPAQUE) 9 MG/ML oral solution 500 mL  500 mL Oral Once PRN Lorretta Harp, MD       metoprolol succinate (TOPROL-XL) 24 hr tablet 150 mg  150 mg Oral Daily Lorretta Harp, MD   150 mg at 07/09/21 0954   mirabegron ER (MYRBETRIQ) tablet 25 mg  25 mg Oral Daily Lorretta Harp, MD   25 mg at 07/09/21 8588   multivitamin with minerals tablet 1 tablet  1 tablet Oral Daily Gillis Santa, MD   1 tablet at 07/09/21 0955   ondansetron (ZOFRAN) injection 4 mg  4 mg Intravenous Once Lorretta Harp, MD       ondansetron [facility]) injection 4 mg  4 mg Intravenous Q8H PRN Lorretta Harp, MD       polyethylene glycol (MIRALAX / GLYCOLAX) packet 17 g  17 g Oral Daily Gillis Santa, MD   17 g at 07/09/21 1311   rivaroxaban (XARELTO) tablet 20 mg  20 mg Oral Q supper Lorretta Harp, MD   20 mg at 07/09/21 1648    Musculoskeletal: Strength & Muscle Tone: within normal limits Gait & Station: unsteady Patient leans: N/A            Psychiatric Specialty Exam:  Presentation  General Appearance:  No data recorded Eye Contact: No data recorded Speech: No data recorded Speech Volume: No data recorded Handedness: No data recorded  Mood and Affect  Mood: No data recorded Affect: No data recorded  Thought Process  Thought Processes: No data recorded Descriptions of Associations:No data recorded Orientation:No data recorded Thought Content:No data recorded History of Schizophrenia/Schizoaffective disorder:No data recorded Duration of Psychotic Symptoms:No data recorded Hallucinations:No data recorded Ideas of Reference:No data recorded Suicidal Thoughts:No data recorded Homicidal Thoughts:No data recorded  Sensorium  Memory: No data  recorded Judgment: No data recorded Insight: No data recorded  Executive Functions  Concentration: No data recorded Attention Span: No data recorded Recall: No data recorded Fund of Knowledge: No data recorded Language: No data recorded  Psychomotor Activity  Psychomotor Activity: No data recorded  Assets  Assets: No data recorded  Sleep  Sleep: No data recorded  Physical Exam: Physical Exam Vitals and nursing note reviewed.  Constitutional:      Appearance: Normal appearance.  HENT:     Head: Normocephalic and atraumatic.     Mouth/Throat:     Pharynx: Oropharynx is clear.  Eyes:     Pupils: Pupils are equal, round, and reactive to light.  Cardiovascular:     Rate and Rhythm: Normal rate and regular rhythm.  Pulmonary:     Effort: Pulmonary effort is normal.     Breath sounds: Normal breath sounds.  Abdominal:     General: Abdomen is flat.     Palpations: Abdomen is soft.  Musculoskeletal:        General: Normal range of motion.  Skin:    General: Skin is warm and dry.  Neurological:     General: No focal deficit present.  Mental Status: She is alert. Mental status is at baseline.  Psychiatric:        Attention and Perception: She is inattentive.        Mood and Affect: Affect is blunt.        Speech: She is noncommunicative.        Behavior: Behavior is slowed.        Cognition and Memory: Cognition is impaired.   Review of Systems  Unable to perform ROS: Dementia  Blood pressure (!) 114/52, pulse 73, temperature 97.8 F (36.6 C), temperature source Oral, resp. rate 18, height 5\' 10"  (1.778 m), weight 98.4 kg, SpO2 100 %. Body mass index is 31.13 kg/m.  Treatment Plan Summary: Plan 70 year old woman with dementia.  Based on what I could find in the chart she appears to be after nearly at her baseline right now.  She is verbal and responsive.  Needs assistance with all ADLs including eating but eats when fed.  Not acting out not aggressive no  sign of withdrawal.  Generally benzodiazepines are contraindicated and if she does not need the Seroquel she is better off without it.  Would not recommend restarting any of those medicines at this point.  We will follow as needed.  Disposition: Patient does not meet criteria for psychiatric inpatient admission. Supportive therapy provided about ongoing stressors.  Mordecai Rasmussen, MD 07/09/2021 5:18 PM

## 2021-07-10 DIAGNOSIS — G9341 Metabolic encephalopathy: Secondary | ICD-10-CM

## 2021-07-10 LAB — MAGNESIUM: Magnesium: 2 mg/dL (ref 1.7–2.4)

## 2021-07-10 LAB — GLUCOSE, CAPILLARY
Glucose-Capillary: 159 mg/dL — ABNORMAL HIGH (ref 70–99)
Glucose-Capillary: 147 mg/dL — ABNORMAL HIGH (ref 70–99)
Glucose-Capillary: 256 mg/dL — ABNORMAL HIGH (ref 70–99)
Glucose-Capillary: 220 mg/dL — ABNORMAL HIGH (ref 70–99)

## 2021-07-10 LAB — CBC
HCT: 31.6 % — ABNORMAL LOW (ref 36.0–46.0)
Hemoglobin: 10.5 g/dL — ABNORMAL LOW (ref 12.0–15.0)
MCH: 29.6 pg (ref 26.0–34.0)
MCHC: 33.2 g/dL (ref 30.0–36.0)
MCV: 89 fL (ref 80.0–100.0)
Platelets: 238 10*3/uL (ref 150–400)
RBC: 3.55 MIL/uL — ABNORMAL LOW (ref 3.87–5.11)
RDW: 14.1 % (ref 11.5–15.5)
WBC: 8.1 10*3/uL (ref 4.0–10.5)
nRBC: 0 % (ref 0.0–0.2)

## 2021-07-10 LAB — BASIC METABOLIC PANEL
Anion gap: 8 (ref 5–15)
BUN: 20 mg/dL (ref 8–23)
CO2: 25 mmol/L (ref 22–32)
Calcium: 9 mg/dL (ref 8.9–10.3)
Chloride: 106 mmol/L (ref 98–111)
Creatinine, Ser: 1.05 mg/dL — ABNORMAL HIGH (ref 0.44–1.00)
GFR, Estimated: 57 mL/min — ABNORMAL LOW (ref >60)
Glucose, Bld: 153 mg/dL — ABNORMAL HIGH (ref 70–99)
Potassium: 4.3 mmol/L (ref 3.5–5.1)
Sodium: 139 mmol/L (ref 135–145)

## 2021-07-10 LAB — PHOSPHORUS: Phosphorus: 3.6 mg/dL (ref 2.5–4.6)

## 2021-07-10 MED ORDER — AMLODIPINE BESYLATE 5 MG PO TABS
5.0000 mg | ORAL_TABLET | Freq: Every day | ORAL | Status: DC
  Administered 2021-07-10 – 2021-07-14 (×5): 5 mg via ORAL
  Filled 2021-07-10 (×5): qty 1

## 2021-07-10 NOTE — TOC Progression Note (Addendum)
Transition of Care [facility]) - Progression Note   Patient Details  Name: Leah Ritter MRN: [id number] Date of Birth: 07/12/1951  [facility] CM/SW Contact  Liliana Cline, LCSW Phone Number: 07/10/2021, 1:22 PM  Clinical Narrative:   SNF workup started.  Spoke to [place] with [facility] who stated per notes patient is not at her baseline, needs short term rehab. [place] with [facility] is coming to assess patient at bedside. Harriett Sine stated patient needs to be able to stand, pivot, and transfer with 1 assist to go back to her Memory Care Unit at ALF.   Expected Discharge Plan: Assisted Living Barriers to Discharge: Continued Medical Work up  Expected Discharge Plan and Services Expected Discharge Plan: Assisted Living       Living arrangements for the past 2 months: Assisted Living Facility                                       Social Determinants of Health (SDOH) Interventions    Readmission Risk Interventions No flowsheet data found.

## 2021-07-10 NOTE — TOC Initial Note (Addendum)
Transition of Care [facility]) - Initial/Assessment Note    Patient Details  Name: Mackinley Cassaday MRN: [id number] Date of Birth: Jul 04, 1951  Transition of Care [facility]) CM/SW Contact:    Gildardo Griffes, LCSW Phone Number: 07/10/2021, 10:05 AM  Clinical Narrative:                  CSW notes plan for patient to return to [facility] memory care when medically ready.   Lvm with daughter [name] to discuss dc plan.   Spoke with Elouise Munroe at [facility] at [phone] she requests clinicals be faxed to [phone] or emailed to [email or web address]  CSW did speak with patient's grandson Aurek, he confirms plan is to dc back to memory care and was informed it may be today. He reports [name] is having surgery so she may not be reachable but he will update her as well on dc plan.   PT and OT notes have been faxed to [facility] per their request.   Pending medical readiness to dc.   Expected Discharge Plan: Assisted Living Barriers to Discharge: Continued Medical Work up   Patient Goals and CMS Choice Patient states their goals for this hospitalization and ongoing recovery are:: to go home CMS Medicare.gov Compare Post Acute Care list provided to:: Patient Represenative (must comment) Choice offered to / list presented to : Adult Children  Expected Discharge Plan and Services Expected Discharge Plan: Assisted Living       Living arrangements for the past 2 months: Assisted Living Facility                                      Prior Living Arrangements/Services Living arrangements for the past 2 months: Assisted Living Facility Lives with:: Adult Children                   Activities of Daily Living Home Assistive Devices/Equipment: Environmental consultant (specify type) (Rollator) ADL Screening (condition at time of admission) Patient's cognitive ability adequate to safely complete daily activities?: No Is the patient deaf or have difficulty hearing?:  Yes Does the patient have difficulty seeing, even when wearing glasses/contacts?: No Does the patient have difficulty concentrating, remembering, or making decisions?: Yes Patient able to express need for assistance with ADLs?: No Does the patient have difficulty dressing or bathing?: Yes Independently performs ADLs?: No Communication: Independent Dressing (OT): Dependent Is this a change from baseline?: Pre-admission baseline Grooming: Dependent Is this a change from baseline?: Pre-admission baseline Does the patient have difficulty walking or climbing stairs?: Yes Weakness of Legs: Both Weakness of Arms/Hands: Both  Permission Sought/Granted                  Emotional Assessment         Alcohol / Substance Use: Not Applicable Psych Involvement: No (comment)  Admission diagnosis:  Hypothermia, initial encounter [T68.XXXA] Acute metabolic encephalopathy [G93.41] Patient Active Problem List   Diagnosis Date Noted   Acute metabolic encephalopathy 07/06/2021   Hypothermia 07/06/2021   Type II diabetes mellitus with renal manifestations (HCC) 07/06/2021   Stroke (HCC)    Hypertension    A-fib (HCC)    Chronic diastolic CHF (congestive heart failure) (HCC)    Depression with anxiety    CKD (chronic kidney disease), stage IIIb    Aspiration pneumonia (HCC)    Nausea & vomiting    AKI (acute kidney injury) (HCC)  Hyperglycemia    AMS (altered mental status) 05/05/2021   SIRS (systemic inflammatory response syndrome) (HCC) 01/09/2019   Acute on chronic systolic CHF (congestive heart failure) (HCC) 01/09/2019   Pain in joint involving ankle and foot 01/03/2018   Localized, primary osteoarthritis of shoulder region 01/03/2018   Rupture of tendon of biceps, long head 01/03/2018   Disorder of bursae of shoulder region 01/03/2018   Screening declined by patient 10/25/2017   Full thickness rotator cuff tear 04/21/2016   Chronic bilateral low back pain without sciatica  02/10/2016   H/O rotator cuff tear 02/10/2016   History of vitamin D deficiency 02/10/2016   Type 2 diabetes mellitus without complication, with long-term current use of insulin (HCC) 02/10/2016   Diabetes (HCC) 11/24/2006   HLD (hyperlipidemia) 11/24/2006   OBESITY, NOS 11/24/2006   POST TRAUMATIC STRESS DISORDER 11/24/2006   DEPRESSIVE DISORDER, NOS 11/24/2006   HYPERTENSION, BENIGN SYSTEMIC 11/24/2006   RHINITIS, ALLERGIC 11/24/2006   GERD (gastroesophageal reflux disease) 11/24/2006   OSTEOARTHRITIS, MULTI SITES 11/24/2006   APNEA, SLEEP 11/24/2006   PCP:  Mortimer Fries, PA Pharmacy:   [facility] [phone] Dan Humphreys, [place] - [address] AT [facility] OF [address] [facility] [place] [address] Phone: [phone] Fax: [phone]     Social Determinants of Health (SDOH) Interventions    Readmission Risk Interventions No flowsheet data found.

## 2021-07-10 NOTE — [place] FL2 (Signed)
[place] MEDICAID FL2 LEVEL OF CARE SCREENING TOOL     IDENTIFICATION  Patient Name: Leah Ritter Birthdate: 1950-09-29 Sex: female Admission Date (Current Location): 07/06/2021  [place] and [place] Number:  Chiropodist and Address:  [facility], [address], [place], [place] [address]      Provider Number: [id number]  Attending Physician Name and Address:  Gillis Santa, MD  Relative Name and Phone Number:  Fredric Mare (Daughter)   [phone] [facility])    Current Level of Care: Hospital Recommended Level of Care: Skilled Nursing Facility Prior Approval Number:    Date Approved/Denied:   PASRR Number:    Discharge Plan:      Current Diagnoses: Patient Active Problem List   Diagnosis Date Noted   Acute metabolic encephalopathy 07/06/2021   Hypothermia 07/06/2021   Type II diabetes mellitus with renal manifestations (HCC) 07/06/2021   Stroke (HCC)    Hypertension    A-fib (HCC)    Chronic diastolic CHF (congestive heart failure) (HCC)    Depression with anxiety    CKD (chronic kidney disease), stage IIIb    Aspiration pneumonia (HCC)    Nausea & vomiting    AKI (acute kidney injury) (HCC)    Hyperglycemia    AMS (altered mental status) 05/05/2021   SIRS (systemic inflammatory response syndrome) (HCC) 01/09/2019   Acute on chronic systolic CHF (congestive heart failure) (HCC) 01/09/2019   Pain in joint involving ankle and foot 01/03/2018   Localized, primary osteoarthritis of shoulder region 01/03/2018   Rupture of tendon of biceps, long head 01/03/2018   Disorder of bursae of shoulder region 01/03/2018   Screening declined by patient 10/25/2017   Full thickness rotator cuff tear 04/21/2016   Chronic bilateral low back pain without sciatica 02/10/2016   H/O rotator cuff tear 02/10/2016   History of vitamin D deficiency 02/10/2016   Type 2 diabetes mellitus without complication, with long-term current use of insulin  (HCC) 02/10/2016   Diabetes (HCC) 11/24/2006   HLD (hyperlipidemia) 11/24/2006   OBESITY, NOS 11/24/2006   POST TRAUMATIC STRESS DISORDER 11/24/2006   DEPRESSIVE DISORDER, NOS 11/24/2006   HYPERTENSION, BENIGN SYSTEMIC 11/24/2006   RHINITIS, ALLERGIC 11/24/2006   GERD (gastroesophageal reflux disease) 11/24/2006   OSTEOARTHRITIS, MULTI SITES 11/24/2006   APNEA, SLEEP 11/24/2006    Orientation RESPIRATION BLADDER Height & Weight     Self  Normal Incontinent, External catheter Weight: 217 lb 6 oz (98.6 kg) Height:  5\' 10"  (177.8 cm)  BEHAVIORAL SYMPTOMS/MOOD NEUROLOGICAL BOWEL NUTRITION STATUS      Incontinent Diet (dys 1)  AMBULATORY STATUS COMMUNICATION OF NEEDS Skin   Extensive Assist Verbally Normal                       Personal Care Assistance Level of Assistance  Bathing, Feeding, Dressing Bathing Assistance: Maximum assistance Feeding assistance: Limited assistance Dressing Assistance: Maximum assistance     Functional Limitations Info             SPECIAL CARE FACTORS FREQUENCY  PT (By licensed PT), OT (By licensed OT)     PT Frequency: 5 times per week OT Frequency: 5 times per week            Contractures      Additional Factors Info  Code Status, Allergies Code Status Info: DNR Allergies Info: Oxycodone-acetaminophen, Ezetimibe, Lisinopril, Statins           Current Medications (07/10/2021):  This is the  current hospital active medication list Current Facility-Administered Medications  Medication Dose Route Frequency Provider Last Rate Last Admin   acetaminophen (TYLENOL) suppository 650 mg  650 mg Rectal Q6H PRN Lorretta Harp, MD       albuterol (PROVENTIL) (2.5 MG/3ML) 0.083% nebulizer solution 2.5 mg  2.5 mg Nebulization Q4H PRN Lorretta Harp, MD       amLODipine (NORVASC) tablet 5 mg  5 mg Oral Daily Gillis Santa, MD   5 mg at 07/10/21 1157   Ampicillin-Sulbactam (UNASYN) 3 g in sodium chloride 0.9 % 100 mL IVPB  3 g Intravenous Q6H Ronnald Ramp, RPH 200 mL/hr at 07/10/21 1308 3 g at 07/10/21 1308   aspirin EC tablet 81 mg  81 mg Oral Daily Lorretta Harp, MD   81 mg at 07/10/21 0848   atorvastatin (LIPITOR) tablet 80 mg  80 mg Oral q1800 Lorretta Harp, MD   80 mg at 07/09/21 1648   bisacodyl (DULCOLAX) EC tablet 10 mg  10 mg Oral Daily PRN Gillis Santa, MD   10 mg at 07/09/21 1228   bisacodyl (DULCOLAX) suppository 10 mg  10 mg Rectal Daily PRN Gillis Santa, MD       cholecalciferol (VITAMIN D3) tablet 2,000 Units  2,000 Units Oral Leo Grosser, MD   2,000 Units at 07/10/21 3154   dextromethorphan-guaiFENesin (MUCINEX DM) 30-600 MG per 12 hr tablet 1 tablet  1 tablet Oral BID PRN Lorretta Harp, MD       feeding supplement (ENSURE ENLIVE / ENSURE PLUS) liquid 237 mL  237 mL Oral TID BM Sheikh, Omair Latif, DO   237 mL at 07/10/21 1309   guaiFENesin (ROBITUSSIN) 100 MG/5ML solution 400 mg  20 mL Oral Q4H Sheikh, Omair [place], DO   400 mg at 07/09/21 2328   hydrALAZINE (APRESOLINE) injection 5 mg  5 mg Intravenous Q2H PRN Lorretta Harp, MD       insulin aspart (novoLOG) injection 0-5 Units  0-5 Units Subcutaneous QHS Lorretta Harp, MD       insulin aspart (novoLOG) injection 0-9 Units  0-9 Units Subcutaneous TID WC Lorretta Harp, MD   1 Units at 07/10/21 1309   iohexol (OMNIPAQUE) 9 MG/ML oral solution 500 mL  500 mL Oral Once PRN Lorretta Harp, MD       metoprolol succinate (TOPROL-XL) 24 hr tablet 150 mg  150 mg Oral Daily Lorretta Harp, MD   150 mg at 07/10/21 0849   mirabegron ER (MYRBETRIQ) tablet 25 mg  25 mg Oral Daily Lorretta Harp, MD   25 mg at 07/10/21 1157   multivitamin with minerals tablet 1 tablet  1 tablet Oral Daily Gillis Santa, MD   1 tablet at 07/10/21 0849   ondansetron (ZOFRAN) injection 4 mg  4 mg Intravenous Once Lorretta Harp, MD       ondansetron [facility]) injection 4 mg  4 mg Intravenous Q8H PRN Lorretta Harp, MD       polyethylene glycol (MIRALAX / GLYCOLAX) packet 17 g  17 g Oral Daily Gillis Santa, MD   17 g at 07/10/21 0849    rivaroxaban (XARELTO) tablet 20 mg  20 mg Oral Q supper Lorretta Harp, MD   20 mg at 07/09/21 1648     Discharge Medications: Please see discharge summary for a list of discharge medications.  Relevant Imaging Results:  Relevant Lab Results:   Additional Information SS #: [id number]  Micheal Murad E Therman Hughlett, LCSW

## 2021-07-10 NOTE — Progress Notes (Signed)
   07/10/21 0013  Assess: MEWS Score  Temp 98 F (36.7 C)  BP (!) 215/91  Pulse Rate 81  Resp 18  SpO2 97 %  O2 Device Room Air  Assess: MEWS Score  MEWS Temp 0  MEWS Systolic 2  MEWS Pulse 0  MEWS RR 0  MEWS LOC 0  MEWS Score 2  MEWS Score Color Yellow  Assess: if the MEWS score is Yellow or Red  Were vital signs taken at a resting state? No  Focused Assessment No change from prior assessment  Does the patient meet 2 or more of the SIRS criteria? No  MEWS guidelines implemented *See Row Information* No, vital signs rechecked  Treat  Pain Scale PAINAD  Pain Score 0  Breathing 0  Negative Vocalization 0  Facial Expression 0  Body Language 0  Consolability 0  PAINAD Score 0  Take Vital Signs  Increase Vital Sign Frequency   (vitals retaken and patient is stable)  Escalate  MEWS: Escalate  (patient was agitated when vitals were taken the first time)  Notify: Charge Nurse/RN  Name of Charge Nurse/RN Notified [place], RN  Date Charge Nurse/RN Notified 07/10/21  Time Charge Nurse/RN Notified 0015  Document  Patient Outcome Other (Comment) (Patient calmed down and Vital signs were stable.)  Progress note created (see row info) Yes  Assess: SIRS CRITERIA  SIRS Temperature  0  SIRS Pulse 0  SIRS Respirations  0  SIRS WBC 0  SIRS Score Sum  0

## 2021-07-10 NOTE — Consult Note (Signed)
[facility] Face-to-Face Psychiatry Consult   Reason for Consult: Follow-up consult 70 year old woman with a history of dementia Referring Physician: Lucianne Muss Patient Identification: Leah Ritter MRN:  [id number] Principal Diagnosis: Acute metabolic encephalopathy Diagnosis:  Principal Problem:   Acute metabolic encephalopathy Active Problems:   HLD (hyperlipidemia)   Hypothermia   Type II diabetes mellitus with renal manifestations (HCC)   Stroke (HCC)   Hypertension   A-fib (HCC)   Chronic diastolic CHF (congestive heart failure) (HCC)   Depression with anxiety   CKD (chronic kidney disease), stage IIIb   Aspiration pneumonia (HCC)   Nausea & vomiting   Total Time spent with patient: 30 minutes  Subjective:   Leah Ritter is a 70 y.o. female patient admitted with "I am fine".  HPI: Patient seen for follow-up today.  Patient sitting up in her room without an attendant.  Calm.  No evidence of distress.  Made eye contact and exchanged a few words with me.  Could tell me her name.  Did not know where she was.  Affect blunted.  Not agitated or aggressive did not seem to be any acute risk to herself.  Past Psychiatric History: History of established moderate to severe dementia  Risk to Self:   Risk to Others:   Prior Inpatient Therapy:   Prior Outpatient Therapy:    Past Medical History:  Past Medical History:  Diagnosis Date   A-fib (HCC)    Anxiety    CHF (congestive heart failure) (HCC)    Diabetes (HCC)    GERD (gastroesophageal reflux disease)    Hypertension    Sleep apnea    Stroke [facility])     Past Surgical History:  Procedure Laterality Date   PARTIAL HYSTERECTOMY     tubligation     Family History:  Family History  Problem Relation Age of Onset   Breast cancer Mother    Stroke Father    Hypertension Father    Kidney disease Brother    Cancer Brother    Family Psychiatric  History: See previous Social History:  Social History   Substance and Sexual Activity   Alcohol Use Not Currently     Social History   Substance and Sexual Activity  Drug Use Never    Social History   Socioeconomic History   Marital status: Single    Spouse name: Not on file   Number of children: Not on file   Years of education: Not on file   Highest education level: Not on file  Occupational History   Not on file  Tobacco Use   Smoking status: Never   Smokeless tobacco: Never  Vaping Use   Vaping Use: Never used  Substance and Sexual Activity   Alcohol use: Not Currently   Drug use: Never   Sexual activity: Not Currently  Other Topics Concern   Not on file  Social History Narrative   Not on file   Social Determinants of Health   Financial Resource Strain: Not on file  Food Insecurity: Not on file  Transportation Needs: Not on file  Physical Activity: Not on file  Stress: Not on file  Social Connections: Not on file   Additional Social History:    Allergies:   Allergies  Allergen Reactions   Oxycodone-Acetaminophen Nausea And Vomiting    Other reaction(s): Vomiting   Ezetimibe Other (See Comments)    Reaction unknown   Lisinopril Other (See Comments)    Reaction unknown   Statins Other (See Comments)  Reaction unknown     Labs:  Results for orders placed or performed during the hospital encounter of 07/06/21 (from the past 48 hour(s))  Glucose, capillary     Status: Abnormal   Collection Time: 07/08/21  9:10 PM  Result Value Ref Range   Glucose-Capillary 142 (H) 70 - 99 mg/dL    Comment: Glucose reference range applies only to samples taken after fasting for at least 8 hours.  CBC     Status: Abnormal   Collection Time: 07/09/21  6:44 AM  Result Value Ref Range   WBC 8.7 4.0 - 10.5 K/uL   RBC 3.55 (L) 3.87 - 5.11 MIL/uL   Hemoglobin 10.5 (L) 12.0 - 15.0 g/dL   HCT 25.0 (L) 03.7 - 04.8 %   MCV 89.3 80.0 - 100.0 fL   MCH 29.6 26.0 - 34.0 pg   MCHC 33.1 30.0 - 36.0 g/dL   RDW 88.9 16.9 - 45.0 %   Platelets 233 150 - 400 K/uL    nRBC 0.0 0.0 - 0.2 %    Comment: Performed at [facility], [address], [place], [place] [address]  Basic metabolic panel     Status: Abnormal   Collection Time: 07/09/21  6:44 AM  Result Value Ref Range   Sodium 139 135 - 145 mmol/L   Potassium 4.3 3.5 - 5.1 mmol/L   Chloride 106 98 - 111 mmol/L   CO2 26 22 - 32 mmol/L   Glucose, Bld 139 (H) 70 - 99 mg/dL    Comment: Glucose reference range applies only to samples taken after fasting for at least 8 hours.   BUN 20 8 - 23 mg/dL   Creatinine, Ser 8.00 0.44 - 1.00 mg/dL   Calcium 9.1 8.9 - 34.9 mg/dL   GFR, Estimated >17 >91 mL/min    Comment: (NOTE) Calculated using the CKD-EPI Creatinine Equation (2021)    Anion gap 7 5 - 15    Comment: Performed at [facility], [address], [place], [place] [address]  Magnesium     Status: None   Collection Time: 07/09/21  6:44 AM  Result Value Ref Range   Magnesium 2.0 1.7 - 2.4 mg/dL    Comment: Performed at [facility], [address] [place], [place] [address]  Phosphorus     Status: None   Collection Time: 07/09/21  6:44 AM  Result Value Ref Range   Phosphorus 3.4 2.5 - 4.6 mg/dL    Comment: Performed at [facility], [address] [place], [place] [address]  Glucose, capillary     Status: Abnormal   Collection Time: 07/09/21  7:23 AM  Result Value Ref Range   Glucose-Capillary 127 (H) 70 - 99 mg/dL    Comment: Glucose reference range applies only to samples taken after fasting for at least 8 hours.  Glucose, capillary     Status: Abnormal   Collection Time: 07/09/21 11:26 AM  Result Value Ref Range   Glucose-Capillary 224 (H) 70 - 99 mg/dL    Comment: Glucose reference range applies only to samples taken after fasting for at least 8 hours.  Glucose, capillary     Status: Abnormal   Collection Time: 07/09/21  3:52 PM  Result Value Ref Range   Glucose-Capillary 272 (H) 70 - 99 mg/dL    Comment: Glucose reference range applies only to  samples taken after fasting for at least 8 hours.  Glucose, capillary     Status: Abnormal  Collection Time: 07/09/21  8:24 PM  Result Value Ref Range   Glucose-Capillary 185 (H) 70 - 99 mg/dL    Comment: Glucose reference range applies only to samples taken after fasting for at least 8 hours.  Glucose, capillary     Status: Abnormal   Collection Time: 07/10/21  7:23 AM  Result Value Ref Range   Glucose-Capillary 159 (H) 70 - 99 mg/dL    Comment: Glucose reference range applies only to samples taken after fasting for at least 8 hours.  CBC     Status: Abnormal   Collection Time: 07/10/21  8:28 AM  Result Value Ref Range   WBC 8.1 4.0 - 10.5 K/uL   RBC 3.55 (L) 3.87 - 5.11 MIL/uL   Hemoglobin 10.5 (L) 12.0 - 15.0 g/dL   HCT 66.2 (L) 94.7 - 65.4 %   MCV 89.0 80.0 - 100.0 fL   MCH 29.6 26.0 - 34.0 pg   MCHC 33.2 30.0 - 36.0 g/dL   RDW 65.0 35.4 - 65.6 %   Platelets 238 150 - 400 K/uL   nRBC 0.0 0.0 - 0.2 %    Comment: Performed at [facility], [address] [place], [place] [address]  Basic metabolic panel     Status: Abnormal   Collection Time: 07/10/21  8:28 AM  Result Value Ref Range   Sodium 139 135 - 145 mmol/L   Potassium 4.3 3.5 - 5.1 mmol/L    Comment: HEMOLYSIS AT THIS LEVEL MAY AFFECT RESULT   Chloride 106 98 - 111 mmol/L   CO2 25 22 - 32 mmol/L   Glucose, Bld 153 (H) 70 - 99 mg/dL    Comment: Glucose reference range applies only to samples taken after fasting for at least 8 hours.   BUN 20 8 - 23 mg/dL   Creatinine, Ser 1.70 (H) 0.44 - 1.00 mg/dL   Calcium 9.0 8.9 - 01.7 mg/dL   GFR, Estimated 57 (L) >60 mL/min    Comment: (NOTE) Calculated using the CKD-EPI Creatinine Equation (2021)    Anion gap 8 5 - 15    Comment: Performed at [facility], [address] [place], [place] [address]  Magnesium     Status: None   Collection Time: 07/10/21  8:28 AM  Result Value Ref Range   Magnesium 2.0 1.7 - 2.4 mg/dL    Comment: Performed at  [facility], [address], [place], [place] [address]  Phosphorus     Status: None   Collection Time: 07/10/21  8:28 AM  Result Value Ref Range   Phosphorus 3.6 2.5 - 4.6 mg/dL    Comment: Performed at [facility], [address] [place], [place] [address]  Glucose, capillary     Status: Abnormal   Collection Time: 07/10/21 12:21 PM  Result Value Ref Range   Glucose-Capillary 147 (H) 70 - 99 mg/dL    Comment: Glucose reference range applies only to samples taken after fasting for at least 8 hours.    Current Facility-Administered Medications  Medication Dose Route Frequency Provider Last Rate Last Admin   acetaminophen (TYLENOL) suppository 650 mg  650 mg Rectal Q6H PRN Lorretta Harp, MD       albuterol (PROVENTIL) (2.5 MG/3ML) 0.083% nebulizer solution 2.5 mg  2.5 mg Nebulization Q4H PRN Lorretta Harp, MD       amLODipine (NORVASC) tablet 5 mg  5 mg Oral Daily Gillis Santa, MD   5 mg at 07/10/21 1157   Ampicillin-Sulbactam (  UNASYN) 3 g in sodium chloride 0.9 % 100 mL IVPB  3 g Intravenous Q6H Ronnald Ramp, RPH 200 mL/hr at 07/10/21 1308 3 g at 07/10/21 1308   aspirin EC tablet 81 mg  81 mg Oral Daily Lorretta Harp, MD   81 mg at 07/10/21 0848   atorvastatin (LIPITOR) tablet 80 mg  80 mg Oral q1800 Lorretta Harp, MD   80 mg at 07/09/21 1648   bisacodyl (DULCOLAX) EC tablet 10 mg  10 mg Oral Daily PRN Gillis Santa, MD   10 mg at 07/09/21 1228   bisacodyl (DULCOLAX) suppository 10 mg  10 mg Rectal Daily PRN Gillis Santa, MD       cholecalciferol (VITAMIN D3) tablet 2,000 Units  2,000 Units Oral Leo Grosser, MD   2,000 Units at 07/10/21 4132   dextromethorphan-guaiFENesin (MUCINEX DM) 30-600 MG per 12 hr tablet 1 tablet  1 tablet Oral BID PRN Lorretta Harp, MD       feeding supplement (ENSURE ENLIVE / ENSURE PLUS) liquid 237 mL  237 mL Oral TID BM Sheikh, Omair Latif, DO   237 mL at 07/10/21 1309   guaiFENesin (ROBITUSSIN) 100 MG/5ML solution 400 mg  20 mL Oral Q4H  Sheikh, Kateri Mc [place], DO   400 mg at 07/09/21 2328   hydrALAZINE (APRESOLINE) injection 5 mg  5 mg Intravenous Q2H PRN Lorretta Harp, MD       insulin aspart (novoLOG) injection 0-5 Units  0-5 Units Subcutaneous QHS Lorretta Harp, MD       insulin aspart (novoLOG) injection 0-9 Units  0-9 Units Subcutaneous TID WC Lorretta Harp, MD   1 Units at 07/10/21 1309   iohexol (OMNIPAQUE) 9 MG/ML oral solution 500 mL  500 mL Oral Once PRN Lorretta Harp, MD       metoprolol succinate (TOPROL-XL) 24 hr tablet 150 mg  150 mg Oral Daily Lorretta Harp, MD   150 mg at 07/10/21 0849   mirabegron ER (MYRBETRIQ) tablet 25 mg  25 mg Oral Daily Lorretta Harp, MD   25 mg at 07/10/21 1157   multivitamin with minerals tablet 1 tablet  1 tablet Oral Daily Gillis Santa, MD   1 tablet at 07/10/21 0849   ondansetron (ZOFRAN) injection 4 mg  4 mg Intravenous Once Lorretta Harp, MD       ondansetron [facility]) injection 4 mg  4 mg Intravenous Q8H PRN Lorretta Harp, MD       polyethylene glycol (MIRALAX / GLYCOLAX) packet 17 g  17 g Oral Daily Gillis Santa, MD   17 g at 07/10/21 0849   rivaroxaban (XARELTO) tablet 20 mg  20 mg Oral Q supper Lorretta Harp, MD   20 mg at 07/09/21 1648    Musculoskeletal: Strength & Muscle Tone: decreased Gait & Station: unsteady Patient leans: N/A            Psychiatric Specialty Exam:  Presentation  General Appearance:  No data recorded Eye Contact: No data recorded Speech: No data recorded Speech Volume: No data recorded Handedness: No data recorded  Mood and Affect  Mood: No data recorded Affect: No data recorded  Thought Process  Thought Processes: No data recorded Descriptions of Associations:No data recorded Orientation:No data recorded Thought Content:No data recorded History of Schizophrenia/Schizoaffective disorder:No data recorded Duration of Psychotic Symptoms:No data recorded Hallucinations:No data recorded Ideas of Reference:No data recorded Suicidal Thoughts:No data  recorded Homicidal Thoughts:No data recorded  Sensorium  Memory: No data recorded Judgment: No data recorded Insight: No  data recorded  Executive Functions  Concentration: No data recorded Attention Span: No data recorded Recall: No data recorded Fund of Knowledge: No data recorded Language: No data recorded  Psychomotor Activity  Psychomotor Activity: No data recorded  Assets  Assets: No data recorded  Sleep  Sleep: No data recorded  Physical Exam: Physical Exam Vitals and nursing note reviewed.  Constitutional:      Appearance: Normal appearance.  HENT:     Head: Normocephalic and atraumatic.     Mouth/Throat:     Pharynx: Oropharynx is clear.  Eyes:     Pupils: Pupils are equal, round, and reactive to light.  Cardiovascular:     Rate and Rhythm: Normal rate and regular rhythm.  Pulmonary:     Effort: Pulmonary effort is normal.     Breath sounds: Normal breath sounds.  Abdominal:     General: Abdomen is flat.     Palpations: Abdomen is soft.  Musculoskeletal:        General: Normal range of motion.  Skin:    General: Skin is warm and dry.  Neurological:     General: No focal deficit present.     Mental Status: She is alert. Mental status is at baseline.  Psychiatric:        Attention and Perception: She is inattentive.        Mood and Affect: Mood normal. Affect is blunt.        Speech: She is noncommunicative. Speech is delayed.        Behavior: Behavior is slowed.        Cognition and Memory: Cognition is impaired.   Review of Systems  Constitutional: Negative.   HENT: Negative.    Eyes: Negative.   Respiratory: Negative.    Cardiovascular: Negative.   Gastrointestinal: Negative.   Musculoskeletal: Negative.   Skin: Negative.   Neurological: Negative.   Psychiatric/Behavioral: Negative.    Blood pressure 127/65, pulse 74, temperature 98 F (36.7 C), resp. rate 17, height 5\' 10"  (1.778 m), weight 98.6 kg, SpO2 98 %. Body mass index  is 31.19 kg/m.  Treatment Plan Summary: Plan patient appears to be stabilizing and without knowing her better it appears to me that she is probably back to her baseline mental state.  No further intervention from psychiatric service appears indicated.  We will sign off at this time.  Disposition: No evidence of imminent risk to self or others at present.   Patient does not meet criteria for psychiatric inpatient admission.  , MD 07/10/2021 4:18 PM

## 2021-07-10 NOTE — Progress Notes (Signed)
Can you PROGRESS NOTE    Leah Ritter  GNF:[id number] DOB: [birth year] DOA: 07/06/2021 PCP: Mortimer Fries, PA   Brief Narrative:  The patient is a 70 year old obese African-American female with a past medical history significant for but not limited to hypertension, hyperlipidemia, diabetes mellitus type 2, history of CVA, depression anxiety, history of OSA not on CPAP, history of chronic CHF, atrial fibrillation on anticoagulation with Xarelto, CKD stage IIIb as well as other comorbidities who presented with altered mental status, nausea, vomiting as well as possible aspiration.  Dr. Ezzard Standing spoke with the patient daughter over the phone and baseline she is normally talking does not usually make sense and is difficult to understand.  Patient does recognize her daughter but is not oriented to time and per report patient was found to have worsening mental status and is less responsive in the facility.  She is also had nausea and vomited few times there is concern for aspiration.  In the ED she is barely arousable and not follow commands but she did move all extremities and no facial droop was noted.  She did have a cough no respiratory distress and is unclear if she had any chest pain or abdominal pain if she is not able to provide a subjective history.  Patient was found to be hypothermic with a temperature of 92 and a Bair hugger started in the ED.  Further work-up was done in the ED she had WBC of 11.1 and lactic acid of 1.7.  BNP was 21.4 and blood pressure 126/56.  Chest x-ray was done and showed cardiomegaly and vascular congestion and a CT of the abdomen pelvis was done and were negative for acute issues but did show gallstones without evidence of cholecystitis.  CT of the head was negative for acute cranial abnormalities.  She is admitted for her acute metabolic encephalopathy and likely possible aspiration pneumonia and was initiated on IV Unasyn.   Blood cultures x2 show 1 out of 4 cultures growing  gram-positive rods which is likely contaminant.  We will continue empiric antibiotics and hold her secondary to medications and continue to treat aspiration pneumonia likely in the setting of her nausea vomiting.  We will repeat blood cultures and continue empiric antibiotics  Assessment & Plan:   Principal Problem:   Acute metabolic encephalopathy Active Problems:   HLD (hyperlipidemia)   Hypothermia   Type II diabetes mellitus with renal manifestations (HCC)   Stroke (HCC)   Hypertension   A-fib (HCC)   Chronic diastolic CHF (congestive heart failure) (HCC)   Depression with anxiety   CKD (chronic kidney disease), stage IIIb   Aspiration pneumonia (HCC)   Nausea & vomiting  Acute Metabolic Encephalopathy superimposed on Chronic Dementia -Etiology is not clear.  CT head is negative for acute intracranial abnormalities.   -Polypharmacy is a differential diagnosis. -Hold sedative medications, including Xanax, Remeron, Seroquel -Frequent Neurochecks -She is still extremely altered and does not make sense with her communication but does appear little agitated -Continue delirium precautions -May consider MRI and EEG as well as neuro consultation if not improving however likely is a progression of her dementia and acutely decompensated in the setting of infection -Her blood cultures 1 out of 4 grew out gram-positive rods so we will repeat blood cultures, still final report of culture pending -Continue to Monitor  10/13 psych consulted recommended no intervention and signed off  Sepsis secondary to aspiration PNA -Had a mild Leukocytosis admission which is minimally improved and went from  11.3 and is now 10.8 -Lactic acid level was normal and procalcitonin was less than 0.10x2 -She did meet criteria for sepsis as she was hypothermic, tachypneic, tachycardic, had a leukocytosis and a source of infection -Her O2 saturation was normal -Sepsis criteria is improved and she is afebrile and  her tachycardia and tachypnea is resolved -He was placed on Unasyn but did receive dose of IV Rocephin and azithromycin in the ED -We will continue bronchodilators and sputum culture as well as follow-up on blood cultures -We will add flutter valve, incentive spirometry and guaifenesin -SLP evaluated) put the patient on pured diet with thin liquids and continue monitor for diet advancement -Her mentation is improving and she is a little bit more awake and alert though very confused 10/12 CXR Low lung volumes with mild pulmonary edema    HLD (hyperlipidemia) -Continue with atorvastatin 80 mg p.o. daily   Hypothermia: -Initial body temperature 92, improved to 96 on Bair hugger -On Bair hugger and improved -Temperature is fluctuating, it is now 98   Type II diabetes mellitus with renal manifestations (HCC): -Recent A1c 10.5, poorly controlled.   -Patient is taking Glipizide at the nursing facility -Continue sensitive NovoLog/scale insulin before meals and at bedtime -CBGs ranging from 160-251 -Continue to monitor blood sugars per protocol   History of stroke (HCC) -CT showed chronic CVA  -Continue aspirin 81 mg p.o. daily as well as atorvastatin 80 mg p.o. daily   Hypertension -Blood pressure is improved and last blood pressure reading is now 129/54 -Continue with metoprolol succinate 150 mg p.o. daily as well as IV hydralazine 5 mg every 2 as needed for high blood pressure for systolic blood pressure greater than 65  Hyponatremia -Mild and improved and went from 133 is now 137 -Given the bolus of IV fluid hydration and was placed on maintenance IV fluid hydration which we will continue with normal saline for 75 MLS per hour for another 12 more hours given her history of heart failure -Continue monitor and trend and repeat CMP in a.m.  A-fib (HCC) -Continue with metoprolol succinate 150 mg p.o. daily as well as anticoagulation with rivaroxaban 20 mg daily with supper -Continue  with telemetry monitoring   Chronic Diastolic CHF (congestive heart failure) (HCC): -2D echo on 05/07/2021 showed EF > 75% with grade 1 diastolic dysfunction.   -Patient does not have leg edema, no DVT.  Normal BNP 21.4.  -Does not seem to have CHF exacerbation but will need to continue to monitor given that she is getting IV fluid hydration -Chest x-ray showed "Cardiomegaly with unchanged to slightly increased pulmonary vascular congestion/mild edema." -Hold Lasix, spironolactone since the patient was septic on admission but may resume soon -Monitor for signs and symptoms of volume overload and will obtain strict I's and O's and daily weights   Depression with anxiety -Holding Xanax, Remeron, Seroquel due to concern for polypharmacy   CKD (chronic kidney disease), stage IIIb -Stable -Patient's BUNs/creatinine went from 30/1.17 -> 29/1.12 -> 24/1.13 -Avoid further nephrotoxic medications, contrast dyes, hypotension renally adjust medications -Continue monitor and trend renal function carefully and repeat CMP in a.m.   Nausea & Vomiting -CT of abdomen/pelvis is negative for acute intra-abdominal issues.  Lipase normal 42. -C/w Supportive care and gentle Hydration -C/w antiemetics with IV ondansetron 4 mg every 8 as needed for nausea vomiting  Obesity -Complicates overall prognosis and care -Estimated body mass index is 31.19 kg/m as calculated from the following:   Height as of this encounter:  5\' 10"  (1.778 m).   Weight as of this encounter: 98.6 kg. -Weight Loss and Dietary Counseling given    DVT prophylaxis: Anticoagulated with rivaroxaban Code Status: DO NOT RESUSCITATE  Family Communication: Discussed with the daughter at bedside Disposition Plan: Pending further clinical improvement in her mentation and tolerance of diet without nausea or vomiting; will repeat chest x-ray in a.m.  Status is: Inpatient  Remains inpatient appropriate because:Unsafe d/c plan, IV treatments  appropriate due to intensity of illness or inability to take PO, and Inpatient level of care appropriate due to severity of illness  Dispo: The patient is from: ALF memory care unit              Anticipated d/c is to: SNF as per PT and OT eval.  Still there is no significant but patient has significant dementia, otherwise medically stable, can be discharged to SNF when bed is available.               Patient currently is medically stable to d/c.   Difficult to place patient No  Consultants:  Psychiatry  Procedures: None  Antimicrobials:  Anti-infectives (From admission, onward)    Start     Dose/Rate Route Frequency Ordered Stop   07/06/21 1200  Ampicillin-Sulbactam (UNASYN) 3 g in sodium chloride 0.9 % 100 mL IVPB        3 g 200 mL/hr over 30 Minutes Intravenous Every 6 hours 07/06/21 1116 07/11/21 1159   07/06/21 0900  cefTRIAXone (ROCEPHIN) 1 g in sodium chloride 0.9 % 100 mL IVPB  Status:  Discontinued        1 g 200 mL/hr over 30 Minutes Intravenous  Once 07/06/21 0849 07/06/21 0850   07/06/21 0900  cefTRIAXone (ROCEPHIN) 2 g in sodium chloride 0.9 % 100 mL IVPB  Status:  Discontinued        2 g 200 mL/hr over 30 Minutes Intravenous Every 24 hours 07/06/21 0851 07/06/21 1115   07/06/21 0900  azithromycin (ZITHROMAX) 500 mg in sodium chloride 0.9 % 250 mL IVPB  Status:  Discontinued        500 mg 250 mL/hr over 60 Minutes Intravenous Every 24 hours 07/06/21 0851 07/06/21 1115        Subjective: No significant events overnight, the patient was very sleepy, did not woke up and then physical therapy was trying to wake her up and then she woke up finally and was able to sit at the edge of the bed with max assistance and she refused to ambulate.  Patient seems to be having some behavioral issues so discussed with patient's daughter at bedside that we will consult psych for further evaluation and then decide for disposition tomorrow a.m.    Objective: Vitals:   07/10/21 0039  07/10/21 0525 07/10/21 0719 07/10/21 1219  BP: (!) 194/90 (!) 156/57 135/66 127/65  Pulse: 76 66 63 74  Resp: 18 18 17 17   Temp:  97.6 F (36.4 C) (!) 97.5 F (36.4 C) 98 F (36.7 C)  TempSrc:   Oral   SpO2: 97% 100% 98% 98%  Weight:  98.6 kg    Height:        Intake/Output Summary (Last 24 hours) at 07/10/2021 1636 Last data filed at 07/10/2021 1340 Gross per 24 hour  Intake 120 ml  Output 200 ml  Net -80 ml   Filed Weights   07/08/21 0421 07/09/21 0500 07/10/21 0525  Weight: 99.1 kg 98.4 kg 98.6 kg   Examination: Physical  Exam:  Constitutional: NAD, sleepy, AAO x1 at baseline Eyes: Lids and conjunctivae normal, sclerae anicteric  ENMT: External Ears, Nose appear normal. Grossly normal hearing.  Neck: Appears normal, supple, no cervical masses, normal ROM, no appreciable thyromegaly; no JVD Respiratory: Diminished to auscultation bilaterally, no wheezing, rales, rhonchi or crackles. Normal respiratory effort and patient is not tachypenic. No accessory muscle use.  Cardiovascular: RRR, no murmurs / rubs / gallops. Mild LE Edema Abdomen: Soft, non-tender, Distended 2/2 body habitus. Bowel sounds positive x4.  GU: Deferred. Musculoskeletal: No clubbing / cyanosis of digits/nails. No joint deformity upper and lower extremities. Skin: No rashes, lesions, ulcers on a limited skin evaluation. No induration; Warm and dry.  Neurologic: Does not follow commands but does move her extremities independently and is having a conversation and mumbling Psychiatric: Impaired judgment and insight.  She is awake, AAOx 1   Data Reviewed: I have personally reviewed following labs and imaging studies  CBC: Recent Labs  Lab 07/06/21 0609 07/07/21 0533 07/08/21 0525 07/09/21 0644 07/10/21 0828  WBC 11.1* 10.8* 9.0 8.7 8.1  NEUTROABS 9.9*  --  6.2  --   --   HGB 11.5* 10.5* 10.0* 10.5* 10.5*  HCT 33.9* 31.7* 30.0* 31.7* 31.6*  MCV 87.8 89.8 88.8 89.3 89.0  PLT 256 232 238 233 238    Basic Metabolic Panel: Recent Labs  Lab 07/06/21 0746 07/06/21 1223 07/07/21 0533 07/08/21 0525 07/09/21 0644 07/10/21 0828  NA 133*  --  137 138 139 139  K 5.6* 5.1 4.4 4.1 4.3 4.3  CL 101  --  106 105 106 106  CO2 24  --  25 25 26 25   GLUCOSE 273*  --  181* 136* 139* 153*  BUN 29*  --  24* 20 20 20   CREATININE 1.12*  --  1.13* 1.07* 0.88 1.05*  CALCIUM 8.7*  --  8.9 8.5* 9.1 9.0  MG  --   --   --  1.7 2.0 2.0  PHOS  --   --   --  2.7 3.4 3.6   GFR: Estimated Creatinine Clearance: 62.5 mL/min (A) (by C-G formula based on SCr of 1.05 mg/dL (H)). Liver Function Tests: Recent Labs  Lab 07/06/21 0609 07/08/21 0525  AST 28 28  ALT 32 35  ALKPHOS 112 93  BILITOT 1.0 0.7  PROT 8.2* 6.6  ALBUMIN 3.3* 2.5*   Recent Labs  Lab 07/06/21 0746  LIPASE 42   No results for input(s): AMMONIA in the last 168 hours. Coagulation Profile: No results for input(s): INR, PROTIME in the last 168 hours. Cardiac Enzymes: No results for input(s): CKTOTAL, CKMB, CKMBINDEX, TROPONINI in the last 168 hours. BNP (last 3 results) No results for input(s): PROBNP in the last 8760 hours. HbA1C: No results for input(s): HGBA1C in the last 72 hours. CBG: Recent Labs  Lab 07/09/21 1126 07/09/21 1552 07/09/21 2024 07/10/21 0723 07/10/21 1221  GLUCAP 224* 272* 185* 159* 147*   Lipid Profile: No results for input(s): CHOL, HDL, LDLCALC, TRIG, CHOLHDL, LDLDIRECT in the last 72 hours. Thyroid Function Tests: No results for input(s): TSH, T4TOTAL, FREET4, T3FREE, THYROIDAB in the last 72 hours.  Anemia Panel: Recent Labs    07/08/21 1614  VITAMINB12 872  FOLATE 12.3  TIBC 270  IRON 35   Sepsis Labs: Recent Labs  Lab 07/06/21 0609 07/07/21 0533 07/08/21 0525  PROCALCITON <0.10 <0.10 <0.10  LATICACIDVEN 1.7  --   --     Recent Results (from the past  240 hour(s))  Resp Panel by RT-PCR (Flu A&B, Covid) Nasopharyngeal Swab     Status: None   Collection Time: 07/06/21  6:09 AM    Specimen: Nasopharyngeal Swab; Nasopharyngeal(NP) swabs in vial transport medium  Result Value Ref Range Status   SARS Coronavirus 2 by RT PCR NEGATIVE NEGATIVE Final    Comment: (NOTE) SARS-CoV-2 target nucleic acids are NOT DETECTED.  The SARS-CoV-2 RNA is generally detectable in upper respiratory specimens during the acute phase of infection. The lowest concentration of SARS-CoV-2 viral copies this assay can detect is 138 copies/mL. A negative result does not preclude SARS-Cov-2 infection and should not be used as the sole basis for treatment or other patient management decisions. A negative result may occur with  improper specimen collection/handling, submission of specimen other than nasopharyngeal swab, presence of viral mutation(s) within the areas targeted by this assay, and inadequate number of viral copies(<138 copies/mL). A negative result must be combined with clinical observations, patient history, and epidemiological information. The expected result is Negative.  Fact Sheet for Patients:  BloggerCourse.com  Fact Sheet for Healthcare Providers:  SeriousBroker.it  This test is no t yet approved or cleared by the [place] FDA and  has been authorized for detection and/or diagnosis of SARS-CoV-2 by FDA under an Emergency Use Authorization (EUA). This EUA will remain  in effect (meaning this test can be used) for the duration of the COVID-19 declaration under Section 564(b)(1) of the Act, 21 U.S.C.section 360bbb-3(b)(1), unless the authorization is terminated  or revoked sooner.       Influenza A by PCR NEGATIVE NEGATIVE Final   Influenza B by PCR NEGATIVE NEGATIVE Final    Comment: (NOTE) The Xpert Xpress SARS-CoV-2/FLU/RSV plus assay is intended as an aid in the diagnosis of influenza from Nasopharyngeal swab specimens and should not be used as a sole basis for treatment. Nasal washings and aspirates are  unacceptable for Xpert Xpress SARS-CoV-2/FLU/RSV testing.  Fact Sheet for Patients: BloggerCourse.com  Fact Sheet for Healthcare Providers: SeriousBroker.it  This test is not yet approved or cleared by the [place] FDA and has been authorized for detection and/or diagnosis of SARS-CoV-2 by FDA under an Emergency Use Authorization (EUA). This EUA will remain in effect (meaning this test can be used) for the duration of the COVID-19 declaration under Section 564(b)(1) of the Act, 21 U.S.C. section 360bbb-3(b)(1), unless the authorization is terminated or revoked.  Performed at [facility], [address] [place], [place] [address]   Blood culture (routine x 2)     Status: None (Preliminary result)   Collection Time: 07/06/21  6:10 AM   Specimen: BLOOD  Result Value Ref Range Status   Specimen Description   Final    BLOOD LEFT AC Performed at [facility], [address], [place], [place] [address]    Special Requests   Final    BOTTLES DRAWN AEROBIC AND ANAEROBIC Blood Culture results may not be optimal due to an excessive volume of blood received in culture bottles Performed at [facility], [address] [place], [place] [address]    Culture  Setup Time   Final    GRAM POSITIVE RODS AEROBIC BOTTLE ONLY CRITICAL RESULT CALLED TO, READ BACK BY AND VERIFIED WITH: [email or web address]  07/07/21 RH Performed at [facility], [address], [place], [place] [address]    Culture   Final    GRAM POSITIVE RODS CULTURE REINCUBATED FOR BETTER GROWTH Performed at [facility],  [address] [place], [place] [address]    Report Status PENDING  Incomplete  Blood culture (routine x 2)     Status: None (Preliminary result)   Collection Time: 07/06/21  6:10 AM   Specimen: BLOOD  Result Value Ref Range Status   Specimen Description BLOOD LEFT HAND  Final   Special Requests   Final     BOTTLES DRAWN AEROBIC AND ANAEROBIC Blood Culture adequate volume   Culture   Final    NO GROWTH 4 DAYS Performed at [facility], [address] [place], [place] [address]    Report Status PENDING  Incomplete  CULTURE, BLOOD (ROUTINE X 2) w Reflex to ID Panel     Status: None (Preliminary result)   Collection Time: 07/07/21 11:51 AM   Specimen: BLOOD  Result Value Ref Range Status   Specimen Description BLOOD BLOOD RIGHT FOREARM  Final   Special Requests   Final    BOTTLES DRAWN AEROBIC ONLY Blood Culture results may not be optimal due to an inadequate volume of blood received in culture bottles   Culture   Final    NO GROWTH 3 DAYS Performed at [facility], [address] [place], [place] [address]    Report Status PENDING  Incomplete  CULTURE, BLOOD (ROUTINE X 2) w Reflex to ID Panel     Status: None (Preliminary result)   Collection Time: 07/07/21 12:06 PM   Specimen: BLOOD  Result Value Ref Range Status   Specimen Description BLOOD BLOOD RIGHT HAND  Final   Special Requests   Final    BOTTLES DRAWN AEROBIC AND ANAEROBIC Blood Culture results may not be optimal due to an inadequate volume of blood received in culture bottles   Culture   Final    NO GROWTH 3 DAYS Performed at [facility], [address] [place], [place] [address]    Report Status PENDING  Incomplete    RN Pressure Injury Documentation:     Estimated body mass index is 31.19 kg/m as calculated from the following:   Height as of this encounter: 5\' 10"  (1.778 m).   Weight as of this encounter: 98.6 kg.  Malnutrition Type:  Malnutrition Characteristics: Signs/Symptoms: per patient/family report Nutrition Interventions: Interventions: Ensure Enlive (each supplement provides 350kcal and 20 grams of protein), Magic cup, MVI Radiology Studies: No results found.  Scheduled Meds:  amLODipine  5 mg Oral Daily   aspirin EC  81 mg Oral Daily   atorvastatin  80 mg Oral q1800    cholecalciferol  2,000 Units Oral BH-q7a   feeding supplement  237 mL Oral TID BM   guaiFENesin  20 mL Oral Q4H   insulin aspart  0-5 Units Subcutaneous QHS   insulin aspart  0-9 Units Subcutaneous TID WC   metoprolol succinate  150 mg Oral Daily   mirabegron ER  25 mg Oral Daily   multivitamin with minerals  1 tablet Oral Daily   ondansetron (ZOFRAN) IV  4 mg Intravenous Once   polyethylene glycol  17 g Oral Daily   rivaroxaban  20 mg Oral Q supper   Continuous Infusions:  ampicillin-sulbactam (UNASYN) IV 3 g (07/10/21 1308)    LOS: 4 days   Gillis Santa, DO [facility] PAGER is on AMION  If 7PM-7AM, please contact night-coverage [email or web address]

## 2021-07-10 NOTE — Plan of Care (Signed)

## 2021-07-10 NOTE — Progress Notes (Signed)
Physical Therapy Treatment Patient Details Name: Leah Ritter MRN: [id number] DOB: [birth year] Today's Date: 07/10/2021   History of Present Illness 70 y.o. female with medical history significant for Dementia per chart notes needing assistance w/ ADLs at NH, hypertension, hyperlipidemia, diabetes mellitus, stroke, depression, anxiety, OSA not on CPAP, CHF, atrial fibrillation on Xarelto, CKD-3B, who presents with altered mental status, nausea, vomiting, possible aspiration of vomit.  Pt's previous admit in 04/2021 for AKI.  CXR at admit: Cardiomegaly with unchanged to slightly increased pulmonary vascular  congestion/mild edema.    PT Comments    Patient more awake/alert upon arrival to session this date.  Continues with mumbled, tangential speech; limited ability to maintain conversation or actively follow commands from therapist. Intermittently resistive to hand-over-hand facilitation, generally restless and grabbing/manipulating covers throughout session.  Was able to initiate gait training with 4WRW (to simulate normal routine), min/mod assist +1 from therapist.  Veers R and demonstrates R lateral lean at times; limited awareness with minimal attempts at self correction.  Continue to recommend +1 assist and 4WRW at all times.  Representative from [facility] arrived end of session; requested formal observation of mobility (versus verbal report of session just completed).  Re-assisted patient to edge of bed-supine/sit, mod assist; sitting balance, close sup; sit/stand (patient initiated spontaneously) without assist device, cga/min assist.  As anticipated, patient maintains capacity for movement and performs optimally when initiated spontaneously (and in normal, familiar routine/environment).  Continue to recommend return to memory care unit with necessary level of assist from staff at discharge.     Recommendations for follow up therapy are one component of a multi-disciplinary discharge  planning process, led by the attending physician.  Recommendations may be updated based on patient status, additional functional criteria and insurance authorization.  Follow Up Recommendations   (return to memory care with care/support from staff as needeed)     Equipment Recommendations       Recommendations for Other Services       Precautions / Restrictions Precautions Precautions: Fall Restrictions Weight Bearing Restrictions: No     Mobility  Bed Mobility Overal bed mobility: Needs Assistance Bed Mobility: Supine to Sit;Sit to Supine           General bed mobility comments: level of assist fluctuates from mod to dep level of physical assist, dependant on patient's willingness to engage with task    Transfers Overall transfer level: Needs assistance Equipment used: 4-wheeled walker Transfers: Sit to/from Stand Sit to Stand: Mod assist;+2 physical assistance;Min assist         General transfer comment: fluctuates from min to mod assist +2 based on willingness to participate with task  Ambulation/Gait Ambulation/Gait assistance: Min assist Gait Distance (Feet): 150 Feet Assistive device: 4-wheeled walker       General Gait Details: reciprocal stepping with inconsistent step height/length; R lateral lean with minimal awareness/attempts at self-correction.  Mod assist for RW management (to prevent veering R).  Patient tends to follow guidance of RW (limited ability to follow verbal commands)   Stairs             Wheelchair Mobility    Modified Rankin (Stroke Patients Only)       Balance Overall balance assessment: Needs assistance Sitting-balance support: No upper extremity supported;Feet supported Sitting balance-Leahy Scale: Fair     Standing balance support: Bilateral upper extremity supported Standing balance-Leahy Scale: Poor  Cognition Arousal/Alertness: Awake/alert Behavior During Therapy:  Flat affect Overall Cognitive Status: History of cognitive impairments - at baseline                                 General Comments: Pt with very tangential speech and follows commands 25% of the time with mod - max multimodal cuing. Max cuing for alertness during session; did improve once upright at edge of bed      Exercises Other Exercises Other Exercises: Patient extremely soiled upon arrival to room; dep assist +2-3 for hygiene. Patient generally restless, constantly grasping at covers/peri-area and somewhat resistive to care (unable to reorient).  Dep assist for bed mobility, peri-care and undergarment/linen change. Other Exercises: Representative from [facility] arrived end of session; requested formal observation of mobility (versus verbal report of session just completed).  Re-assisted patient to edge of bed-supine/sit, mod assist; sitting balance, close sup; sit/stand (patient initiated spontaneously) without assist device, cga/min assist.  As anticipated, patient maintains capacity for movement and performs optimally when initiated spontaneously (and in normal, familiar routine/environment).    General Comments        Pertinent Vitals/Pain Pain Assessment: Faces Faces Pain Scale: No hurt    Home Living                      Prior Function            PT Goals (current goals can now be found in the care plan section) Acute Rehab PT Goals Patient Stated Goal: per daughter, to walk PT Goal Formulation: With patient/family Time For Goal Achievement: 07/23/21 Potential to Achieve Goals: Fair Progress towards PT goals: Progressing toward goals    Frequency    Min 2X/week      PT Plan Current plan remains appropriate    Co-evaluation              AM-PAC PT "6 Clicks" Mobility   Outcome Measure  Help needed turning from your back to your side while in a flat bed without using bedrails?: A Lot Help needed moving from lying on your  back to sitting on the side of a flat bed without using bedrails?: A Lot Help needed moving to and from a bed to a chair (including a wheelchair)?: A Little Help needed standing up from a chair using your arms (e.g., wheelchair or bedside chair)?: A Little Help needed to walk in hospital room?: A Little Help needed climbing 3-5 steps with a railing? : A Lot 6 Click Score: 15    End of Session Equipment Utilized During Treatment: Gait belt Activity Tolerance: Patient tolerated treatment well Patient left: in bed;with call bell/phone within reach;with bed alarm set Nurse Communication: Mobility status PT Visit Diagnosis: Muscle weakness (generalized) (M62.81);Difficulty in walking, not elsewhere classified (R26.2)     Time: 6283-1517 PT Time Calculation (min) (ACUTE ONLY): 39 min  Charges:  $Gait Training: 8-22 mins $Therapeutic Activity: 23-37 mins                     Shakala Marlatt H. Manson Passey, PT, DPT, NCS 07/10/21, 5:09 PM [phone]

## 2021-07-11 DIAGNOSIS — I5032 Chronic diastolic (congestive) heart failure: Secondary | ICD-10-CM | POA: Diagnosis not present

## 2021-07-11 DIAGNOSIS — A419 Sepsis, unspecified organism: Secondary | ICD-10-CM | POA: Diagnosis not present

## 2021-07-11 DIAGNOSIS — J690 Pneumonitis due to inhalation of food and vomit: Secondary | ICD-10-CM | POA: Diagnosis not present

## 2021-07-11 DIAGNOSIS — G9341 Metabolic encephalopathy: Secondary | ICD-10-CM | POA: Diagnosis not present

## 2021-07-11 LAB — BASIC METABOLIC PANEL
Anion gap: 8 (ref 5–15)
BUN: 19 mg/dL (ref 8–23)
CO2: 27 mmol/L (ref 22–32)
Calcium: 9.1 mg/dL (ref 8.9–10.3)
Chloride: 106 mmol/L (ref 98–111)
Creatinine, Ser: 0.98 mg/dL (ref 0.44–1.00)
GFR, Estimated: >60 mL/min (ref >60)
Glucose, Bld: 158 mg/dL — ABNORMAL HIGH (ref 70–99)
Potassium: 3.8 mmol/L (ref 3.5–5.1)
Sodium: 141 mmol/L (ref 135–145)

## 2021-07-11 LAB — CBC
HCT: 33 % — ABNORMAL LOW (ref 36.0–46.0)
Hemoglobin: 10.5 g/dL — ABNORMAL LOW (ref 12.0–15.0)
MCH: 28.1 pg (ref 26.0–34.0)
MCHC: 31.8 g/dL (ref 30.0–36.0)
MCV: 88.2 fL (ref 80.0–100.0)
Platelets: 255 10*3/uL (ref 150–400)
RBC: 3.74 MIL/uL — ABNORMAL LOW (ref 3.87–5.11)
RDW: 13.8 % (ref 11.5–15.5)
WBC: 6.3 10*3/uL (ref 4.0–10.5)
nRBC: 0 % (ref 0.0–0.2)

## 2021-07-11 LAB — GLUCOSE, CAPILLARY
Glucose-Capillary: 165 mg/dL — ABNORMAL HIGH (ref 70–99)
Glucose-Capillary: 350 mg/dL — ABNORMAL HIGH (ref 70–99)
Glucose-Capillary: 413 mg/dL — ABNORMAL HIGH (ref 70–99)
Glucose-Capillary: 262 mg/dL — ABNORMAL HIGH (ref 70–99)

## 2021-07-11 LAB — CULTURE, BLOOD (ROUTINE X 2)
Culture: NO GROWTH
Special Requests: ADEQUATE

## 2021-07-11 LAB — PHOSPHORUS: Phosphorus: 2.9 mg/dL (ref 2.5–4.6)

## 2021-07-11 LAB — GLUCOSE, RANDOM: Glucose, Bld: 397 mg/dL — ABNORMAL HIGH (ref 70–99)

## 2021-07-11 LAB — MAGNESIUM: Magnesium: 2 mg/dL (ref 1.7–2.4)

## 2021-07-11 MED ORDER — HALOPERIDOL LACTATE 5 MG/ML IJ SOLN
0.5000 mg | Freq: Four times a day (QID) | INTRAMUSCULAR | Status: DC | PRN
  Administered 2021-07-11: 0.5 mg via INTRAVENOUS
  Filled 2021-07-11 (×2): qty 1

## 2021-07-11 NOTE — Progress Notes (Signed)
Patient was experiencing an increased level of anxiety as she was noted to be swinging at staff and refusing care. MD is notified of the occurrence and new orders are received. PRN medication is offered and administered. She is currently in bed with her eyes closed. Equal rise and fall of the chest is noted. No acute distress is present.

## 2021-07-11 NOTE — Progress Notes (Signed)
PROGRESS NOTE    Leah Ritter  ZOX:[id number] DOB: [birth year] DOA: 07/06/2021 PCP: Mortimer Fries, PA   Brief Narrative:  The patient is a 70 year old obese African-American female with a past medical history significant for but not limited to hypertension, hyperlipidemia, diabetes mellitus type 2, history of CVA, depression anxiety, history of OSA not on CPAP, history of chronic CHF, atrial fibrillation on anticoagulation with Xarelto, CKD stage IIIb as well as other comorbidities who presented with altered mental status, nausea, vomiting as well as possible aspiration.  Dr. Ezzard Standing spoke with the patient daughter over the phone and baseline she is normally talking does not usually make sense and is difficult to understand.  Patient does recognize her daughter but is not oriented to time and per report patient was found to have worsening mental status and is less responsive in the facility.  She is also had nausea and vomited few times there is concern for aspiration.  In the ED she is barely arousable and not follow commands but she did move all extremities and no facial droop was noted.  She did have a cough no respiratory distress and is unclear if she had any chest pain or abdominal pain if she is not able to provide a subjective history.  Patient was found to be hypothermic with a temperature of 92 and a Bair hugger started in the ED.   Further work-up was done in the ED she had WBC of 11.1 and lactic acid of 1.7.  BNP was 21.4 and blood pressure 126/56.  Chest x-ray was done and showed cardiomegaly and vascular congestion and a CT of the abdomen pelvis was done and were negative for acute issues but did show gallstones without evidence of cholecystitis.  CT of the head was negative for acute cranial abnormalities.  She is admitted for her acute metabolic encephalopathy and likely possible aspiration pneumonia and was initiated on IV Unasyn.   Blood cultures x2 show 1 out of 4 cultures growing  gram-positive rods which is likely contaminant.  We will continue empiric antibiotics and hold her secondary to medications and continue to treat aspiration pneumonia likely in the setting of her nausea vomiting.  We will repeat blood cultures and continue empiric antibiotics     Assessment & Plan:   Principal Problem:   Acute metabolic encephalopathy Active Problems:   HLD (hyperlipidemia)   Hypothermia   Type II diabetes mellitus with renal manifestations (HCC)   Stroke (HCC)   Hypertension   A-fib (HCC)   Chronic diastolic CHF (congestive heart failure) (HCC)   Depression with anxiety   CKD (chronic kidney disease), stage IIIb   Aspiration pneumonia (HCC)   Nausea & vomiting   Acute Metabolic Encephalopathy superimposed on Chronic Dementia -Etiology is not clear.  CT head is negative for acute intracranial abnormalities.   -Polypharmacy is a differential diagnosis. -Hold sedative medications, including Xanax, Remeron, Seroquel -Frequent Neurochecks -She is still extremely altered and does not make sense with her communication but does appear little agitated -Continue delirium precautions -May consider MRI and EEG as well as neuro consultation if not improving however likely is a progression of her dementia and acutely decompensated in the setting of infection -Her blood cultures 1 out of 4 grew out gram-positive rods so we will repeat blood cultures, still final report of culture pending -Continue to Monitor  10/13 psych consulted recommended no intervention and signed off   Sepsis secondary to aspiration PNA -Had a mild Leukocytosis admission which is minimally  improved and went from 11.3 and is now NL, NO FEVERS -Lactic acid level was normal and procalcitonin was less than 0.10x2 -She did meet criteria for sepsis as she was hypothermic, tachypneic, tachycardic, had a leukocytosis and a source of infection -Her O2 saturation was normal -Sepsis criteria is improved and she  is afebrile and her tachycardia and tachypnea is resolved -He was placed on Unasyn but did receive dose of IV Rocephin and azithromycin in the ED -We will continue bronchodilators and sputum culture as well as follow-up on blood cultures -We will continue flutter valve, incentive spirometry and guaifenesin -SLP evaluated) put the patient on pured diet with thin liquids and continue monitor for diet advancement -Her mentation is improving and she is a little bit more awake and alert though very confused 10/12 CXR Low lung volumes with mild pulmonary edema      HLD (hyperlipidemia) -Continue with atorvastatin 80 mg p.o. daily   Hypothermia: -Initial body temperature 92, improved to 96 on Bair hugger -On Bair hugger and improved -Temperature is fluctuating, it is now 98   Type II diabetes mellitus with renal manifestations (HCC): -Recent A1c 10.5, poorly controlled.   -Patient is taking Glipizide at the nursing facility -Continue sensitive NovoLog/scale insulin before meals and at bedtime -CBGs ranging from 160-251 -Continue to monitor blood sugars per protocol   History of stroke (HCC) -CT showed chronic CVA  -Continue aspirin 81 mg p.o. daily as well as atorvastatin 80 mg p.o. daily   Hypertension -Blood pressure is improved and last blood pressure reading is now 129/54 -Continue with metoprolol succinate 150 mg p.o. daily as well as IV hydralazine 5 mg every 2 as needed for high blood pressure for systolic blood pressure greater than 65   Hyponatremia -Mild and improved and went from 133 is now 137 -Given the bolus of IV fluid hydration and was placed on maintenance IV fluid hydration which we will continue with normal saline for 75 MLS per hour for another 12 more hours given her history of heart failure -Continue monitor and trend and repeat CMP in a.m.   A-fib (HCC) -Continue with metoprolol succinate 150 mg p.o. daily as well as anticoagulation with rivaroxaban 20 mg daily  with supper -Continue with telemetry monitoring   Chronic Diastolic CHF (congestive heart failure) (HCC): -2D echo on 05/07/2021 showed EF > 75% with grade 1 diastolic dysfunction.   -Patient does not have leg edema, no DVT.  Normal BNP 21.4.  -Does not seem to have CHF exacerbation but will need to continue to monitor given that she is getting IV fluid hydration -Chest x-ray showed "Cardiomegaly with unchanged to slightly increased pulmonary vascular congestion/mild edema." -Hold Lasix, spironolactone since the patient was septic on admission but may resume soon -Monitor for signs and symptoms of volume overload and will obtain strict I's and O's and daily weights   Depression with anxiety -Holding Xanax, Remeron, Seroquel due to concern for polypharmacy   CKD (chronic kidney disease), stage IIIb -Stable -Patient's BUNs/creatinine went from 30/1.17 -> 29/1.12 -> 24/1.13 -Avoid further nephrotoxic medications, contrast dyes, hypotension renally adjust medications -Continue monitor and trend renal function carefully and repeat CMP in a.m.   Nausea & Vomiting -CT of abdomen/pelvis is negative for acute intra-abdominal issues.  Lipase normal 42. -C/w Supportive care and gentle Hydration -C/w antiemetics with IV ondansetron 4 mg every 8 as needed for nausea vomiting   Obesity -Complicates overall prognosis and care -Estimated body mass index is 31.19 kg/m as  calculated from the following:   Height as of this encounter: 5\' 10"  (1.778 m).   Weight as of this encounter: 98.6 kg. -Weight Loss and Dietary Counseling given   DVT prophylaxis:   Code Status: DNR    Code Status Orders  (From admission, onward)           Start     Ordered   07/06/21 1102  Do not attempt resuscitation (DNR)  Continuous        07/06/21 1101           Code Status History     Date Active Date Inactive Code Status Order ID Comments User Context   05/05/2021 2134 05/08/2021 1854 Full Code  07/08/2021  [id number], DO ED   01/10/2019 0014 01/10/2019 1912 Full Code 01/12/2019  [id number], MD Inpatient      Family Communication: Discussed with daughter by phone, she had not yet spoken to patient today Disposition Plan:    Will need further improvement in patient's mentation as well as ability to tolerate diet without nausea and vomiting. Consults called: None Admission status: Inpatient   Consultants:  PT/OT, PSYCHIATRY  Procedures:  CT ABDOMEN PELVIS WO CONTRAST  Result Date: 07/06/2021 CLINICAL DATA:  Nausea, vomiting EXAM: CT ABDOMEN AND PELVIS WITHOUT CONTRAST TECHNIQUE: Multidetector CT imaging of the abdomen and pelvis was performed following the standard protocol without IV contrast. COMPARISON:  None. FINDINGS: Lower chest: Coronary artery and aortic calcifications. Bibasilar atelectasis. Hepatobiliary: Layering gallstones within the gallbladder. No biliary ductal dilatation or visible ductal stones. No focal hepatic abnormality. Pancreas: No focal abnormality or ductal dilatation. Spleen: No focal abnormality.  Normal size. Adrenals/Urinary Tract: No adrenal abnormality. No focal renal abnormality. No stones or hydronephrosis. Urinary bladder is unremarkable. Stomach/Bowel: Normal appendix. Scattered colonic diverticulosis. No active diverticulitis. Stomach and small bowel decompressed. Vascular/Lymphatic: Scattered aortic atherosclerosis. No evidence of aneurysm or adenopathy. Reproductive: Prior hysterectomy.  No adnexal masses. Other: No free fluid or free air. Musculoskeletal: No acute bony abnormality. IMPRESSION: Cholelithiasis.  No CT evidence of acute cholecystitis. Bibasilar atelectasis. Coronary artery disease, aortic atherosclerosis. Scattered colonic diverticulosis. No acute findings in the abdomen or pelvis. Electronically Signed   By: 09/05/2021 M.D.   On: 07/06/2021 12:51   CT HEAD WO CONTRAST (09/05/2021)  Result Date: 07/06/2021 CLINICAL DATA:  Altered mental  status. EXAM: CT HEAD WITHOUT CONTRAST TECHNIQUE: Contiguous axial images were obtained from the base of the skull through the vertex without intravenous contrast. COMPARISON:  05/05/2021 FINDINGS: Brain: There is no evidence of an acute infarct, intracranial hemorrhage, mass, midline shift, or extra-axial fluid collection. A small to moderate-sized chronic infarct is again noted in the posterior right frontal lobe. Hypodensities elsewhere in the cerebral white matter bilaterally are unchanged and nonspecific but compatible with mild chronic small vessel ischemic disease. Mild cerebral atrophy is unchanged. Vascular: Calcified atherosclerosis at the skull base. No hyperdense vessel. Skull: No acute fracture or suspicious osseous lesion. Sinuses/Orbits: Mucosal thickening and large volume fluid in the left maxillary sinus. Mild mucosal thickening in the other left-sided paranasal sinuses. Clear mastoid air cells. Unremarkable orbits. Other: None. IMPRESSION: 1. No evidence of acute intracranial abnormality. 2. Chronic ischemia including a chronic right frontal infarct. 3. Left maxillary sinusitis. Electronically Signed   By: 07/05/2021 M.D.   On: 07/06/2021 06:45   DG Chest Port 1 View  Result Date: 07/08/2021 CLINICAL DATA:  Shortness of breath. EXAM: PORTABLE CHEST 1 VIEW COMPARISON:  07/06/2021 and  CT chest 01/13/2018. FINDINGS: Trachea is midline. Heart is enlarged, as before. Lungs are low in volume with mild interstitial prominence and indistinctness. No airspace consolidation or pleural fluid. IMPRESSION: Low lung volumes with mild pulmonary edema. Electronically Signed   By: Leanna Battles M.D.   On: 07/08/2021 08:11   DG Chest Portable 1 View  Result Date: 07/06/2021 CLINICAL DATA:  Altered mental status.  Vomiting. EXAM: PORTABLE CHEST 1 VIEW COMPARISON:  05/05/2021 FINDINGS: The cardiac silhouette remains enlarged. Lung volumes remain low with similar appearance of pulmonary vascular  congestion and stable to slightly increased interstitial prominence. There may be trace pleural effusions. No pneumothorax is identified. No acute osseous abnormality is seen. IMPRESSION: Cardiomegaly with unchanged to slightly increased pulmonary vascular congestion/mild edema. Electronically Signed   By: Sebastian Ache M.D.   On: 07/06/2021 06:29       Subjective: Patient is still confused but improving Suspect baseline will be difficult to determine given patient's advancing dementia  Objective: Vitals:   07/10/21 2140 07/11/21 0506 07/11/21 0512 07/11/21 0756  BP: (!) 176/73 (!) 177/68  132/71  Pulse: 75 77  98  Resp: Temp: 98.1 F (36.7 C) 97.6 F (36.4 C)  (!) 97.5 F (36.4 C)  TempSrc: Oral Oral    SpO2:    100%  Weight:   96.2 kg   Height:        Intake/Output Summary (Last 24 hours) at 07/11/2021 1332 Last data filed at 07/11/2021 0503 Gross per 24 hour  Intake 120 ml  Output 280 ml  Net -160 ml   Filed Weights   07/09/21 0500 07/10/21 0525 07/11/21 0512  Weight: 98.4 kg 98.6 kg 96.2 kg    Examination:  General exam: Appears calm and comfortable confused at baseline Respiratory system: Clear to auscultation. Respiratory effort normal. Cardiovascular system: S1 & S2 heard, RRR. No JVD, murmurs, rubs, gallops or clicks. No pedal edema. Gastrointestinal system: Abdomen is nondistended, soft and nontender. No organomegaly or masses felt. Normal bowel sounds heard. Central nervous system: Alert and oriented. No focal neurological deficits. Extremities: Warm well perfused, no edema Skin: No rashes, lesions or ulcers Psychiatry: Impaired judgment and insight secondary to advancing dementia with cognitive deficits    Data Reviewed: I have personally reviewed following labs and imaging studies  CBC: Recent Labs  Lab 07/06/21 0609 07/07/21 0533 07/08/21 0525 07/09/21 0644 07/10/21 0828 07/11/21 0411  WBC 11.1* 10.8* 9.0 8.7 8.1 6.3  NEUTROABS 9.9*   --  6.2  --   --   --   HGB 11.5* 10.5* 10.0* 10.5* 10.5* 10.5*  HCT 33.9* 31.7* 30.0* 31.7* 31.6* 33.0*  MCV 87.8 89.8 88.8 89.3 89.0 88.2  PLT 256 232 238 233 238 255   Basic Metabolic Panel: Recent Labs  Lab 07/07/21 0533 07/08/21 0525 07/09/21 0644 07/10/21 0828 07/11/21 0411  NA 137 138 139 139 141  K 4.4 4.1 4.3 4.3 3.8  CL 106 105 106 106 106  CO2 GLUCOSE 181* 136* 139* 153* 158*  BUN 24* CREATININE 1.13* 1.07* 0.88 1.05* 0.98  CALCIUM 8.9 8.5* 9.1 9.0 9.1  MG  --  1.7 2.0 2.0 2.0  PHOS  --  2.7 3.4 3.6 2.9   GFR: Estimated Creatinine Clearance: 66.2 mL/min (by C-G formula based on SCr of 0.98 mg/dL). Liver Function Tests: Recent Labs  Lab 07/06/21 0609 07/08/21 0525  AST 28 28  ALT 32 35  ALKPHOS 112 93  BILITOT 1.0 0.7  PROT 8.2* 6.6  ALBUMIN 3.3* 2.5*   Recent Labs  Lab 07/06/21 0746  LIPASE 42   No results for input(s): AMMONIA in the last 168 hours. Coagulation Profile: No results for input(s): INR, PROTIME in the last 168 hours. Cardiac Enzymes: No results for input(s): CKTOTAL, CKMB, CKMBINDEX, TROPONINI in the last 168 hours. BNP (last 3 results) No results for input(s): PROBNP in the last 8760 hours. HbA1C: No results for input(s): HGBA1C in the last 72 hours. CBG: Recent Labs  Lab 07/10/21 1221 07/10/21 2114 07/10/21 2206 07/11/21 0755 07/11/21 1154  GLUCAP 147* 256* 220* 165* 350*   Lipid Profile: No results for input(s): CHOL, HDL, LDLCALC, TRIG, CHOLHDL, LDLDIRECT in the last 72 hours. Thyroid Function Tests: No results for input(s): TSH, T4TOTAL, FREET4, T3FREE, THYROIDAB in the last 72 hours. Anemia Panel: Recent Labs    07/08/21 1614  VITAMINB12 872  FOLATE 12.3  TIBC 270  IRON 35   Sepsis Labs: Recent Labs  Lab 07/06/21 0609 07/07/21 0533 07/08/21 0525  PROCALCITON <0.10 <0.10 <0.10  LATICACIDVEN 1.7  --   --     Recent Results (from the past 240 hour(s))  Resp Panel by RT-PCR  (Flu A&B, Covid) Nasopharyngeal Swab     Status: None   Collection Time: 07/06/21  6:09 AM   Specimen: Nasopharyngeal Swab; Nasopharyngeal(NP) swabs in vial transport medium  Result Value Ref Range Status   SARS Coronavirus 2 by RT PCR NEGATIVE NEGATIVE Final    Comment: (NOTE) SARS-CoV-2 target nucleic acids are NOT DETECTED.  The SARS-CoV-2 RNA is generally detectable in upper respiratory specimens during the acute phase of infection. The lowest concentration of SARS-CoV-2 viral copies this assay can detect is 138 copies/mL. A negative result does not preclude SARS-Cov-2 infection and should not be used as the sole basis for treatment or other patient management decisions. A negative result may occur with  improper specimen collection/handling, submission of specimen other than nasopharyngeal swab, presence of viral mutation(s) within the areas targeted by this assay, and inadequate number of viral copies(<138 copies/mL). A negative result must be combined with clinical observations, patient history, and epidemiological information. The expected result is Negative.  Fact Sheet for Patients:  BloggerCourse.com  Fact Sheet for Healthcare Providers:  SeriousBroker.it  This test is no t yet approved or cleared by the [place] FDA and  has been authorized for detection and/or diagnosis of SARS-CoV-2 by FDA under an Emergency Use Authorization (EUA). This EUA will remain  in effect (meaning this test can be used) for the duration of the COVID-19 declaration under Section 564(b)(1) of the Act, 21 U.S.C.section 360bbb-3(b)(1), unless the authorization is terminated  or revoked sooner.       Influenza A by PCR NEGATIVE NEGATIVE Final   Influenza B by PCR NEGATIVE NEGATIVE Final    Comment: (NOTE) The Xpert Xpress SARS-CoV-2/FLU/RSV plus assay is intended as an aid in the diagnosis of influenza from Nasopharyngeal swab specimens  and should not be used as a sole basis for treatment. Nasal washings and aspirates are unacceptable for Xpert Xpress SARS-CoV-2/FLU/RSV testing.  Fact Sheet for Patients: BloggerCourse.com  Fact Sheet for Healthcare Providers: SeriousBroker.it  This test is not yet approved or cleared by the [place] FDA and has been authorized for detection and/or diagnosis of SARS-CoV-2 by FDA under an Emergency Use Authorization (EUA). This EUA will remain in effect (meaning this test can be used)  for the duration of the COVID-19 declaration under Section 564(b)(1) of the Act, 21 U.S.C. section 360bbb-3(b)(1), unless the authorization is terminated or revoked.  Performed at [facility], [address] [place], [place] [address]   Blood culture (routine x 2)     Status: Abnormal   Collection Time: 07/06/21  6:10 AM   Specimen: BLOOD  Result Value Ref Range Status   Specimen Description   Final    BLOOD LEFT [facility] Performed at [facility], [address], [place], [place] [address]    Special Requests   Final    BOTTLES DRAWN AEROBIC AND ANAEROBIC Blood Culture results may not be optimal due to an excessive volume of blood received in culture bottles Performed at [facility], [address] [place], [place] [address]    Culture  Setup Time   Final    GRAM POSITIVE RODS AEROBIC BOTTLE ONLY CRITICAL RESULT CALLED TO, READ BACK BY AND VERIFIED WITH: JASON ROBBINS@[id number]  07/07/21 RH GRAM POSITIVE COCCI AEROBIC BOTTLE ONLY CRITICAL RESULT CALLED TO, READ BACK BY AND VERIFIED WITH: PHARM D A.CHAPPELL ON [id number] AT 0951 BY E.PARRISH    Culture (A)  Final    DIPHTHEROIDS(CORYNEBACTERIUM SPECIES) AEROCOCCUS URINAE Standardized susceptibility testing for this organism is not available. Performed at [facility], [address] [place], [place] [address]    Report Status 07/11/2021 FINAL  Final  Blood  culture (routine x 2)     Status: None   Collection Time: 07/06/21  6:10 AM   Specimen: BLOOD  Result Value Ref Range Status   Specimen Description BLOOD LEFT HAND  Final   Special Requests   Final    BOTTLES DRAWN AEROBIC AND ANAEROBIC Blood Culture adequate volume   Culture   Final    NO GROWTH 5 DAYS Performed at [facility], [address] [place], [place] [address]    Report Status 07/11/2021 FINAL  Final  CULTURE, BLOOD (ROUTINE X 2) w Reflex to ID Panel     Status: None (Preliminary result)   Collection Time: 07/07/21 11:51 AM   Specimen: BLOOD  Result Value Ref Range Status   Specimen Description BLOOD BLOOD RIGHT FOREARM  Final   Special Requests   Final    BOTTLES DRAWN AEROBIC ONLY Blood Culture results may not be optimal due to an inadequate volume of blood received in culture bottles   Culture   Final    NO GROWTH 4 DAYS Performed at [facility], [address], [place], [place] [address]    Report Status PENDING  Incomplete  CULTURE, BLOOD (ROUTINE X 2) w Reflex to ID Panel     Status: None (Preliminary result)   Collection Time: 07/07/21 12:06 PM   Specimen: BLOOD  Result Value Ref Range Status   Specimen Description BLOOD BLOOD RIGHT HAND  Final   Special Requests   Final    BOTTLES DRAWN AEROBIC AND ANAEROBIC Blood Culture results may not be optimal due to an inadequate volume of blood received in culture bottles   Culture   Final    NO GROWTH 4 DAYS Performed at [facility], [address] [place], [place] [address]    Report Status PENDING  Incomplete         Radiology Studies: No results found.      Scheduled Meds:  amLODipine  5 mg Oral Daily   aspirin EC  81 mg Oral Daily   atorvastatin  80 mg  Oral q1800   cholecalciferol  2,000 Units Oral BH-q7a   feeding supplement  237 mL Oral TID BM   guaiFENesin  20 mL Oral Q4H   insulin aspart  0-5 Units Subcutaneous QHS   insulin aspart  0-9 Units Subcutaneous TID  WC   metoprolol succinate  150 mg Oral Daily   mirabegron ER  25 mg Oral Daily   multivitamin with minerals  1 tablet Oral Daily   ondansetron (ZOFRAN) IV  4 mg Intravenous Once   polyethylene glycol  17 g Oral Daily   rivaroxaban  20 mg Oral Q supper   Continuous Infusions:   LOS: 5 days    Time spent: 35 min    Burke Keels, MD [facility]  If 7PM-7AM, please contact night-coverage  07/11/2021, 1:32 PM

## 2021-07-12 DIAGNOSIS — A419 Sepsis, unspecified organism: Secondary | ICD-10-CM | POA: Diagnosis not present

## 2021-07-12 DIAGNOSIS — J690 Pneumonitis due to inhalation of food and vomit: Secondary | ICD-10-CM | POA: Diagnosis not present

## 2021-07-12 DIAGNOSIS — G9341 Metabolic encephalopathy: Secondary | ICD-10-CM | POA: Diagnosis not present

## 2021-07-12 DIAGNOSIS — I5032 Chronic diastolic (congestive) heart failure: Secondary | ICD-10-CM | POA: Diagnosis not present

## 2021-07-12 LAB — CBC
HCT: 32.4 % — ABNORMAL LOW (ref 36.0–46.0)
Hemoglobin: 10.6 g/dL — ABNORMAL LOW (ref 12.0–15.0)
MCH: 29 pg (ref 26.0–34.0)
MCHC: 32.7 g/dL (ref 30.0–36.0)
MCV: 88.8 fL (ref 80.0–100.0)
Platelets: 297 10*3/uL (ref 150–400)
RBC: 3.65 MIL/uL — ABNORMAL LOW (ref 3.87–5.11)
RDW: 14.2 % (ref 11.5–15.5)
WBC: 6.6 10*3/uL (ref 4.0–10.5)
nRBC: 0 % (ref 0.0–0.2)

## 2021-07-12 LAB — GLUCOSE, CAPILLARY
Glucose-Capillary: 170 mg/dL — ABNORMAL HIGH (ref 70–99)
Glucose-Capillary: 362 mg/dL — ABNORMAL HIGH (ref 70–99)
Glucose-Capillary: 330 mg/dL — ABNORMAL HIGH (ref 70–99)
Glucose-Capillary: 297 mg/dL — ABNORMAL HIGH (ref 70–99)

## 2021-07-12 LAB — CULTURE, BLOOD (ROUTINE X 2)
Culture: NO GROWTH
Culture: NO GROWTH

## 2021-07-12 LAB — BASIC METABOLIC PANEL
Anion gap: 12 (ref 5–15)
BUN: 26 mg/dL — ABNORMAL HIGH (ref 8–23)
CO2: 20 mmol/L — ABNORMAL LOW (ref 22–32)
Calcium: 8.9 mg/dL (ref 8.9–10.3)
Chloride: 107 mmol/L (ref 98–111)
Creatinine, Ser: 0.99 mg/dL (ref 0.44–1.00)
GFR, Estimated: >60 mL/min (ref >60)
Glucose, Bld: 139 mg/dL — ABNORMAL HIGH (ref 70–99)
Potassium: 5.1 mmol/L (ref 3.5–5.1)
Sodium: 139 mmol/L (ref 135–145)

## 2021-07-12 MED ORDER — METOPROLOL TARTRATE 50 MG PO TABS
75.0000 mg | ORAL_TABLET | Freq: Two times a day (BID) | ORAL | Status: DC
  Administered 2021-07-12 – 2021-07-14 (×5): 75 mg via ORAL
  Filled 2021-07-12 (×5): qty 1

## 2021-07-12 NOTE — Progress Notes (Signed)
PROGRESS NOTE    Leah Ritter  JIZ:[id number] DOB: 04-01-1951 DOA: 07/06/2021 PCP: Mortimer Fries, PA   Brief Narrative:  The patient is a 70 year old obese African-American female with a past medical history significant for but not limited to hypertension, hyperlipidemia, diabetes mellitus type 2, history of CVA, depression anxiety, history of OSA not on CPAP, history of chronic CHF, atrial fibrillation on anticoagulation with Xarelto, CKD stage IIIb as well as other comorbidities who presented with altered mental status, nausea, vomiting as well as possible aspiration.  Dr. Ezzard Standing spoke with the patient daughter over the phone and baseline she is normally talking does not usually make sense and is difficult to understand.  Patient does recognize her daughter but is not oriented to time and per report patient was found to have worsening mental status and is less responsive in the facility.  She is also had nausea and vomited few times there is concern for aspiration.  In the ED she is barely arousable and not follow commands but she did move all extremities and no facial droop was noted.  She did have a cough no respiratory distress and is unclear if she had any chest pain or abdominal pain if she is not able to provide a subjective history.  Patient was found to be hypothermic with a temperature of 92 and a Bair hugger started in the ED.   Further work-up was done in the ED she had WBC of 11.1 and lactic acid of 1.7.  BNP was 21.4 and blood pressure 126/56.  Chest x-ray was done and showed cardiomegaly and vascular congestion and a CT of the abdomen pelvis was done and were negative for acute issues but did show gallstones without evidence of cholecystitis.  CT of the head was negative for acute cranial abnormalities.  She is admitted for her acute metabolic encephalopathy and likely possible aspiration pneumonia and was initiated on IV Unasyn.   Blood cultures x2 show 1 out of 4 cultures growing  gram-positive rods which is likely contaminant.  We will continue empiric antibiotics and hold her secondary to medications and continue to treat aspiration pneumonia likely in the setting of her nausea vomiting.  We will repeat blood cultures and continue empiric antibiotics     Assessment & Plan:   Principal Problem:   Acute metabolic encephalopathy Active Problems:   HLD (hyperlipidemia)   Hypothermia   Type II diabetes mellitus with renal manifestations (HCC)   Stroke (HCC)   Hypertension   A-fib (HCC)   Chronic diastolic CHF (congestive heart failure) (HCC)   Depression with anxiety   CKD (chronic kidney disease), stage IIIb   Aspiration pneumonia (HCC)   Nausea & vomiting   Acute Metabolic Encephalopathy superimposed on Chronic Dementia -slowly improving, consistent with delirium superimposed on dementia -Etiology is not clear.  CT head is negative for acute intracranial abnormalities.   -Polypharmacy is a differential diagnosis. -Holding sedative medications, including Xanax, Remeron, Seroquel -Frequent Neurochecks -Continue delirium precautions -Her blood cultures 1 out of 4 grew out gram-positive rods, repeat blood cultures were negative -Continue to Monitor  10/13 psych consulted recommended no intervention and signed off   Sepsis secondary to aspiration PNA -Had a mild Leukocytosis admission which is minimally improved and went from 11.3 and is now NL, NO FEVERS -Lactic acid level was normal and procalcitonin was less than 0.10x2 -She did meet criteria for sepsis as she was hypothermic, tachypneic, tachycardic, had a leukocytosis and a source of infection -Her O2 saturation was  normal -Sepsis criteria is improved and she is afebrile and her tachycardia and tachypnea is resolved -He was placed on Unasyn but did receive dose of IV Rocephin and azithromycin in the ED -We will continue flutter valve, incentive spirometry and guaifenesin -SLP evaluated) put the patient  on pured diet with thin liquids and continue monitor for diet advancement -Her mentation is improving and she is a little bit more awake and alert though  confused 10/12 CXR Low lung volumes with mild pulmonary edema, repeat in AM     HLD (hyperlipidemia) -Continue with atorvastatin 80 mg p.o. daily   Hypothermia: -Initial body temperature 92, improved     Type II diabetes mellitus with renal manifestations (HCC): -Recent A1c 10.5, poorly controlled.   -Patient is taking Glipizide at the nursing facility -Continue sensitive NovoLog/scale insulin before meals and at bedtime -CBGs ranging from 160-251 -Continue to monitor blood sugars per protocol   History of stroke (HCC) -CT showed chronic CVA  -Continue aspirin 81 mg p.o. daily as well as atorvastatin 80 mg p.o. daily   Hypertension -Blood pressure is improved and last blood pressure reading is now 129/54 -Continue with metoprolol succinate 150 mg p.o. daily as well as IV hydralazine 5 mg every 2 as needed for high blood pressure for systolic blood pressure greater than 65   Hyponatremia -resolved -Continue monitor    A-fib (HCC) -Continue with metoprolol succinate 150 mg p.o. daily as well as anticoagulation with rivaroxaban 20 mg daily with supper -Continue with telemetry monitoring   Chronic Diastolic CHF (congestive heart failure) (HCC): -2D echo on 05/07/2021 showed EF > 75% with grade 1 diastolic dysfunction.   -Patient does not have leg edema, no DVT.  Normal BNP 21.4.  -Does not seem to have CHF exacerbation but will need to continue to monitor given that she is getting IV fluid hydration -Chest x-ray showed "Cardiomegaly with unchanged to slightly increased pulmonary vascular congestion/mild edema." -Hold Lasix, spironolactone since the patient was septic on admission but may resume soon -Monitor for signs and symptoms of volume overload and will obtain strict I's and O's and daily weights   Depression with  anxiety -Holding Xanax, Remeron, Seroquel due to concern for polypharmacy   CKD (chronic kidney disease), stage IIIb -Stable -Avoid further nephrotoxic medications, contrast dyes, hypotension renally adjust medications -Continue monitor and trend renal function carefully and repeat CMP in a.m.   Nausea & Vomiting -CT of abdomen/pelvis is negative for acute intra-abdominal issues.   -Lipase normal 42. -C/w Supportive care and gentle Hydration -C/w antiemetics with IV ondansetron 4 mg every 8 as needed for nausea vomiting   Obesity -Complicates overall prognosis and care -Estimated body mass index is 31.19 kg/m as calculated from the following:   Height as of this encounter:  (1.778 m).   Weight as of this encounter: 98.6 kg. -Weight Loss and Dietary Counseling given   DVT prophylaxis: MV:HQIONGE   Code Status: DNR    Code Status Orders  (From admission, onward)           Start     Ordered   07/06/21 1102  Do not attempt resuscitation (DNR)  Continuous        07/06/21 1101           Code Status History     Date Active Date Inactive Code Status Order ID Comments User Context   05/05/2021 2134 05/08/2021 1854 Full Code [id number]  Darlin Drop, DO ED   01/10/2019 [phone]  01/10/2019 1912 Full Code [id number]  Oralia Manis, MD Inpatient      Family Communication: Spoke with patient's daughter Saturday.  Please follow-up Monday Disposition Plan: Patient's mentation slowly improving delirium slowly clearing not yet medically stable for discharge  Consults called: None Admission status: Inpatient   Consultants:  PT, OT, psychiatry  Procedures:  CT ABDOMEN PELVIS WO CONTRAST  Result Date: 07/06/2021 CLINICAL DATA:  Nausea, vomiting EXAM: CT ABDOMEN AND PELVIS WITHOUT CONTRAST TECHNIQUE: Multidetector CT imaging of the abdomen and pelvis was performed following the standard protocol without IV contrast. COMPARISON:  None. FINDINGS: Lower chest: Coronary artery and  aortic calcifications. Bibasilar atelectasis. Hepatobiliary: Layering gallstones within the gallbladder. No biliary ductal dilatation or visible ductal stones. No focal hepatic abnormality. Pancreas: No focal abnormality or ductal dilatation. Spleen: No focal abnormality.  Normal size. Adrenals/Urinary Tract: No adrenal abnormality. No focal renal abnormality. No stones or hydronephrosis. Urinary bladder is unremarkable. Stomach/Bowel: Normal appendix. Scattered colonic diverticulosis. No active diverticulitis. Stomach and small bowel decompressed. Vascular/Lymphatic: Scattered aortic atherosclerosis. No evidence of aneurysm or adenopathy. Reproductive: Prior hysterectomy.  No adnexal masses. Other: No free fluid or free air. Musculoskeletal: No acute bony abnormality. IMPRESSION: Cholelithiasis.  No CT evidence of acute cholecystitis. Bibasilar atelectasis. Coronary artery disease, aortic atherosclerosis. Scattered colonic diverticulosis. No acute findings in the abdomen or pelvis. Electronically Signed   By: Charlett Nose M.D.   On: 07/06/2021 12:51   CT HEAD WO CONTRAST ( )  Result Date: 07/06/2021 CLINICAL DATA:  Altered mental status. EXAM: CT HEAD WITHOUT CONTRAST TECHNIQUE: Contiguous axial images were obtained from the base of the skull through the vertex without intravenous contrast. COMPARISON:  05/05/2021 FINDINGS: Brain: There is no evidence of an acute infarct, intracranial hemorrhage, mass, midline shift, or extra-axial fluid collection. A small to moderate-sized chronic infarct is again noted in the posterior right frontal lobe. Hypodensities elsewhere in the cerebral white matter bilaterally are unchanged and nonspecific but compatible with mild chronic small vessel ischemic disease. Mild cerebral atrophy is unchanged. Vascular: Calcified atherosclerosis at the skull base. No hyperdense vessel. Skull: No acute fracture or suspicious osseous lesion. Sinuses/Orbits: Mucosal thickening and large  volume fluid in the left maxillary sinus. Mild mucosal thickening in the other left-sided paranasal sinuses. Clear mastoid air cells. Unremarkable orbits. Other: None. IMPRESSION: 1. No evidence of acute intracranial abnormality. 2. Chronic ischemia including a chronic right frontal infarct. 3. Left maxillary sinusitis. Electronically Signed   By: Sebastian Ache M.D.   On: 07/06/2021 06:45   DG Chest Port 1 View  Result Date: 07/08/2021 CLINICAL DATA:  Shortness of breath. EXAM: PORTABLE CHEST 1 VIEW COMPARISON:  07/06/2021 and CT chest 01/13/2018. FINDINGS: Trachea is midline. Heart is enlarged, as before. Lungs are low in volume with mild interstitial prominence and indistinctness. No airspace consolidation or pleural fluid. IMPRESSION: Low lung volumes with mild pulmonary edema. Electronically Signed   By: Leanna Battles M.D.   On: 07/08/2021 08:11   DG Chest Portable 1 View  Result Date: 07/06/2021 CLINICAL DATA:  Altered mental status.  Vomiting. EXAM: PORTABLE CHEST 1 VIEW COMPARISON:  05/05/2021 FINDINGS: The cardiac silhouette remains enlarged. Lung volumes remain low with similar appearance of pulmonary vascular congestion and stable to slightly increased interstitial prominence. There may be trace pleural effusions. No pneumothorax is identified. No acute osseous abnormality is seen. IMPRESSION: Cardiomegaly with unchanged to slightly increased pulmonary vascular congestion/mild edema. Electronically Signed   By: Sebastian Ache M.D.   On: 07/06/2021 06:29  Subjective: Patient still confused but improving Noted to be combative with staff last night  Objective: Vitals:   07/12/21 0558 07/12/21 0747 07/12/21 1200 07/12/21 1537  BP: 134/66 (!) 122/50 (!) 121/51 135/64  Pulse: 64 64 60 62  Resp:  18  18  Temp: 97.7 F (36.5 C) (!) 97.4 F (36.3 C)  97.7 F (36.5 C)  TempSrc: Oral Oral  Oral  SpO2: 100% 100%  100%  Weight:      Height:        Intake/Output Summary (Last 24  hours) at 07/12/2021 1547 Last data filed at 07/12/2021 1044 Gross per 24 hour  Intake 240 ml  Output 100 ml  Net 140 ml   Filed Weights   07/10/21 0525 07/11/21 0512 07/12/21 0549  Weight: 98.6 kg 96.2 kg 96.8 kg    Examination:  General exam: Appears calm and comfortable my evaluation this morning no evidence of aggression Respiratory system: Clear to auscultation. Respiratory effort normal. Cardiovascular system: S1 & S2 heard, RRR. No JVD, murmurs, rubs, gallops or clicks. No pedal edema. Gastrointestinal system: Abdomen is nondistended, soft and nontender. No organomegaly or masses felt. Normal bowel sounds heard. Central nervous system: Alert and oriented. No focal neurological deficits. Extremities: Symmetric 5 x 5 power. Skin: No rashes, lesions or ulcers Psychiatry: J judgment and insight are impaired secondary to cognitive deficits with known dementia    Data Reviewed: I have personally reviewed following labs and imaging studies  CBC: Recent Labs  Lab 07/06/21 0609 07/07/21 0533 07/08/21 0525 07/09/21 0644 07/10/21 0828 07/11/21 0411 07/12/21 0659  WBC 11.1*   < > 9.0 8.7 8.1 6.3 6.6  NEUTROABS 9.9*  --  6.2  --   --   --   --   HGB 11.5*   < > 10.0* 10.5* 10.5* 10.5* 10.6*  HCT 33.9*   < > 30.0* 31.7* 31.6* 33.0* 32.4*  MCV 87.8   < > 88.8 89.3 89.0 88.2 88.8  PLT 256   < > 238 233 238 255 297   < > = values in this interval not displayed.   Basic Metabolic Panel: Recent Labs  Lab 07/08/21 0525 07/09/21 0644 07/10/21 0828 07/11/21 0411 07/11/21 1748 07/12/21 0516  NA 138 139 139 141  --  139  K 4.1 4.3 4.3 3.8  --  5.1  CL 105 106 106 106  --  107  CO2 25 26 25 27   --  20*  GLUCOSE 136* 139* 153* 158* 397* 139*  BUN 20 20 20 19   --  26*  CREATININE 1.07* 0.88 1.05* 0.98  --  0.99  CALCIUM 8.5* 9.1 9.0 9.1  --  8.9  MG 1.7 2.0 2.0 2.0  --   --   PHOS 2.7 3.4 3.6 2.9  --   --    GFR: Estimated Creatinine Clearance: 65.7 mL/min (by C-G  formula based on SCr of 0.99 mg/dL). Liver Function Tests: Recent Labs  Lab 07/06/21 0609 07/08/21 0525  AST 28 28  ALT 32 35  ALKPHOS 112 93  BILITOT 1.0 0.7  PROT 8.2* 6.6  ALBUMIN 3.3* 2.5*   Recent Labs  Lab 07/06/21 0746  LIPASE 42   No results for input(s): AMMONIA in the last 168 hours. Coagulation Profile: No results for input(s): INR, PROTIME in the last 168 hours. Cardiac Enzymes: No results for input(s): CKTOTAL, CKMB, CKMBINDEX, TROPONINI in the last 168 hours. BNP (last 3 results) No results for input(s): PROBNP in the  last 8760 hours. HbA1C: No results for input(s): HGBA1C in the last 72 hours. CBG: Recent Labs  Lab 07/11/21 1154 07/11/21 1644 07/11/21 2051 07/12/21 0747 07/12/21 1151  GLUCAP 350* 413* 262* 170* 362*   Lipid Profile: No results for input(s): CHOL, HDL, LDLCALC, TRIG, CHOLHDL, LDLDIRECT in the last 72 hours. Thyroid Function Tests: No results for input(s): TSH, T4TOTAL, FREET4, T3FREE, THYROIDAB in the last 72 hours. Anemia Panel: No results for input(s): VITAMINB12, FOLATE, FERRITIN, TIBC, IRON, RETICCTPCT in the last 72 hours. Sepsis Labs: Recent Labs  Lab 07/06/21 0609 07/07/21 0533 07/08/21 0525  PROCALCITON <0.10 <0.10 <0.10  LATICACIDVEN 1.7  --   --     Recent Results (from the past 240 hour(s))  Resp Panel by RT-PCR (Flu A&B, Covid) Nasopharyngeal Swab     Status: None   Collection Time: 07/06/21  6:09 AM   Specimen: Nasopharyngeal Swab; Nasopharyngeal(NP) swabs in vial transport medium  Result Value Ref Range Status   SARS Coronavirus 2 by RT PCR NEGATIVE NEGATIVE Final    Comment: (NOTE) SARS-CoV-2 target nucleic acids are NOT DETECTED.  The SARS-CoV-2 RNA is generally detectable in upper respiratory specimens during the acute phase of infection. The lowest concentration of SARS-CoV-2 viral copies this assay can detect is 138 copies/mL. A negative result does not preclude SARS-Cov-2 infection and should not be  used as the sole basis for treatment or other patient management decisions. A negative result may occur with  improper specimen collection/handling, submission of specimen other than nasopharyngeal swab, presence of viral mutation(s) within the areas targeted by this assay, and inadequate number of viral copies(<138 copies/mL). A negative result must be combined with clinical observations, patient history, and epidemiological information. The expected result is Negative.  Fact Sheet for Patients:  BloggerCourse.com  Fact Sheet for Healthcare Providers:  SeriousBroker.it  This test is no t yet approved or cleared by the [place] FDA and  has been authorized for detection and/or diagnosis of SARS-CoV-2 by FDA under an Emergency Use Authorization (EUA). This EUA will remain  in effect (meaning this test can be used) for the duration of the COVID-19 declaration under Section 564(b)(1) of the Act, 21 U.S.C.section 360bbb-3(b)(1), unless the authorization is terminated  or revoked sooner.       Influenza A by PCR NEGATIVE NEGATIVE Final   Influenza B by PCR NEGATIVE NEGATIVE Final    Comment: (NOTE) The Xpert Xpress SARS-CoV-2/FLU/RSV plus assay is intended as an aid in the diagnosis of influenza from Nasopharyngeal swab specimens and should not be used as a sole basis for treatment. Nasal washings and aspirates are unacceptable for Xpert Xpress SARS-CoV-2/FLU/RSV testing.  Fact Sheet for Patients: BloggerCourse.com  Fact Sheet for Healthcare Providers: SeriousBroker.it  This test is not yet approved or cleared by the [place] FDA and has been authorized for detection and/or diagnosis of SARS-CoV-2 by FDA under an Emergency Use Authorization (EUA). This EUA will remain in effect (meaning this test can be used) for the duration of the COVID-19 declaration under Section  564(b)(1) of the Act, 21 U.S.C. section 360bbb-3(b)(1), unless the authorization is terminated or revoked.  Performed at [facility], [address] [place], [place] [address]   Blood culture (routine x 2)     Status: Abnormal   Collection Time: 07/06/21  6:10 AM   Specimen: BLOOD  Result Value Ref Range Status   Specimen Description   Final    BLOOD LEFT AC Performed at [facility], [address] [place], [place] [address]    Special Requests   Final    BOTTLES DRAWN AEROBIC AND ANAEROBIC Blood Culture results may not be optimal due to an excessive volume of blood received in culture bottles Performed at [facility], [address] [place], [place] [address]    Culture  Setup Time   Final    GRAM POSITIVE RODS AEROBIC BOTTLE ONLY CRITICAL RESULT CALLED TO, READ BACK BY AND VERIFIED WITH: JASON [email or web address]  07/07/21 RH GRAM POSITIVE COCCI AEROBIC BOTTLE ONLY CRITICAL RESULT CALLED TO, READ BACK BY AND VERIFIED WITH: PHARM D A.CHAPPELL ON [id number] AT 0951 BY E.PARRISH    Culture (A)  Final    DIPHTHEROIDS(CORYNEBACTERIUM SPECIES) AEROCOCCUS URINAE Standardized susceptibility testing for this organism is not available. Performed at [facility], [address], [place], [place] [address]    Report Status 07/11/2021 FINAL  Final  Blood culture (routine x 2)     Status: None   Collection Time: 07/06/21  6:10 AM   Specimen: BLOOD  Result Value Ref Range Status   Specimen Description BLOOD LEFT HAND  Final   Special Requests   Final    BOTTLES DRAWN AEROBIC AND ANAEROBIC Blood Culture adequate volume   Culture   Final    NO GROWTH 5 DAYS Performed at [facility], [address] [place], [place] [address]    Report Status 07/11/2021 FINAL  Final  CULTURE, BLOOD (ROUTINE X 2) w Reflex to ID Panel     Status: None   Collection Time: 07/07/21 11:51 AM   Specimen: BLOOD  Result Value Ref Range Status   Specimen Description  BLOOD BLOOD RIGHT FOREARM  Final   Special Requests   Final    BOTTLES DRAWN AEROBIC ONLY Blood Culture results may not be optimal due to an inadequate volume of blood received in culture bottles   Culture   Final    NO GROWTH 5 DAYS Performed at [facility], [address] [place], [place] [address]    Report Status 07/12/2021 FINAL  Final  CULTURE, BLOOD (ROUTINE X 2) w Reflex to ID Panel     Status: None   Collection Time: 07/07/21 12:06 PM   Specimen: BLOOD  Result Value Ref Range Status   Specimen Description BLOOD BLOOD RIGHT HAND  Final   Special Requests   Final    BOTTLES DRAWN AEROBIC AND ANAEROBIC Blood Culture results may not be optimal due to an inadequate volume of blood received in culture bottles   Culture   Final    NO GROWTH 5 DAYS Performed at [facility], [address] [place], [place] [address]    Report Status 07/12/2021 FINAL  Final         Radiology Studies: No results found.      Scheduled Meds:  amLODipine  5 mg Oral Daily   aspirin EC  81 mg Oral Daily   atorvastatin  80 mg Oral q1800   cholecalciferol  2,000 Units Oral BH-q7a   feeding supplement  237 mL Oral TID BM   guaiFENesin  20 mL Oral Q4H   insulin aspart  0-5 Units Subcutaneous QHS   insulin aspart  0-9 Units Subcutaneous TID WC   metoprolol tartrate  75 mg Oral BID   mirabegron ER  25 mg Oral Daily   multivitamin with minerals  1 tablet Oral Daily   ondansetron (ZOFRAN) IV  4 mg Intravenous Once   polyethylene glycol  17 g Oral Daily   rivaroxaban  20 mg Oral Q supper   Continuous Infusions:   LOS: 6 days    Time spent: 44 MIN    Burke Keels, MD [facility]  If 7PM-7AM, please contact night-coverage  07/12/2021, 3:47 PM

## 2021-07-13 ENCOUNTER — Inpatient Hospital Stay: Payer: Medicare Other

## 2021-07-13 DIAGNOSIS — G9341 Metabolic encephalopathy: Secondary | ICD-10-CM | POA: Diagnosis not present

## 2021-07-13 DIAGNOSIS — I5032 Chronic diastolic (congestive) heart failure: Secondary | ICD-10-CM | POA: Diagnosis not present

## 2021-07-13 DIAGNOSIS — I4891 Unspecified atrial fibrillation: Secondary | ICD-10-CM | POA: Diagnosis not present

## 2021-07-13 DIAGNOSIS — J690 Pneumonitis due to inhalation of food and vomit: Secondary | ICD-10-CM | POA: Diagnosis not present

## 2021-07-13 LAB — BASIC METABOLIC PANEL
Anion gap: 6 (ref 5–15)
BUN: 26 mg/dL — ABNORMAL HIGH (ref 8–23)
CO2: 29 mmol/L (ref 22–32)
Calcium: 9.1 mg/dL (ref 8.9–10.3)
Chloride: 104 mmol/L (ref 98–111)
Creatinine, Ser: 1.02 mg/dL — ABNORMAL HIGH (ref 0.44–1.00)
GFR, Estimated: 59 mL/min — ABNORMAL LOW (ref >60)
Glucose, Bld: 193 mg/dL — ABNORMAL HIGH (ref 70–99)
Potassium: 4.1 mmol/L (ref 3.5–5.1)
Sodium: 139 mmol/L (ref 135–145)

## 2021-07-13 LAB — GLUCOSE, CAPILLARY
Glucose-Capillary: 179 mg/dL — ABNORMAL HIGH (ref 70–99)
Glucose-Capillary: 337 mg/dL — ABNORMAL HIGH (ref 70–99)
Glucose-Capillary: 179 mg/dL — ABNORMAL HIGH (ref 70–99)
Glucose-Capillary: 309 mg/dL — ABNORMAL HIGH (ref 70–99)

## 2021-07-13 LAB — CBC
HCT: 33 % — ABNORMAL LOW (ref 36.0–46.0)
Hemoglobin: 10.4 g/dL — ABNORMAL LOW (ref 12.0–15.0)
MCH: 27.8 pg (ref 26.0–34.0)
MCHC: 31.5 g/dL (ref 30.0–36.0)
MCV: 88.2 fL (ref 80.0–100.0)
Platelets: 293 10*3/uL (ref 150–400)
RBC: 3.74 MIL/uL — ABNORMAL LOW (ref 3.87–5.11)
RDW: 14.1 % (ref 11.5–15.5)
WBC: 6.3 10*3/uL (ref 4.0–10.5)
nRBC: 0 % (ref 0.0–0.2)

## 2021-07-13 MED ORDER — INSULIN ASPART 100 UNIT/ML IJ SOLN
3.0000 [IU] | Freq: Three times a day (TID) | INTRAMUSCULAR | Status: DC
  Administered 2021-07-13 – 2021-07-14 (×3): 3 [IU] via SUBCUTANEOUS
  Filled 2021-07-13 (×3): qty 1

## 2021-07-13 NOTE — TOC Progression Note (Signed)
Transition of Care [facility]) - Progression Note    Patient Details  Name: Abrial Arrighi MRN: [id number] Date of Birth: 1951-01-30  Transition of [facility]) CM/SW Contact  Chapman Fitch, RN Phone Number: 07/13/2021, 4:15 PM  Clinical Narrative:      PT note sent to Harriett Sine at [facility] Per Harriett Sine they will send someone tomorrow to evaluate  Expected Discharge Plan: Assisted Living Barriers to Discharge: Continued Medical Work up  Expected Discharge Plan and Services Expected Discharge Plan: Assisted Living       Living arrangements for the past 2 months: Assisted Living Facility                                       Social Determinants of Health (SDOH) Interventions    Readmission Risk Interventions No flowsheet data found.

## 2021-07-13 NOTE — Progress Notes (Signed)
Inpatient Diabetes Program Recommendations  AACE/ADA: New Consensus Statement on Inpatient Glycemic Control (2015)  Target Ranges:  Prepandial:   less than 140 mg/dL      Peak postprandial:   less than 180 mg/dL (1-2 hours)      Critically ill patients:  140 - 180 mg/dL   Lab Results  Component Value Date   GLUCAP 179 (H) 07/13/2021   HGBA1C 10.5 (H) 05/06/2021    Review of Glycemic Control Results for Leah Ritter, Leah Ritter (MRN [id number]) as of 07/13/2021 09:11  Ref. Range 07/12/2021 07:47 07/12/2021 11:51 07/12/2021 16:50 07/12/2021 21:01 07/13/2021 08:47  Glucose-Capillary Latest Ref Range: 70 - 99 mg/dL 762 (H) 831 (H) 517 (H) 297 (H) 179 (H)   Diabetes history: DM 2 Outpatient Diabetes medications: Glipizide 10 mg bid  Current orders for Inpatient glycemic control:  Novolog 0-9 units tid + hs  Ensure Enlive tid between meals A1c 10.5% on 8/10  Inpatient Diabetes Program Recommendations:    Glucose trends increase into the 300 range after supplements and PO intake  - Consider Novolog 3 units tid meal coverage if eating >50% of meals/supplements  Thanks,  Christena Deem RN, MSN, BC-ADM Inpatient Diabetes Coordinator Team Pager [phone] (8a-5p)

## 2021-07-13 NOTE — Care Management Important Message (Signed)
Important Message  Patient Details  Name: Leah Ritter MRN: [id number] Date of Birth: 1951/06/07   Medicare Important Message Given:  Yes     [name] 07/13/2021, 2:32 PM

## 2021-07-13 NOTE — TOC Progression Note (Signed)
Transition of Care [facility]) - Progression Note    Patient Details  Name: Leah Ritter MRN: [id number] Date of Birth: 02-Aug-1951  Transition of Care [facility]) CM/SW Contact  Chapman Fitch, RN Phone Number: 07/13/2021, 10:13 AM  Clinical Narrative:    Spoke with daughter [name]  and presented bed offers.  Daughter would like to see if patient can meet goals with PT today in order be be able to go back to [facility] at discharge    Expected Discharge Plan: Assisted Living Barriers to Discharge: Continued Medical Work up  Expected Discharge Plan and Services Expected Discharge Plan: Assisted Living       Living arrangements for the past 2 months: Assisted Living Facility                                       Social Determinants of Health (SDOH) Interventions    Readmission Risk Interventions No flowsheet data found.

## 2021-07-13 NOTE — Progress Notes (Signed)
PROGRESS NOTE    Leah Ritter  OEU:[id number] DOB: 1950/11/15 DOA: 07/06/2021 PCP: Mortimer Fries, PA   Brief Narrative:  Patient is a 70 year old obese African-American female with history of hypertension, hyperlipidemia, diabetes mellitus type 2, history of CVA, depression, anxiety and sleep apnea not on CPAP, history of CHF, A. fib on Xarelto, CKD stage IIIb a presented to hospital with altered mental status, nausea vomiting and possible aspiration.  Patient had difficulty with her speech and was unable to recognize her family and had worsening mental status.  In the ED she was barely arousable and did not follow any commands.  Initial WBC was 11.1 and lactate of 1.7.  Chest x-ray showed cardiomegaly and vascular congestion.  CT scan of abdomen pelvis was negative for acute issues.  CT head was negative for acute process.  Patient was then admitted hospital for acute metabolic encephalopathy likely secondary to aspiration pneumonia and was started on IV Unasyn.  Blood cultures showed gram-positive rods in 1 bottle which was likely contaminant.  Patient received antibiotic during hospitalization for pneumonia.    Assessment & Plan:   Principal Problem:   Acute metabolic encephalopathy Active Problems:   HLD (hyperlipidemia)   Hypothermia   Type II diabetes mellitus with renal manifestations (HCC)   Stroke (HCC)   Hypertension   A-fib (HCC)   Chronic diastolic CHF (congestive heart failure) (HCC)   Depression with anxiety   CKD (chronic kidney disease), stage IIIb   Aspiration pneumonia (HCC)   Nausea & vomiting   Acute Metabolic Encephalopathy superimposed on Chronic Dementia Thought to be delirium superimposed on dementia.  CT head negative for acute intracranial abnormalities.  Polypharmacy on the differential diagnosis.  Currently hold on now.  Psych was consulted but recommended no intervention and has signed off.  Patient continues to [facility] but oriented to self.  Could be her  baseline at this time   Sepsis secondary to aspiration PNA Improved at this time.  Was on Unasyn.  Received flutter valve incentive spirometry.  Speech therapy evaluated patient and recommended pured diet    Hyperlipidemia Continue Lipitor.   Hypothermia: Resolved at this time.   Type II diabetes mellitus with chronic kidney disease. Recent hemoglobin A1c 10.5, takes glipizide as outpatient.  Continue sliding scale insulin while in the hospital.   History of stroke  CT scan of the head showed chronic CVA.  Continue aspirin Lipitor.   Essential hypertension Continue metoprolol   Hyponatremia Resolved.  Latest sodium of 139.   Atrial fibrillation. Continue metoprolol and Xarelto.     Chronic Diastolic heart failure: 2D echocardiogram on 05/07/2021 showed EF of 75% with grade 1 diastolic dysfunction.  Chest x-ray showed cardiomegaly with pulmonary vascular congestion. Continue to hold Lasix, spironolactone   Depression with anxiety Xanax, Remeron, Seroquel hold due to polypharmacy.  Chronic kidney disease, stage IIIb BMP stable at this time.  Continue to monitor   Nausea & Vomiting Improved.  CT scan of the abdomen and pelvis was negative for acute intra-abdominal process.  Lipase was within normal limits.  Continue IV Zofran   Obesity Patient would benefit from continued weight loss as outpatient.   DVT prophylaxis: [id number]    Code Status: DNR   Family Communication:  Spoke with the patient's daughter about the potential disposition soon.  Waiting on PT to see the patient with the need goals for transfer back to memory care.  Disposition Plan:  Assisted living facility  Consults called:  Psychiatry  Admission status: Inpatient  Consultants:  PT, OT, psychiatry  Procedures:  None.  Subjective: Patient was seen and examined at bedside.  Patient continues to [facility] but able to answer few questions.  Denies any shortness of breath or difficulty  breathing.  Objective: Vitals:   07/12/21 1941 07/13/21 0437 07/13/21 0454 07/13/21 0849  BP: (!) 150/74 (!) 141/67  135/69  Pulse: 88 71  80  Resp: 17 16    Temp: 97.9 F (36.6 C) 98.1 F (36.7 C)  97.9 F (36.6 C)  TempSrc: Oral Oral    SpO2: 100% 99%  100%  Weight:   94.2 kg   Height:        Intake/Output Summary (Last 24 hours) at 07/13/2021 1349 Last data filed at 07/12/2021 2038 Gross per 24 hour  Intake 240 ml  Output --  Net 240 ml    Filed Weights   07/11/21 0512 07/12/21 0549 07/13/21 0454  Weight: 96.2 kg 96.8 kg 94.2 kg   Body mass index is 29.8 kg/m.   Physical examination: General: Obese built, not in obvious distress continues to mumble constantly.  On room air. HENT:   No scleral pallor or icterus noted. Oral mucosa is moist.  Chest:  Clear breath sounds.  Diminished breath sounds bilaterally. No crackles or wheezes.  CVS: S1 &S2 heard. No murmur.  Regular rate and rhythm. Abdomen: Soft, nontender, nondistended.  Bowel sounds are heard.   Extremities: No cyanosis, clubbing or edema.  Peripheral pulses are palpable. Psych: Alert, awake and communicative, mumbling speech. CNS: Mumbling speech.  Moves all extremities.  Oriented to self.  Has baseline dementia. Skin: Warm and dry.  No rashes noted.  Data Reviewed: I have personally reviewed the following labs and imaging studies   CBC: Recent Labs  Lab 07/08/21 0525 07/09/21 0644 07/10/21 0828 07/11/21 0411 07/12/21 0659 07/13/21 0428  WBC 9.0 8.7 8.1 6.3 6.6 6.3  NEUTROABS 6.2  --   --   --   --   --   HGB 10.0* 10.5* 10.5* 10.5* 10.6* 10.4*  HCT 30.0* 31.7* 31.6* 33.0* 32.4* 33.0*  MCV 88.8 89.3 89.0 88.2 88.8 88.2  PLT 238 233 238 255 297 293    Basic Metabolic Panel: Recent Labs  Lab 07/08/21 0525 07/09/21 0644 07/10/21 0828 07/11/21 0411 07/11/21 1748 07/12/21 0516 07/13/21 0428  NA 138 139 139 141  --  139 139  K 4.1 4.3 4.3 3.8  --  5.1 4.1  CL 105 106 106 106  --  107 104   CO2 25 26 25 27   --  20* 29  GLUCOSE 136* 139* 153* 158* 397* 139* 193*  BUN 20 20 20 19   --  26* 26*  CREATININE 1.07* 0.88 1.05* 0.98  --  0.99 1.02*  CALCIUM 8.5* 9.1 9.0 9.1  --  8.9 9.1  MG 1.7 2.0 2.0 2.0  --   --   --   PHOS 2.7 3.4 3.6 2.9  --   --   --     GFR: Estimated Creatinine Clearance: 62.9 mL/min (A) (by C-G formula based on SCr of 1.02 mg/dL (H)). Liver Function Tests: Recent Labs  Lab 07/08/21 0525  AST 28  ALT 35  ALKPHOS 93  BILITOT 0.7  PROT 6.6  ALBUMIN 2.5*    No results for input(s): LIPASE, AMYLASE in the last 168 hours.  No results for input(s): AMMONIA in the last 168 hours. Coagulation Profile: No results for input(s): INR, PROTIME in the last 168 hours.  Cardiac Enzymes: No results for input(s): CKTOTAL, CKMB, CKMBINDEX, TROPONINI in the last 168 hours. BNP (last 3 results) No results for input(s): PROBNP in the last 8760 hours. HbA1C: No results for input(s): HGBA1C in the last 72 hours. CBG: Recent Labs  Lab 07/12/21 1151 07/12/21 1650 07/12/21 2101 07/13/21 0847 07/13/21 1206  GLUCAP 362* 330* 297* 179* 337*    Lipid Profile: No results for input(s): CHOL, HDL, LDLCALC, TRIG, CHOLHDL, LDLDIRECT in the last 72 hours. Thyroid Function Tests: No results for input(s): TSH, T4TOTAL, FREET4, T3FREE, THYROIDAB in the last 72 hours. Anemia Panel: No results for input(s): VITAMINB12, FOLATE, FERRITIN, TIBC, IRON, RETICCTPCT in the last 72 hours. Sepsis Labs: Recent Labs  Lab 07/07/21 0533 07/08/21 0525  PROCALCITON <0.10 <0.10     Recent Results (from the past 240 hour(s))  Resp Panel by RT-PCR (Flu A&B, Covid) Nasopharyngeal Swab     Status: None   Collection Time: 07/06/21  6:09 AM   Specimen: Nasopharyngeal Swab; Nasopharyngeal(NP) swabs in vial transport medium  Result Value Ref Range Status   SARS Coronavirus 2 by RT PCR NEGATIVE NEGATIVE Final    Comment: (NOTE) SARS-CoV-2 target nucleic acids are NOT DETECTED.  The  SARS-CoV-2 RNA is generally detectable in upper respiratory specimens during the acute phase of infection. The lowest concentration of SARS-CoV-2 viral copies this assay can detect is 138 copies/mL. A negative result does not preclude SARS-Cov-2 infection and should not be used as the sole basis for treatment or other patient management decisions. A negative result may occur with  improper specimen collection/handling, submission of specimen other than nasopharyngeal swab, presence of viral mutation(s) within the areas targeted by this assay, and inadequate number of viral copies(<138 copies/mL). A negative result must be combined with clinical observations, patient history, and epidemiological information. The expected result is Negative.  Fact Sheet for Patients:  BloggerCourse.com  Fact Sheet for Healthcare Providers:  SeriousBroker.it  This test is no t yet approved or cleared by the [place] FDA and  has been authorized for detection and/or diagnosis of SARS-CoV-2 by FDA under an Emergency Use Authorization (EUA). This EUA will remain  in effect (meaning this test can be used) for the duration of the COVID-19 declaration under Section 564(b)(1) of the Act, 21 U.S.C.section 360bbb-3(b)(1), unless the authorization is terminated  or revoked sooner.       Influenza A by PCR NEGATIVE NEGATIVE Final   Influenza B by PCR NEGATIVE NEGATIVE Final    Comment: (NOTE) The Xpert Xpress SARS-CoV-2/FLU/RSV plus assay is intended as an aid in the diagnosis of influenza from Nasopharyngeal swab specimens and should not be used as a sole basis for treatment. Nasal washings and aspirates are unacceptable for Xpert Xpress SARS-CoV-2/FLU/RSV testing.  Fact Sheet for Patients: BloggerCourse.com  Fact Sheet for Healthcare Providers: SeriousBroker.it  This test is not yet approved or  cleared by the [place] FDA and has been authorized for detection and/or diagnosis of SARS-CoV-2 by FDA under an Emergency Use Authorization (EUA). This EUA will remain in effect (meaning this test can be used) for the duration of the COVID-19 declaration under Section 564(b)(1) of the Act, 21 U.S.C. section 360bbb-3(b)(1), unless the authorization is terminated or revoked.  Performed at [facility], [address] [place], [place] [address]   Blood culture (routine x 2)     Status: Abnormal   Collection Time: 07/06/21  6:10 AM   Specimen: BLOOD  Result Value Ref Range Status   Specimen  Description   Final    BLOOD LEFT AC Performed at [facility], [address] [place], [place] [address]    Special Requests   Final    BOTTLES DRAWN AEROBIC AND ANAEROBIC Blood Culture results may not be optimal due to an excessive volume of blood received in culture bottles Performed at [facility], [address] [place], [place] [address]    Culture  Setup Time   Final    GRAM POSITIVE RODS AEROBIC BOTTLE ONLY CRITICAL RESULT CALLED TO, READ BACK BY AND VERIFIED WITH: JASON ROBBINS@[email or web address]  07/07/21 RH GRAM POSITIVE COCCI AEROBIC BOTTLE ONLY CRITICAL RESULT CALLED TO, READ BACK BY AND VERIFIED WITH: PHARM D A.CHAPPELL ON [id number] AT 0951 BY E.PARRISH    Culture (A)  Final    DIPHTHEROIDS(CORYNEBACTERIUM SPECIES) AEROCOCCUS URINAE Standardized susceptibility testing for this organism is not available. Performed at [facility], [address] [place], [place] [address]    Report Status 07/11/2021 FINAL  Final  Blood culture (routine x 2)     Status: None   Collection Time: 07/06/21  6:10 AM   Specimen: BLOOD  Result Value Ref Range Status   Specimen Description BLOOD LEFT HAND  Final   Special Requests   Final    BOTTLES DRAWN AEROBIC AND ANAEROBIC Blood Culture adequate volume   Culture   Final    NO GROWTH 5 DAYS Performed at [facility], [address] [place], [place] [address]    Report Status 07/11/2021 FINAL  Final  CULTURE, BLOOD (ROUTINE X 2) w Reflex to ID Panel     Status: None   Collection Time: 07/07/21 11:51 AM   Specimen: BLOOD  Result Value Ref Range Status   Specimen Description BLOOD BLOOD RIGHT FOREARM  Final   Special Requests   Final    BOTTLES DRAWN AEROBIC ONLY Blood Culture results may not be optimal due to an inadequate volume of blood received in culture bottles   Culture   Final    NO GROWTH 5 DAYS Performed at [facility], [address] [place], [place] [address]    Report Status 07/12/2021 FINAL  Final  CULTURE, BLOOD (ROUTINE X 2) w Reflex to ID Panel     Status: None   Collection Time: 07/07/21 12:06 PM   Specimen: BLOOD  Result Value Ref Range Status   Specimen Description BLOOD BLOOD RIGHT HAND  Final   Special Requests   Final    BOTTLES DRAWN AEROBIC AND ANAEROBIC Blood Culture results may not be optimal due to an inadequate volume of blood received in culture bottles   Culture   Final    NO GROWTH 5 DAYS Performed at [facility], [address], [place], [place] [address]    Report Status 07/12/2021 FINAL  Final     Radiology Studies: DG Chest Port 1 View  Result Date: 07/13/2021 CLINICAL DATA:  Aspiration pneumonia EXAM: PORTABLE CHEST 1 VIEW COMPARISON:  Radiograph 07/08/2021 FINDINGS: Unchanged, enlarged cardiac silhouette. Low lung volumes. Stable diffuse interstitial opacities. No new airspace disease. No large pleural effusion or visible pneumothorax. No acute osseous abnormality. IMPRESSION: Stable mild pulmonary edema.  No new airspace disease. Electronically Signed   By: Caprice Renshaw M.D.   On: 07/13/2021 10:05    Scheduled Meds:  amLODipine  5 mg Oral Daily   aspirin EC  81 mg Oral Daily   atorvastatin  80 mg Oral q1800   cholecalciferol  2,000 Units  Oral BH-q7a   feeding supplement  237 mL Oral TID BM   guaiFENesin  20 mL Oral  Q4H   insulin aspart  0-5 Units Subcutaneous QHS   insulin aspart  0-9 Units Subcutaneous TID WC   metoprolol tartrate  75 mg Oral BID   mirabegron ER  25 mg Oral Daily   multivitamin with minerals  1 tablet Oral Daily   ondansetron (ZOFRAN) IV  4 mg Intravenous Once   polyethylene glycol  17 g Oral Daily   rivaroxaban  20 mg Oral Q supper   Continuous Infusions:   LOS: 7 days   Joycelyn Das, MD [facility] If 7PM-7AM, please contact night-coverage 07/13/2021, 1:49 PM

## 2021-07-13 NOTE — Progress Notes (Signed)
Physical Therapy Treatment Patient Details Name: Leah Ritter MRN: [id number] DOB: 1951-03-07 Today's Date: 07/13/2021   History of Present Illness 70 y.o. female with medical history significant for Dementia per chart notes needing assistance w/ ADLs at NH, hypertension, hyperlipidemia, diabetes mellitus, stroke, depression, anxiety, OSA not on CPAP, CHF, atrial fibrillation on Xarelto, CKD-3B, who presents with altered mental status, nausea, vomiting, possible aspiration of vomit.  Pt's previous admit in 04/2021 for AKI.  CXR at admit: Cardiomegaly with unchanged to slightly increased pulmonary vascular  congestion/mild edema.    PT Comments    Pt seen for PT tx with pt very confused, requiring max multimodal cuing to initiate mobility & for safety with gait with use of RW. Pt is able to ambulate in room with RW & min assist, mod assist to correct 1 LOB. Pt requires frequent redirection. Pt appears primarily limited by impaired cognition, followed by weakness. Due to improvement & pt able to ambulate with +1 assist in room, have changed recommendations to reflect 24 hr supervision & HHPT.    Recommendations for follow up therapy are one component of a multi-disciplinary discharge planning process, led by the attending physician.  Recommendations may be updated based on patient status, additional functional criteria and insurance authorization.  Follow Up Recommendations  Home health PT;Supervision/Assistance - 24 hour     Equipment Recommendations  None recommended by PT    Recommendations for Other Services       Precautions / Restrictions Precautions Precautions: Fall Restrictions Weight Bearing Restrictions: No     Mobility  Bed Mobility Overal bed mobility: Needs Assistance Bed Mobility: Supine to Sit;Sit to Supine     Supine to sit: Min assist;HOB elevated (tactile cuing to initiate movement (PT moved BLE to EOB) otherwise pt able to perform without physical  assistance.) Sit to supine: Supervision;HOB elevated        Transfers Overall transfer level: Needs assistance Equipment used: Rolling walker (2 wheeled) Transfers: Sit to/from Stand Sit to Stand: Min assist         General transfer comment: Pt initiates sit>stand, poor awareness of safe hand placement  Ambulation/Gait Ambulation/Gait assistance: Min assist Gait Distance (Feet): 25 Feet Assistive device: Rolling walker (2 wheeled)       General Gait Details: Pt requires frequent redirection as she reaches for various objects (trash can, counter), requires MAX multimodal cuing for safety, mod assist x1 occasion to correct LOB.   Stairs             Wheelchair Mobility    Modified Rankin (Stroke Patients Only)       Balance Overall balance assessment: Needs assistance Sitting-balance support: No upper extremity supported;Feet supported Sitting balance-Leahy Scale: Fair     Standing balance support: Bilateral upper extremity supported Standing balance-Leahy Scale: Poor                              Cognition Arousal/Alertness: Awake/alert Behavior During Therapy: Flat affect Overall Cognitive Status: History of cognitive impairments - at baseline                                 General Comments: Tangential but garbled speech. Follows commands 25% of the time with mod/max mulitmodal cuing. Requires frequent, if not constant, redirection.      Exercises      General Comments  Pertinent Vitals/Pain Pain Assessment: Faces Faces Pain Scale: No hurt    Home Living                      Prior Function            PT Goals (current goals can now be found in the care plan section) Acute Rehab PT Goals Patient Stated Goal: per daughter, to walk PT Goal Formulation: With patient/family Time For Goal Achievement: 07/23/21 Potential to Achieve Goals: Fair Progress towards PT goals: Progressing toward goals     Frequency    Min 2X/week      PT Plan Discharge plan needs to be updated    Co-evaluation              AM-PAC PT "6 Clicks" Mobility   Outcome Measure  Help needed turning from your back to your side while in a flat bed without using bedrails?: A Little Help needed moving from lying on your back to sitting on the side of a flat bed without using bedrails?: A Little Help needed moving to and from a bed to a chair (including a wheelchair)?: A Little Help needed standing up from a chair using your arms (e.g., wheelchair or bedside chair)?: A Little Help needed to walk in hospital room?: A Lot Help needed climbing 3-5 steps with a railing? : A Lot 6 Click Score: 16    End of Session Equipment Utilized During Treatment: Gait belt Activity Tolerance: Patient tolerated treatment well Patient left: in bed;with call bell/phone within reach;with bed alarm set   PT Visit Diagnosis: Muscle weakness (generalized) (M62.81);Difficulty in walking, not elsewhere classified (R26.2);Unsteadiness on feet (R26.81)     Time: 2197-5883 PT Time Calculation (min) (ACUTE ONLY): 9 min  Charges:  $Therapeutic Activity: 8-22 mins                     Aleda Grana, PT, DPT 07/13/21, 3:54 PM    Sandi Mariscal 07/13/2021, 3:53 PM

## 2021-07-13 NOTE — TOC Progression Note (Signed)
Transition of Care [facility]) - Progression Note    Patient Details  Name: Leah Ritter MRN: [id number] Date of Birth: 1951-05-29  [facility]) CM/SW Contact  Chapman Fitch, RN Phone Number: 07/13/2021, 3:38 PM  Clinical Narrative:      Per Harriett Sine at [facility].  If patient is able able to be 1 person assistance TOC to email her PT note to [email or web address] for her to review, and someone to come to assess patient again.   PT notified  Expected Discharge Plan: Assisted Living Barriers to Discharge: Continued Medical Work up  Expected Discharge Plan and Services Expected Discharge Plan: Assisted Living       Living arrangements for the past 2 months: Assisted Living Facility                                       Social Determinants of Health (SDOH) Interventions    Readmission Risk Interventions No flowsheet data found.

## 2021-07-14 DIAGNOSIS — G9341 Metabolic encephalopathy: Secondary | ICD-10-CM | POA: Diagnosis not present

## 2021-07-14 DIAGNOSIS — I4891 Unspecified atrial fibrillation: Secondary | ICD-10-CM | POA: Diagnosis not present

## 2021-07-14 DIAGNOSIS — J690 Pneumonitis due to inhalation of food and vomit: Secondary | ICD-10-CM | POA: Diagnosis not present

## 2021-07-14 DIAGNOSIS — T68XXXA Hypothermia, initial encounter: Secondary | ICD-10-CM | POA: Diagnosis not present

## 2021-07-14 LAB — GLUCOSE, CAPILLARY
Glucose-Capillary: 204 mg/dL — ABNORMAL HIGH (ref 70–99)
Glucose-Capillary: 225 mg/dL — ABNORMAL HIGH (ref 70–99)

## 2021-07-14 MED ORDER — BISACODYL 10 MG RE SUPP: 10.0000 mg | Freq: Every day | RECTAL | Status: DC | PRN

## 2021-07-14 MED ORDER — POLYETHYLENE GLYCOL 3350 17 G PO PACK: 17.0000 g | PACK | Freq: Every day | ORAL | Status: DC | PRN

## 2021-07-14 MED ORDER — GUAIFENESIN 100 MG/5ML PO SOLN: 20.0000 mL | ORAL | 0 refills | Status: DC

## 2021-07-14 MED ORDER — ENSURE ENLIVE PO LIQD: 237.0000 mL | Freq: Three times a day (TID) | ORAL | Status: AC

## 2021-07-14 MED ORDER — ADULT MULTIVITAMIN W/MINERALS CH: 1.0000 | ORAL_TABLET | Freq: Every day | ORAL | Status: DC

## 2021-07-14 MED ORDER — ALPRAZOLAM 0.25 MG PO TABS: 0.2500 mg | ORAL_TABLET | Freq: Two times a day (BID) | ORAL | 0 refills | Status: DC

## 2021-07-14 MED ORDER — AMLODIPINE BESYLATE 5 MG PO TABS: 5.0000 mg | ORAL_TABLET | Freq: Every day | ORAL | Status: DC

## 2021-07-14 MED ORDER — DM-GUAIFENESIN ER 30-600 MG PO TB12: 1.0000 | ORAL_TABLET | Freq: Two times a day (BID) | ORAL | Status: AC | PRN

## 2021-07-14 NOTE — TOC Progression Note (Addendum)
Transition of Care [facility]) - Progression Note    Patient Details  Name: Leah Ritter MRN: [id number] Date of Birth: July 16, 1951  [facility]) CM/SW Contact  Margarito Liner, LCSW Phone Number: 07/14/2021, 11:37 AM  Clinical Narrative:   Elouise Munroe, wellness director at [facility] said she has not received an update from the assessment that was done this morning. Asked her to call back once she receives update.  1:14 pm: [facility] is able to accept patient back and needs discharge paperwork no later than 3:00 so they can send everything into pharmacy. MD and RN aware. Preferred HH agency is [facility].  1:59 pm: Updated daughter. She prefers not to work with [facility] due to therapy she received last time. Left message for [facility] representative to see if they can accept referral. Sent FL2 and discharge summary to [facility] for review.  2:43 pm: [facility] is able to accept for HHPT.  Expected Discharge Plan: Assisted Living Barriers to Discharge: Continued Medical Work up  Expected Discharge Plan and Services Expected Discharge Plan: Assisted Living       Living arrangements for the past 2 months: Assisted Living Facility                                       Social Determinants of Health (SDOH) Interventions    Readmission Risk Interventions No flowsheet data found.

## 2021-07-14 NOTE — Progress Notes (Signed)
Patient's IV was out on assessment. MD Katrinka Blazing aware and ok to leave patient without an IV.

## 2021-07-14 NOTE — TOC Transition Note (Signed)
Transition of Care [facility]) - CM/SW Discharge Note   Patient Details  Name: Kayal Mula MRN: [id number] Date of Birth: [birth year]  Transition of Care [facility]) CM/SW Contact:  Margarito Liner, LCSW Phone Number: 07/14/2021, 3:03 PM   Clinical Narrative: Patient has orders to discharge back to [facility] today. Lucila Maine will pick her up. RN will call him to coordinate. No further concerns. CSW signing off.    Final next level of care: Memory Care (with home health) Barriers to Discharge: Barriers Resolved   Patient Goals and CMS Choice Patient states their goals for this hospitalization and ongoing recovery are:: to go home CMS Medicare.gov Compare Post Acute Care list provided to:: Patient Represenative (must comment) Choice offered to / list presented to : Adult Children  Discharge Placement                Patient to be transferred to facility by: Philis Nettle Name of family member notified: Fredric Mare Patient and family notified of of transfer: 07/14/21  Discharge Plan and Services                          HH Arranged: PT [facility] Agency: [facility] Date [facility] Agency Contacted: 07/14/21   Representative spoke with at [facility] Agency: Lorenza Chick  Social Determinants of Health (SDOH) Interventions     Readmission Risk Interventions No flowsheet data found.

## 2021-07-14 NOTE — Progress Notes (Signed)
Occupational Therapy Treatment Patient Details Name: Leah Ritter MRN: [id number] DOB: 19-Sep-1951 Today's Date: 07/14/2021   History of present illness 70 y.o. female with medical history significant for Dementia per chart notes needing assistance w/ ADLs at NH, hypertension, hyperlipidemia, diabetes mellitus, stroke, depression, anxiety, OSA not on CPAP, CHF, atrial fibrillation on Xarelto, CKD-3B, who presents with altered mental status, nausea, vomiting, possible aspiration of vomit.  Pt's previous admit in 04/2021 for AKI.  CXR at admit: Cardiomegaly with unchanged to slightly increased pulmonary vascular  congestion/mild edema.   OT comments  Upon entering the room, pt seated in recliner chair with no signs or symptoms of pain. Pt with clearer speech this session and engaging in functional conversation appropriately. Pt then begins to have garbled speech and very tangential in nature requiring max cuing for redirection. Pt standing with min guard and returning back to recliner chair reporting dizziness. Pt agreeable to washing face and performed hand and face hygiene with set up A and min cuing. Pt appears to be very sleepy and closes eyes to sleep. She is agreeable to standing with min A and returning to bed. Pt needing max multimodal cuing to return to bed once standing. All need within reach. Pt sleeping soundly as therapist exits the room.    Recommendations for follow up therapy are one component of a multi-disciplinary discharge planning process, led by the attending physician.  Recommendations may be updated based on patient status, additional functional criteria and insurance authorization.    Follow Up Recommendations  Other (comment) (return to memory care)    Equipment Recommendations  None recommended by OT       Precautions / Restrictions Precautions Precautions: Fall Restrictions Weight Bearing Restrictions: No       Mobility Bed Mobility Overal bed mobility: Needs  Assistance Bed Mobility: Sit to Supine       Sit to supine: Supervision;HOB elevated   General bed mobility comments: received standing edge of bed with mobility specialist present; in recliner end of treatment session    Transfers Overall transfer level: Needs assistance Equipment used: 1 person hand held assist Transfers: Sit to/from Stand Sit to Stand: Min guard;Min assist Stand pivot transfers: Min guard;Min assist       General transfer comment: sit/stand from variety of surfaces (standard toilet with grab bar, recliner)    Balance Overall balance assessment: Needs assistance Sitting-balance support: No upper extremity supported;Feet supported Sitting balance-Leahy Scale: Good     Standing balance support: Bilateral upper extremity supported Standing balance-Leahy Scale: Fair                             ADL either performed or assessed with clinical judgement   ADL Overall ADL's : Needs assistance/impaired     Grooming: Wash/dry hands;Wash/dry face;Set up;Supervision/safety;Sitting Grooming Details (indicate cue type and reason): Pt needing min cuing to initiate task                                     Vision Patient Visual Report: No change from baseline            Cognition Arousal/Alertness: Awake/alert Behavior During Therapy: Flat affect Overall Cognitive Status: History of cognitive impairments - at baseline  General Comments: tangential speech with garbled speech. Pt with mod - max multimodal cuing.        Exercises Other Exercises Other Exercises: Toilet transfer, ambulatory with RW, cga/close sup; sit/stand from standard toilet height with grab bar, cga/close sup.  Dep assist for hygiene.  Dep assist for clothing change (patient with incontinent bladder episode with initial stand from bed); constantly grabbing/manipulating clothes to maintain modesty during hygiene/clothing  change. Other Exercises: Representative from [facility] again present for session to observe patient's performance and level of assist required.  Able to visualize patient completing sit/stand, basic transfers and in-room gait with 4WRW, cga/close sup; voices movement is similar to baseline. No additional observations, tasks required.           Pertinent Vitals/ Pain       Pain Assessment: Faces Faces Pain Scale: No hurt         Frequency  Min 2X/week        Progress Toward Goals  OT Goals(current goals can now be found in the care plan section)  Progress towards OT goals: Progressing toward goals  Acute Rehab OT Goals Patient Stated Goal: none stated OT Goal Formulation: Patient unable to participate in goal setting Time For Goal Achievement: 07/23/21 Potential to Achieve Goals: Good  Plan Discharge plan remains appropriate;Frequency remains appropriate       AM-PAC OT "6 Clicks" Daily Activity     Outcome Measure   Help from another person eating meals?: A Little Help from another person taking care of personal grooming?: A Little Help from another person toileting, which includes using toliet, bedpan, or urinal?: A Little Help from another person bathing (including washing, rinsing, drying)?: A Little Help from another person to put on and taking off regular upper body clothing?: A Little Help from another person to put on and taking off regular lower body clothing?: A Little 6 Click Score: 18    End of Session Equipment Utilized During Treatment: Gait belt;Rolling walker  OT Visit Diagnosis: Unsteadiness on feet (R26.81)   Activity Tolerance Patient tolerated treatment well;Other (comment)   Patient Left in bed;with call bell/phone within reach;Other (comment);with family/visitor present   Nurse Communication Mobility status        Time: 2774-1287 OT Time Calculation (min): 23 min  Charges: OT General Charges $OT Visit: 1 Visit OT Treatments $Self  Care/Home Management : 23-37 mins  Jackquline Denmark, MS, OTR/L , CBIS ascom [phone]  07/14/21, 3:20 PM

## 2021-07-14 NOTE — Progress Notes (Signed)
Inpatient Diabetes Program Recommendations  AACE/ADA: New Consensus Statement on Inpatient Glycemic Control (2015)  Target Ranges:  Prepandial:   less than 140 mg/dL      Peak postprandial:   less than 180 mg/dL (1-2 hours)      Critically ill patients:  140 - 180 mg/dL   Lab Results  Component Value Date   GLUCAP 204 (H) 07/14/2021   HGBA1C 10.5 (H) 05/06/2021    Review of Glycemic Control Results for VALDA, CHRISTENSON (MRN [id number]) as of 07/14/2021 10:04  Ref. Range 07/13/2021 08:47 07/13/2021 12:06 07/13/2021 16:37 07/13/2021 20:59 07/14/2021 07:33  Glucose-Capillary Latest Ref Range: 70 - 99 mg/dL 774 (H)  Novolog 2 units 337 (H)  Novolog 7 units 179 (H)  Novolog 5 units 309 (H)  Novolog 4 units 204 (H)  Novolog 6 units    Diabetes history: DM 2 Outpatient Diabetes medications: Glipizide 10 mg bid  Current orders for Inpatient glycemic control:  Novolog 0-9 units tid + hs  Ensure Enlive tid between meals A1c 10.5% on 8/10  Inpatient Diabetes Program Recommendations:    - Consider increasing Novolog meal coverage to Novolog 5 units tid meal coverage if eating >50% of meals/supplements  Thanks,  Christena Deem RN, MSN, BC-ADM Inpatient Diabetes Coordinator Team Pager [phone] (8a-5p)

## 2021-07-14 NOTE — Progress Notes (Signed)
Mobility Specialist - Progress Note   07/14/21 1430  Mobility  Activity Stood at bedside  Level of Assistance Minimal assist, patient does 75% or more  Assistive Device Front wheel walker  Distance Ambulated (ft) 5 ft  Mobility Ambulated with assistance in room  Mobility Response Tolerated well  Mobility performed by Mobility specialist  $Mobility charge 1 Mobility    Sat EOB with tactile/verbal cueing to initiate tasks. MinA to stand and ambulate a few steps toward window.    Filiberto Pinks Mobility Specialist 07/14/21, 2:32 PM

## 2021-07-14 NOTE — Progress Notes (Signed)
Nutrition Follow-up  DOCUMENTATION CODES:   Obesity unspecified  INTERVENTION:   Ensure Enlive po TID, each supplement provides 350 kcal and 20 grams of protein  MVI po daily   NUTRITION DIAGNOSIS:   Inadequate oral intake related to lethargy/confusion as evidenced by per patient/family report.  GOAL:   Patient will meet greater than or equal to 90% of their needs -progressing   MONITOR:   PO intake, Supplement acceptance, Labs, Weight trends, I & O's, Skin  ASSESSMENT:   70 y/o female with a PMH of dementia, HTN, HLD, T2DM, CVA, depression/anxiety, OSA, chronic CHF, A-fib and CKD stage 3b who presents with acute metabolic encephalopathy and sepsis secondary to aspiration PNA.  Unable to speak with pt r/t dementia. Per chart review, pt eating anywhere from sips/bites to 100% of meals in hospital; pt ate 40% of her breakfast today and ate 75% of her dinner last night. Spoke with RD, pt drinking strawberry Ensure with encouragement. Pt also requiring assistance with meals. SLP following; pt remains on pureed diet. Per chart, pt appears fairly weight stable since admission. Plan is for patient to return back to memory care unit upon discharge.   Medications reviewed and include: aspirin, D3, insulin, MVI, miralax  Labs reviewed: K 4.1 wnl, BUN 26(H), creat 1.02(H) P 2.9 wnl, Mg 2.0 wnl- 10/15 Hgb 10.4(L), Hct 33.0(L) Cbgs- 204, 225 x 24 hrs  Diet Order:   Diet Order             Diet general           DIET - DYS 1 Room service appropriate? Yes with Assist; Fluid consistency: Thin  Diet effective now                  EDUCATION NEEDS:   Not appropriate for education at this time  Skin:  Skin Assessment: Reviewed RN Assessment  Last BM:  10/17- type 6  Height:   Ht Readings from Last 1 Encounters:  07/06/21 5\' 10"  (1.778 m)    Weight:   Wt Readings from Last 1 Encounters:  07/14/21 96 kg   BMI:  Body mass index is 30.37 kg/m.  Estimated Nutritional  Needs:   Kcal:  1900-2200kcal/day  Protein:  95-110g/day  Fluid:  1.7-2.0L/day  07/16/21 MS, RD, LDN Please refer to [facility] for RD and/or RD on-call/weekend/after hours pager

## 2021-07-14 NOTE — Progress Notes (Signed)
Physical Therapy Treatment Patient Details Name: Leah Ritter MRN: [id number] DOB: [birth year] Today's Date: 07/14/2021   History of Present Illness 70 y.o. female with medical history significant for Dementia per chart notes needing assistance w/ ADLs at NH, hypertension, hyperlipidemia, diabetes mellitus, stroke, depression, anxiety, OSA not on CPAP, CHF, atrial fibrillation on Xarelto, CKD-3B, who presents with altered mental status, nausea, vomiting, possible aspiration of vomit.  Pt's previous admit in 04/2021 for AKI.  CXR at admit: Cardiomegaly with unchanged to slightly increased pulmonary vascular  congestion/mild edema.    PT Comments    Patient alert, agreeable to participation with basic mobility tasks this date.  Noted with episode of bladder incontinence with initial standing in room (patient unable to effectively communicate need to empty bladder); ambulated to toilet with therapist for completion of toileting needs.  Able to complete all mobility tasks with no greater than cga/min assist +1 this session.  Continues to thrive and perform optimally when mobility is self-initiated and within normal routine/   Representative from [facility] again present for session to observe patient's performance and level of assist required.  Able to visualize patient completing sit/stand, basic transfers and in-room gait with 4WRW, cga/close sup; voices movement is similar to baseline. No additional observations, tasks required per representative.    Recommendations for follow up therapy are one component of a multi-disciplinary discharge planning process, led by the attending physician.  Recommendations may be updated based on patient status, additional functional criteria and insurance authorization.  Follow Up Recommendations  Home health PT;Supervision/Assistance - 24 hour     Equipment Recommendations       Recommendations for Other Services       Precautions / Restrictions  Precautions Precautions: Fall Restrictions Weight Bearing Restrictions: No     Mobility  Bed Mobility               General bed mobility comments: received standing edge of bed with mobility specialist present; in recliner end of treatment session    Transfers Overall transfer level: Needs assistance Equipment used: Rolling walker (2 wheeled) Transfers: Sit to/from Stand Sit to Stand: Min guard;Min assist         General transfer comment: sit/stand from variety of surfaces (standard toilet with grab bar, recliner)  Ambulation/Gait Ambulation/Gait assistance: Min guard;Min assist Gait Distance (Feet):  (25' x2) Assistive device: 4-wheeled walker       General Gait Details: partially reciprocal stepping pattern, min/mod assist for RW management (veers R).  No overt LOB, but do recommend continued +1 cga/close sup for optimal safety and guidance   Stairs             Wheelchair Mobility    Modified Rankin (Stroke Patients Only)       Balance Overall balance assessment: Needs assistance Sitting-balance support: No upper extremity supported;Feet supported Sitting balance-Leahy Scale: Good     Standing balance support: Bilateral upper extremity supported Standing balance-Leahy Scale: Fair                              Cognition Arousal/Alertness: Awake/alert Behavior During Therapy: Flat affect Overall Cognitive Status: History of cognitive impairments - at baseline                                 General Comments: Tangential but garbled speech. Follows commands 25% of the time with mod/max mulitmodal  cuing. Requires frequent, if not constant, redirection.      Exercises Other Exercises Other Exercises: Toilet transfer, ambulatory with RW, cga/close sup; sit/stand from standard toilet height with grab bar, cga/close sup.  Dep assist for hygiene.  Dep assist for clothing change (patient with incontinent bladder episode with  initial stand from bed); constantly grabbing/manipulating clothes to maintain modesty during hygiene/clothing change. Other Exercises: Representative from [facility] again present for session to observe patient's performance and level of assist required.  Able to visualize patient completing sit/stand, basic transfers and in-room gait with 4WRW, cga/close sup; voices movement is similar to baseline. No additional observations, tasks required.    General Comments        Pertinent Vitals/Pain Pain Assessment: Faces Faces Pain Scale: No hurt    Home Living                      Prior Function            PT Goals (current goals can now be found in the care plan section) Acute Rehab PT Goals Patient Stated Goal: per daughter, to walk PT Goal Formulation: With patient/family Time For Goal Achievement: 07/23/21 Potential to Achieve Goals: Fair Progress towards PT goals: Progressing toward goals    Frequency    Min 2X/week      PT Plan Current plan remains appropriate    Co-evaluation              AM-PAC PT "6 Clicks" Mobility   Outcome Measure  Help needed turning from your back to your side while in a flat bed without using bedrails?: A Little Help needed moving from lying on your back to sitting on the side of a flat bed without using bedrails?: A Little Help needed moving to and from a bed to a chair (including a wheelchair)?: A Little Help needed standing up from a chair using your arms (e.g., wheelchair or bedside chair)?: A Little Help needed to walk in hospital room?: A Little Help needed climbing 3-5 steps with a railing? : A Lot 6 Click Score: 17    End of Session Equipment Utilized During Treatment: Gait belt Activity Tolerance: Patient tolerated treatment well Patient left: in bed;with call bell/phone within reach;with bed alarm set Nurse Communication: Mobility status PT Visit Diagnosis: Muscle weakness (generalized) (M62.81);Difficulty in  walking, not elsewhere classified (R26.2);Unsteadiness on feet (R26.81)     Time: 3244-0102 PT Time Calculation (min) (ACUTE ONLY): 28 min  Charges:  $Gait Training: 8-22 mins $Therapeutic Activity: 8-22 mins                     Luvena Wentling H. Manson Passey, PT, DPT, NCS 07/14/21, 2:06 PM [phone]

## 2021-07-14 NOTE — Plan of Care (Signed)

## 2021-07-14 NOTE — [place] FL2 (Addendum)
[place] MEDICAID FL2 LEVEL OF CARE SCREENING TOOL     IDENTIFICATION  Patient Name: Leah Ritter Birthdate: Oct 27, 1950 Sex: female Admission Date (Current Location): 07/06/2021  [place] and [place] Number:  Chiropodist and Address:  [facility], [address] [place], [place] [address]      Provider Number: [id number]  Attending Physician Name and Address:  Joycelyn Das, MD  Relative Name and Phone Number:  Fredric Mare (Daughter)   [phone] [facility])    Current Level of Care: Hospital Recommended Level of Care: Memory Care (with home PT) Prior Approval Number:    Date Approved/Denied:   PASRR Number:    Discharge Plan: Other (Comment) (Memory Care ALF with home PT)    Current Diagnoses: Patient Active Problem List   Diagnosis Date Noted   Acute metabolic encephalopathy 07/06/2021   Hypothermia 07/06/2021   Type II diabetes mellitus with renal manifestations (HCC) 07/06/2021   Stroke (HCC)    Hypertension    A-fib (HCC)    Chronic diastolic CHF (congestive heart failure) (HCC)    Depression with anxiety    CKD (chronic kidney disease), stage IIIb    Aspiration pneumonia (HCC)    Nausea & vomiting    AKI (acute kidney injury) (HCC)    Hyperglycemia    AMS (altered mental status) 05/05/2021   SIRS (systemic inflammatory response syndrome) (HCC) 01/09/2019   Acute on chronic systolic CHF (congestive heart failure) (HCC) 01/09/2019   Pain in joint involving ankle and foot 01/03/2018   Localized, primary osteoarthritis of shoulder region 01/03/2018   Rupture of tendon of biceps, long head 01/03/2018   Disorder of bursae of shoulder region 01/03/2018   Screening declined by patient 10/25/2017   Full thickness rotator cuff tear 04/21/2016   Chronic bilateral low back pain without sciatica 02/10/2016   H/O rotator cuff tear 02/10/2016   History of vitamin D deficiency 02/10/2016   Type 2 diabetes mellitus without  complication, with long-term current use of insulin (HCC) 02/10/2016   Diabetes (HCC) 11/24/2006   HLD (hyperlipidemia) 11/24/2006   OBESITY, NOS 11/24/2006   POST TRAUMATIC STRESS DISORDER 11/24/2006   DEPRESSIVE DISORDER, NOS 11/24/2006   HYPERTENSION, BENIGN SYSTEMIC 11/24/2006   RHINITIS, ALLERGIC 11/24/2006   GERD (gastroesophageal reflux disease) 11/24/2006   OSTEOARTHRITIS, MULTI SITES 11/24/2006   APNEA, SLEEP 11/24/2006    Orientation RESPIRATION BLADDER Height & Weight     Self  Normal Incontinent Weight: 211 lb 10.3 oz (96 kg) Height:  5\' 10"  (177.8 cm)  BEHAVIORAL SYMPTOMS/MOOD NEUROLOGICAL BOWEL NUTRITION STATUS   (None)  (None) Continent Diet (Pureed)  AMBULATORY STATUS COMMUNICATION OF NEEDS Skin   Limited Assist Verbally Normal                       Personal Care Assistance Level of Assistance  Bathing, Feeding, Dressing Bathing Assistance: Limited assistance Feeding assistance: Limited assistance Dressing Assistance: Limited assistance     Functional Limitations Info  Sight, Speech, Hearing Sight Info: Adequate Hearing Info: Adequate Speech Info: Impaired    SPECIAL CARE FACTORS FREQUENCY  PT (By licensed PT)     PT Frequency: 3 x week            Contractures Contractures Info: Not present    Additional Factors Info  Code Status, Allergies Code Status Info: DNR Allergies Info: Oxycodone-acetaminophen, Ezetimibe, Lisinopril, Statins.           Current Medications (07/14/2021):  This is the  current hospital active medication list Current Facility-Administered Medications  Medication Dose Route Frequency Provider Last Rate Last Admin   acetaminophen (TYLENOL) suppository 650 mg  650 mg Rectal Q6H PRN Lorretta Harp, MD       albuterol (PROVENTIL) (2.5 MG/3ML) 0.083% nebulizer solution 2.5 mg  2.5 mg Nebulization Q4H PRN Lorretta Harp, MD       amLODipine (NORVASC) tablet 5 mg  5 mg Oral Daily Gillis Santa, MD   5 mg at 07/14/21 0818    aspirin EC tablet 81 mg  81 mg Oral Daily Lorretta Harp, MD   81 mg at 07/14/21 0818   atorvastatin (LIPITOR) tablet 80 mg  80 mg Oral q1800 Lorretta Harp, MD   80 mg at 07/13/21 1751   bisacodyl (DULCOLAX) EC tablet 10 mg  10 mg Oral Daily PRN Gillis Santa, MD   10 mg at 07/09/21 1228   bisacodyl (DULCOLAX) suppository 10 mg  10 mg Rectal Daily PRN Gillis Santa, MD       cholecalciferol (VITAMIN D3) tablet 2,000 Units  2,000 Units Oral Leo Grosser, MD   2,000 Units at 07/14/21 5400   dextromethorphan-guaiFENesin (MUCINEX DM) 30-600 MG per 12 hr tablet 1 tablet  1 tablet Oral BID PRN Lorretta Harp, MD       feeding supplement (ENSURE ENLIVE / ENSURE PLUS) liquid 237 mL  237 mL Oral TID BM Sheikh, Omair Latif, DO   237 mL at 07/14/21 1240   guaiFENesin (ROBITUSSIN) 100 MG/5ML solution 400 mg  20 mL Oral Q4H Sheikh, Kateri Mc Rossie, DO   400 mg at 07/14/21 1239   haloperidol lactate (HALDOL) injection 0.5 mg  0.5 mg Intravenous Q6H PRN Andris Baumann, MD   0.5 mg at 07/11/21 2123   hydrALAZINE (APRESOLINE) injection 5 mg  5 mg Intravenous Q2H PRN Lorretta Harp, MD   5 mg at 07/11/21 0517   insulin aspart (novoLOG) injection 0-5 Units  0-5 Units Subcutaneous QHS Lorretta Harp, MD   4 Units at 07/13/21 2149   insulin aspart (novoLOG) injection 0-9 Units  0-9 Units Subcutaneous TID WC Lorretta Harp, MD   3 Units at 07/14/21 1239   insulin aspart (novoLOG) injection 3 Units  3 Units Subcutaneous TID WC Pokhrel, Laxman, MD   3 Units at 07/14/21 1240   iohexol (OMNIPAQUE) 9 MG/ML oral solution 500 mL  500 mL Oral Once PRN Lorretta Harp, MD       metoprolol tartrate (LOPRESSOR) tablet 75 mg  75 mg Oral BID Marzetta Board, MD   75 mg at 07/14/21 0819   mirabegron ER (MYRBETRIQ) tablet 25 mg  25 mg Oral Daily Lorretta Harp, MD   25 mg at 07/14/21 0820   multivitamin with minerals tablet 1 tablet  1 tablet Oral Daily Gillis Santa, MD   1 tablet at 07/14/21 0821   ondansetron (ZOFRAN) injection 4 mg  4 mg Intravenous  Q8H PRN Lorretta Harp, MD       polyethylene glycol (MIRALAX / GLYCOLAX) packet 17 g  17 g Oral Daily Gillis Santa, MD   17 g at 07/14/21 8676   rivaroxaban (XARELTO) tablet 20 mg  20 mg Oral Q supper Lorretta Harp, MD   20 mg at 07/13/21 1751     Discharge Medications: STOP taking these medications     ibuprofen 600 MG tablet Commonly known as: ADVIL           TAKE these medications     ALPRAZolam 0.25 MG tablet  Commonly known as: XANAX Take 0.25 mg by mouth 2 (two) times daily as needed (for agitation).    ALPRAZolam 0.25 MG tablet Commonly known as: XANAX Take 1 tablet (0.25 mg total) by mouth 2 (two) times daily.    amLODipine 5 MG tablet Commonly known as: NORVASC Take 1 tablet (5 mg total) by mouth daily. Start taking on: July 15, 2021    aspirin EC 81 MG tablet Take 81 mg by mouth daily.    atorvastatin 80 MG tablet Commonly known as: LIPITOR Take 80 mg by mouth daily at 6 PM.    bisacodyl 10 MG suppository Commonly known as: DULCOLAX Place 1 suppository (10 mg total) rectally daily as needed for moderate constipation.    Contour Next Test test strip Generic drug: glucose blood 1 each by Other route every evening.    dextromethorphan-guaiFENesin 30-600 MG 12hr tablet Commonly known as: MUCINEX DM Take 1 tablet by mouth 2 (two) times daily as needed for up to 5 days for cough.    feeding supplement Liqd Take 237 mLs by mouth 3 (three) times daily between meals.    furosemide 40 MG tablet Commonly known as: LASIX Take 1 tablet (40 mg total) by mouth daily as needed for fluid or edema.    glipiZIDE 10 MG tablet Commonly known as: GLUCOTROL Take 10 mg by mouth 2 (two) times daily before a meal.    guaiFENesin 100 MG/5ML Soln Commonly known as: ROBITUSSIN Take 20 mLs (400 mg total) by mouth every 4 (four) hours.    metoprolol succinate 50 MG 24 hr tablet Commonly known as: TOPROL-XL Take 150 mg by mouth daily.    mirtazapine 15 MG disintegrating  tablet Commonly known as: REMERON SOL-TAB Take 1 tablet (15 mg total) by mouth at bedtime.    multivitamin with minerals Tabs tablet Take 1 tablet by mouth daily. Start taking on: July 15, 2021    Myrbetriq 25 MG Tb24 tablet Generic drug: mirabegron ER Take 25 mg by mouth daily.    polyethylene glycol 17 g packet Commonly known as: MIRALAX / GLYCOLAX Take 17 g by mouth daily as needed for moderate constipation.    QUEtiapine 50 MG tablet Commonly known as: SEROQUEL Take 50 mg by mouth at bedtime.    spironolactone 25 MG tablet Commonly known as: ALDACTONE Take 25 mg by mouth daily.    Vitamin D (Ergocalciferol) 50 MCG (2000 UT) Caps Take 2,000 Units by mouth every morning.    Xarelto 20 MG Tabs tablet Generic drug: rivaroxaban Take 20 mg by mouth daily.    Relevant Imaging Results:  Relevant Lab Results:   Additional Information SS#: [id number]  Margarito Liner, LCSW

## 2021-07-14 NOTE — Discharge Summary (Addendum)
Physician Discharge Summary  Leah Ritter VQQ:[id number] DOB: 07-Apr-1951 DOA: 07/06/2021  PCP: Mortimer Fries, PA  Admit date: 07/06/2021 Discharge date: 07/14/2021  Admitted From: Memory care unit  Discharge disposition: Memory care unit  Recommendations for Outpatient Follow-Up:   Follow up with your primary care provider in one week.  Check CBC, BMP, magnesium in the next visit Patient is on a dysphagia 1 diet.   Discharge Diagnosis:   Principal Problem:   Acute metabolic encephalopathy Active Problems:   HLD (hyperlipidemia)   Hypothermia   Type II diabetes mellitus with renal manifestations (HCC)   Stroke (HCC)   Hypertension   A-fib (HCC)   Chronic diastolic CHF (congestive heart failure) (HCC)   Depression with anxiety   CKD (chronic kidney disease), stage IIIb   Aspiration pneumonia (HCC)   Nausea & vomiting   Discharge Condition: Improved.  Diet recommendation:   Dysphagia 1 diet   Wound care: None.  Code status: DNR   History of Present Illness:   Patient is a 70 year old obese African-American female with history of hypertension, hyperlipidemia, diabetes mellitus type 2, history of CVA, depression, anxiety and sleep apnea not on CPAP, history of CHF, A. fib on Xarelto, CKD stage IIIb a presented to hospital with altered mental status, nausea vomiting and possible aspiration.  Patient had difficulty with her speech and was unable to recognize her family and had worsening mental status.  In the ED, she was barely arousable and did not follow any commands.  Initial WBC was 11.1 and lactate of 1.7.  Chest x-ray showed cardiomegaly and vascular congestion.  CT scan of abdomen pelvis was negative for acute issues.  CT head was negative for acute process.  Patient was then admitted hospital for acute metabolic encephalopathy likely secondary to aspiration pneumonia and was started on IV Unasyn.  Blood cultures showed gram-positive rods in 1 bottle which was likely  contaminant.  Patient received antibiotic during hospitalization for pneumonia.    Hospital Course:   Following conditions were addressed during hospitalization as listed below,  Acute Metabolic Encephalopathy superimposed on Chronic Dementia Thought to be delirium superimposed on dementia.  CT head negative for acute intracranial abnormalities.  Polypharmacy was on the differential diagnosis.    Psych was consulted but recommended no intervention. Patient continues to [facility] but oriented to self.  She is at her baseline at this time.   Sepsis secondary to aspiration PNA Improved at this time.  Was on Unasyn and has completed antibiotic course.Marland Kitchen  Received flutter valve incentive spirometry during hospitalization.Marland Kitchen  Speech therapy evaluated patient and recommended pured diet on discharge.  Currently stable.   Hyperlipidemia Continue Lipitor.   Hypothermia: Resolved at this time.    Type II diabetes mellitus with chronic kidney disease. Recent hemoglobin A1c 10.5, takes glipizide as outpatient.  Will be resumed on discharge.  History of stroke  CT scan of the head showed chronic CVA.  Continue aspirin, Lipitor.   Essential hypertension Continue metoprolol    Hyponatremia, likely pseudohyponatremia Resolved.  Latest sodium of 139.   Permanent atrial fibrillation. Rate controlled.  Continue metoprolol and Xarelto.     Chronic Diastolic heart failure: 2D echocardiogram on 05/07/2021 showed EF of 75% with grade 1 diastolic dysfunction.  Chest x-ray showed cardiomegaly with pulmonary vascular congestion.  Resumed on Lasix and spironolactone on discharge.   Depression with anxiety Xanax, Remeron, Seroquel at baseline.  Will be resumed on discharge.   Chronic kidney disease, stage IIIb BMP stable at this  time.  Continue to monitor   Nausea & Vomiting Improved.  CT scan of the abdomen and pelvis was negative for acute intra-abdominal process.  Lipase was within normal limits.   Continue IV Zofran  Disposition.  At this time, patient is stable for disposition back to memory care unit.  Spoke with the patient's daughter yesterday.  Medical Consultants:   Psychiatry  Procedures:    None Subjective:   Today, patient was seen and examined at bedside.  At her baseline.  Mumbles.  Denies any pain, nausea vomiting  Discharge Exam:   Vitals:   07/14/21 0421 07/14/21 0732  BP: 124/63 137/63  Pulse: 83 70  Resp: 18 18  Temp: 98.3 F (36.8 C) 98.6 F (37 C)  SpO2: 98% 97%   Vitals:   07/13/21 2147 07/14/21 0421 07/14/21 0500 07/14/21 0732  BP: 134/63 124/63  137/63  Pulse: 84 83  70  Resp:  18  18  Temp:  98.3 F (36.8 C)  98.6 F (37 C)  TempSrc:  Oral  Oral  SpO2:  98%  97%  Weight:   96 kg   Height:        General: Alert awake, not in obvious distress,  HENT: pupils equally reacting to light,  No scleral pallor or icterus noted. Oral mucosa is moist.  Chest:  Clear breath sounds.  Diminished breath sounds bilaterally. No crackles or wheezes.  CVS: S1 &S2 heard. No murmur.  Regular rate and rhythm. Abdomen: Soft, nontender, nondistended.  Bowel sounds are heard.   Extremities: No cyanosis, clubbing or edema.  Peripheral pulses are palpable. Psych: Alert, awake and oriented to self, has baseline dementia, mumbles, CNS:  No cranial nerve deficits.  Moves all extremities Skin: Warm and dry.  No rashes noted.  The results of significant diagnostics from this hospitalization (including imaging, microbiology, ancillary and laboratory) are listed below for reference.     Diagnostic Studies:   CT ABDOMEN PELVIS WO CONTRAST  Result Date: 07/06/2021 CLINICAL DATA:  Nausea, vomiting EXAM: CT ABDOMEN AND PELVIS WITHOUT CONTRAST TECHNIQUE: Multidetector CT imaging of the abdomen and pelvis was performed following the standard protocol without IV contrast. COMPARISON:  None. FINDINGS: Lower chest: Coronary artery and aortic calcifications. Bibasilar  atelectasis. Hepatobiliary: Layering gallstones within the gallbladder. No biliary ductal dilatation or visible ductal stones. No focal hepatic abnormality. Pancreas: No focal abnormality or ductal dilatation. Spleen: No focal abnormality.  Normal size. Adrenals/Urinary Tract: No adrenal abnormality. No focal renal abnormality. No stones or hydronephrosis. Urinary bladder is unremarkable. Stomach/Bowel: Normal appendix. Scattered colonic diverticulosis. No active diverticulitis. Stomach and small bowel decompressed. Vascular/Lymphatic: Scattered aortic atherosclerosis. No evidence of aneurysm or adenopathy. Reproductive: Prior hysterectomy.  No adnexal masses. Other: No free fluid or free air. Musculoskeletal: No acute bony abnormality. IMPRESSION: Cholelithiasis.  No CT evidence of acute cholecystitis. Bibasilar atelectasis. Coronary artery disease, aortic atherosclerosis. Scattered colonic diverticulosis. No acute findings in the abdomen or pelvis. Electronically Signed   By: Charlett Nose M.D.   On: 07/06/2021 12:51   CT HEAD WO CONTRAST ( )  Result Date: 07/06/2021 CLINICAL DATA:  Altered mental status. EXAM: CT HEAD WITHOUT CONTRAST TECHNIQUE: Contiguous axial images were obtained from the base of the skull through the vertex without intravenous contrast. COMPARISON:  05/05/2021 FINDINGS: Brain: There is no evidence of an acute infarct, intracranial hemorrhage, mass, midline shift, or extra-axial fluid collection. A small to moderate-sized chronic infarct is again noted in the posterior right frontal lobe. Hypodensities elsewhere in  the cerebral white matter bilaterally are unchanged and nonspecific but compatible with mild chronic small vessel ischemic disease. Mild cerebral atrophy is unchanged. Vascular: Calcified atherosclerosis at the skull base. No hyperdense vessel. Skull: No acute fracture or suspicious osseous lesion. Sinuses/Orbits: Mucosal thickening and large volume fluid in the left maxillary  sinus. Mild mucosal thickening in the other left-sided paranasal sinuses. Clear mastoid air cells. Unremarkable orbits. Other: None. IMPRESSION: 1. No evidence of acute intracranial abnormality. 2. Chronic ischemia including a chronic right frontal infarct. 3. Left maxillary sinusitis. Electronically Signed   By: Sebastian Ache M.D.   On: 07/06/2021 06:45   DG Chest Portable 1 View  Result Date: 07/06/2021 CLINICAL DATA:  Altered mental status.  Vomiting. EXAM: PORTABLE CHEST 1 VIEW COMPARISON:  05/05/2021 FINDINGS: The cardiac silhouette remains enlarged. Lung volumes remain low with similar appearance of pulmonary vascular congestion and stable to slightly increased interstitial prominence. There may be trace pleural effusions. No pneumothorax is identified. No acute osseous abnormality is seen. IMPRESSION: Cardiomegaly with unchanged to slightly increased pulmonary vascular congestion/mild edema. Electronically Signed   By: Sebastian Ache M.D.   On: 07/06/2021 06:29     Labs:   Basic Metabolic Panel: Recent Labs  Lab 07/08/21 0525 07/09/21 0644 07/10/21 0828 07/11/21 0411 07/11/21 1748 07/12/21 0516 07/13/21 0428  NA 138 139 139 141  --  139 139  K 4.1 4.3 4.3 3.8  --  5.1 4.1  CL 105 106 106 106  --  107 104  CO2 25 26 25 27   --  20* 29  GLUCOSE 136* 139* 153* 158* 397* 139* 193*  BUN 20 20 20 19   --  26* 26*  CREATININE 1.07* 0.88 1.05* 0.98  --  0.99 1.02*  CALCIUM 8.5* 9.1 9.0 9.1  --  8.9 9.1  MG 1.7 2.0 2.0 2.0  --   --   --   PHOS 2.7 3.4 3.6 2.9  --   --   --    GFR Estimated Creatinine Clearance: 63.5 mL/min (A) (by C-G formula based on SCr of 1.02 mg/dL (H)). Liver Function Tests: Recent Labs  Lab 07/08/21 0525  AST 28  ALT 35  ALKPHOS 93  BILITOT 0.7  PROT 6.6  ALBUMIN 2.5*   No results for input(s): LIPASE, AMYLASE in the last 168 hours. No results for input(s): AMMONIA in the last 168 hours. Coagulation profile No results for input(s): INR, PROTIME in the  last 168 hours.  CBC: Recent Labs  Lab 07/08/21 0525 07/09/21 0644 07/10/21 0828 07/11/21 0411 07/12/21 0659 07/13/21 0428  WBC 9.0 8.7 8.1 6.3 6.6 6.3  NEUTROABS 6.2  --   --   --   --   --   HGB 10.0* 10.5* 10.5* 10.5* 10.6* 10.4*  HCT 30.0* 31.7* 31.6* 33.0* 32.4* 33.0*  MCV 88.8 89.3 89.0 88.2 88.8 88.2  PLT 238 233 238 255 297 293   Cardiac Enzymes: No results for input(s): CKTOTAL, CKMB, CKMBINDEX, TROPONINI in the last 168 hours. BNP: Invalid input(s): POCBNP CBG: Recent Labs  Lab 07/13/21 1206 07/13/21 1637 07/13/21 2059 07/14/21 0733 07/14/21 1136  GLUCAP 337* 179* 309* 204* 225*   D-Dimer No results for input(s): DDIMER in the last 72 hours. Hgb A1c No results for input(s): HGBA1C in the last 72 hours. Lipid Profile No results for input(s): CHOL, HDL, LDLCALC, TRIG, CHOLHDL, LDLDIRECT in the last 72 hours. Thyroid function studies No results for input(s): TSH, T4TOTAL, T3FREE, THYROIDAB in the last  72 hours.  Invalid input(s): FREET3 Anemia work up No results for input(s): VITAMINB12, FOLATE, FERRITIN, TIBC, IRON, RETICCTPCT in the last 72 hours. Microbiology Recent Results (from the past 240 hour(s))  Resp Panel by RT-PCR (Flu A&B, Covid) Nasopharyngeal Swab     Status: None   Collection Time: 07/06/21  6:09 AM   Specimen: Nasopharyngeal Swab; Nasopharyngeal(NP) swabs in vial transport medium  Result Value Ref Range Status   SARS Coronavirus 2 by RT PCR NEGATIVE NEGATIVE Final    Comment: (NOTE) SARS-CoV-2 target nucleic acids are NOT DETECTED.  The SARS-CoV-2 RNA is generally detectable in upper respiratory specimens during the acute phase of infection. The lowest concentration of SARS-CoV-2 viral copies this assay can detect is 138 copies/mL. A negative result does not preclude SARS-Cov-2 infection and should not be used as the sole basis for treatment or other patient management decisions. A negative result may occur with  improper specimen  collection/handling, submission of specimen other than nasopharyngeal swab, presence of viral mutation(s) within the areas targeted by this assay, and inadequate number of viral copies(<138 copies/mL). A negative result must be combined with clinical observations, patient history, and epidemiological information. The expected result is Negative.  Fact Sheet for Patients:  BloggerCourse.com  Fact Sheet for Healthcare Providers:  SeriousBroker.it  This test is no t yet approved or cleared by the [place] FDA and  has been authorized for detection and/or diagnosis of SARS-CoV-2 by FDA under an Emergency Use Authorization (EUA). This EUA will remain  in effect (meaning this test can be used) for the duration of the COVID-19 declaration under Section 564(b)(1) of the Act, 21 U.S.C.section 360bbb-3(b)(1), unless the authorization is terminated  or revoked sooner.       Influenza A by PCR NEGATIVE NEGATIVE Final   Influenza B by PCR NEGATIVE NEGATIVE Final    Comment: (NOTE) The Xpert Xpress SARS-CoV-2/FLU/RSV plus assay is intended as an aid in the diagnosis of influenza from Nasopharyngeal swab specimens and should not be used as a sole basis for treatment. Nasal washings and aspirates are unacceptable for Xpert Xpress SARS-CoV-2/FLU/RSV testing.  Fact Sheet for Patients: BloggerCourse.com  Fact Sheet for Healthcare Providers: SeriousBroker.it  This test is not yet approved or cleared by the [place] FDA and has been authorized for detection and/or diagnosis of SARS-CoV-2 by FDA under an Emergency Use Authorization (EUA). This EUA will remain in effect (meaning this test can be used) for the duration of the COVID-19 declaration under Section 564(b)(1) of the Act, 21 U.S.C. section 360bbb-3(b)(1), unless the authorization is terminated or revoked.  Performed at [facility], [address] [place], [place] [address]   Blood culture (routine x 2)     Status: Abnormal   Collection Time: 07/06/21  6:10 AM   Specimen: BLOOD  Result Value Ref Range Status   Specimen Description   Final    BLOOD LEFT [facility] Performed at [facility], [address], [place], [place] [address]    Special Requests   Final    BOTTLES DRAWN AEROBIC AND ANAEROBIC Blood Culture results may not be optimal due to an excessive volume of blood received in culture bottles Performed at [facility], [address], [place], [place] [address]    Culture  Setup Time   Final    GRAM POSITIVE RODS AEROBIC BOTTLE ONLY CRITICAL RESULT CALLED TO, READ BACK BY AND VERIFIED WITH: JASON ROBBINS@[id number]  07/07/21 RH GRAM POSITIVE COCCI AEROBIC BOTTLE ONLY CRITICAL RESULT  CALLED TO, READ BACK BY AND VERIFIED WITH: PHARM D A.CHAPPELL ON [id number] AT 0951 BY E.PARRISH    Culture (A)  Final    DIPHTHEROIDS(CORYNEBACTERIUM SPECIES) AEROCOCCUS URINAE Standardized susceptibility testing for this organism is not available. Performed at [facility], [address] [place], [place] [address]    Report Status 07/11/2021 FINAL  Final  Blood culture (routine x 2)     Status: None   Collection Time: 07/06/21  6:10 AM   Specimen: BLOOD  Result Value Ref Range Status   Specimen Description BLOOD LEFT HAND  Final   Special Requests   Final    BOTTLES DRAWN AEROBIC AND ANAEROBIC Blood Culture adequate volume   Culture   Final    NO GROWTH 5 DAYS Performed at [facility], [address] [place], [place] [address]    Report Status 07/11/2021 FINAL  Final  CULTURE, BLOOD (ROUTINE X 2) w Reflex to ID Panel     Status: None   Collection Time: 07/07/21 11:51 AM   Specimen: BLOOD  Result Value Ref Range Status   Specimen Description BLOOD BLOOD RIGHT FOREARM  Final   Special Requests   Final    BOTTLES DRAWN AEROBIC ONLY Blood Culture results may not be optimal  due to an inadequate volume of blood received in culture bottles   Culture   Final    NO GROWTH 5 DAYS Performed at [facility], [address] [place], [place] [address]    Report Status 07/12/2021 FINAL  Final  CULTURE, BLOOD (ROUTINE X 2) w Reflex to ID Panel     Status: None   Collection Time: 07/07/21 12:06 PM   Specimen: BLOOD  Result Value Ref Range Status   Specimen Description BLOOD BLOOD RIGHT HAND  Final   Special Requests   Final    BOTTLES DRAWN AEROBIC AND ANAEROBIC Blood Culture results may not be optimal due to an inadequate volume of blood received in culture bottles   Culture   Final    NO GROWTH 5 DAYS Performed at [facility], [address] [place], [place] [address]    Report Status 07/12/2021 FINAL  Final     Discharge Instructions:   Discharge Instructions     Call MD for:  persistant nausea and vomiting   Complete by: As directed    Call MD for:  severe uncontrolled pain   Complete by: As directed    Call MD for:  temperature >100.4   Complete by: As directed    Diet general   Complete by: As directed    Dysphagia 1 diet.  Extra Gravies on meats, potatoes. Yogurt, puddings. May have Oatmeal at meals per Speech. Butters.   Discharge instructions   Complete by: As directed    Follow-up with your primary care physician at the skilled nursing facility in 3 to 5 days.  Check blood work at that time.   Increase activity slowly   Complete by: As directed       Allergies as of 07/14/2021       Reactions   Oxycodone-acetaminophen Nausea And Vomiting   Other reaction(s): Vomiting   Ezetimibe Other (See Comments)   Reaction unknown   Lisinopril Other (See Comments)   Reaction unknown   Statins Other (See Comments)   Reaction unknown         Medication List     STOP taking these medications    ibuprofen 600 MG tablet Commonly known  as: ADVIL       TAKE these medications    ALPRAZolam 0.25 MG tablet Commonly  known as: XANAX Take 0.25 mg by mouth 2 (two) times daily as needed (for agitation).   ALPRAZolam 0.25 MG tablet Commonly known as: XANAX Take 1 tablet (0.25 mg total) by mouth 2 (two) times daily.   amLODipine 5 MG tablet Commonly known as: NORVASC Take 1 tablet (5 mg total) by mouth daily. Start taking on: July 15, 2021   aspirin EC 81 MG tablet Take 81 mg by mouth daily.   atorvastatin 80 MG tablet Commonly known as: LIPITOR Take 80 mg by mouth daily at 6 PM.   bisacodyl 10 MG suppository Commonly known as: DULCOLAX Place 1 suppository (10 mg total) rectally daily as needed for moderate constipation.   Contour Next Test test strip Generic drug: glucose blood 1 each by Other route every evening.   dextromethorphan-guaiFENesin 30-600 MG 12hr tablet Commonly known as: MUCINEX DM Take 1 tablet by mouth 2 (two) times daily as needed for up to 5 days for cough.   feeding supplement Liqd Take 237 mLs by mouth 3 (three) times daily between meals.   furosemide 40 MG tablet Commonly known as: LASIX Take 1 tablet (40 mg total) by mouth daily as needed for fluid or edema.   glipiZIDE 10 MG tablet Commonly known as: GLUCOTROL Take 10 mg by mouth 2 (two) times daily before a meal.   guaiFENesin 100 MG/5ML Soln Commonly known as: ROBITUSSIN Take 20 mLs (400 mg total) by mouth every 4 (four) hours.   metoprolol succinate 50 MG 24 hr tablet Commonly known as: TOPROL-XL Take 150 mg by mouth daily.   mirtazapine 15 MG disintegrating tablet Commonly known as: REMERON SOL-TAB Take 1 tablet (15 mg total) by mouth at bedtime.   multivitamin with minerals Tabs tablet Take 1 tablet by mouth daily. Start taking on: July 15, 2021   Myrbetriq 25 MG Tb24 tablet Generic drug: mirabegron ER Take 25 mg by mouth daily.   polyethylene glycol 17 g packet Commonly known as: MIRALAX / GLYCOLAX Take 17 g by mouth daily as needed for moderate constipation.   QUEtiapine 50 MG  tablet Commonly known as: SEROQUEL Take 50 mg by mouth at bedtime.   spironolactone 25 MG tablet Commonly known as: ALDACTONE Take 25 mg by mouth daily.   Vitamin D (Ergocalciferol) 50 MCG (2000 UT) Caps Take 2,000 Units by mouth every morning.   Xarelto 20 MG Tabs tablet Generic drug: rivaroxaban Take 20 mg by mouth daily.          Time coordinating discharge: 39 minutes  Signed:  Garrett Bowring  [facility] 07/14/2021, 1:37 PM

## 2021-07-31 ENCOUNTER — Other Ambulatory Visit: Payer: Self-pay | Admitting: Internal Medicine

## 2021-07-31 DIAGNOSIS — R1310 Dysphagia, unspecified: Secondary | ICD-10-CM

## 2021-07-31 DIAGNOSIS — I639 Cerebral infarction, unspecified: Secondary | ICD-10-CM

## 2021-08-12 ENCOUNTER — Ambulatory Visit
Admission: RE | Admit: 2021-08-12 | Discharge: 2021-08-12 | Disposition: A | Discharge status: Home / Self Care | Payer: Medicare Other | Source: Ambulatory Visit | Attending: Internal Medicine | Admitting: Internal Medicine

## 2021-08-12 DIAGNOSIS — I639 Cerebral infarction, unspecified: Secondary | ICD-10-CM | POA: Diagnosis present

## 2021-08-12 DIAGNOSIS — R1310 Dysphagia, unspecified: Secondary | ICD-10-CM | POA: Diagnosis not present

## 2021-08-12 NOTE — Therapy (Signed)
[facility] DIAGNOSTIC RADIOLOGY [address] [place], [place], [address] Phone: [phone]   Fax:     Modified Barium Swallow  Patient Details  Name: Leah Ritter MRN: [id number] Date of Birth: May 25, 1951 No data recorded  Encounter Date: 08/12/2021   End of Session - 08/12/21 1743     Visit Number 1    Number of Visits 1    Date for SLP Re-Evaluation 08/12/21    SLP Start Time 1255    SLP Stop Time  1405    SLP Time Calculation (min) 70 min            Objective Swallowing Evaluation: Type of Study: MBS-Modified Barium Swallow Study   Patient Details  Name: Leah Ritter MRN: [id number] Date of Birth: [birth year]  Today's Date: 08/12/2021 Time: SLP Start Time (ACUTE ONLY): 1105 -SLP Stop Time (ACUTE ONLY): 1205  SLP Time Calculation (min) (ACUTE ONLY): 60 min   Past Medical History:  Past Medical History:  Diagnosis Date   A-fib (HCC)    Anxiety    CHF (congestive heart failure) (HCC)    Diabetes (HCC)    GERD (gastroesophageal reflux disease)    Hypertension    Sleep apnea    Stroke [facility])    Past Surgical History:  Past Surgical History:  Procedure Laterality Date   PARTIAL HYSTERECTOMY     tubligation     HPI: Patient is a 70 y.o. female with past medical history of advanced dementia, stroke, DM II, HLD, CKD, with recent admission 10/10- 07/14/21 for acute metabolic encephalopathy s/p nausea/vomiting with possible aspiration. SLP evaluated during admission with recommendation for puree/ thin liquids with recommendation for advancement of solids at facility once encephalopathy improved. Prior history of post-stroke dysphagia in 2019. Daughter reports two recent episodes of aspiration PNA.   Subjective: falling asleep unless engaged in conversation   Assessment / Plan / Recommendation  Clinical Impressions 08/12/2021  Clinical Impression Patient presents with moderate oropharyngeal dysphagia with cognitive,  sensory and motor deficits. Oral stage is characterized by disorganized lingual transport and reduced bolus containment, with premature spillage of liquids to the pyriform sinuses before the swallow. There is left sided facial, lingual weakness. Lingual pumping as pt prepares to initiate swallow, and piecemeal deglutition is noted. Penetration/aspiration occurs during the swallow intermittently with thin and nectar thick liquids due to delayed onset of swallow initiation/laryngeal closure. There is also laryngeal penetration of puree. Pt did not sense aspiration, but was able to eject penetration/aspiration with a cued cough. Protrusion of tissue from posterior wall at level of C6-7 consistent with prominent cricopharyngeus; there is intermittent minimal/mild retention and backflow through the UES, greater with solids/ liquids. Suspect chronic, low grade aspiration, and pt at risk for aspiration with any consistency due to impaired sensation and cognitive impairment. Discussed aspiration risks and options for treatment in the context of progression of neurologic disease, including texture modifications, non-oral means of feeding, and impacts on quality of life, as well as use of strategies with an oral diet which may minimize/mitigate but not eliminate aspiration risk. Recommend palliative care consult or discussion with MD to clarify goals of care in context of chronic dysphagia which may worsen with disease progression. As pt would need supervision/cuing at mealtimes for use of strategies, consider use of limited-flow cup such as Provale or Rije, which can limit delivery to 5cc. Pt did not penetrate/aspirate liquids by teaspoon due to ability to retain this amount of material in the valleculae, with  full airway protection during the swallow. If pt/family wish to continue oral diet, recommend dysphagia 3, thin liquids (via limited flow cup), meds crushed in puree, or thin liquids by regular cup (no straw) with  immediate cough/throat clear after each swallow.  SLP Visit Diagnosis Dysphagia, oropharyngeal phase (R13.12)        Impact on safety and function Moderate aspiration risk      Treatment Recommendations 08/12/2021  Treatment Recommendations Defer treatment plan to f/u with SLP     Prognosis 08/12/2021  Prognosis for Safe Diet Advancement Fair  Barriers to Reach Goals Cognitive deficits  Barriers/Prognosis Comment --    Diet Recommendations 08/12/2021  SLP Diet Recommendations Dysphagia 3 (Mech soft) solids;Thin liquid  Liquid Administration via Other (Comment)  Medication Administration Crushed with puree  Compensations Slow rate;Small sips/bites;Minimize environmental distractions;Follow solids with liquid;Hard cough after swallow  Postural Changes Seated upright at 90 degrees;Remain semi-upright after after feeds/meals (Comment)      Other Recommendations 08/12/2021  Recommended Consults Other (Comment)  Oral Care Recommendations Oral care BID;Oral care before and after PO  Other Recommendations --  Follow Up Recommendations Home health SLP  Assistance recommended at discharge --  Functional Status Assessment --    Frequency and Duration  07/07/2021  Speech Therapy Frequency (ACUTE ONLY) min 2x/week  Treatment Duration 2 weeks;1 week      Oral Phase 08/12/2021  Oral Phase Impaired              Oral - Nectar Teaspoon Weak lingual manipulation;Lingual pumping;Reduced posterior propulsion;Piecemeal swallowing;Lingual/palatal residue;Premature spillage;Decreased bolus cohesion  Oral - Nectar Cup Weak lingual manipulation;Lingual pumping;Reduced posterior propulsion;Piecemeal swallowing;Lingual/palatal residue;Premature spillage;Decreased bolus cohesion  Oral - Nectar Straw --  Oral - Thin Teaspoon Weak lingual manipulation;Lingual pumping;Reduced posterior propulsion;Piecemeal swallowing;Lingual/palatal residue;Premature spillage;Decreased bolus cohesion  Oral - Thin Cup  Weak lingual manipulation;Lingual pumping;Reduced posterior propulsion;Piecemeal swallowing;Lingual/palatal residue;Premature spillage;Decreased bolus cohesion  Oral - Thin Straw --  Oral - Puree Weak lingual manipulation;Lingual pumping;Reduced posterior propulsion;Piecemeal swallowing;Lingual/palatal residue;Premature spillage;Decreased bolus cohesion  Oral - Mech Soft Weak lingual manipulation;Lingual pumping;Reduced posterior propulsion;Piecemeal swallowing;Lingual/palatal residue;Premature spillage;Decreased bolus cohesion  Oral - Regular --  Oral - Multi-Consistency --  Oral - Pill --  Oral Phase - Comment --    Pharyngeal Phase 08/12/2021  Pharyngeal Phase Impaired  Pharyngeal- Nectar Teaspoon Delayed swallow initiation-vallecula  Pharyngeal Material does not enter airway  Pharyngeal- Nectar Cup Delayed swallow initiation-pyriform sinuses;Reduced airway/laryngeal closure;Penetration/Aspiration during swallow;Trace aspiration  Pharyngeal Material does not enter airway;Material enters airway, passes BELOW cords without attempt by patient to eject out (silent aspiration)  Pharyngeal- Thin Teaspoon Delayed swallow initiation-vallecula  Pharyngeal Material does not enter airway  Pharyngeal- Thin Cup Delayed swallow initiation-pyriform sinuses;Reduced airway/laryngeal closure;Penetration/Aspiration during swallow;Trace aspiration  Pharyngeal Material enters airway, passes BELOW cords without attempt by patient to eject out (silent aspiration)  Pharyngeal- Thin Straw Delayed swallow initiation-pyriform sinuses;Reduced airway/laryngeal closure;Penetration/Aspiration during swallow;Trace aspiration;Other (Comment)  Pharyngeal Material enters airway, passes BELOW cords without attempt by patient to eject out (silent aspiration)  Pharyngeal- Puree Delayed swallow initiation-vallecula;Delayed swallow initiation-pyriform sinuses;Penetration/Aspiration during swallow  Pharyngeal Material enters  airway, remains ABOVE vocal cords and not ejected out  Pharyngeal- Mechanical Soft Delayed swallow initiation-vallecula  Pharyngeal Material does not enter airway     Cervical Esophageal Phase  08/12/2021  Cervical Esophageal Phase Impaired  Nectar Teaspoon Prominent cricopharyngeal segment  Nectar Cup Prominent cricopharyngeal segment  Nectar Straw --  Thin Teaspoon Prominent cricopharyngeal segment  Thin Cup Prominent cricopharyngeal segment  Thin Straw --  Puree Prominent cricopharyngeal segment;Esophageal backflow into cervical esophagus;Esophageal backflow into the pharynx  Mechanical Soft Prominent cricopharyngeal segment;Esophageal backflow into cervical esophagus;Esophageal backflow into the pharynx    Deneise Lever, MS, CCC-SLP Speech-Language Pathologist  Aliene Altes 08/12/2021, 5:45 PM                                      There were no vitals filed for this visit.                          Dysphagia, unspecified type - Plan: DG SWALLOW FUNC OP MEDICARE SPEECH PATH, DG SWALLOW FUNC OP MEDICARE SPEECH PATH  Cerebral infarction, unspecified mechanism ([place]) - Plan: DG SWALLOW FUNC OP MEDICARE SPEECH PATH, DG SWALLOW FUNC OP MEDICARE SPEECH PATH        Problem List Patient Active Problem List   Diagnosis Date Noted   Acute metabolic encephalopathy 99991111   Hypothermia 07/06/2021   Type II diabetes mellitus with renal manifestations ([place]) 07/06/2021   Stroke ([place])    Hypertension    A-fib (HCC)    Chronic diastolic CHF (congestive heart failure) ([place])    Depression with anxiety    CKD (chronic kidney disease), stage IIIb    Aspiration pneumonia (HCC)    Nausea & vomiting    AKI (acute kidney injury) ([place])    Hyperglycemia    AMS (altered mental status) 05/05/2021   SIRS (systemic inflammatory response syndrome) ([place]) 01/09/2019   Acute on chronic systolic CHF (congestive heart failure) ([place]) 01/09/2019   Pain  in joint involving ankle and foot 01/03/2018   Localized, primary osteoarthritis of shoulder region 01/03/2018   Rupture of tendon of biceps, long head 01/03/2018   Disorder of bursae of shoulder region 01/03/2018   Screening declined by patient 10/25/2017   Full thickness rotator cuff tear 04/21/2016   Chronic bilateral low back pain without sciatica 02/10/2016   H/O rotator cuff tear 02/10/2016   History of vitamin D deficiency 02/10/2016   Type 2 diabetes mellitus without complication, with long-term current use of insulin ([place]) 02/10/2016   Diabetes ([place]) 11/24/2006   HLD (hyperlipidemia) 11/24/2006   OBESITY, NOS 11/24/2006   POST TRAUMATIC STRESS DISORDER 11/24/2006   DEPRESSIVE DISORDER, NOS 11/24/2006   HYPERTENSION, BENIGN SYSTEMIC 11/24/2006   RHINITIS, ALLERGIC 11/24/2006   GERD (gastroesophageal reflux disease) 11/24/2006   OSTEOARTHRITIS, MULTI SITES 11/24/2006   APNEA, SLEEP 11/24/2006    Aliene Altes 08/12/2021, 5:44 PM  [facility] DIAGNOSTIC RADIOLOGY [place], [place], [address] Phone: [phone]   Fax:     Name: Leah Ritter MRN: [id number] Date of Birth: 1951/04/21

## 2021-09-19 IMAGING — DX DG CHEST 1V PORT
1 series · 1 of 1 positions shown · non-contrast
Comparison: 01/09/2019

CLINICAL DATA: altered, eval infiltrate

EXAM:
PORTABLE CHEST 1 VIEW

[chest ap]
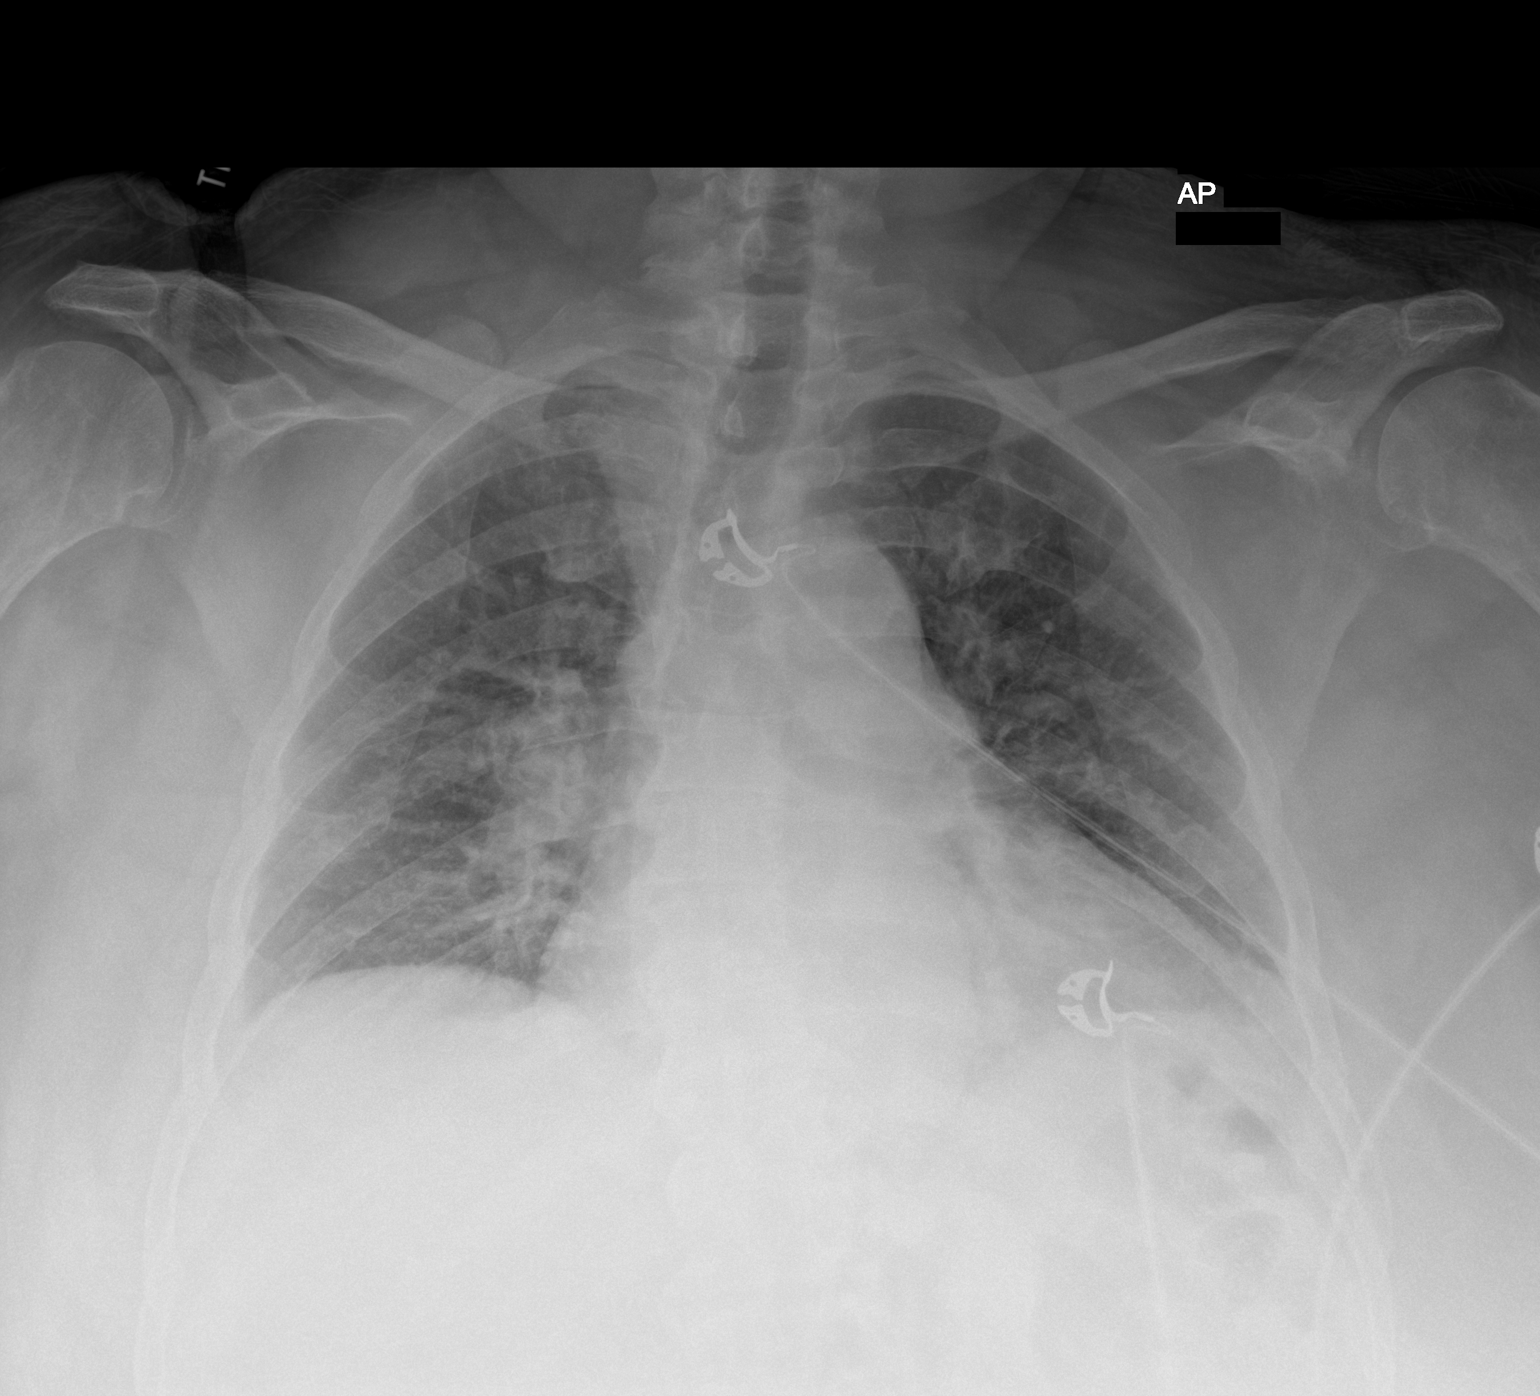

[1 of 1 positions shown; findings below may reference images not displayed]

FINDINGS: Cardiomegaly. Mild vascular congestion. No overt edema. No confluent
opacities or effusions.
IMPRESSION: Cardiomegaly, vascular congestion.

## 2021-12-10 ENCOUNTER — Emergency Department: Payer: Medicare Other

## 2021-12-10 ENCOUNTER — Other Ambulatory Visit: Payer: Self-pay

## 2021-12-10 ENCOUNTER — Encounter: Payer: Self-pay | Admitting: Emergency Medicine

## 2021-12-10 ENCOUNTER — Inpatient Hospital Stay
Admission: EM | Admit: 2021-12-10 | Discharge: 2021-12-15 | DRG: 177 | Disposition: A | Discharge status: Home Health | Payer: Medicare Other | Source: Skilled Nursing Facility | Attending: Internal Medicine | Admitting: Internal Medicine

## 2021-12-10 DIAGNOSIS — Z794 Long term (current) use of insulin: Secondary | ICD-10-CM

## 2021-12-10 DIAGNOSIS — Z90711 Acquired absence of uterus with remaining cervical stump: Secondary | ICD-10-CM

## 2021-12-10 DIAGNOSIS — Z7984 Long term (current) use of oral hypoglycemic drugs: Secondary | ICD-10-CM

## 2021-12-10 DIAGNOSIS — K219 Gastro-esophageal reflux disease without esophagitis: Secondary | ICD-10-CM | POA: Diagnosis present

## 2021-12-10 DIAGNOSIS — Z803 Family history of malignant neoplasm of breast: Secondary | ICD-10-CM

## 2021-12-10 DIAGNOSIS — I5032 Chronic diastolic (congestive) heart failure: Secondary | ICD-10-CM | POA: Diagnosis present

## 2021-12-10 DIAGNOSIS — N179 Acute kidney failure, unspecified: Secondary | ICD-10-CM | POA: Diagnosis not present

## 2021-12-10 DIAGNOSIS — E669 Obesity, unspecified: Secondary | ICD-10-CM | POA: Diagnosis present

## 2021-12-10 DIAGNOSIS — E119 Type 2 diabetes mellitus without complications: Secondary | ICD-10-CM

## 2021-12-10 DIAGNOSIS — J690 Pneumonitis due to inhalation of food and vomit: Principal | ICD-10-CM

## 2021-12-10 DIAGNOSIS — I10 Essential (primary) hypertension: Secondary | ICD-10-CM | POA: Diagnosis present

## 2021-12-10 DIAGNOSIS — Z6830 Body mass index (BMI) 30.0-30.9, adult: Secondary | ICD-10-CM

## 2021-12-10 DIAGNOSIS — N183 Chronic kidney disease, stage 3 unspecified: Secondary | ICD-10-CM

## 2021-12-10 DIAGNOSIS — Z79899 Other long term (current) drug therapy: Secondary | ICD-10-CM

## 2021-12-10 DIAGNOSIS — N8110 Cystocele, unspecified: Secondary | ICD-10-CM | POA: Diagnosis present

## 2021-12-10 DIAGNOSIS — F0150 Vascular dementia without behavioral disturbance: Secondary | ICD-10-CM | POA: Diagnosis present

## 2021-12-10 DIAGNOSIS — K623 Rectal prolapse: Secondary | ICD-10-CM | POA: Diagnosis present

## 2021-12-10 DIAGNOSIS — Z8673 Personal history of transient ischemic attack (TIA), and cerebral infarction without residual deficits: Secondary | ICD-10-CM

## 2021-12-10 DIAGNOSIS — R7989 Other specified abnormal findings of blood chemistry: Secondary | ICD-10-CM

## 2021-12-10 DIAGNOSIS — R4182 Altered mental status, unspecified: Secondary | ICD-10-CM

## 2021-12-10 DIAGNOSIS — N814 Uterovaginal prolapse, unspecified: Secondary | ICD-10-CM | POA: Diagnosis present

## 2021-12-10 DIAGNOSIS — E875 Hyperkalemia: Principal | ICD-10-CM

## 2021-12-10 DIAGNOSIS — Z20822 Contact with and (suspected) exposure to covid-19: Secondary | ICD-10-CM | POA: Diagnosis present

## 2021-12-10 DIAGNOSIS — E860 Dehydration: Secondary | ICD-10-CM | POA: Diagnosis present

## 2021-12-10 DIAGNOSIS — E1122 Type 2 diabetes mellitus with diabetic chronic kidney disease: Secondary | ICD-10-CM | POA: Diagnosis present

## 2021-12-10 DIAGNOSIS — I4891 Unspecified atrial fibrillation: Secondary | ICD-10-CM | POA: Diagnosis present

## 2021-12-10 DIAGNOSIS — Z7901 Long term (current) use of anticoagulants: Secondary | ICD-10-CM

## 2021-12-10 DIAGNOSIS — Z8249 Family history of ischemic heart disease and other diseases of the circulatory system: Secondary | ICD-10-CM

## 2021-12-10 DIAGNOSIS — I130 Hypertensive heart and chronic kidney disease with heart failure and stage 1 through stage 4 chronic kidney disease, or unspecified chronic kidney disease: Secondary | ICD-10-CM | POA: Diagnosis present

## 2021-12-10 DIAGNOSIS — D649 Anemia, unspecified: Secondary | ICD-10-CM | POA: Diagnosis present

## 2021-12-10 DIAGNOSIS — G4730 Sleep apnea, unspecified: Secondary | ICD-10-CM | POA: Diagnosis present

## 2021-12-10 DIAGNOSIS — N390 Urinary tract infection, site not specified: Secondary | ICD-10-CM | POA: Diagnosis present

## 2021-12-10 DIAGNOSIS — Z7982 Long term (current) use of aspirin: Secondary | ICD-10-CM

## 2021-12-10 DIAGNOSIS — Z823 Family history of stroke: Secondary | ICD-10-CM

## 2021-12-10 DIAGNOSIS — D6489 Other specified anemias: Secondary | ICD-10-CM | POA: Diagnosis present

## 2021-12-10 DIAGNOSIS — R7881 Bacteremia: Secondary | ICD-10-CM | POA: Diagnosis present

## 2021-12-10 DIAGNOSIS — E11649 Type 2 diabetes mellitus with hypoglycemia without coma: Secondary | ICD-10-CM | POA: Diagnosis not present

## 2021-12-10 DIAGNOSIS — R680 Hypothermia, not associated with low environmental temperature: Secondary | ICD-10-CM | POA: Diagnosis present

## 2021-12-10 DIAGNOSIS — N1832 Chronic kidney disease, stage 3b: Secondary | ICD-10-CM | POA: Diagnosis present

## 2021-12-10 DIAGNOSIS — E861 Hypovolemia: Secondary | ICD-10-CM | POA: Diagnosis present

## 2021-12-10 DIAGNOSIS — G9341 Metabolic encephalopathy: Secondary | ICD-10-CM | POA: Diagnosis present

## 2021-12-10 LAB — COMPREHENSIVE METABOLIC PANEL
ALT: 68 U/L — ABNORMAL HIGH (ref 0–44)
AST: 55 U/L — ABNORMAL HIGH (ref 15–41)
Albumin: 3.6 g/dL (ref 3.5–5.0)
Alkaline Phosphatase: 135 U/L — ABNORMAL HIGH (ref 38–126)
Anion gap: 9 (ref 5–15)
BUN: 53 mg/dL — ABNORMAL HIGH (ref 8–23)
CO2: 24 mmol/L (ref 22–32)
Calcium: 10 mg/dL (ref 8.9–10.3)
Chloride: 102 mmol/L (ref 98–111)
Creatinine, Ser: 1.76 mg/dL — ABNORMAL HIGH (ref 0.44–1.00)
GFR, Estimated: 31 mL/min — ABNORMAL LOW (ref >60)
Glucose, Bld: 120 mg/dL — ABNORMAL HIGH (ref 70–99)
Potassium: 7.1 mmol/L (ref 3.5–5.1)
Sodium: 135 mmol/L (ref 135–145)
Total Bilirubin: 0.4 mg/dL (ref 0.3–1.2)
Total Protein: 8.4 g/dL — ABNORMAL HIGH (ref 6.5–8.1)

## 2021-12-10 LAB — BASIC METABOLIC PANEL
Anion gap: 8 (ref 5–15)
BUN: 55 mg/dL — ABNORMAL HIGH (ref 8–23)
CO2: 23 mmol/L (ref 22–32)
Calcium: 9.8 mg/dL (ref 8.9–10.3)
Chloride: 105 mmol/L (ref 98–111)
Creatinine, Ser: 1.69 mg/dL — ABNORMAL HIGH (ref 0.44–1.00)
GFR, Estimated: 32 mL/min — ABNORMAL LOW (ref >60)
Glucose, Bld: 125 mg/dL — ABNORMAL HIGH (ref 70–99)
Potassium: 6.4 mmol/L (ref 3.5–5.1)
Sodium: 136 mmol/L (ref 135–145)

## 2021-12-10 LAB — URINALYSIS, COMPLETE (UACMP) WITH MICROSCOPIC
Bacteria, UA: NONE SEEN
Bilirubin Urine: NEGATIVE
Glucose, UA: NEGATIVE mg/dL
Hgb urine dipstick: NEGATIVE
Ketones, ur: NEGATIVE mg/dL
Nitrite: NEGATIVE
Protein, ur: NEGATIVE mg/dL
Specific Gravity, Urine: 1.017 (ref 1.005–1.030)
pH: 7 (ref 5.0–8.0)

## 2021-12-10 LAB — RESP PANEL BY RT-PCR (FLU A&B, COVID) ARPGX2
Influenza A by PCR: NEGATIVE
Influenza B by PCR: NEGATIVE
SARS Coronavirus 2 by RT PCR: NEGATIVE

## 2021-12-10 LAB — APTT: aPTT: 37 seconds — ABNORMAL HIGH (ref 24–36)

## 2021-12-10 LAB — BRAIN NATRIURETIC PEPTIDE: B Natriuretic Peptide: 6.8 pg/mL (ref 0.0–100.0)

## 2021-12-10 LAB — PROTIME-INR
INR: 1.4 — ABNORMAL HIGH (ref 0.8–1.2)
Prothrombin Time: 17 seconds — ABNORMAL HIGH (ref 11.4–15.2)

## 2021-12-10 LAB — CBC
HCT: 37.1 % (ref 36.0–46.0)
Hemoglobin: 11.5 g/dL — ABNORMAL LOW (ref 12.0–15.0)
MCH: 27.6 pg (ref 26.0–34.0)
MCHC: 31 g/dL (ref 30.0–36.0)
MCV: 89 fL (ref 80.0–100.0)
Platelets: 126 10*3/uL — ABNORMAL LOW (ref 150–400)
RBC: 4.17 MIL/uL (ref 3.87–5.11)
RDW: 17 % — ABNORMAL HIGH (ref 11.5–15.5)
WBC: 9.6 10*3/uL (ref 4.0–10.5)
nRBC: 0.3 % — ABNORMAL HIGH (ref 0.0–0.2)

## 2021-12-10 LAB — TROPONIN I (HIGH SENSITIVITY): Troponin I (High Sensitivity): 17 ng/L (ref <18)

## 2021-12-10 LAB — LACTIC ACID, PLASMA: Lactic Acid, Venous: 1.6 mmol/L (ref 0.5–1.9)

## 2021-12-10 LAB — PROCALCITONIN: Procalcitonin: <0.1 ng/mL

## 2021-12-10 MED ORDER — INSULIN ASPART 100 UNIT/ML IV SOLN
10.0000 [IU] | Freq: Once | INTRAVENOUS | Status: AC
  Administered 2021-12-10: 10 [IU] via INTRAVENOUS
  Filled 2021-12-10 (×2): qty 0.1

## 2021-12-10 MED ORDER — PANTOPRAZOLE SODIUM 40 MG IV SOLR
40.0000 mg | INTRAVENOUS | Status: DC
Start: 2021-12-10 — End: 2021-12-14
  Administered 2021-12-11 – 2021-12-13 (×4): 40 mg via INTRAVENOUS
  Filled 2021-12-10 (×4): qty 10

## 2021-12-10 MED ORDER — SODIUM BICARBONATE 8.4 % IV SOLN
50.0000 meq | Freq: Once | INTRAVENOUS | Status: AC
  Administered 2021-12-10: 50 meq via INTRAVENOUS
  Filled 2021-12-10: qty 50

## 2021-12-10 MED ORDER — CEFTRIAXONE SODIUM 2 G IJ SOLR
2.0000 g | Freq: Once | INTRAMUSCULAR | Status: DC
  Filled 2021-12-10: qty 20

## 2021-12-10 MED ORDER — DEXTROSE 50 % IV SOLN
1.0000 | Freq: Once | INTRAVENOUS | Status: AC
  Administered 2021-12-10: 50 mL via INTRAVENOUS
  Filled 2021-12-10: qty 50

## 2021-12-10 MED ORDER — SODIUM POLYSTYRENE SULFONATE 15 GM/60ML PO SUSP: 30.0000 g | Freq: Once | ORAL | Status: DC

## 2021-12-10 MED ORDER — CALCIUM GLUCONATE-NACL 1-0.675 GM/50ML-% IV SOLN
1.0000 g | Freq: Once | INTRAVENOUS | Status: AC
Start: 2021-12-10 — End: 2021-12-10
  Administered 2021-12-10: 1000 mg via INTRAVENOUS
  Filled 2021-12-10: qty 50

## 2021-12-10 NOTE — Assessment & Plan Note (Addendum)
At the time of admission she was noted to be severely hyperkalemic with potassium level of 7.  Etiology could be due to medications.  She was noted to be on Aldactone.  Dehydration and acute kidney injury also contributing.  She was given dextrose insulin and calcium.  Also given Kayexalate and Lokelma. ?Potassium has improved.  Stop Aldactone.  Recheck labs in 1 week. ?

## 2021-12-10 NOTE — Assessment & Plan Note (Addendum)
Hemoglobin stable for the most part.  No evidence for overt bleeding. ?

## 2021-12-10 NOTE — ED Notes (Addendum)
Pt transported for imaging. 

## 2021-12-10 NOTE — Assessment & Plan Note (Addendum)
Creatinine was 1.02 in October 2022.  Presented with creatinine of 1.76.  Likely dehydrated from nausea and vomiting and poor oral intake.   ?Renal function improved with IV fluids.  We will stop her Aldactone due to hyperkalemia.  Okay to stop IV fluids. ?

## 2021-12-10 NOTE — Assessment & Plan Note (Signed)
CPAP per home settings.   

## 2021-12-10 NOTE — ED Triage Notes (Signed)
Pt comes into the ED via [facility] from [facility] c/o AMS.  Pt went to lunch today at her baseline and then once she was checked on after lunch a little later, she was acting not at her baseline.  Pt does have dementia, but now not responding to questions.  Pt in NAD with even and unlabored respirations.  Strong urine odor present from patient.  Pt has h/o aspirations risks and was noticed to be coughing at lunch today.   ?

## 2021-12-10 NOTE — Assessment & Plan Note (Signed)
Patient initially was hypothermic and warming blanket. ?

## 2021-12-10 NOTE — ED Notes (Signed)
Pt refusing to allow this RN get a repeat temperature at this time ?

## 2021-12-10 NOTE — H&P (Signed)
?History and Physical  ? ? ?Patient: Leah Ritter DOB: 05/30/1951 ?DOA: 12/10/2021 ?DOS: the patient was seen and examined on 12/10/2021 ?PCP: Lesia Hausen, MD  ?Patient coming from:SNF ? ?Chief Complaint:  ?Chief Complaint  ?Patient presents with  ? Altered Mental Status  ? ?HPI: Leah Ritter is a 71 y.o. female with medical history significant of DM II, HTN, coming from SNF  for AMS. ?Per daughter Leah Ritter [phone] at bedside: ?>she went to SNF and found her in her room sitting in chair, with vomitus  all over floor and mom was unresponsive but breathing at 1 :30 pm today. ?>when mom was not responding she tried to wake her up and was unsuccessful and asked med tech to check her vitals. ?> BP check at that time was 118 over 80's. ?> NT told nurse and pt had leftover medicine on top of her refrigerator and was not taking her meds. And also todays medicine was in pudding cup and she has not taken that either.  ?>she told nurse mom has been throwing up all day.  ?> they called EMS because of vomit and non responding.  ?> per daughter mom at baseline mom would converse if stimulated and she talks and is oriented . ?EMS got there and evaluated her and were concerned with UTI. ?When pt stood up she smelled of urine.  ?BGT was over 100. EMS SBP was 96/50. ?They loaded pt and bought her to ED.  ? ?>On Tuesday she was sleeping in lounge area on recliner.  ?But she has been more sleepy over last two or three weeks.  ? ?In ed pt was found to be hypothermic and unresponsive and concern for pneumonia. 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  ?Pt also wears a diapers , as it is hard for her to use restroom and forgets to urinate and wets herself.  ? ? ?Review of Systems: unable to review all systems due to the inability of the patient to answer questions. ?Past Medical History:  ?Diagnosis Date  ? A-fib ([place])   ? Anxiety   ? CHF (congestive heart failure) ([place])   ? Diabetes ([place])   ? GERD  (gastroesophageal reflux disease)   ? Hypertension   ? Sleep apnea   ? Stroke [facility])   ? ?Past Surgical History:  ?Procedure Laterality Date  ? PARTIAL HYSTERECTOMY    ? tubligation    ? ?Social History:  reports that she has never smoked. She has never used smokeless tobacco. She reports that she does not currently use alcohol. She reports that she does not use drugs. ? ?Allergies  ?Allergen Reactions  ? Oxycodone-Acetaminophen Nausea And Vomiting  ?  Other reaction(s): Vomiting  ? Ezetimibe Other (See Comments)  ?  Reaction unknown  ? Lisinopril Other (See Comments)  ?  Reaction unknown ?Other reaction(s): Unknown  ? Statins Other (See Comments)  ?  Reaction unknown   ? ? ?Family History  ?Problem Relation Age of Onset  ? Breast cancer Mother   ? Stroke Father   ? Hypertension Father   ? Kidney disease Brother   ? Cancer Brother   ? ? ?Prior to Admission medications   ?Medication Sig Start Date End Date Taking? Authorizing Provider  ?ALPRAZolam (XANAX) 0.25 MG tablet Take 0.25 mg by mouth 2 (two) times daily as needed (for agitation).    [provider]  ?ALPRAZolam (XANAX) 0.25 MG tablet Take 1 tablet (0.25 mg total) by mouth 2 (two)  times daily. 07/14/21 07/14/22  Pokhrel, Corrie Mckusick, MD  ?amLODipine (NORVASC) 5 MG tablet Take 1 tablet (5 mg total) by mouth daily. 07/15/21   Pokhrel, Corrie Mckusick, MD  ?aspirin EC 81 MG tablet Take 81 mg by mouth daily.    [provider]  ?atorvastatin (LIPITOR) 80 MG tablet Take 80 mg by mouth daily at 6 PM.    [provider]  ?bisacodyl (DULCOLAX) 10 MG suppository Place 1 suppository (10 mg total) rectally daily as needed for moderate constipation. 07/14/21   Pokhrel, Corrie Mckusick, MD  ?CONTOUR NEXT TEST test strip 1 each by Other route every evening. 11/14/17   [provider]  ?feeding supplement (ENSURE ENLIVE / ENSURE PLUS) LIQD Take 237 mLs by mouth 3 (three) times daily between meals. 07/14/21   Pokhrel, Corrie Mckusick, MD  ?furosemide (LASIX) 40 MG tablet  Take 1 tablet (40 mg total) by mouth daily as needed for fluid or edema. 05/08/21   Lorella Nimrod, MD  ?glipiZIDE (GLUCOTROL) 10 MG tablet Take 10 mg by mouth 2 (two) times daily before a meal.     [provider]  ?guaiFENesin (ROBITUSSIN) 100 MG/5ML SOLN Take 20 mLs (400 mg total) by mouth every 4 (four) hours. 07/14/21   Pokhrel, Corrie Mckusick, MD  ?metoprolol succinate (TOPROL-XL) 50 MG 24 hr tablet Take 150 mg by mouth daily. 11/24/18   [provider]  ?mirtazapine (REMERON SOL-TAB) 15 MG disintegrating tablet Take 1 tablet (15 mg total) by mouth at bedtime. 05/08/21   Lorella Nimrod, MD  ?Multiple Vitamin (MULTIVITAMIN WITH MINERALS) TABS tablet Take 1 tablet by mouth daily. 07/15/21   Pokhrel, Corrie Mckusick, MD  ?MYRBETRIQ 25 MG TB24 tablet Take 25 mg by mouth daily. 04/29/21   [provider]  ?polyethylene glycol (MIRALAX / GLYCOLAX) 17 g packet Take 17 g by mouth daily as needed for moderate constipation. 07/14/21   Pokhrel, Corrie Mckusick, MD  ?QUEtiapine (SEROQUEL) 50 MG tablet Take 50 mg by mouth at bedtime. 04/29/21   [provider]  ?spironolactone (ALDACTONE) 25 MG tablet Take 25 mg by mouth daily. 04/29/21   [provider]  ?Vitamin D, Ergocalciferol, 2000 units CAPS Take 2,000 Units by mouth every morning.    [provider]  ?XARELTO 20 MG TABS tablet Take 20 mg by mouth daily. 04/29/21   [provider]  ? ? ?Physical Exam: ?Vitals:  ? 12/10/21 2032 12/10/21 2100 12/10/21 2207 12/10/21 2230  ?BP: (!) 103/55 (!) 100/47 (!) 115/49 (!) 118/55  ?Pulse: 91 80 80 84  ?Resp: 16 10 13 14   ?Temp:      ?TempSrc:      ?SpO2: 99% 100% 100% 100%  ?Weight:      ?Height:      ?Physical Exam ?Vitals and nursing note reviewed.  ?Constitutional:   ?   General: She is not in acute distress. ?   Appearance: Normal appearance. She is not ill-appearing, toxic-appearing or diaphoretic.  ?HENT:  ?   Head: Normocephalic and atraumatic.  ?   Right Ear: Hearing and external ear normal.  ?    Left Ear: Hearing and external ear normal.  ?   Nose: Nose normal. No nasal deformity.  ?   Mouth/Throat:  ?   Lips: Pink.  ?   Mouth: Mucous membranes are dry.  ?   Tongue: No lesions.  ?   Pharynx: Oropharynx is clear.  ?Eyes:  ?   Extraocular Movements: Extraocular movements intact.  ?   Pupils: Pupils are equal, round,  and reactive to light.  ?Cardiovascular:  ?   Rate and Rhythm: Normal rate and regular rhythm.  ?   Pulses: Normal pulses.  ?   Heart sounds: Normal heart sounds.  ?Pulmonary:  ?   Effort: Pulmonary effort is normal.  ?   Breath sounds: Normal breath sounds.  ?Abdominal:  ?   General: Bowel sounds are normal. There is no distension.  ?   Palpations: Abdomen is soft. There is no mass.  ?   Tenderness: There is no abdominal tenderness. There is no guarding.  ?   Hernia: No hernia is present.  ?Musculoskeletal:     ?   General: Swelling present.  ?   Right lower leg: Edema present.  ?   Left lower leg: Edema present.  ?Skin: ?   General: Skin is warm.  ?Neurological:  ?   General: No focal deficit present.  ?   Mental Status: She is alert. She is disoriented.  ?   Cranial Nerves: Cranial nerves 2-12 are intact.  ?   Motor: Motor function is intact.  ?Psychiatric:     ?   Attention and Perception: Attention normal.     ?   Mood and Affect: Mood normal.     ?   Speech: Speech normal.     ?   Behavior: Behavior normal. Behavior is cooperative.     ?   Cognition and Memory: Cognition normal.  ? ? ? ?Data Reviewed: ?Results for orders placed or performed during the hospital encounter of 12/10/21 (from the past 24 hour(s))  ?CBC     Status: Abnormal  ? Collection Time: 12/10/21  4:54 PM  ?Result Value Ref Range  ? WBC 9.6 4.0 - 10.5 K/uL  ? RBC 4.17 3.87 - 5.11 MIL/uL  ? Hemoglobin 11.5 (L) 12.0 - 15.0 g/dL  ? HCT 37.1 36.0 - 46.0 %  ? MCV 89.0 80.0 - 100.0 fL  ? MCH 27.6 26.0 - 34.0 pg  ? MCHC 31.0 30.0 - 36.0 g/dL  ? RDW 17.0 (H) 11.5 - 15.5 %  ? Platelets 126 (L) 150 - 400 K/uL  ? nRBC 0.3 (H) 0.0 - 0.2  %  ?Urinalysis, Complete w Microscopic Urine, Catheterized     Status: Abnormal  ? Collection Time: 12/10/21  4:54 PM  ?Result Value Ref Range  ? Color, Urine YELLOW (A) YELLOW  ? APPearance HAZY (A) CLEAR  ? Specif

## 2021-12-10 NOTE — Assessment & Plan Note (Addendum)
HbA1c 9.7.  Episodes of hypoglycemia due to poor oral intake.  Now improved.  Continue to monitor CBGs.  May resume glipizide.   ?

## 2021-12-10 NOTE — Assessment & Plan Note (Addendum)
Patient has a history of stroke and atrial fibrillation. ?Patient on Xarelto for stroke prevention.  MRI was done during this admission which does not show any acute stroke. ?

## 2021-12-10 NOTE — Assessment & Plan Note (Deleted)
Aspiration precautions.  PPI. ? ?

## 2021-12-10 NOTE — Assessment & Plan Note (Addendum)
Discontinue Aldactone.  May resume furosemide at discharge.  Monitor electrolytes closely ?

## 2021-12-10 NOTE — ED Provider Notes (Signed)
? ? ?[facility] ?Emergency Department Provider Note ? ? ? ? Event Date/Time  ? First MD Initiated Contact with Patient 12/10/21 1657   ?  (approximate) ? ? ?History  ? ?Altered Mental Status ? ? ?HPI ? ?History provided by EMS and patient's adult daughter who is at bedside. ? ?Leah Ritter is a 71 y.o. female presents to the ED from her facility.  Patient was last reported well by staff at lunch.  Daughter found the patient in her room sometime after lunch, covered in  vomitus, obtunded and responding only to painful stimuli.  She has a known history of hypertension, stroke, diabetes, CHF, A-fib on anticoagulation, and history of aspiration pneumonia.  The daughter reports that the patient was lethargic and not responding as per usual when she found her.  Patient presented to the ED via EMS with a strong odor of urine present by report.  There is also report from EMS that the patient was coughing while at lunch today.  Patient states initially arrived hypo-thermic on presentation with a temperature 95.8 ?F per rectal temp. ?  ? ? ?Physical Exam  ? ?Triage Vital Signs: ?ED Triage Vitals  ?Enc Vitals Group  ?   BP 12/10/21 1655 137/64  ?   Pulse Rate 12/10/21 1655 73  ?   Resp 12/10/21 1655 18  ?   Temp 12/10/21 1655 (!) 95.8 ?F (35.4 ?C)  ?   Temp Source 12/10/21 1655 Rectal  ?   SpO2 12/10/21 1655 97 %  ?   Weight 12/10/21 1651 211 lb 10.3 oz (96 kg)  ?   Height 12/10/21 1651 5\' 10"  (1.778 m)  ?   Head Circumference --   ?   Peak Flow --   ?   Pain Score --   ?   Pain Loc --   ?   Pain Edu? --   ?   Excl. in GC? --   ? ? ?Most recent vital signs: ?Vitals:  ? 12/12/21 1245 12/12/21 1655  ?BP: (!) 110/45 (!) 103/32  ?Pulse: 83 73  ?Resp: 15 17  ?Temp: (!) 97.5 ?F (36.4 ?C) 98.2 ?F (36.8 ?C)  ?SpO2: 100% 100%  ? ? ?General Awake, no distress. Responds to verbal and tactile stimuli on evaluation. Opened eyes upon request. ?HEENT NCAT. PERRL. EOMI. No rhinorrhea. Mucous membranes are moist.   ?CV:  Good peripheral perfusion.  ?RESP:  Normal effort.  ?ABD:  No distention. Soft, nontender ? ?ED Results / Procedures / Treatments  ? ?Labs ?(all labs ordered are listed, but only abnormal results are displayed) ?Labs Reviewed  ?URINE CULTURE - Abnormal; Notable for the following components:  ?    Result Value  ? Culture >=100,000 COLONIES/mL YEAST (*)   ? All other components within normal limits  ?CULTURE, BLOOD (ROUTINE X 2) - Abnormal; Notable for the following components:  ? Culture   (*)   ? Value: STAPHYLOCOCCUS CAPITIS ?THE SIGNIFICANCE OF ISOLATING THIS ORGANISM FROM A SINGLE SET OF BLOOD CULTURES WHEN MULTIPLE SETS ARE DRAWN IS UNCERTAIN. PLEASE NOTIFY THE MICROBIOLOGY DEPARTMENT WITHIN ONE WEEK IF SPECIATION AND SENSITIVITIES ARE REQUIRED. ?Performed at [facility], [address] [place], [place] [address] ?  ? All other components within normal limits  ?BLOOD CULTURE ID PANEL (REFLEXED) - BCID2 - Abnormal; Notable for the following components:  ? Staphylococcus species DETECTED (*)   ? All other components within normal limits  ?CBC - Abnormal; Notable for the  following components:  ? Hemoglobin 11.5 (*)   ? RDW 17.0 (*)   ? Platelets 126 (*)   ? nRBC 0.3 (*)   ? All other components within normal limits  ?URINALYSIS, COMPLETE (UACMP) WITH MICROSCOPIC - Abnormal; Notable for the following components:  ? Color, Urine YELLOW (*)   ? APPearance HAZY (*)   ? Leukocytes,Ua SMALL (*)   ? All other components within normal limits  ?PROTIME-INR - Abnormal; Notable for the following components:  ? Prothrombin Time 17.0 (*)   ? INR 1.4 (*)   ? All other components within normal limits  ?APTT - Abnormal; Notable for the following components:  ? aPTT 37 (*)   ? All other components within normal limits  ?COMPREHENSIVE METABOLIC PANEL - Abnormal; Notable for the following components:  ? Potassium 7.1 (*)   ? Glucose, Bld 120 (*)   ? BUN 53 (*)   ? Creatinine, Ser 1.76 (*)   ? Total Protein 8.4 (*)   ?  AST 55 (*)   ? ALT 68 (*)   ? Alkaline Phosphatase 135 (*)   ? GFR, Estimated 31 (*)   ? All other components within normal limits  ?CK - Abnormal; Notable for the following components:  ? Total CK 786 (*)   ? All other components within normal limits  ?BASIC METABOLIC PANEL - Abnormal; Notable for the following components:  ? Potassium 6.4 (*)   ? Glucose, Bld 125 (*)   ? BUN 55 (*)   ? Creatinine, Ser 1.69 (*)   ? GFR, Estimated 32 (*)   ? All other components within normal limits  ?BASIC METABOLIC PANEL - Abnormal; Notable for the following components:  ? Potassium 5.5 (*)   ? Glucose, Bld 102 (*)   ? BUN 51 (*)   ? Creatinine, Ser 1.56 (*)   ? GFR, Estimated 35 (*)   ? All other components within normal limits  ?CBC - Abnormal; Notable for the following components:  ? RBC 3.80 (*)   ? Hemoglobin 10.6 (*)   ? HCT 33.9 (*)   ? RDW 17.0 (*)   ? All other components within normal limits  ?CBC - Abnormal; Notable for the following components:  ? Hemoglobin 11.5 (*)   ? RDW 16.6 (*)   ? All other components within normal limits  ?POTASSIUM - Abnormal; Notable for the following components:  ? Potassium 5.5 (*)   ? All other components within normal limits  ?CBC - Abnormal; Notable for the following components:  ? RBC 3.78 (*)   ? Hemoglobin 10.6 (*)   ? HCT 33.0 (*)   ? RDW 16.6 (*)   ? All other components within normal limits  ?COMPREHENSIVE METABOLIC PANEL - Abnormal; Notable for the following components:  ? BUN 43 (*)   ? Creatinine, Ser 1.48 (*)   ? Albumin 3.4 (*)   ? AST 51 (*)   ? ALT 53 (*)   ? GFR, Estimated 38 (*)   ? All other components within normal limits  ?CULTURE, BLOOD (ROUTINE X 2)  ?RESP PANEL BY RT-PCR (FLU A&B, COVID) ARPGX2  ?MRSA NEXT GEN BY PCR, NASAL  ?LACTIC ACID, PLASMA  ?BRAIN NATRIURETIC PEPTIDE  ?PROCALCITONIN  ?PROCALCITONIN  ?GAMMA GT  ?MAGNESIUM  ?PROCALCITONIN  ?HEPATITIS PANEL, ACUTE  ?HEMOGLOBIN A1C  ?CBC  ?COMPREHENSIVE METABOLIC PANEL  ?TYPE AND SCREEN  ?TROPONIN I (HIGH  SENSITIVITY)  ?TROPONIN I (HIGH SENSITIVITY)  ? ? ? ?EKG ? ?  Vent. rate 79 BPM ?PR interval 164 ms ?QRS duration 112 ms ?QT/QTcB 372/427 ms ?P-R-T axes 61 63 -60 ?No STEMI ? ?RADIOLOGY ? ?I personally viewed and evaluated these images as part of my medical decision making, as well as reviewing the written report by the radiologist. ? ?ED Provider Interpretation: no acute findings} ? ?EEG adult ? ?Result Date: 12/11/2021 ?Jefferson Fuel, MD     12/11/2021  8:30 PM Routine EEG Report Leah Ritter is a 71 y.o. female with a history of encephalopathy who is undergoing an EEG to evaluate for seizures. Report: This EEG was acquired with electrodes placed according to the International 10-20 electrode system (including Fp1, Fp2, F3, F4, C3, C4, P3, P4, O1, O2, T3, T4, T5, T6, A1, A2, Fz, Cz, Pz). The following electrodes were missing or displaced: none. The best background was up to 9 Hz although there was intermittent diffuse slowing approximately 5 Hz. This activity is reactive to stimulation. Drowsiness was manifested by background fragmentation; deeper stages of sleep were not identified. There was no focal slowing. There were no interictal epileptiform discharges. There were no electrographic seizures identified. There was no abnormal response to photic stimulation or hyperventilation. Impression and clinical correlation: This EEG was obtained while awake and drowsy and is abnormal due to intermittent moderate diffuse slowing indicative of global cerebral dysfunction. Epileptiform abnormalities were not seen during this recording. Bing Neighbors, MD [facility] [phone] If 7pm- 7am, please page neurology on call as listed in AMION.   ? ? ?PROCEDURES: ? ?Critical Care performed: No ? ?Procedures ? ? ?MEDICATIONS ORDERED IN ED: ?Medications  ?pantoprazole (PROTONIX) injection 40 mg (40 mg Intravenous Given 12/11/21 2234)  ?0.9 %  sodium chloride infusion ( Intravenous New Bag/Given 12/11/21 2234)   ?cefTRIAXone (ROCEPHIN) 1 g in sodium chloride 0.9 % 100 mL IVPB (1 g Intravenous New Bag/Given 12/12/21 1040)  ?metoprolol tartrate (LOPRESSOR) injection 2.5 mg (has no administration in time range)  ?atorvastatin (LIPITOR)

## 2021-12-10 NOTE — Assessment & Plan Note (Deleted)
Differential diagnosis includes infection, possible aspiration pneumonia, UA possible UTI.  CT head did not show any acute findings.  MRI brain did not show stroke.  Ultrasound of the abdomen was done due to abnormal LFTs which did not show acute cholecystitis though it did show cholelithiasis.   ?Will initiate antibiotics and monitor for improvement.  Speech therapy to see as patient failed her swallow screen.   ?No obvious focal neurological deficits noted this morning. ?

## 2021-12-10 NOTE — Assessment & Plan Note (Addendum)
Due to low blood pressures her antihypertensives were held including Aldactone, metoprolol, furosemide.   ?Uptrending blood pressures were noted.  Started back on metoprolol.  Continue to hold Aldactone. ?

## 2021-12-11 ENCOUNTER — Other Ambulatory Visit: Payer: Self-pay

## 2021-12-11 ENCOUNTER — Emergency Department: Payer: Medicare Other

## 2021-12-11 DIAGNOSIS — D6489 Other specified anemias: Secondary | ICD-10-CM | POA: Diagnosis present

## 2021-12-11 DIAGNOSIS — N390 Urinary tract infection, site not specified: Secondary | ICD-10-CM | POA: Diagnosis present

## 2021-12-11 DIAGNOSIS — E11649 Type 2 diabetes mellitus with hypoglycemia without coma: Secondary | ICD-10-CM | POA: Diagnosis not present

## 2021-12-11 DIAGNOSIS — G9341 Metabolic encephalopathy: Secondary | ICD-10-CM

## 2021-12-11 DIAGNOSIS — Z8249 Family history of ischemic heart disease and other diseases of the circulatory system: Secondary | ICD-10-CM | POA: Diagnosis not present

## 2021-12-11 DIAGNOSIS — R7881 Bacteremia: Secondary | ICD-10-CM | POA: Diagnosis not present

## 2021-12-11 DIAGNOSIS — N3000 Acute cystitis without hematuria: Secondary | ICD-10-CM

## 2021-12-11 DIAGNOSIS — R4182 Altered mental status, unspecified: Secondary | ICD-10-CM | POA: Diagnosis not present

## 2021-12-11 DIAGNOSIS — N1832 Chronic kidney disease, stage 3b: Secondary | ICD-10-CM | POA: Diagnosis present

## 2021-12-11 DIAGNOSIS — Z823 Family history of stroke: Secondary | ICD-10-CM | POA: Diagnosis not present

## 2021-12-11 DIAGNOSIS — E861 Hypovolemia: Secondary | ICD-10-CM | POA: Diagnosis present

## 2021-12-11 DIAGNOSIS — F0150 Vascular dementia without behavioral disturbance: Secondary | ICD-10-CM | POA: Diagnosis present

## 2021-12-11 DIAGNOSIS — Z6830 Body mass index (BMI) 30.0-30.9, adult: Secondary | ICD-10-CM | POA: Diagnosis not present

## 2021-12-11 DIAGNOSIS — I130 Hypertensive heart and chronic kidney disease with heart failure and stage 1 through stage 4 chronic kidney disease, or unspecified chronic kidney disease: Secondary | ICD-10-CM | POA: Diagnosis present

## 2021-12-11 DIAGNOSIS — E875 Hyperkalemia: Secondary | ICD-10-CM | POA: Diagnosis present

## 2021-12-11 DIAGNOSIS — I5032 Chronic diastolic (congestive) heart failure: Secondary | ICD-10-CM

## 2021-12-11 DIAGNOSIS — J690 Pneumonitis due to inhalation of food and vomit: Secondary | ICD-10-CM | POA: Diagnosis present

## 2021-12-11 DIAGNOSIS — Z20822 Contact with and (suspected) exposure to covid-19: Secondary | ICD-10-CM | POA: Diagnosis present

## 2021-12-11 DIAGNOSIS — I4891 Unspecified atrial fibrillation: Secondary | ICD-10-CM | POA: Diagnosis present

## 2021-12-11 DIAGNOSIS — R7989 Other specified abnormal findings of blood chemistry: Secondary | ICD-10-CM | POA: Diagnosis present

## 2021-12-11 DIAGNOSIS — E669 Obesity, unspecified: Secondary | ICD-10-CM | POA: Diagnosis present

## 2021-12-11 DIAGNOSIS — E1122 Type 2 diabetes mellitus with diabetic chronic kidney disease: Secondary | ICD-10-CM | POA: Diagnosis present

## 2021-12-11 DIAGNOSIS — E860 Dehydration: Secondary | ICD-10-CM | POA: Diagnosis present

## 2021-12-11 DIAGNOSIS — Z803 Family history of malignant neoplasm of breast: Secondary | ICD-10-CM | POA: Diagnosis not present

## 2021-12-11 DIAGNOSIS — N179 Acute kidney failure, unspecified: Secondary | ICD-10-CM | POA: Diagnosis present

## 2021-12-11 DIAGNOSIS — Z794 Long term (current) use of insulin: Secondary | ICD-10-CM | POA: Diagnosis not present

## 2021-12-11 DIAGNOSIS — Z8673 Personal history of transient ischemic attack (TIA), and cerebral infarction without residual deficits: Secondary | ICD-10-CM | POA: Diagnosis not present

## 2021-12-11 LAB — CBC
HCT: 36.5 % (ref 36.0–46.0)
HCT: 33.9 % — ABNORMAL LOW (ref 36.0–46.0)
Hemoglobin: 11.5 g/dL — ABNORMAL LOW (ref 12.0–15.0)
Hemoglobin: 10.6 g/dL — ABNORMAL LOW (ref 12.0–15.0)
MCH: 28 pg (ref 26.0–34.0)
MCH: 27.9 pg (ref 26.0–34.0)
MCHC: 31.5 g/dL (ref 30.0–36.0)
MCHC: 31.3 g/dL (ref 30.0–36.0)
MCV: 88.8 fL (ref 80.0–100.0)
MCV: 89.2 fL (ref 80.0–100.0)
Platelets: 201 10*3/uL (ref 150–400)
Platelets: 196 10*3/uL (ref 150–400)
RBC: 4.11 MIL/uL (ref 3.87–5.11)
RBC: 3.8 MIL/uL — ABNORMAL LOW (ref 3.87–5.11)
RDW: 16.6 % — ABNORMAL HIGH (ref 11.5–15.5)
RDW: 17 % — ABNORMAL HIGH (ref 11.5–15.5)
WBC: 8.1 10*3/uL (ref 4.0–10.5)
WBC: 6.8 10*3/uL (ref 4.0–10.5)
nRBC: 0 % (ref 0.0–0.2)
nRBC: 0 % (ref 0.0–0.2)

## 2021-12-11 LAB — BLOOD CULTURE ID PANEL (REFLEXED) - BCID2

## 2021-12-11 LAB — POTASSIUM: Potassium: 5.5 mmol/L — ABNORMAL HIGH (ref 3.5–5.1)

## 2021-12-11 LAB — BASIC METABOLIC PANEL
Anion gap: 10 (ref 5–15)
BUN: 51 mg/dL — ABNORMAL HIGH (ref 8–23)
CO2: 22 mmol/L (ref 22–32)
Calcium: 9.5 mg/dL (ref 8.9–10.3)
Chloride: 106 mmol/L (ref 98–111)
Creatinine, Ser: 1.56 mg/dL — ABNORMAL HIGH (ref 0.44–1.00)
GFR, Estimated: 35 mL/min — ABNORMAL LOW (ref >60)
Glucose, Bld: 102 mg/dL — ABNORMAL HIGH (ref 70–99)
Potassium: 5.5 mmol/L — ABNORMAL HIGH (ref 3.5–5.1)
Sodium: 138 mmol/L (ref 135–145)

## 2021-12-11 LAB — TYPE AND SCREEN
ABO/RH(D): B POS
Antibody Screen: NEGATIVE

## 2021-12-11 LAB — TROPONIN I (HIGH SENSITIVITY): Troponin I (High Sensitivity): 16 ng/L (ref <18)

## 2021-12-11 LAB — GAMMA GT: GGT: 37 U/L (ref 7–50)

## 2021-12-11 LAB — CK: Total CK: 786 U/L — ABNORMAL HIGH (ref 38–234)

## 2021-12-11 LAB — PROCALCITONIN: Procalcitonin: <0.1 ng/mL

## 2021-12-11 LAB — MAGNESIUM: Magnesium: 2.1 mg/dL (ref 1.7–2.4)

## 2021-12-11 MED ORDER — SODIUM POLYSTYRENE SULFONATE 15 GM/60ML PO SUSP
45.0000 g | Freq: Once | ORAL | Status: AC
  Administered 2021-12-11: 45 g via RECTAL
  Filled 2021-12-11: qty 180

## 2021-12-11 MED ORDER — SODIUM ZIRCONIUM CYCLOSILICATE 10 G PO PACK
10.0000 g | PACK | ORAL | Status: AC
  Administered 2021-12-11 (×2): 10 g via ORAL
  Filled 2021-12-11 (×2): qty 1

## 2021-12-11 MED ORDER — METOPROLOL TARTRATE 5 MG/5ML IV SOLN: 2.5000 mg | Freq: Four times a day (QID) | INTRAVENOUS | Status: DC | PRN

## 2021-12-11 MED ORDER — HEPARIN SODIUM (PORCINE) 5000 UNIT/ML IJ SOLN
5000.0000 [IU] | Freq: Three times a day (TID) | INTRAMUSCULAR | Status: DC
  Administered 2021-12-11 – 2021-12-12 (×3): 5000 [IU] via SUBCUTANEOUS
  Filled 2021-12-11 (×3): qty 1

## 2021-12-11 MED ORDER — SODIUM POLYSTYRENE SULFONATE 15 GM/60ML PO SUSP: 30.0000 g | Freq: Once | ORAL | Status: DC

## 2021-12-11 MED ORDER — SODIUM CHLORIDE 0.9 % IV SOLN
1.0000 g | INTRAVENOUS | Status: AC
  Administered 2021-12-11 – 2021-12-14 (×4): 1 g via INTRAVENOUS
  Filled 2021-12-11: qty 10
  Filled 2021-12-11: qty 1
  Filled 2021-12-11: qty 10
  Filled 2021-12-11: qty 1

## 2021-12-11 MED ORDER — SODIUM CHLORIDE 0.9 % IV SOLN: INTRAVENOUS | Status: DC

## 2021-12-11 NOTE — Assessment & Plan Note (Addendum)
UA noted to be abnormal.  Initially treated with ceftriaxone.    ?Urine culture growing greater than 100,000 colonies of yeast.  Started on Diflucan.  No bacteria identified on UA.  Ceftriaxone was subsequently discontinued.  Diflucan for 5 more days. ?

## 2021-12-11 NOTE — Progress Notes (Addendum)
? ?[facility] ?PROGRESS NOTE ? ? ?Leah Ritter UUV:[id number] DOB: 1951-02-04 DOA: 12/10/2021  0 ?DOS: the patient was seen and examined on 12/11/2021 ? ?PCP: Mortimer Fries, MD ? ?Brief History and Hospital Course:  ?71 y.o. female with medical history significant of DM II, HTN, coming from SNF for AMS.  Apparently has been vomiting for the past few days.  Has been drooling.  Patient does have a history of stroke.  Unclear if she has any history of cognitive impairment.  Patient was hospitalized for further evaluation. ? ?Consultants: None ? ?Procedures: None yet ? ? ? ?Subjective: ?Patient confused.  Unable to answer any questions.  Does follow commands. ? ? ? ?Assessment/Plan: ? ? ?* Acute metabolic encephalopathy ?Differential diagnosis includes infection, possible aspiration pneumonia, UA possible UTI.  CT head did not show any acute findings.  MRI brain did not show stroke.  Ultrasound of the abdomen was done due to abnormal LFTs which did not show acute cholecystitis though it did show cholelithiasis.   ?Will initiate antibiotics and monitor for improvement.  Speech therapy to see as patient failed her swallow screen.   ?No obvious focal neurological deficits noted this morning.  MRI brain did not show white matter disease and is advanced atrophy which could suggest the patient has some degree of cognitive impairment and possibly vascular dementia. ? ? ?Hyperkalemia ?Noted to be severely hyperkalemic with potassium level of 7.  Etiology could be due to medications.  She was noted to be on Aldactone.  Dehydration and acute kidney injury also contributing.  She was given dextrose insulin and calcium. ?Kayexalate was ordered however due to her high aspiration risk she was not given this medication. ?Kayexalate enema ordered this morning.  Potassium is noted to be 5.5. ? ?AKI (acute kidney injury) (HCC) ?Creatinine was 1.02 in October 2022.  Presented with creatinine of 1.76.  Likely dehydrated from nausea and  vomiting and poor oral intake.  Improving with IV fluids.  Continue IV fluids for now.  Monitor urine output.  Avoid nephrotoxic agents.  Diuretics on hold. ? ?UTI (urinary tract infection) ?UA noted to be abnormal.  Patient unable to tell if she is having any symptoms.  We will place her on ceftriaxone for now.  Follow-up on urine cultures. ? ?HYPERTENSION, BENIGN SYSTEMIC ?Blood pressure is noted to be borderline low.  Her antihypertensives have been held including Aldactone, metoprolol, furosemide and amlodipine.   ?Continue to monitor. ? ?Chronic diastolic CHF (congestive heart failure) (HCC) ?Continue to hold diuretics for now.  Continue gentle IV hydration.  Continues to be hypovolemic. ? ?A-fib (HCC) ?Appears to be paroxysmal.  EKG shows sinus rhythm.  On metoprolol and Xarelto prior to admission.  Currently on hold as she is unable to take by mouth.  Blood pressure is noted to be soft. ? ?Type 2 diabetes mellitus without complication, with long-term current use of insulin (HCC) ?Glipizide is on hold.  Continue SSI.  Monitor CBGs.  HbA1c 10.5 in August 2022.  Will recheck. ? ?Anemia ?Hemoglobin stable for the most part.  No evidence of overt bleeding. ? ?History of stroke ?Patient has a history of stroke and atrial fibrillation. ?Patient on Xarelto for stroke prevention.  MRI was done during this admission which does not show any acute stroke. ?Xarelto on hold as patient is unable to take orally at this time. ? ?GERD (gastroesophageal reflux disease) ?Aspiration precautions.  PPI. ? ? ?Sleep apnea ?CPAP per home settings.   ? ?Abnormal LFTs ?AST  and ALT noted to be mildly elevated.  Abdomen is benign.  Ultrasound of the upper right quadrant showed cholelithiasis without any evidence for cholecystitis.  No focal liver lesions were noted.  Continue to trend. ? ?Hypothermia ?Patient initially was hypothermic and warming blanket. ? ? ?Obesity ?Estimated body mass index is 30.37 kg/m? as calculated from the  following: ?  Height as of this encounter: 5\' 10"  (1.778 m). ?  Weight as of this encounter: 96 kg. ? ? ?DVT Prophylaxis: Subcutaneous heparin ?Code Status: Full code ?Family Communication: We will update daughter later today ?Disposition Plan: Back to SNF when improved ? ?Status is: Inpatient ?Remains inpatient appropriate because: Acute kidney injury with hyperkalemia, altered mental status ? ? ? ? ?Medications: Scheduled: ? heparin  5,000 Units Subcutaneous Q8H  ? pantoprazole (PROTONIX) IV  40 mg Intravenous Q24H  ? ?Continuous: ? sodium chloride 50 mL/hr at 12/11/21 0140  ? ?PRN: ? ?Antibiotics: ?Anti-infectives (From admission, onward)  ? ? Start     Dose/Rate Route Frequency Ordered Stop  ? 12/10/21 1945  cefTRIAXone (ROCEPHIN) 2 g in sodium chloride 0.9 % 100 mL IVPB  Status:  Discontinued       ? 2 g ?200 mL/hr over 30 Minutes Intravenous  Once 12/10/21 1944 12/10/21 2022  ? ?  ? ? ?Objective: ? ?Vital Signs ? ?Vitals:  ? 12/11/21 0845 12/11/21 0900 12/11/21 0915 12/11/21 0930  ?BP:  106/66  127/85  ?Pulse: 94 98 95   ?Resp: 16 18 16    ?Temp:      ?TempSrc:      ?SpO2: 90% (!) 88% 90% 92%  ?Weight:      ?Height:      ? ?No intake or output data in the 24 hours ending 12/11/21 0943 ?Filed Weights  ? 12/10/21 1651  ?Weight: 96 kg  ? ? ?General appearance: Awake alert.  In no distress.  Distracted ?Resp: Coarse breath sound bilaterally with few crackles bilateral bases.  No wheezing or rhonchi. ?Cardio: S1-S2 is normal regular.  No S3-S4.  No rubs murmurs or bruit ?GI: Abdomen is soft.  Nontender nondistended.  Bowel sounds are present normal.  No masses organomegaly ?Extremities: No edema.  Moving all 4 extremities.  Physical deconditioning noted. ?Neurologic: Disoriented.  No focal neurological deficits.  ? ? ?Lab Results: ? ?Data Reviewed: I have personally reviewed labs and imaging study reports ? ?CBC: ?Recent Labs  ?Lab 12/10/21 ?1654 12/11/21 ?0003 12/11/21 ?0754  ?WBC 9.6 8.1 6.8  ?HGB 11.5* 11.5*  10.6*  ?HCT 37.1 36.5 33.9*  ?MCV 89.0 88.8 89.2  ?PLT 126* 201 196  ? ? ?Basic Metabolic Panel: ?Recent Labs  ?Lab 12/10/21 ?1912 12/10/21 ?2257 12/11/21 ?0000 12/11/21 ?0754  ?NA 135 136  --  138  ?K 7.1* 6.4*  --  5.5*  ?CL 102 105  --  106  ?CO2 24 23  --  22  ?GLUCOSE 120* 125*  --  102*  ?BUN 53* 55*  --  51*  ?CREATININE 1.76* 1.69*  --  1.56*  ?CALCIUM 10.0 9.8  --  9.5  ?MG  --   --  2.1  --   ? ? ?GFR: ?Estimated Creatinine Clearance: 41.5 mL/min (A) (by C-G formula based on SCr of 1.56 mg/dL (H)). ? ?Liver Function Tests: ?Recent Labs  ?Lab 12/10/21 ?1912  ?AST 55*  ?ALT 68*  ?ALKPHOS 135*  ?BILITOT 0.4  ?PROT 8.4*  ?ALBUMIN 3.6  ? ? ? ?Coagulation Profile: ?Recent Labs  ?Lab  12/10/21 ?1951  ?INR 1.4*  ? ? ?Cardiac Enzymes: ?Recent Labs  ?Lab 12/11/21 ?0754  ?CKTOTAL 786*  ? ? ? ?Recent Results (from the past 240 hour(s))  ?Culture, blood (routine x 2)     Status: None (Preliminary result)  ? Collection Time: 12/10/21  7:12 PM  ? Specimen: BLOOD  ?Result Value Ref Range Status  ? Specimen Description BLOOD LEFT ASSIST CONTROL  Final  ? Special Requests   Final  ?  BOTTLES DRAWN AEROBIC AND ANAEROBIC Blood Culture results may not be optimal due to an inadequate volume of blood received in culture bottles  ? Culture   Final  ?  NO GROWTH < 12 HOURS ?Performed at [facility], [address] [place], [place] [address] ?  ? Report Status PENDING  Incomplete  ?Resp Panel by RT-PCR (Flu A&B, Covid) Nasopharyngeal Swab     Status: None  ? Collection Time: 12/10/21  7:50 PM  ? Specimen: Nasopharyngeal Swab; Nasopharyngeal(NP) swabs in vial transport medium  ?Result Value Ref Range Status  ? SARS Coronavirus 2 by RT PCR NEGATIVE NEGATIVE Final  ?  Comment: (NOTE) ?SARS-CoV-2 target nucleic acids are NOT DETECTED. ? ?The SARS-CoV-2 RNA is generally detectable in upper respiratory ?specimens during the acute phase of infection. The lowest ?concentration of SARS-CoV-2 viral copies this assay can detect  is ?138 copies/mL. A negative result does not preclude SARS-Cov-2 ?infection and should not be used as the sole basis for treatment or ?other patient management decisions. A negative result may occur with  ?impro

## 2021-12-11 NOTE — Progress Notes (Signed)
Eeg done 

## 2021-12-11 NOTE — Progress Notes (Signed)
Admission profile updated. ?

## 2021-12-11 NOTE — TOC Progression Note (Signed)
Transition of Care (TOC) - Progression Note  ? ? ?Patient Details  ?Name: Tyshell Ramberg ?MRN: [id number] ?Date of Birth: 1951-05-18 ? ?Transition of Care (TOC) CM/SW Contact  ?Trayvon Trumbull A Khamora Karan, LCSW ?Phone Number: ?12/11/2021, 9:53 AM ? ?Clinical Narrative:   Pt admitted to the floor. Pt is from [facility] ? ? ? ?  ?  ? ?Expected Discharge Plan and Services ?  ?  ?  ?  ?  ?                ?  ?  ?  ?  ?  ?  ?  ?  ?  ?  ? ? ?Social Determinants of Health (SDOH) Interventions ?  ? ?Readmission Risk Interventions ?No flowsheet data found. ? ?

## 2021-12-11 NOTE — Evaluation (Signed)
Clinical/Bedside Swallow Evaluation ?Patient Details  ?Name: Leah Ritter ?MRN: [id number] ?Date of Birth: 20-Feb-1951 ? ?Today's Date: 12/11/2021 ?Time: SLP Start Time (ACUTE ONLY): 1330 SLP Stop Time (ACUTE ONLY): 1430 ?SLP Time Calculation (min) (ACUTE ONLY): 60 min ? ?Past Medical History:  ?Past Medical History:  ?Diagnosis Date  ? A-fib ([place])   ? Anxiety   ? CHF (congestive heart failure) ([place])   ? Diabetes ([place])   ? GERD (gastroesophageal reflux disease)   ? Hypertension   ? Sleep apnea   ? Stroke [facility])   ? ?Past Surgical History:  ?Past Surgical History:  ?Procedure Laterality Date  ? PARTIAL HYSTERECTOMY    ? tubligation    ? ?HPI:  ?Leah Ritter is a 71 y.o. female with medical history significant of GERD, CHF, DM II, HTN, coming from [facility] unit for AMS.  Apparently has been vomiting for the past few days.  Per nurse at [facility], Leah Ritter had leftover medicine in her puddings in her frig and was not taking her meds. Nurse reported Leah Ritter had been throwing up "all day".  In Ed, Leah Ritter was found to be hypothermic and unresponsive w/ concern for pneumonia, possibly from vomitus episodes.  Leah Ritter is able to ambulate w/ a walker and feed herself at [facility].  She "sometimes" uses a dysphagia drink provided for her, or otherwise drinks from a cup.   ? ?CXR: Cardiomegaly with vascular congestion and likely interstitial edema.   ?MRI results: "No acute intracranial process; Sequela of prior posterior right frontal lobe infarct, with encephalomalacia and hemosiderin deposition, likely petechial hemorrhage. T2 hyperintense signal in  the periventricular white matter, likely the sequela of chronic  small vessel ischemic disease. Mildly advanced cerebral volume loss  for age.".  ? ?  ?Assessment / Plan / Recommendation  ?Clinical Impression ? Leah Ritter seen for BSE today. Daughter, family member present. Leah Ritter sitting in chair; talkative but quite tangential. Dysfluency and poor follow through noted despite cues. Speech mumbled at  times. She smiled and seemed eager for something to [place].  ? ?OF NOTE: Leah Ritter has a h/o oropharyngeal phase dysphagia and was seen for MBSS on 07/2021 w/ results as described: "Leah Ritter presents w/ moderate oropharyngeal dysphagia with cognitive, sensory and motor deficits. Oral stage is characterized by disorganized lingual transport and reduced bolus containment, with premature spillage of liquids to the pyriform sinuses before the swallow. There is left sided facial, lingual weakness. Lingual pumping as Leah Ritter prepares to initiate swallow, and piecemeal deglutition is noted. Penetration/aspiration occurs during the swallow intermittently with thin and nectar thick liquids due to delayed onset of swallow initiation/laryngeal closure. There is also laryngeal penetration of puree. Leah Ritter did not sense aspiration, but was able to eject penetration/aspiration with a cued cough. Protrusion of tissue from posterior wall at level of C6-7 consistent with prominent cricopharyngeus; there is intermittent minimal/mild retention and backflow through the UES, greater with solids/ liquids. Suspect chronic, low grade aspiration, and Leah Ritter at risk for aspiration with any consistency due to impaired sensation and cognitive impairment. Discussed aspiration risks and options for treatment in the context of progression of neurologic disease, including texture modifications, non-oral means of feeding, and impacts on quality of life, as well as use of strategies with an oral diet which may minimize/mitigate but not eliminate aspiration risk. Recommend Palliative Care consult or discussion with MD to clarify goals of care in context of chronic dysphagia which may worsen with disease progression.".  ? ?Leah Ritter has been using a Provale  drink cup at [facility] intermittently; she is somewhat supervised during meals for Cup drinking and instructed to use small sips, per family report. She has been eating a Regular diet at [facility], per family  report.  ? ?At this BSE, Leah Ritter appears to present w/ oral phase dsyphagia c/b disorganized and lengthy bolus management -- suspect in setting of declined Cognitive status; Baseline Dementia. During the pharyngeal phase of swallowing, no overt coughing or decline in vocal quality noted; no decline in respiratory status noted. Leah Ritter was easily distracted and required MOD+ verbal/visual cues for follow through. ANY Cognitive decline and distraction can impact her overall attention/awareness/timing of swallow and safety during po tasks which increases risk for aspiration, choking.  ?Leah Ritter's risk for aspiration is present in setting of Cognitive decline, Dementia as well as Baseline oropharyngeal phase dysphagia as per MBSS in 07/2021. It can be reduced when following aspiration precautions, giving feeding support, reducing distractions during oral intake including talking, and using a strategies to help ensure SINGLE, SMALL sips, such as w/ a Dysphagia drink cup(Provale cup). Also recommend a slight modified diet consistency w/ chopped/cut foods for ease of oral phase.  ? ?Leah Ritter consumed trials of thin liquids via Cup(1 sip at at time, small) then puree and soft solids w/ No immediate, overt clinical s/s of aspiration noted; no decline in vocal quality; no cough; and no decline in respiratory status during/post trials. Oral phase was adequate w/ liquids and purees but min increased Time and slower bolus management during trials of increased texture. W/ min extra Time to complete the swallow and for full oral clearing, Leah Ritter cleared oral cavity b/t trials. Slight saliva residue was noted at Left corner of mouth which Daughter stated was Baseline "for some time". Leah Ritter was able to hold small Cup during drinking. OM Exam revealed Left decreased tone in corner of mouth; labial closure and strength; min Left lingual weakness. Leah Ritter had upper denture and partial in place.  ? ?D/t Leah Ritter's Baseline, declined Cognitive status/Dementia and oropharyngeal  phase dysphagia, recommend initiation of the Dysphagia level 3(mech soft meats/foods)-Regular(family to prepare, cut) w/ thin liquids via Provale drink Cup or small CUP -- single, small sips; aspiration precautions; reduce Distractions during meals and engage Leah Ritter at meals for self-feeding. Strategies to include cough/throat clear post swallows. Pills Crushed in Puree for safer swallowing. Support w/ initiation and prep of plate/tray at meals as needed. Reflux precautions. Recommend Dietician f/u.  ? ?Recommend Family to f/u w/ Palliative Care to discuss the above -- for understanding of Leah Ritter's Baseline Cognitive decline and Dysphagia; and QOL choices. No acute ST services indicated currently. Monitoring at her facility if they see any negative sequelae from aspiration in future. MD/NSG updated. ?SLP Visit Diagnosis: Dysphagia, oropharyngeal phase (R13.12) (baseline dsyphagia; Dementia) ?   ?Aspiration Risk ? Mild aspiration risk;Moderate aspiration risk;Risk for inadequate nutrition/hydration (baseline deficits/risk)  ?  ?Diet Recommendation   Dysphagia level 3(mech soft meats/foods)-Regular(family to prepare, cut) w/ thin liquids via Provale drink Cup or small CUP -- single, small sips; aspiration precautions; reduce Distractions during meals and engage Leah Ritter at meals for self-feeding. Strategies to include cough/throat clear post swallows. Support w/ initiation and prep of plate/tray at meals as needed. Reflux precautions. ? ?Medication Administration: Crushed with puree  ?  ?Other  Recommendations Recommended Consults:  (Palliative Care consult for [place] d/t baseline Dysphagia and Cognitive decline; at risk for aspiration) ?Oral Care Recommendations: Oral care BID;Oral care before and after PO;Staff/trained caregiver to provide oral  care (denture care) ?Other Recommendations:  (n/a at this time)   ? ?Recommendations for follow up therapy are one component of a multi-disciplinary discharge planning process, led by the  attending physician.  Recommendations may be updated based on patient status, additional functional criteria and insurance authorization. ? ?Follow up Recommendations Follow physician's recommendations for

## 2021-12-11 NOTE — ED Notes (Signed)
Pt to MRI at this time.

## 2021-12-11 NOTE — Progress Notes (Signed)
PHARMACY - PHYSICIAN COMMUNICATION ?CRITICAL VALUE ALERT - BLOOD CULTURE IDENTIFICATION (BCID) ? ?Leah Ritter is an 71 y.o. female who presented to [facility] on 12/10/2021 with a chief complaint of AMS ? ?Assessment:  BCID resulted with 1 of 4 vials (aero) Staph Spp. Pt currently on Rocephin 1g q24h for ?UTI nothing in UCx yet. No leukocytosis, PCT <0.1 x2, Afebrile, and VSS. ? ?Name of physician (or Provider) Contacted: Dr. Bonnielee Haff ? ?Current antibiotics: Ceftriaxone IV 1g q24h ? ?Changes to prescribed antibiotics recommended:  ? ?Can consider repeat blood cultures and withhold escalation of abx for now. If patient shows signs of worsening or cultures result consider vancomycin. ?**Per D/w MD will hold off on any new orders at this time. ? ? ? ? ?Results for orders placed or performed during the hospital encounter of 12/10/21  ?Blood Culture ID Panel (Reflexed) (Collected: 12/10/2021  7:12 PM)  ?Result Value Ref Range  ? Enterococcus faecalis NOT DETECTED NOT DETECTED  ? Enterococcus Faecium NOT DETECTED NOT DETECTED  ? Listeria monocytogenes NOT DETECTED NOT DETECTED  ? Staphylococcus species DETECTED (A) NOT DETECTED  ? Staphylococcus aureus (BCID) NOT DETECTED NOT DETECTED  ? Staphylococcus epidermidis NOT DETECTED NOT DETECTED  ? Staphylococcus lugdunensis NOT DETECTED NOT DETECTED  ? Streptococcus species NOT DETECTED NOT DETECTED  ? Streptococcus agalactiae NOT DETECTED NOT DETECTED  ? Streptococcus pneumoniae NOT DETECTED NOT DETECTED  ? Streptococcus pyogenes NOT DETECTED NOT DETECTED  ? A.calcoaceticus-baumannii NOT DETECTED NOT DETECTED  ? Bacteroides fragilis NOT DETECTED NOT DETECTED  ? Enterobacterales NOT DETECTED NOT DETECTED  ? Enterobacter cloacae complex NOT DETECTED NOT DETECTED  ? Escherichia coli NOT DETECTED NOT DETECTED  ? Klebsiella aerogenes NOT DETECTED NOT DETECTED  ? Klebsiella oxytoca NOT DETECTED NOT DETECTED  ? Klebsiella pneumoniae NOT DETECTED NOT DETECTED  ? Proteus  species NOT DETECTED NOT DETECTED  ? Salmonella species NOT DETECTED NOT DETECTED  ? Serratia marcescens NOT DETECTED NOT DETECTED  ? Haemophilus influenzae NOT DETECTED NOT DETECTED  ? Neisseria meningitidis NOT DETECTED NOT DETECTED  ? Pseudomonas aeruginosa NOT DETECTED NOT DETECTED  ? Stenotrophomonas maltophilia NOT DETECTED NOT DETECTED  ? Candida albicans NOT DETECTED NOT DETECTED  ? Candida auris NOT DETECTED NOT DETECTED  ? Candida glabrata NOT DETECTED NOT DETECTED  ? Candida krusei NOT DETECTED NOT DETECTED  ? Candida parapsilosis NOT DETECTED NOT DETECTED  ? Candida tropicalis NOT DETECTED NOT DETECTED  ? Cryptococcus neoformans/gattii NOT DETECTED NOT DETECTED  ? ? ?Shanon Brow Jaquari Reckner ?12/11/2021  2:58 PM ? ?

## 2021-12-11 NOTE — Progress Notes (Signed)
? ?[facility] ?PROGRESS NOTE ? ? ?Leah Ritter [id number] DOB: Jan 06, 1951 DOA: 12/10/2021  0 ?DOS: the patient was seen and examined on 12/11/2021 ? ?PCP: Mortimer Fries, MD ? ?Brief History and Hospital Course:  ?71 y.o. female with medical history significant of DM II, HTN, coming from SNF for AMS.  Apparently has been vomiting for the past few days.  Has been drooling.  Patient does have a history of stroke.  Unclear if she has any history of cognitive impairment.  Patient was hospitalized for further evaluation. ? ?Consultants: None ? ?Procedures: None yet ? ? ? ?Subjective: ?Patient confused.  Unable to answer any questions.  Does follow commands. ? ? ? ?Assessment/Plan: ? ? ?* Acute metabolic encephalopathy ?Differential diagnosis includes infection, possible aspiration pneumonia, UA possible UTI.  CT head did not show any acute findings.  MRI brain did not show stroke.  Ultrasound of the abdomen was done due to abnormal LFTs which did not show acute cholecystitis though it did show cholelithiasis.   ?Will initiate antibiotics and monitor for improvement.  Speech therapy to see as patient failed her swallow screen.   ?No obvious focal neurological deficits noted this morning.  MRI brain did not show white matter disease and is advanced atrophy which could suggest the patient has some degree of cognitive impairment and possibly vascular dementia. ?Will order EEG. ? ? ?Hyperkalemia ?Noted to be severely hyperkalemic with potassium level of 7.  Etiology could be due to medications.  She was noted to be on Aldactone.  Dehydration and acute kidney injury also contributing.  She was given dextrose insulin and calcium. ?Kayexalate was ordered however due to her high aspiration risk she was not given this medication. ?Kayexalate enema ordered this morning.  Potassium is noted to be 5.5. ? ?AKI (acute kidney injury) (HCC) ?Creatinine was 1.02 in October 2022.  Presented with creatinine of 1.76.  Likely  dehydrated from nausea and vomiting and poor oral intake.  Improving with IV fluids.  Continue IV fluids for now.  Monitor urine output.  Avoid nephrotoxic agents.  Diuretics on hold. ? ?UTI (urinary tract infection) ?UA noted to be abnormal.  Patient unable to tell if she is having any symptoms.  We will place her on ceftriaxone for now.  Follow-up on urine cultures. ? ?HYPERTENSION, BENIGN SYSTEMIC ?Blood pressure is noted to be borderline low.  Her antihypertensives have been held including Aldactone, metoprolol, furosemide and amlodipine.   ?Continue to monitor. ? ?Chronic diastolic CHF (congestive heart failure) (HCC) ?Continue to hold diuretics for now.  Continue gentle IV hydration.  Continues to be hypovolemic. ? ?A-fib (HCC) ?Appears to be paroxysmal.  EKG shows sinus rhythm.  On metoprolol and Xarelto prior to admission.  Currently on hold as she is unable to take by mouth.  Blood pressure is noted to be soft. ? ?Type 2 diabetes mellitus without complication, with long-term current use of insulin (HCC) ?Glipizide is on hold.  Continue SSI.  Monitor CBGs.  HbA1c 10.5 in August 2022.  Will recheck. ? ?Anemia ?Hemoglobin stable for the most part.  No evidence of overt bleeding. ? ?History of stroke ?Patient has a history of stroke and atrial fibrillation. ?Patient on Xarelto for stroke prevention.  MRI was done during this admission which does not show any acute stroke. ?Xarelto on hold as patient is unable to take orally at this time. ? ?GERD (gastroesophageal reflux disease) ?Aspiration precautions.  PPI. ? ? ?Sleep apnea ?CPAP per home settings.   ? ?  Abnormal LFTs ?AST and ALT noted to be mildly elevated.  Abdomen is benign.  Ultrasound of the upper right quadrant showed cholelithiasis without any evidence for cholecystitis.  No focal liver lesions were noted.  Continue to trend. ? ?Hypothermia ?Patient initially was hypothermic and warming blanket. ? ? ?Obesity ?Estimated body mass index is 30.37 kg/m? as  calculated from the following: ?  Height as of this encounter: 5\' 10"  (1.778 m). ?  Weight as of this encounter: 96 kg. ? ? ?DVT Prophylaxis: Subcutaneous heparin ?Code Status: Full code ?Family Communication: We will update daughter later today ?Disposition Plan: Back to SNF when improved ? ?Status is: Inpatient ?Remains inpatient appropriate because: Acute kidney injury with hyperkalemia, altered mental status ? ? ? ? ?Medications: Scheduled: ? heparin  5,000 Units Subcutaneous Q8H  ? pantoprazole (PROTONIX) IV  40 mg Intravenous Q24H  ? ?Continuous: ? sodium chloride 50 mL/hr at 12/11/21 0140  ? cefTRIAXone (ROCEPHIN)  IV    ? ?PRN: ? ?Antibiotics: ?Anti-infectives (From admission, onward)  ? ? Start     Dose/Rate Route Frequency Ordered Stop  ? 12/11/21 1000  cefTRIAXone (ROCEPHIN) 1 g in sodium chloride 0.9 % 100 mL IVPB       ? 1 g ?200 mL/hr over 30 Minutes Intravenous Every 24 hours 12/11/21 0945    ? 12/10/21 1945  cefTRIAXone (ROCEPHIN) 2 g in sodium chloride 0.9 % 100 mL IVPB  Status:  Discontinued       ? 2 g ?200 mL/hr over 30 Minutes Intravenous  Once 12/10/21 1944 12/10/21 2022  ? ?  ? ? ?Objective: ? ?Vital Signs ? ?Vitals:  ? 12/11/21 0900 12/11/21 0915 12/11/21 0930 12/11/21 1011  ?BP: 106/66  127/85 106/67  ?Pulse: 98 95  99  ?Resp: 18 16  16   ?Temp:      ?TempSrc:      ?SpO2: (!) 88% 90% 92% 100%  ?Weight:      ?Height:      ? ?No intake or output data in the 24 hours ending 12/11/21 1108 ?Filed Weights  ? 12/10/21 1651  ?Weight: 96 kg  ? ? ?General appearance: Awake alert.  In no distress.  Distracted ?Resp: Coarse breath sound bilaterally with few crackles bilateral bases.  No wheezing or rhonchi. ?Cardio: S1-S2 is normal regular.  No S3-S4.  No rubs murmurs or bruit ?GI: Abdomen is soft.  Nontender nondistended.  Bowel sounds are present normal.  No masses organomegaly ?Extremities: No edema.  Moving all 4 extremities.  Physical deconditioning noted. ?Neurologic: Disoriented.  No focal  neurological deficits.  ? ? ?Lab Results: ? ?Data Reviewed: I have personally reviewed labs and imaging study reports ? ?CBC: ?Recent Labs  ?Lab 12/10/21 ?1654 12/11/21 ?0003 12/11/21 ?0754  ?WBC 9.6 8.1 6.8  ?HGB 11.5* 11.5* 10.6*  ?HCT 37.1 36.5 33.9*  ?MCV 89.0 88.8 89.2  ?PLT 126* 201 196  ? ? ?Basic Metabolic Panel: ?Recent Labs  ?Lab 12/10/21 ?1912 12/10/21 ?2257 12/11/21 ?0000 12/11/21 ?0754  ?NA 135 136  --  138  ?K 7.1* 6.4*  --  5.5*  ?CL 102 105  --  106  ?CO2 24 23  --  22  ?GLUCOSE 120* 125*  --  102*  ?BUN 53* 55*  --  51*  ?CREATININE 1.76* 1.69*  --  1.56*  ?CALCIUM 10.0 9.8  --  9.5  ?MG  --   --  2.1  --   ? ? ?GFR: ?Estimated Creatinine Clearance: 41.5  mL/min (A) (by C-G formula based on SCr of 1.56 mg/dL (H)). ? ?Liver Function Tests: ?Recent Labs  ?Lab 12/10/21 ?1912  ?AST 55*  ?ALT 68*  ?ALKPHOS 135*  ?BILITOT 0.4  ?PROT 8.4*  ?ALBUMIN 3.6  ? ? ? ?Coagulation Profile: ?Recent Labs  ?Lab 12/10/21 ?1951  ?INR 1.4*  ? ? ?Cardiac Enzymes: ?Recent Labs  ?Lab 12/11/21 ?0754  ?CKTOTAL 786*  ? ? ? ?Recent Results (from the past 240 hour(s))  ?Culture, blood (routine x 2)     Status: None (Preliminary result)  ? Collection Time: 12/10/21  7:12 PM  ? Specimen: BLOOD  ?Result Value Ref Range Status  ? Specimen Description BLOOD LEFT ASSIST CONTROL  Final  ? Special Requests   Final  ?  BOTTLES DRAWN AEROBIC AND ANAEROBIC Blood Culture results may not be optimal due to an inadequate volume of blood received in culture bottles  ? Culture   Final  ?  NO GROWTH < 12 HOURS ?Performed at [facility], [address], [place], [place] [address] ?  ? Report Status PENDING  Incomplete  ?Resp Panel by RT-PCR (Flu A&B, Covid) Nasopharyngeal Swab     Status: None  ? Collection Time: 12/10/21  7:50 PM  ? Specimen: Nasopharyngeal Swab; Nasopharyngeal(NP) swabs in vial transport medium  ?Result Value Ref Range Status  ? SARS Coronavirus 2 by RT PCR NEGATIVE NEGATIVE Final  ?  Comment: (NOTE) ?SARS-CoV-2 target  nucleic acids are NOT DETECTED. ? ?The SARS-CoV-2 RNA is generally detectable in upper respiratory ?specimens during the acute phase of infection. The lowest ?concentration of SARS-CoV-2 viral copies this assay can

## 2021-12-11 NOTE — Procedures (Signed)
Routine EEG Report ? ?Leah Ritter is a 71 y.o. female with a history of encephalopathy who is undergoing an EEG to evaluate for seizures. ? ?Report: This EEG was acquired with electrodes placed according to the International 10-20 electrode system (including Fp1, Fp2, F3, F4, C3, C4, P3, P4, O1, O2, T3, T4, T5, T6, A1, A2, Fz, Cz, Pz). The following electrodes were missing or displaced: none. ? ?The best background was up to 9 Hz although there was intermittent diffuse slowing approximately 5 Hz. This activity is reactive to stimulation. Drowsiness was manifested by background fragmentation; deeper stages of sleep were not identified. There was no focal slowing. There were no interictal epileptiform discharges. There were no electrographic seizures identified. There was no abnormal response to photic stimulation or hyperventilation.  ? ?Impression and clinical correlation: This EEG was obtained while awake and drowsy and is abnormal due to intermittent moderate diffuse slowing indicative of global cerebral dysfunction. Epileptiform abnormalities were not seen during this recording. ? ?Bing Neighbors, MD ?[facility] ?[phone] ? ?If 7pm- 7am, please page neurology on call as listed in AMION. ? ?

## 2021-12-11 NOTE — Assessment & Plan Note (Addendum)
AST and ALT noted to be mildly elevated.  Abdomen is benign.  Ultrasound of the upper right quadrant showed cholelithiasis without any evidence for cholecystitis.  No focal liver lesions were noted.  ?Hepatitis panel was unremarkable.  Check periodically. ?

## 2021-12-11 NOTE — ED Notes (Signed)
This RN called Isa Rankin, pt's daughter to update on POC. ?Pt's daughter states that pt. Has been drooling x1-2 weeks, and garbled speech is new as of yesterday. Suzette states that pt. Has been having frequent vomiting episodes x1-2 weeks. ?

## 2021-12-11 NOTE — ED Notes (Signed)
Pt returned from Mri at this time ?

## 2021-12-11 NOTE — Assessment & Plan Note (Addendum)
Appears to be paroxysmal.  EKG shows sinus rhythm.  On metoprolol and Xarelto prior to admission.  ?These have been resumed. ?

## 2021-12-11 NOTE — ED Notes (Signed)
Dr. Rito Ehrlich notified that pt. Is not a candidate for swallow screen, and repeat potassium is 5.5. ?

## 2021-12-11 NOTE — Hospital Course (Addendum)
71 y.o. female with medical history significant of DM II, HTN, coming from SNF for AMS.  Apparently has been vomiting for the past few days.  Has been drooling.  Patient does have a history of stroke and dementia.  Patient was hospitalized for further evaluation.  Noted to have UTI.  Started on ceftriaxone.  Urine culture positive for yeast.  Diflucan was initiated. ?

## 2021-12-11 NOTE — Assessment & Plan Note (Addendum)
Differential diagnosis includes infection, possible aspiration pneumonia, UA possible UTI.  CT head did not show any acute findings.  MRI brain did not show stroke.  Ultrasound of the abdomen was done due to abnormal LFTs which did not show acute cholecystitis though it did show cholelithiasis. ?Patient was started on antibiotics.  EEG did not show any epileptiform activity.  Urine culture grew yeast.  Started on antifungal.  Mentation has improved in the last 48 hours and she seems to be close to baseline now. ?Waiting on PT evaluation since patient lives in memory care unit and daughter was concerned that they may be unable to take care of her. ?

## 2021-12-11 NOTE — ED Notes (Signed)
Pt. Refusing to wear [place]. ?

## 2021-12-11 NOTE — Evaluation (Signed)
Occupational Therapy Evaluation ?Patient Details ?Name: Leah Ritter ?MRN: [id number] ?DOB: [birth year] ?Today's Date: 12/11/2021 ? ? ?History of Present Illness 71 y.o. female with medical history significant of DM II, HTN, coming from [facility] unit for AMS.  Apparently has been vomiting for the past few days.  ? ?Clinical Impression ?  ?Leah Ritter was seen for OT evaluation this date, daughter at bedside. Prior to hospital admission, pt was MOD I using 4WW at [facility] unit. Pt currently requires MAX A pericare in standing. SBA + RW + cues for BSC t/f. SUPERVISION don/doff gown in standing. MOD A don/doff brief in standing. Per daughter and chart review, pt is near baseline functioning for mobility and ADLs. Upon hospital discharge, recommend no OT follow up, return to prior setting. Will maintain on caseload to prevent deconditioning and encourage participation in ADLs.  ? ?Recommendations for follow up therapy are one component of a multi-disciplinary discharge planning process, led by the attending physician.  Recommendations may be updated based on patient status, additional functional criteria and insurance authorization.  ? ?Follow Up Recommendations ? No OT follow up  ?  ?Assistance Recommended at Discharge Frequent or constant Supervision/Assistance  ?Patient can return home with the following A little help with walking and/or transfers;A lot of help with bathing/dressing/bathroom ? ?  ?Functional Status Assessment ? Patient has not had a recent decline in their functional status  ?Equipment Recommendations ? None recommended by OT  ?  ?Recommendations for Other Services   ? ? ?  ?Precautions / Restrictions Precautions ?Precautions: Fall ?Restrictions ?Weight Bearing Restrictions: No  ? ?  ? ?Mobility Bed Mobility ?Overal bed mobility: Needs Assistance ?Bed Mobility: Supine to Sit ?  ?  ?Supine to sit: Min guard ?  ?  ?  ?  ? ?Transfers ?Overall transfer level: Needs  assistance ?Equipment used: Rolling walker (2 wheels) ?Transfers: Sit to/from Stand ?Sit to Stand: Min guard ?  ?  ?  ?  ?  ?  ?  ? ?  ?Balance Overall balance assessment: Needs assistance ?Sitting-balance support: No upper extremity supported, Feet supported ?Sitting balance-Leahy Scale: Good ?  ?  ?Standing balance support: No upper extremity supported, During functional activity ?Standing balance-Leahy Scale: Good ?  ?  ?  ?  ?  ?  ?  ?  ?  ?  ?  ?  ?   ? ?ADL either performed or assessed with clinical judgement  ? ?ADL Overall ADL's : Needs assistance/impaired ?  ?  ?  ?  ?  ?  ?  ?  ?  ?  ?  ?  ?  ?  ?  ?  ?  ?  ?  ?General ADL Comments: MAX A pericare in standing. SBA + RW + cues for BSC t/f. SUPERVISION don/doff gown in standing. MOD A don/doff brief in standing  ? ? ? ? ?Pertinent Vitals/Pain Pain Assessment ?Pain Assessment: No/denies pain  ? ? ? ?Hand Dominance Right ?  ?Extremity/Trunk Assessment Upper Extremity Assessment ?Upper Extremity Assessment: Overall WFL for tasks assessed ?  ?Lower Extremity Assessment ?Lower Extremity Assessment: Overall WFL for tasks assessed ?  ?  ?  ?Communication Communication ?Communication: Expressive difficulties ?  ?Cognition Arousal/Alertness: Awake/alert ?Behavior During Therapy: Restless, Impulsive ?Overall Cognitive Status: History of cognitive impairments - at baseline ?  ?  ?  ?  ?  ?  ?  ?  ?  ?  ?  ?  ?  ?  ?  ?  ?  ?  ?  ?   ?   ?   ? ? ?  Home Living Family/patient expects to be discharged to:: Assisted living ?  ?  ?  ?  ?  ?  ?  ?  ?  ?  ?  ?  ?  ?  ?Home Equipment: Rollator (4 wheels) ?  ?  ?  ? ?  ?Prior Functioning/Environment Prior Level of Function : Needs assist ?  ?  ?  ?  ?  ?  ?Mobility Comments: 4WW for mobility, no physical assist but cues as needed ?ADLs Comments: assist for ADLs as needed, wears depends ?  ? ?  ?  ?OT Problem List: Decreased safety awareness ?  ?   ?OT Treatment/Interventions: Self-care/ADL training;Therapeutic exercise;Energy  conservation;DME and/or AE instruction;Therapeutic activities;Patient/family education;Balance training  ?  ?OT Goals(Current goals can be found in the care plan section) Acute Rehab OT Goals ?Patient Stated Goal: to go home ?OT Goal Formulation: With patient/family ?Time For Goal Achievement: 12/25/21 ?Potential to Achieve Goals: Good ?ADL Goals ?Pt Will Perform Grooming: with min assist;standing ?Pt Will Perform Lower Body Dressing: with min assist;sit to/from stand ?Pt Will Transfer to Toilet: with supervision;ambulating;regular height toilet  ?OT Frequency: Min 1X/week ?  ? ?Co-evaluation   ?  ?  ?  ?  ? ?  ?AM-PAC OT "6 Clicks" Daily Activity     ?Outcome Measure Help from another person eating meals?: None ?Help from another person taking care of personal grooming?: A Little ?Help from another person toileting, which includes using toliet, bedpan, or urinal?: A Lot ?Help from another person bathing (including washing, rinsing, drying)?: A Little ?Help from another person to put on and taking off regular upper body clothing?: A Little ?Help from another person to put on and taking off regular lower body clothing?: A Lot ?6 Click Score: 17 ?  ?End of Session Equipment Utilized During Treatment: Rolling walker (2 wheels) ?Nurse Communication: Mobility status ? ?Activity Tolerance: Patient tolerated treatment well ?Patient left: in chair;with call bell/phone within reach;with chair alarm set;with family/visitor present ? ?OT Visit Diagnosis: Other abnormalities of gait and mobility (R26.89)  ?              ?Time: 0160-1093 ?OT Time Calculation (min): 37 min ?Charges:  OT General Charges ?$OT Visit: 1 Visit ?OT Evaluation ?$OT Eval Low Complexity: 1 Low ?OT Treatments ?$Self Care/Home Management : 23-37 mins ? ?Kathie Dike, M.S. OTR/L  ?12/11/21, 1:57 PM  ?ascom [phone] ? ?

## 2021-12-11 NOTE — ED Notes (Signed)
Pt. Oxygen sats dropped to 88/89%, pt. Placed on 2L [place], Dr. Rito Ehrlich notified. ?

## 2021-12-11 NOTE — Progress Notes (Signed)
PT Cancellation Note ? ?Patient Details ?Name: Leah Ritter ?MRN: [id number] ?DOB: 1951-07-09 ? ? ?Cancelled Treatment:    Reason Eval/Treat Not Completed: PT screened, no needs identified, will sign off. Author unable to persuade pt into OOB, standing, AMB, anything really. Pt is talking continuously in a quiet mumble, difficult to discern more than it's tangential nature. Pt not following commands at this attempt. Pt OT pt did perform some mobility in room earlier which is demonstrative of her baseline abilities. No PT services indicated at this time. PT will sign off.  ? ?1:28 PM, 12/11/21 ?Rosamaria Lints, PT, DPT ?Physical Therapist - [place] ?[facility]  ?[phone] (ASCOM)  ? ? ?[facility] ?12/11/2021, 1:26 PM ?

## 2021-12-12 DIAGNOSIS — R7989 Other specified abnormal findings of blood chemistry: Secondary | ICD-10-CM | POA: Diagnosis not present

## 2021-12-12 DIAGNOSIS — N179 Acute kidney failure, unspecified: Secondary | ICD-10-CM | POA: Diagnosis not present

## 2021-12-12 DIAGNOSIS — R7881 Bacteremia: Secondary | ICD-10-CM | POA: Diagnosis not present

## 2021-12-12 DIAGNOSIS — G9341 Metabolic encephalopathy: Secondary | ICD-10-CM | POA: Diagnosis not present

## 2021-12-12 LAB — COMPREHENSIVE METABOLIC PANEL
ALT: 53 U/L — ABNORMAL HIGH (ref 0–44)
AST: 51 U/L — ABNORMAL HIGH (ref 15–41)
Albumin: 3.4 g/dL — ABNORMAL LOW (ref 3.5–5.0)
Alkaline Phosphatase: 115 U/L (ref 38–126)
Anion gap: 8 (ref 5–15)
BUN: 43 mg/dL — ABNORMAL HIGH (ref 8–23)
CO2: 25 mmol/L (ref 22–32)
Calcium: 9.4 mg/dL (ref 8.9–10.3)
Chloride: 104 mmol/L (ref 98–111)
Creatinine, Ser: 1.48 mg/dL — ABNORMAL HIGH (ref 0.44–1.00)
GFR, Estimated: 38 mL/min — ABNORMAL LOW (ref >60)
Glucose, Bld: 84 mg/dL (ref 70–99)
Potassium: 5.1 mmol/L (ref 3.5–5.1)
Sodium: 137 mmol/L (ref 135–145)
Total Bilirubin: 0.7 mg/dL (ref 0.3–1.2)
Total Protein: 7.6 g/dL (ref 6.5–8.1)

## 2021-12-12 LAB — CBC
HCT: 33 % — ABNORMAL LOW (ref 36.0–46.0)
Hemoglobin: 10.6 g/dL — ABNORMAL LOW (ref 12.0–15.0)
MCH: 28 pg (ref 26.0–34.0)
MCHC: 32.1 g/dL (ref 30.0–36.0)
MCV: 87.3 fL (ref 80.0–100.0)
Platelets: 170 10*3/uL (ref 150–400)
RBC: 3.78 MIL/uL — ABNORMAL LOW (ref 3.87–5.11)
RDW: 16.6 % — ABNORMAL HIGH (ref 11.5–15.5)
WBC: 6.4 10*3/uL (ref 4.0–10.5)
nRBC: 0 % (ref 0.0–0.2)

## 2021-12-12 LAB — CULTURE, BLOOD (ROUTINE X 2)

## 2021-12-12 LAB — PROCALCITONIN: Procalcitonin: <0.1 ng/mL

## 2021-12-12 LAB — HEPATITIS PANEL, ACUTE
HCV Ab: NONREACTIVE
Hep A IgM: NONREACTIVE
Hep B C IgM: NONREACTIVE
Hepatitis B Surface Ag: NONREACTIVE

## 2021-12-12 LAB — URINE CULTURE: Culture: >=100000 — AB

## 2021-12-12 MED ORDER — MIRABEGRON ER 25 MG PO TB24
25.0000 mg | ORAL_TABLET | Freq: Every day | ORAL | Status: DC
  Administered 2021-12-12 – 2021-12-15 (×4): 25 mg via ORAL
  Filled 2021-12-12 (×4): qty 1

## 2021-12-12 MED ORDER — ATORVASTATIN CALCIUM 20 MG PO TABS
80.0000 mg | ORAL_TABLET | Freq: Every day | ORAL | Status: DC
  Administered 2021-12-12 – 2021-12-14 (×3): 80 mg via ORAL
  Filled 2021-12-12 (×3): qty 4

## 2021-12-12 MED ORDER — QUETIAPINE FUMARATE 25 MG PO TABS
25.0000 mg | ORAL_TABLET | Freq: Every day | ORAL | Status: DC
  Administered 2021-12-12 – 2021-12-14 (×3): 25 mg via ORAL
  Filled 2021-12-12 (×3): qty 1

## 2021-12-12 MED ORDER — METOPROLOL TARTRATE 25 MG PO TABS
12.5000 mg | ORAL_TABLET | Freq: Two times a day (BID) | ORAL | Status: DC
  Administered 2021-12-12: 12.5 mg via ORAL
  Filled 2021-12-12: qty 1

## 2021-12-12 MED ORDER — RIVAROXABAN 20 MG PO TABS
20.0000 mg | ORAL_TABLET | Freq: Every day | ORAL | Status: DC
  Administered 2021-12-12 – 2021-12-15 (×4): 20 mg via ORAL
  Filled 2021-12-12 (×4): qty 1

## 2021-12-12 NOTE — Plan of Care (Signed)
  Problem: Health Behavior/Discharge Planning: Goal: Ability to manage health-related needs will improve Outcome: Progressing   

## 2021-12-12 NOTE — Assessment & Plan Note (Addendum)
Gram-positive cocci noted on blood culture.  Only in 1 set.  Grew out to be Staphylococcus capitis.  Most likely a contaminant.  She remains afebrile with normal WBC.   ?

## 2021-12-12 NOTE — Progress Notes (Signed)
? ?[facility] ?PROGRESS NOTE ? ? ?Leah Ritter [id number] DOB: 05-10-1951 DOA: 12/10/2021  1 ?DOS: the patient was seen and examined on 12/12/2021 ? ?PCP: Lesia Hausen, MD ? ?Brief History and Hospital Course:  ?71 y.o. female with medical history significant of DM II, HTN, coming from SNF for AMS.  Apparently has been vomiting for the past few days.  Has been drooling.  Patient does have a history of stroke.  Unclear if she has any history of cognitive impairment.  Patient was hospitalized for further evaluation. ? ?Consultants: None ? ?Procedures: None yet ? ? ? ?Subjective: ?Remains confused.  Sometimes she answers appropriately at other times she keeps talks incomprehensibly. ? ? ? ?Assessment/Plan: ? ? ?* Acute metabolic encephalopathy ?Differential diagnosis includes infection, possible aspiration pneumonia, UA possible UTI.  CT head did not show any acute findings.  MRI brain did not show stroke.  Ultrasound of the abdomen was done due to abnormal LFTs which did not show acute cholecystitis though it did show cholelithiasis.   ?Patient was started on ceftriaxone.  Seen by speech therapy.  No significant improvement in mentation and speech appears to be a little bit more clear today.  Discussed with daughter yesterday.  She does mention that patient has a history of dementia. ?EEG did not show any epileptiform activity. ?Reorient daily.  Continue to monitor. ? ?Bacteremia ?Gram-positive cocci noted on blood culture.  Only in 1 set.  Likely contaminant.  No need to change antibiotics.  Wait for final identification ? ?Hyperkalemia ?Noted to be severely hyperkalemic with potassium level of 7.  Etiology could be due to medications.  She was noted to be on Aldactone.  Dehydration and acute kidney injury also contributing.  She was given dextrose insulin and calcium.  Also given Kayexalate and Lokelma yesterday. ?Potassium is down to 5.1 today.  We will recheck tomorrow. ? ?AKI (acute kidney injury)  ([place]) ?Creatinine was 1.02 in October 2022.  Presented with creatinine of 1.76.  Likely dehydrated from nausea and vomiting and poor oral intake.   ?Started on IV fluids.  Renal function is improving.  Avoid nephrotoxic agents.  Diuretics on hold.   ? ?UTI (urinary tract infection) ?UA noted to be abnormal.  Patient unable to tell if she was experiencing any symptoms.  Patient placed on ceftriaxone.  Follow-up on urine culture.   ? ?HYPERTENSION, BENIGN SYSTEMIC ?Antihypertensives were held for low blood pressures.  Stable for the most part.   ?Her antihypertensives have been held including Aldactone, metoprolol, furosemide and amlodipine.   ?Continue to monitor. ? ?Chronic diastolic CHF (congestive heart failure) ([place]) ?Continue to hold diuretics for now due to hypovolemia. ? ?A-fib ([place]) ?Appears to be paroxysmal.  EKG shows sinus rhythm.  On metoprolol and Xarelto prior to admission.  Medications were held yesterday as her swallowing function had not been done.  She has been cleared for oral intake.  Will place her back on Xarelto.  Blood pressure remains low at times.  Will initiate low-dose beta-blocker. ? ?Type 2 diabetes mellitus without complication, with long-term current use of insulin ([place]) ?Glipizide is on hold.  HbA1c is pending.  It was 10.5 in August 2022.  Monitor CBGs. ? ?Anemia ?Hemoglobin stable for the most part.  No evidence of overt bleeding. ? ?History of stroke ?Patient has a history of stroke and atrial fibrillation. ?Patient on Xarelto for stroke prevention.  MRI was done during this admission which does not show any acute stroke. ? ?GERD (gastroesophageal reflux  disease) ?Aspiration precautions.  PPI. ? ? ?Sleep apnea ?CPAP per home settings.   ? ?Abnormal LFTs ?AST and ALT noted to be mildly elevated.  Abdomen is benign.  Ultrasound of the upper right quadrant showed cholelithiasis without any evidence for cholecystitis.  No focal liver lesions were noted.  LFTs are stable.  Do not  anticipate any further work-up in the hospital.  Hepatitis panel is pending. ? ?Hypothermia ?Patient initially was hypothermic and warming blanket. ? ? ?Obesity ?Estimated body mass index is 30.37 kg/m? as calculated from the following: ?  Height as of this encounter: 5\' 10"  (1.778 m). ?  Weight as of this encounter: 96 kg. ? ? ?DVT Prophylaxis: Subcutaneous heparin ?Code Status: Full code ?Family Communication: Daughter was updated yesterday.  We will call her again today. ?Disposition Plan: Back to SNF when improved ? ?Status is: Inpatient ?Remains inpatient appropriate because: Acute kidney injury with hyperkalemia, altered mental status ? ? ? ? ?Medications: Scheduled: ? heparin  5,000 Units Subcutaneous Q8H  ? pantoprazole (PROTONIX) IV  40 mg Intravenous Q24H  ? ?Continuous: ? sodium chloride 50 mL/hr at 12/11/21 2234  ? cefTRIAXone (ROCEPHIN)  IV 1 g (12/11/21 1337)  ? ?PRN: ? ?Antibiotics: ?Anti-infectives (From admission, onward)  ? ? Start     Dose/Rate Route Frequency Ordered Stop  ? 12/11/21 1000  cefTRIAXone (ROCEPHIN) 1 g in sodium chloride 0.9 % 100 mL IVPB       ? 1 g ?200 mL/hr over 30 Minutes Intravenous Every 24 hours 12/11/21 0945    ? 12/10/21 1945  cefTRIAXone (ROCEPHIN) 2 g in sodium chloride 0.9 % 100 mL IVPB  Status:  Discontinued       ? 2 g ?200 mL/hr over 30 Minutes Intravenous  Once 12/10/21 1944 12/10/21 2022  ? ?  ? ? ?Objective: ? ?Vital Signs ? ?Vitals:  ? 12/11/21 1659 12/11/21 2039 12/12/21 0317 12/12/21 0920  ?BP: 136/67 (!) 97/51 (!) 108/45 (!) 110/27  ?Pulse: 96 96 79 81  ?Resp: 16 18 20 16   ?Temp: (!) 97.5 ?F (36.4 ?C) 97.8 ?F (36.6 ?C) 98 ?F (36.7 ?C) 97.7 ?F (36.5 ?C)  ?TempSrc: Oral  Oral   ?SpO2: 100% 100% 98% 100%  ?Weight:      ?Height:      ? ? ?Intake/Output Summary (Last 24 hours) at 12/12/2021 0954 ?Last data filed at 12/12/2021 0356 ?Gross per 24 hour  ?Intake 1725.72 ml  ?Output --  ?Net 1725.72 ml  ? ?Filed Weights  ? 12/10/21 1651  ?Weight: 96 kg  ? ? ?General  appearance: Awake alert.  In no distress.  Pleasantly confused ?Resp: Clear to auscultation bilaterally.  Normal effort ?Cardio: S1-S2 is normal regular.  No S3-S4.  No rubs murmurs or bruit ?GI: Abdomen is soft.  Nontender nondistended.  Bowel sounds are present normal.  No masses organomegaly ?Extremities: No edema.  Moving all of her extremities ?Neurologic:  No focal neurological deficits.  ? ? ?Lab Results: ? ?Data Reviewed: I have personally reviewed labs and imaging study reports ? ?CBC: ?Recent Labs  ?Lab 12/10/21 ?1654 12/11/21 ?0003 12/11/21 ?[id number] 12/12/21 ?0604  ?WBC 9.6 8.1 6.8 6.4  ?HGB 11.5* 11.5* 10.6* 10.6*  ?HCT 37.1 36.5 33.9* 33.0*  ?MCV 89.0 88.8 89.2 87.3  ?PLT 126* 201 196 170  ? ? ?Basic Metabolic Panel: ?Recent Labs  ?Lab 12/10/21 ?1912 12/10/21 ?2257 12/11/21 ?0000 12/11/21 ?0754 12/11/21 ?1740 12/12/21 ?0604  ?NA 135 136  --  138  --  137  ?K 7.1* 6.4*  --  5.5* 5.5* 5.1  ?CL 102 105  --  106  --  104  ?CO2 24 23  --  22  --  25  ?GLUCOSE 120* 125*  --  102*  --  84  ?BUN 53* 55*  --  51*  --  43*  ?CREATININE 1.76* 1.69*  --  1.56*  --  1.48*  ?CALCIUM 10.0 9.8  --  9.5  --  9.4  ?MG  --   --  2.1  --   --   --   ? ? ?GFR: ?Estimated Creatinine Clearance: 43.8 mL/min (A) (by C-G formula based on SCr of 1.48 mg/dL (H)). ? ?Liver Function Tests: ?Recent Labs  ?Lab 12/10/21 ?1912 12/12/21 ?0604  ?AST 55* 51*  ?ALT 68* 53*  ?ALKPHOS 135* 115  ?BILITOT 0.4 0.7  ?PROT 8.4* 7.6  ?ALBUMIN 3.6 3.4*  ? ? ? ?Coagulation Profile: ?Recent Labs  ?Lab 12/10/21 ?1951  ?INR 1.4*  ? ? ?Cardiac Enzymes: ?Recent Labs  ?Lab 12/11/21 ?[id number]  ?CKTOTAL 786*  ? ? ? ?Recent Results (from the past 240 hour(s))  ?Culture, blood (routine x 2)     Status: None (Preliminary result)  ? Collection Time: 12/10/21  7:12 PM  ? Specimen: BLOOD  ?Result Value Ref Range Status  ? Specimen Description   Final  ?  BLOOD LEFT ASSIST CONTROL ?Performed at [facility], [address] [place], [place] [address] ?  ?  Special Requests   Final  ?  BOTTLES DRAWN AEROBIC AND ANAEROBIC Blood Culture results may not be optimal due to an inadequate volume of blood received in culture bottles ?Performed at [facility], [place]

## 2021-12-13 DIAGNOSIS — N3000 Acute cystitis without hematuria: Secondary | ICD-10-CM | POA: Diagnosis not present

## 2021-12-13 DIAGNOSIS — G9341 Metabolic encephalopathy: Secondary | ICD-10-CM | POA: Diagnosis not present

## 2021-12-13 LAB — COMPREHENSIVE METABOLIC PANEL
ALT: 41 U/L (ref 0–44)
AST: 37 U/L (ref 15–41)
Albumin: 3 g/dL — ABNORMAL LOW (ref 3.5–5.0)
Alkaline Phosphatase: 103 U/L (ref 38–126)
Anion gap: 8 (ref 5–15)
BUN: 27 mg/dL — ABNORMAL HIGH (ref 8–23)
CO2: 21 mmol/L — ABNORMAL LOW (ref 22–32)
Calcium: 9.1 mg/dL (ref 8.9–10.3)
Chloride: 106 mmol/L (ref 98–111)
Creatinine, Ser: 1.23 mg/dL — ABNORMAL HIGH (ref 0.44–1.00)
GFR, Estimated: 47 mL/min — ABNORMAL LOW (ref >60)
Glucose, Bld: 77 mg/dL (ref 70–99)
Potassium: 5 mmol/L (ref 3.5–5.1)
Sodium: 135 mmol/L (ref 135–145)
Total Bilirubin: 0.7 mg/dL (ref 0.3–1.2)
Total Protein: 6.9 g/dL (ref 6.5–8.1)

## 2021-12-13 LAB — GLUCOSE, CAPILLARY
Glucose-Capillary: 76 mg/dL (ref 70–99)
Glucose-Capillary: 65 mg/dL — ABNORMAL LOW (ref 70–99)
Glucose-Capillary: 114 mg/dL — ABNORMAL HIGH (ref 70–99)

## 2021-12-13 LAB — CBC
HCT: 30.2 % — ABNORMAL LOW (ref 36.0–46.0)
Hemoglobin: 9.7 g/dL — ABNORMAL LOW (ref 12.0–15.0)
MCH: 27.8 pg (ref 26.0–34.0)
MCHC: 32.1 g/dL (ref 30.0–36.0)
MCV: 86.5 fL (ref 80.0–100.0)
Platelets: 159 10*3/uL (ref 150–400)
RBC: 3.49 MIL/uL — ABNORMAL LOW (ref 3.87–5.11)
RDW: 16.5 % — ABNORMAL HIGH (ref 11.5–15.5)
WBC: 5.8 10*3/uL (ref 4.0–10.5)
nRBC: 0 % (ref 0.0–0.2)

## 2021-12-13 MED ORDER — DEXTROSE-NACL 5-0.45 % IV SOLN: INTRAVENOUS | Status: DC

## 2021-12-13 MED ORDER — DEXTROSE 50 % IV SOLN
INTRAVENOUS | Status: AC
  Filled 2021-12-13: qty 50

## 2021-12-13 MED ORDER — FLUCONAZOLE IN SODIUM CHLORIDE 200-0.9 MG/100ML-% IV SOLN
200.0000 mg | INTRAVENOUS | Status: DC
  Administered 2021-12-13: 200 mg via INTRAVENOUS
  Filled 2021-12-13 (×2): qty 100

## 2021-12-13 MED ORDER — DEXTROSE 50 % IV SOLN
1.0000 | Freq: Once | INTRAVENOUS | Status: AC
  Administered 2021-12-13: 50 mL via INTRAVENOUS

## 2021-12-13 MED ORDER — METOPROLOL TARTRATE 50 MG PO TABS
50.0000 mg | ORAL_TABLET | Freq: Two times a day (BID) | ORAL | Status: DC
  Administered 2021-12-13 – 2021-12-15 (×5): 50 mg via ORAL
  Filled 2021-12-13 (×5): qty 1

## 2021-12-13 NOTE — Progress Notes (Signed)
? ?[facility] ?PROGRESS NOTE ? ? ?Meta Kroenke FHL:[id number] DOB: October 17, 1950 DOA: 12/10/2021  2 ?DOS: the patient was seen and examined on 12/13/2021 ? ?PCP: Mortimer Fries, MD ? ?Brief History and Hospital Course:  ?71 y.o. female with medical history significant of DM II, HTN, coming from SNF for AMS.  Apparently has been vomiting for the past few days.  Has been drooling.  Patient does have a history of stroke and dementia.  Patient was hospitalized for further evaluation. ? ?Consultants: None ? ?Procedures: None yet ? ? ? ?Subjective: ?Patient is sleepy this morning.  Following commands but does not answer any questions.   ? ?Assessment/Plan: ? ? ?* Acute metabolic encephalopathy ?Differential diagnosis includes infection, possible aspiration pneumonia, UA possible UTI.  CT head did not show any acute findings.  MRI brain did not show stroke.  Ultrasound of the abdomen was done due to abnormal LFTs which did not show acute cholecystitis though it did show cholelithiasis.   ?Patient was started on ceftriaxone.  Seen by speech therapy.   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 ?EEG did not show any epileptiform activity. ?Mentation has not improved much in the last 48 hours.  Continue to monitor for now.   ? ?Bacteremia ?Gram-positive cocci noted on blood culture.  Only in 1 set.  Grew out to be Staphylococcus capitis.  Most likely a contaminant.  She remains afebrile with normal WBC.   ? ?Hyperkalemia ?At the time of admission she was noted to be severely hyperkalemic with potassium level of 7.  Etiology could be due to medications.  She was noted to be on Aldactone.  Dehydration and acute kidney injury also contributing.  She was given dextrose insulin and calcium.  Also given Kayexalate and Lokelma. ?Potassium has improved.  Continue to monitor. ? ?AKI (acute kidney injury) ([facility]) ?Creatinine was 1.02 in October 2022.  Presented with creatinine of 1.76.   Likely dehydrated from nausea and vomiting and poor oral intake.   ?Renal function improved with IV fluids.  Monitor urine output.  Diuretics on hold.  Will leave her on IV fluids till her oral intake improves. ? ?UTI (urinary tract infection) ?UA noted to be abnormal.  Patient unable to tell if she was experiencing any symptoms.  Patient placed on ceftriaxone.  ?Urine culture growing greater than 100,000 colonies of yeast.  Initiate Diflucan.  ? ?HYPERTENSION, BENIGN SYSTEMIC ?Due to low blood pressures her antihypertensives were held including Aldactone, metoprolol, furosemide and amlodipine.   ?Blood pressure in the last 12 hours noted to be creeping upwards.  We will reinitiate her metoprolol. ? ?Chronic diastolic CHF (congestive heart failure) ([facility]) ?Continue to hold diuretics for now. ? ?A-fib ([facility]) ?Appears to be paroxysmal.  EKG shows sinus rhythm.  On metoprolol and Xarelto prior to admission.  Medications were held yesterday as her swallowing function had not been done.  Cleared for oral intake. ?Continue Xarelto.  Resume metoprolol. ? ?Type 2 diabetes mellitus without complication, with long-term current use of insulin ([facility]) ?HbA1c was 10.5 in August 2022.  Glipizide is on hold.  HbA1c is pending. Monitor CBGs. ? ?Anemia ?Hemoglobin stable for the most part.  No evidence for overt bleeding. ? ?History of stroke ?Patient has a history of stroke and atrial fibrillation. ?Patient on Xarelto for stroke prevention.  MRI was done during this admission which does not show any acute stroke. ? ?GERD (gastroesophageal reflux disease) ?Aspiration precautions.  PPI. ? ? ?  Sleep apnea ?CPAP per home settings.   ? ?Abnormal LFTs ?AST and ALT noted to be mildly elevated.  Abdomen is benign.  Ultrasound of the upper right quadrant showed cholelithiasis without any evidence for cholecystitis.  No focal liver lesions were noted.  ?Hepatitis panel was unremarkable.  LFTs have improved.  ?No further work-up at this  time ? ?Hypothermia ?Patient initially was hypothermic and warming blanket. ? ? ?Obesity ?Estimated body mass index is 30.37 kg/m? as calculated from the following: ?  Height as of this encounter: 5\' 10"  (1.778 m). ?  Weight as of this encounter: 96 kg. ? ? ?DVT Prophylaxis: Subcutaneous heparin ?Code Status: Full code ?Family Communication: Daughter to be updated later today ?Disposition Plan: Back to SNF when improved ? ?Status is: Inpatient ?Remains inpatient appropriate because: Acute kidney injury with hyperkalemia, altered mental status ? ? ? ? ?Medications: Scheduled: ? atorvastatin  80 mg Oral q1800  ? metoprolol tartrate  12.5 mg Oral BID  ? mirabegron ER  25 mg Oral Daily  ? pantoprazole (PROTONIX) IV  40 mg Intravenous Q24H  ? QUEtiapine  25 mg Oral QHS  ? rivaroxaban  20 mg Oral Daily  ? ?Continuous: ? sodium chloride 50 mL/hr at 12/12/21 1834  ? cefTRIAXone (ROCEPHIN)  IV 1 g (12/12/21 1040)  ? ?PRN: ? ?Antibiotics: ?Anti-infectives (From admission, onward)  ? ? Start     Dose/Rate Route Frequency Ordered Stop  ? 12/11/21 1000  cefTRIAXone (ROCEPHIN) 1 g in sodium chloride 0.9 % 100 mL IVPB       ? 1 g ?200 mL/hr over 30 Minutes Intravenous Every 24 hours 12/11/21 0945    ? 12/10/21 1945  cefTRIAXone (ROCEPHIN) 2 g in sodium chloride 0.9 % 100 mL IVPB  Status:  Discontinued       ? 2 g ?200 mL/hr over 30 Minutes Intravenous  Once 12/10/21 1944 12/10/21 2022  ? ?  ? ? ?Objective: ? ?Vital Signs ? ?Vitals:  ? 12/12/21 1655 12/12/21 2029 12/13/21 0429 12/13/21 0731  ?BP: (!) 103/32 (!) 155/112 (!) 147/82 (!) 148/86  ?Pulse: 73 88 95 95  ?Resp: 17 18 19 20   ?Temp: 98.2 ?F (36.8 ?C) 97.8 ?F (36.6 ?C) 98 ?F (36.7 ?C) 98 ?F (36.7 ?C)  ?TempSrc:   Axillary   ?SpO2: 100% 100% 100% 98%  ?Weight:      ?Height:      ? ? ?Intake/Output Summary (Last 24 hours) at 12/13/2021 0911 ?Last data filed at 12/12/2021 1503 ?Gross per 24 hour  ?Intake 630.74 ml  ?Output --  ?Net 630.74 ml  ? ? ?Filed Weights  ? 12/10/21 1651   ?Weight: 96 kg  ? ? ?General appearance: Somnolent.  No distress. ?Resp: Clear to auscultation bilaterally.  Normal effort ?Cardio: S1-S2 is normal regular.  No S3-S4.  No rubs murmurs or bruit ?GI: Abdomen is soft.  Nontender nondistended.  Bowel sounds are present normal.  No masses organomegaly ?Extremities: No edema.   ?Neurologic: No focal neurological deficits.  ? ? ? ?Lab Results: ? ?Data Reviewed: I have personally reviewed labs and imaging study reports ? ?CBC: ?Recent Labs  ?Lab 12/10/21 ?1654 12/11/21 ?0003 12/11/21 ?0754 12/12/21 ?0604 12/13/21 ?0745  ?WBC 9.6 8.1 6.8 6.4 5.8  ?HGB 11.5* 11.5* 10.6* 10.6* 9.7*  ?HCT 37.1 36.5 33.9* 33.0* 30.2*  ?MCV 89.0 88.8 89.2 87.3 86.5  ?PLT 126* 201 196 170 159  ? ? ? ?Basic Metabolic Panel: ?Recent Labs  ?Lab 12/10/21 ?1912 12/10/21 ?  2257 12/11/21 ?0000 12/11/21 ?0754 12/11/21 ?1740 12/12/21 ?0604 12/13/21 ?0745  ?NA 135 136  --  138  --  137 135  ?K 7.1* 6.4*  --  5.5* 5.5* 5.1 5.0  ?CL 102 105  --  106  --  104 106  ?CO2 24 23  --  22  --  25 21*  ?GLUCOSE 120* 125*  --  102*  --  84 77  ?BUN 53* 55*  --  51*  --  43* 27*  ?CREATININE 1.76* 1.69*  --  1.56*  --  1.48* 1.23*  ?CALCIUM 10.0 9.8  --  9.5  --  9.4 9.1  ?MG  --   --  2.1  --   --   --   --   ? ? ? ?GFR: ?Estimated Creatinine Clearance: 52.6 mL/min (A) (by C-G formula based on SCr of 1.23 mg/dL (H)). ? ?Liver Function Tests: ?Recent Labs  ?Lab 12/10/21 ?1912 12/12/21 ?0604 12/13/21 ?0745  ?AST 55* 51* 37  ?ALT 68* 53* 41  ?ALKPHOS 135* 115 103  ?BILITOT 0.4 0.7 0.7  ?PROT 8.4* 7.6 6.9  ?ALBUMIN 3.6 3.4* 3.0*  ? ? ? ? ?Coagulation Profile: ?Recent Labs  ?Lab 12/10/21 ?1951  ?INR 1.4*  ? ? ? ?Cardiac Enzymes: ?Recent Labs  ?Lab 12/11/21 ?0754  ?CKTOTAL 786*  ? ? ? ? ?Recent Results (from the past 240 hour(s))  ?Urine Culture     Status: Abnormal  ? Collection Time: 12/10/21  4:54 PM  ? Specimen: Urine, Random  ?Result Value Ref Range Status  ? Specimen Description   Final  ?  URINE, RANDOM ?Performed  at [facility], [address] [place], [place] [address] ?  ? Special Requests   Final  ?  NONE ?Performed at [facility], [address] [place], [place] [address] ?  ? Culture >=100,000

## 2021-12-14 DIAGNOSIS — N3000 Acute cystitis without hematuria: Secondary | ICD-10-CM | POA: Diagnosis not present

## 2021-12-14 DIAGNOSIS — G9341 Metabolic encephalopathy: Secondary | ICD-10-CM | POA: Diagnosis not present

## 2021-12-14 LAB — BASIC METABOLIC PANEL
Anion gap: 4 — ABNORMAL LOW (ref 5–15)
BUN: 22 mg/dL (ref 8–23)
CO2: 24 mmol/L (ref 22–32)
Calcium: 8.9 mg/dL (ref 8.9–10.3)
Chloride: 108 mmol/L (ref 98–111)
Creatinine, Ser: 1.25 mg/dL — ABNORMAL HIGH (ref 0.44–1.00)
GFR, Estimated: 46 mL/min — ABNORMAL LOW (ref >60)
Glucose, Bld: 142 mg/dL — ABNORMAL HIGH (ref 70–99)
Potassium: 4.6 mmol/L (ref 3.5–5.1)
Sodium: 136 mmol/L (ref 135–145)

## 2021-12-14 LAB — CBC
HCT: 31.8 % — ABNORMAL LOW (ref 36.0–46.0)
Hemoglobin: 10.2 g/dL — ABNORMAL LOW (ref 12.0–15.0)
MCH: 28.4 pg (ref 26.0–34.0)
MCHC: 32.1 g/dL (ref 30.0–36.0)
MCV: 88.6 fL (ref 80.0–100.0)
Platelets: 159 10*3/uL (ref 150–400)
RBC: 3.59 MIL/uL — ABNORMAL LOW (ref 3.87–5.11)
RDW: 16.5 % — ABNORMAL HIGH (ref 11.5–15.5)
WBC: 6.3 10*3/uL (ref 4.0–10.5)
nRBC: 0 % (ref 0.0–0.2)

## 2021-12-14 LAB — HEMOGLOBIN A1C
Hgb A1c MFr Bld: 9.7 % — ABNORMAL HIGH (ref 4.8–5.6)
Mean Plasma Glucose: 232 mg/dL

## 2021-12-14 LAB — GLUCOSE, CAPILLARY: Glucose-Capillary: 147 mg/dL — ABNORMAL HIGH (ref 70–99)

## 2021-12-14 MED ORDER — FLUCONAZOLE 100 MG PO TABS
200.0000 mg | ORAL_TABLET | Freq: Every day | ORAL | Status: DC
  Administered 2021-12-14 – 2021-12-15 (×2): 200 mg via ORAL
  Filled 2021-12-14 (×2): qty 2

## 2021-12-14 MED ORDER — PANTOPRAZOLE SODIUM 40 MG PO TBEC
40.0000 mg | DELAYED_RELEASE_TABLET | Freq: Every evening | ORAL | Status: DC
  Administered 2021-12-14: 40 mg via ORAL
  Filled 2021-12-14: qty 1

## 2021-12-14 NOTE — Progress Notes (Signed)
? ?[facility] ?PROGRESS NOTE ? ? ?Samiyyah Reefer [id number] DOB: 1950/10/14 DOA: 12/10/2021  3 ?DOS: the patient was seen and examined on 12/14/2021 ? ?PCP: Lesia Hausen, MD ? ?Brief History and Hospital Course:  ?71 y.o. female with medical history significant of DM II, HTN, coming from SNF for AMS.  Apparently has been vomiting for the past few days.  Has been drooling.  Patient does have a history of stroke and dementia.  Patient was hospitalized for further evaluation.  Noted to have UTI.  Started on ceftriaxone.  Urine culture positive for yeast.  Diflucan was initiated. ? ?Consultants: None ? ?Procedures: None yet ? ? ? ?Subjective: ?Patient is awake alert.  Confused.  Answers a few questions but most of the time unable to understand what she is trying to say. ? ? ?Assessment/Plan: ? ? ?* Acute metabolic encephalopathy ?Differential diagnosis includes infection, possible aspiration pneumonia, UA possible UTI.  CT head did not show any acute findings.  MRI brain did not show stroke.  Ultrasound of the abdomen was done due to abnormal LFTs which did not show acute cholecystitis though it did show cholelithiasis.   ?Patient was started on ceftriaxone.  Seen by speech therapy.   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 ?EEG did not show any epileptiform activity. ?Mentation seems to be getting better.  Encourage oral intake.  Reorient daily. ? ?Bacteremia ?Gram-positive cocci noted on blood culture.  Only in 1 set.  Grew out to be Staphylococcus capitis.  Most likely a contaminant.  She remains afebrile with normal WBC.   ? ?Hyperkalemia ?At the time of admission she was noted to be severely hyperkalemic with potassium level of 7.  Etiology could be due to medications.  She was noted to be on Aldactone.  Dehydration and acute kidney injury also contributing.  She was given dextrose insulin and calcium.  Also given Kayexalate and Lokelma. ?Potassium has  improved.  Continue to monitor. ? ?AKI (acute kidney injury) ([place]) ?Creatinine was 1.02 in October 2022.  Presented with creatinine of 1.76.  Likely dehydrated from nausea and vomiting and poor oral intake.   ?Renal function improved with IV fluids.  Monitor urine output.  Diuretics on hold.  Will leave her on IV fluids till her oral intake improves. ? ?UTI (urinary tract infection) ?UA noted to be abnormal.  Patient unable to tell if she was experiencing any symptoms.  Patient placed on ceftriaxone.  ?Urine culture growing greater than 100,000 colonies of yeast.  Started on Diflucan.  No bacteria identified on UA.  We will stop ceftriaxone after today's dose.  ? ?HYPERTENSION, BENIGN SYSTEMIC ?Due to low blood pressures her antihypertensives were held including Aldactone, metoprolol, furosemide and amlodipine.   ?Uptrending blood pressures were noted yesterday.  Metoprolol was reintroduced.  Continue to monitor. ? ?Chronic diastolic CHF (congestive heart failure) ([place]) ?Continue to hold diuretics for now. ? ?A-fib ([place]) ?Appears to be paroxysmal.  EKG shows sinus rhythm.  On metoprolol and Xarelto prior to admission.  ?These have been resumed. ? ?Type 2 diabetes mellitus without complication, with long-term current use of insulin ([place]) ?HbA1c was 10.5 in August 2022.  Glipizide is on hold.  HbA1c 9.7.  Noted to have hypoglycemia yesterday due to poor oral intake.  Requiring D5 infusion.  Mentation is better.  Encourage oral intake.  Assist with feeds.  Monitor CBGs twice a day. ? ?Anemia ?Hemoglobin stable for the most part.  No evidence  for overt bleeding. ? ?History of stroke ?Patient has a history of stroke and atrial fibrillation. ?Patient on Xarelto for stroke prevention.  MRI was done during this admission which does not show any acute stroke. ? ?GERD (gastroesophageal reflux disease) ?Aspiration precautions.  PPI. ? ? ?Sleep apnea ?CPAP per home settings.   ? ?Abnormal LFTs ?AST and ALT noted to be mildly  elevated.  Abdomen is benign.  Ultrasound of the upper right quadrant showed cholelithiasis without any evidence for cholecystitis.  No focal liver lesions were noted.  ?Hepatitis panel was unremarkable.  LFTs have improved.  ?No further work-up at this time ? ?Hypothermia ?Patient initially was hypothermic and warming blanket. ? ? ?Obesity ?Estimated body mass index is 30.37 kg/m? as calculated from the following: ?  Height as of this encounter: 5\' 10"  (1.778 m). ?  Weight as of this encounter: 96 kg. ? ? ?DVT Prophylaxis: Subcutaneous heparin ?Code Status: Full code ?Family Communication: Daughter being updated daily ?Disposition Plan: Back to SNF when improved.  Anticipate discharge tomorrow. ? ?Status is: Inpatient ?Remains inpatient appropriate because: Acute kidney injury with hyperkalemia, altered mental status ? ? ? ? ?Medications: Scheduled: ? atorvastatin  80 mg Oral q1800  ? metoprolol tartrate  50 mg Oral BID  ? mirabegron ER  25 mg Oral Daily  ? pantoprazole (PROTONIX) IV  40 mg Intravenous Q24H  ? QUEtiapine  25 mg Oral QHS  ? rivaroxaban  20 mg Oral Daily  ? ?Continuous: ? cefTRIAXone (ROCEPHIN)  IV 1 g (12/13/21 1138)  ? dextrose 5 % and 0.45% NaCl Stopped (12/14/21 0655)  ? fluconazole (DIFLUCAN) IV 200 mg (12/13/21 1231)  ? ?PRN: ? ?Antibiotics: ?Anti-infectives (From admission, onward)  ? ? Start     Dose/Rate Route Frequency Ordered Stop  ? 12/13/21 1100  fluconazole (DIFLUCAN) IVPB 200 mg       ? 200 mg ?100 mL/hr over 60 Minutes Intravenous Every 24 hours 12/13/21 0913    ? 12/11/21 1000  cefTRIAXone (ROCEPHIN) 1 g in sodium chloride 0.9 % 100 mL IVPB       ? 1 g ?200 mL/hr over 30 Minutes Intravenous Every 24 hours 12/11/21 0945    ? 12/10/21 1945  cefTRIAXone (ROCEPHIN) 2 g in sodium chloride 0.9 % 100 mL IVPB  Status:  Discontinued       ? 2 g ?200 mL/hr over 30 Minutes Intravenous  Once 12/10/21 1944 12/10/21 2022  ? ?  ? ? ?Objective: ? ?Vital Signs ? ?Vitals:  ? 12/13/21 1516 12/13/21  2033 12/14/21 0605 12/14/21 0731  ?BP: 134/78 123/60 (!) 150/76 121/85  ?Pulse: 77 71 77 82  ?Resp:  16 18 17   ?Temp: 97.7 ?F (36.5 ?C) 98 ?F (36.7 ?C) (!) 97.3 ?F (36.3 ?C)   ?TempSrc: Axillary Oral Axillary   ?SpO2: 100% 96% 100% 100%  ?Weight:      ?Height:      ? ? ?Intake/Output Summary (Last 24 hours) at 12/14/2021 1022 ?Last data filed at 12/13/2021 1900 ?Gross per 24 hour  ?Intake 1413.84 ml  ?Output 650 ml  ?Net 763.84 ml  ? ? ?Filed Weights  ? 12/10/21 1651  ?Weight: 96 kg  ? ? ?General appearance: Awake alert.  In no distress.  Distracted ?Resp: Clear to auscultation bilaterally.  Normal effort ?Cardio: S1-S2 is normal regular.  No S3-S4.  No rubs murmurs or bruit ?GI: Abdomen is soft.  Nontender nondistended.  Bowel sounds are present normal.  No masses organomegaly ?Extremities:  No edema.  Full range of motion of lower extremities. ?Neurologic:  No focal neurological deficits.  ? ? ? ? ?Lab Results: ? ?Data Reviewed: I have personally reviewed labs and imaging study reports ? ?CBC: ?Recent Labs  ?Lab 12/11/21 ?0003 12/11/21 ?[id number] 12/12/21 ?0604 12/13/21 ?0745 12/14/21 ?[id number]  ?WBC 8.1 6.8 6.4 5.8 6.3  ?HGB 11.5* 10.6* 10.6* 9.7* 10.2*  ?HCT 36.5 33.9* 33.0* 30.2* 31.8*  ?MCV 88.8 89.2 87.3 86.5 88.6  ?PLT 201 196 170 159 159  ? ? ? ?Basic Metabolic Panel: ?Recent Labs  ?Lab 12/10/21 ?2257 12/11/21 ?0000 12/11/21 ?0754 12/11/21 ?1740 12/12/21 ?0604 12/13/21 ?0745 12/14/21 ?[id number]  ?NA 136  --  138  --  137 135 136  ?K 6.4*  --  5.5* 5.5* 5.1 5.0 4.6  ?CL 105  --  106  --  104 106 108  ?CO2 23  --  22  --  25 21* 24  ?GLUCOSE 125*  --  102*  --  84 77 142*  ?BUN 55*  --  51*  --  43* 27* 22  ?CREATININE 1.69*  --  1.56*  --  1.48* 1.23* 1.25*  ?CALCIUM 9.8  --  9.5  --  9.4 9.1 8.9  ?MG  --  2.1  --   --   --   --   --   ? ? ? ?GFR: ?Estimated Creatinine Clearance: 51.8 mL/min (A) (by C-G formula based on SCr of 1.25 mg/dL (H)). ? ?Liver Function Tests: ?Recent Labs  ?Lab 12/10/21 ?1912 12/12/21 ?0604  12/13/21 ?0745  ?AST 55* 51* 37  ?ALT 68* 53* 41  ?ALKPHOS 135* 115 103  ?BILITOT 0.4 0.7 0.7  ?PROT 8.4* 7.6 6.9  ?ALBUMIN 3.6 3.4* 3.0*  ? ? ? ? ?Coagulation Profile: ?Recent Labs  ?Lab 12/10/21 ?1951  ?INR 1.4*

## 2021-12-15 DIAGNOSIS — N814 Uterovaginal prolapse, unspecified: Secondary | ICD-10-CM | POA: Diagnosis present

## 2021-12-15 DIAGNOSIS — N3000 Acute cystitis without hematuria: Secondary | ICD-10-CM | POA: Diagnosis not present

## 2021-12-15 LAB — COMPREHENSIVE METABOLIC PANEL
ALT: 49 U/L — ABNORMAL HIGH (ref 0–44)
AST: 47 U/L — ABNORMAL HIGH (ref 15–41)
Albumin: 3.4 g/dL — ABNORMAL LOW (ref 3.5–5.0)
Alkaline Phosphatase: 106 U/L (ref 38–126)
Anion gap: 6 (ref 5–15)
BUN: 18 mg/dL (ref 8–23)
CO2: 27 mmol/L (ref 22–32)
Calcium: 9.1 mg/dL (ref 8.9–10.3)
Chloride: 104 mmol/L (ref 98–111)
Creatinine, Ser: 1.26 mg/dL — ABNORMAL HIGH (ref 0.44–1.00)
GFR, Estimated: 46 mL/min — ABNORMAL LOW (ref >60)
Glucose, Bld: 132 mg/dL — ABNORMAL HIGH (ref 70–99)
Potassium: 4.4 mmol/L (ref 3.5–5.1)
Sodium: 137 mmol/L (ref 135–145)
Total Bilirubin: 0.5 mg/dL (ref 0.3–1.2)
Total Protein: 7.6 g/dL (ref 6.5–8.1)

## 2021-12-15 LAB — GLUCOSE, CAPILLARY
Glucose-Capillary: 116 mg/dL — ABNORMAL HIGH (ref 70–99)
Glucose-Capillary: 137 mg/dL — ABNORMAL HIGH (ref 70–99)

## 2021-12-15 LAB — CULTURE, BLOOD (ROUTINE X 2): Culture: NO GROWTH

## 2021-12-15 MED ORDER — FLUCONAZOLE 200 MG PO TABS
200.0000 mg | ORAL_TABLET | Freq: Every day | ORAL | 0 refills | Status: AC
Start: 2021-12-15 — End: 2021-12-20

## 2021-12-15 MED ORDER — ALPRAZOLAM 0.25 MG PO TABS: 0.2500 mg | ORAL_TABLET | Freq: Two times a day (BID) | ORAL | 0 refills | Status: DC | PRN

## 2021-12-15 NOTE — TOC Progression Note (Signed)
Transition of Care (TOC) - Progression Note  ? ? ?Patient Details  ?Name: Leah Ritter ?MRN: [id number] ?Date of Birth: [birth year] ? ?Transition of Care (TOC) CM/SW Contact  ?Danyella Mcginty A Krystal Teachey, LCSW ?Phone Number: ?12/15/2021, 11:38 AM ? ?Clinical Narrative:   Spoke with [facility] and they are prepared to take pt back today. FL2 and DC summary sent. Pt's daughter will transport back. ? ? ? ?Expected Discharge Plan: Memory Care ?Barriers to Discharge: Continued Medical Work up ? ?Expected Discharge Plan and Services ?Expected Discharge Plan: Memory Care ?In-house Referral: Clinical Social Work ?  ?Post Acute Care Choice: NA ?Living arrangements for the past 2 months: Single Family Home ?                ?  ?  ?  ?  ?  ?  ?  ?  ?  ?  ? ? ?Social Determinants of Health (SDOH) Interventions ?  ? ?Readmission Risk Interventions ?No flowsheet data found. ? ?

## 2021-12-15 NOTE — Plan of Care (Signed)
?  Problem: Education: ?Goal: Knowledge of General Education information will improve ?Description: Including pain rating scale, medication(s)/side effects and non-pharmacologic comfort measures ?12/15/2021 1040 by Toriana Sponsel, Jay Schlichter, RN ?Outcome: Adequate for Discharge ?12/15/2021 0941 by Carter Kitten, RN ?Outcome: Progressing ?  ?Problem: Health Behavior/Discharge Planning: ?Goal: Ability to manage health-related needs will improve ?12/15/2021 1040 by Krystianna Soth, Jay Schlichter, RN ?Outcome: Adequate for Discharge ?12/15/2021 0941 by Carter Kitten, RN ?Outcome: Progressing ?  ?Problem: Clinical Measurements: ?Goal: Ability to maintain clinical measurements within normal limits will improve ?12/15/2021 1040 by Chelcey Caputo, Jay Schlichter, RN ?Outcome: Adequate for Discharge ?12/15/2021 0941 by Carter Kitten, RN ?Outcome: Progressing ?Goal: Will remain free from infection ?12/15/2021 1040 by Rivaan Kendall, Jay Schlichter, RN ?Outcome: Adequate for Discharge ?12/15/2021 0941 by Carter Kitten, RN ?Outcome: Progressing ?Goal: Diagnostic test results will improve ?12/15/2021 1040 by Rehema Muffley, Jay Schlichter, RN ?Outcome: Adequate for Discharge ?12/15/2021 0941 by Carter Kitten, RN ?Outcome: Progressing ?Goal: Respiratory complications will improve ?12/15/2021 1040 by Leeon Makar, Jay Schlichter, RN ?Outcome: Adequate for Discharge ?12/15/2021 0941 by Carter Kitten, RN ?Outcome: Progressing ?Goal: Cardiovascular complication will be avoided ?12/15/2021 1040 by Alexandrya Chim, Jay Schlichter, RN ?Outcome: Adequate for Discharge ?12/15/2021 0941 by Carter Kitten, RN ?Outcome: Progressing ?  ?Problem: Activity: ?Goal: Risk for activity intolerance will decrease ?12/15/2021 1040 by Lian Tanori, Jay Schlichter, RN ?Outcome: Adequate for Discharge ?12/15/2021 0941 by Carter Kitten, RN ?Outcome: Progressing ?  ?Problem: Nutrition: ?Goal: Adequate nutrition will be maintained ?12/15/2021 1040 by Sibel Khurana, Jay Schlichter, RN ?Outcome: Adequate for Discharge ?12/15/2021 0941 by  Carter Kitten, RN ?Outcome: Progressing ?  ?Problem: Coping: ?Goal: Level of anxiety will decrease ?12/15/2021 1040 by Arilynn Blakeney, Jay Schlichter, RN ?Outcome: Adequate for Discharge ?12/15/2021 0941 by Carter Kitten, RN ?Outcome: Progressing ?  ?Problem: Elimination: ?Goal: Will not experience complications related to bowel motility ?12/15/2021 1040 by Colene Mines, Jay Schlichter, RN ?Outcome: Adequate for Discharge ?12/15/2021 0941 by Carter Kitten, RN ?Outcome: Progressing ?Goal: Will not experience complications related to urinary retention ?12/15/2021 1040 by Alley Neils, Jay Schlichter, RN ?Outcome: Adequate for Discharge ?12/15/2021 0941 by Carter Kitten, RN ?Outcome: Progressing ?  ?Problem: Pain Managment: ?Goal: General experience of comfort will improve ?12/15/2021 1040 by Ardith Test, Jay Schlichter, RN ?Outcome: Adequate for Discharge ?12/15/2021 0941 by Carter Kitten, RN ?Outcome: Progressing ?  ?Problem: Safety: ?Goal: Ability to remain free from injury will improve ?12/15/2021 1040 by Tarek Cravens, Jay Schlichter, RN ?Outcome: Adequate for Discharge ?12/15/2021 0941 by Carter Kitten, RN ?Outcome: Progressing ?  ?Problem: Skin Integrity: ?Goal: Risk for impaired skin integrity will decrease ?12/15/2021 1040 by Asahel Risden, Jay Schlichter, RN ?Outcome: Adequate for Discharge ?12/15/2021 0941 by Carter Kitten, RN ?Outcome: Progressing ?  ?

## 2021-12-15 NOTE — Assessment & Plan Note (Signed)
Noted to have a mass coming out of her vaginal area.  Examination by overnight staff suggested a cystocele.  Patient apparently has history of same.  Discussed with GYN who recommend outpatient follow-up.   ?She initially did had some difficulty voiding but was ultimately able to void when she was sat on the commode.  We will continue to watch for retention. ?

## 2021-12-15 NOTE — Care Management Important Message (Signed)
Important Message ? ?Patient Details  ?Name: Leah Ritter ?MRN: [id number] ?Date of Birth: [birth year] ? ? ?Medicare Important Message Given:  Yes ? ? ? ? ?Olegario Messier A Mykah Bellomo ?12/15/2021, 10:53 AM ?

## 2021-12-15 NOTE — Evaluation (Signed)
Physical Therapy Evaluation ?Patient Details ?Name: Leah Ritter ?MRN: [id number] ?DOB: 1951-01-06 ?Today's Date: 12/15/2021 ? ?History of Present Illness ? Pt is a 71 y.o. female with medical history significant of DM II, HTN, coming from [facility] unit for AMS. MD assessment includes: Acute metabolic encephalopathy, bacteremia, hyperkalemia, AKI, UTI, HTN, hypothermia. ?  ?Clinical Impression ? History and PLOF obtained from pt's daughter at bedside.  At baseline pt ambulates with Mod I with a rollator or without an AD at her ALF and per daughter the patient walks frequently throughout her facility.  Pt required significant time and encouragement during the session with assistance from pt's daughter but once up was able to amb 180 feet with a RW for most of the distance with the last 10 feet without an AD.  Pt steady with cues for safe sequencing with the RW with a slow cadence and short B step length.  Per daughter the pt's ambulation is more effortful than at baseline and pt would benefit from a trial of HHPT upon discharge to safely address deficits listed in patient problem list for decreased caregiver assistance and eventual return to PLOF. ?   ?   ? ?Recommendations for follow up therapy are one component of a multi-disciplinary discharge planning process, led by the attending physician.  Recommendations may be updated based on patient status, additional functional criteria and insurance authorization. ? ?Follow Up Recommendations Home health PT ? ?  ?Assistance Recommended at Discharge Frequent or constant Supervision/Assistance  ?Patient can return home with the following ? A little help with walking and/or transfers;A little help with bathing/dressing/bathroom;Assistance with cooking/housework;Direct supervision/assist for medications management;Direct supervision/assist for financial management;Assist for transportation ? ?  ?Equipment Recommendations None recommended by PT  ?Recommendations  for Other Services ?    ?  ?Functional Status Assessment Patient has had a recent decline in their functional status and demonstrates the ability to make significant improvements in function in a reasonable and predictable amount of time.  ? ?  ?Precautions / Restrictions Precautions ?Precautions: Fall ?Restrictions ?Weight Bearing Restrictions: No  ? ?  ? ?Mobility ? Bed Mobility ?Overal bed mobility: Needs Assistance ?Bed Mobility: Supine to Sit ?  ?  ?Supine to sit: Mod assist ?  ?  ?General bed mobility comments: Mod A for BLE and trunk control, likely limited by cognition ?  ? ?Transfers ?Overall transfer level: Needs assistance ?Equipment used: Rolling walker (2 wheels) ?Transfers: Sit to/from Stand ?Sit to Stand: Min guard ?  ?  ?  ?  ?  ?General transfer comment: Good eccentric and concentric control and stability during sit to/from stand training from multiple height surfaces ?  ? ?Ambulation/Gait ?Ambulation/Gait assistance: Supervision ?Gait Distance (Feet): 180 Feet ?Assistive device: Rolling walker (2 wheels) ?Gait Pattern/deviations: Step-through pattern, Decreased step length - right, Decreased step length - left ?Gait velocity: decreased ?  ?  ?General Gait Details: Slow cadence but steady without LOB with mod cues for sequencing/initiation of movement ? ?Stairs ?  ?  ?  ?  ?  ? ?Wheelchair Mobility ?  ? ?Modified Rankin (Stroke Patients Only) ?  ? ?  ? ?Balance Overall balance assessment: Needs assistance ?Sitting-balance support: No upper extremity supported, Feet supported ?Sitting balance-Leahy Scale: Good ?  ?  ?Ritter balance support: During functional activity, Bilateral upper extremity supported ?Ritter balance-Leahy Scale: Good ?  ?  ?  ?  ?  ?  ?  ?  ?  ?  ?  ?  ?   ? ? ? ?  Pertinent Vitals/Pain Pain Assessment ?Breathing: normal ?Negative Vocalization: none ?Facial Expression: smiling or inexpressive ?Body Language: relaxed ?Consolability: no need to console ?PAINAD Score: 0  ? ? ?Home  Living Family/patient expects to be discharged to:: Assisted living ?  ?  ?  ?  ?  ?  ?  ?  ?Home Equipment: Rollator (4 wheels) ?Additional Comments: Lives at [facility] in memory care  ?  ?Prior Function Prior Level of Function : Needs assist ?  ?  ?  ?  ?  ?  ?Mobility Comments: Pt able to amb facility distances with a rollator ?ADLs Comments: Assist for ADLs as needed, wears depends ?  ? ? ?Hand Dominance  ? Dominant Hand: Right ? ?  ?Extremity/Trunk Assessment  ? Upper Extremity Assessment ?Upper Extremity Assessment: Overall WFL for tasks assessed ?  ? ?Lower Extremity Assessment ?Lower Extremity Assessment: Overall WFL for tasks assessed ?  ? ?   ?Communication  ? Communication: Expressive difficulties  ?Cognition Arousal/Alertness: Awake/alert ?Behavior During Therapy: [facility] for tasks assessed/performed ?Overall Cognitive Status: History of cognitive impairments - at baseline ?  ?  ?  ?  ?  ?  ?  ?  ?  ?  ?  ?  ?  ?  ?  ?  ?  ?  ?  ? ?  ?General Comments   ? ?  ?Exercises    ? ?Assessment/Plan  ?  ?PT Assessment Patient needs continued PT services  ?PT Problem List Decreased activity tolerance;Decreased balance;Decreased knowledge of use of DME ? ?   ?  ?PT Treatment Interventions DME instruction;Gait training;Functional mobility training;Therapeutic activities;Therapeutic exercise;Balance training;Patient/family education   ? ?PT Goals (Current goals can be found in the Care Plan section)  ?Acute Rehab PT Goals ?PT Goal Formulation: Patient unable to participate in goal setting ?Time For Goal Achievement: 12/28/21 ?Potential to Achieve Goals: Good ? ?  ?Frequency Min 2X/week ?  ? ? ?Co-evaluation   ?  ?  ?  ?  ? ? ?  ?AM-PAC PT "6 Clicks" Mobility  ?Outcome Measure Help needed turning from your back to your side while in a flat bed without using bedrails?: A Lot ?Help needed moving from lying on your back to sitting on the side of a flat bed without using bedrails?: A Lot ?Help needed moving to and from  a bed to a chair (including a wheelchair)?: A Little ?Help needed Ritter up from a chair using your arms (e.g., wheelchair or bedside chair)?: A Little ?Help needed to walk in hospital room?: A Little ?Help needed climbing 3-5 steps with a railing? : A Lot ?6 Click Score: 15 ? ?  ?End of Session Equipment Utilized During Treatment: Gait belt ?Activity Tolerance: Patient tolerated treatment well ?Patient left: Other (comment) (pt left in BR with daughter present with with nursing stating was on her way to assist) ?Nurse Communication: Mobility status ?PT Visit Diagnosis: Difficulty in walking, not elsewhere classified (R26.2) ?  ? ?Time: [id number] ?PT Time Calculation (min) (ACUTE ONLY): 24 min ? ? ?Charges:   PT Evaluation ?$PT Eval Moderate Complexity: 1 Mod ?PT Treatments ?$Gait Training: 8-22 mins ?  ?   ? ?D. Royetta Asal PT, DPT ?12/15/21, 12:05 PM ? ? ? ?

## 2021-12-15 NOTE — Progress Notes (Signed)
Patient did not void overnight. Bladder scan showed 1045cc. Assisted patient to [facility] and encouraged voiding. Patient then assisted back to bed. Voided 550cc. When cleaning patient, it was noticed a ball like tissue protruding out of patients vagina. Suspecting it was the bladder, bladder scan was preformed and showed 500cc. Patient denied pain and is stable. NP [place] made aware. Instructed that she would come to the bedside if time allowed and to inform day shift nurse about it.  ?

## 2021-12-15 NOTE — Plan of Care (Signed)

## 2021-12-15 NOTE — Discharge Summary (Signed)
?[facility] ? ?Physician Discharge Summary  ? ?Patient ID: ?Leah Ritter ?MRN: [id number] ?DOB/AGE: [birth year] 71 y.o. ? ?Admit date: 12/10/2021 ?Discharge date:   12/15/2021 ? ? ?PCP: Mortimer Fries, MD ? ?DISCHARGE DIAGNOSES:  ?Principal Problem: ?  Acute metabolic encephalopathy ?Active Problems: ?  AKI (acute kidney injury) (HCC) ?  Hyperkalemia ?  Bacteremia ?  HYPERTENSION, BENIGN SYSTEMIC ?  UTI (urinary tract infection) ?  Chronic diastolic CHF (congestive heart failure) (HCC) ?  Type 2 diabetes mellitus without complication, with long-term current use of insulin (HCC) ?  A-fib (HCC) ?  History of stroke ?  Anemia ?  GERD (gastroesophageal reflux disease) ?  Sleep apnea ?  Abnormal LFTs ?  Cystocele with prolapse ? ? ?RECOMMENDATIONS FOR OUTPATIENT FOLLOW UP: ?Please check CBC and basic metabolic panel in 5 to 7 days ?Patient needs to follow-up with GYN, Dr. Feliberto Gottron, in 1 to 2 weeks for her cystocele ? ?Home Health: To be determined.  Patient lives in memory care unit ?Equipment/Devices: None ? ?CODE STATUS: Full code ? ?DISCHARGE CONDITION: fair ? ?Diet recommendation: Low-sodium ? ?INITIAL HISTORY: ?71 y.o. female with medical history significant of DM II, HTN, coming from SNF for AMS.  Apparently has been vomiting for the past few days.  Has been drooling.  Patient does have a history of stroke and dementia.  Patient was hospitalized for further evaluation.  Noted to have UTI.  Started on ceftriaxone.  Urine culture positive for yeast.  Diflucan was initiated. ? ?Consultations: ?GYN, Dr. Feliberto Gottron ? ?Procedures: ?None ? ?HOSPITAL COURSE:  ? ? ?* Acute metabolic encephalopathy ?Differential diagnosis includes infection, possible aspiration pneumonia, UA possible UTI.  CT head did not show any acute findings.  MRI brain did not show stroke.  Ultrasound of the abdomen was done due to abnormal LFTs which did not show acute cholecystitis though it did show cholelithiasis. ?Patient was started on  antibiotics.  EEG did not show any epileptiform activity.  Urine culture grew yeast.  Started on antifungal.  Mentation has improved in the last 48 hours and she seems to be close to baseline now. ?Waiting on PT evaluation since patient lives in memory care unit and daughter was concerned that they may be unable to take care of her. ? ?Bacteremia ?Gram-positive cocci noted on blood culture.  Only in 1 set.  Grew out to be Staphylococcus capitis.  Most likely a contaminant.  She remains afebrile with normal WBC.   ? ?Hyperkalemia ?At the time of admission she was noted to be severely hyperkalemic with potassium level of 7.  Etiology could be due to medications.  She was noted to be on Aldactone.  Dehydration and acute kidney injury also contributing.  She was given dextrose insulin and calcium.  Also given Kayexalate and Lokelma. ?Potassium has improved.  Stop Aldactone.  Recheck labs in 1 week. ? ?AKI (acute kidney injury) (HCC) ?Creatinine was 1.02 in October 2022.  Presented with creatinine of 1.76.  Likely dehydrated from nausea and vomiting and poor oral intake.   ?Renal function improved with IV fluids.  We will stop her Aldactone due to hyperkalemia.  Okay to stop IV fluids. ? ?UTI (urinary tract infection) ?UA noted to be abnormal.  Initially treated with ceftriaxone.    ?Urine culture growing greater than 100,000 colonies of yeast.  Started on Diflucan.  No bacteria identified on UA.  Ceftriaxone was subsequently discontinued.  Diflucan for 5 more days. ? ?HYPERTENSION, BENIGN SYSTEMIC ?Due to low blood pressures  her antihypertensives were held including Aldactone, metoprolol, furosemide.   ?Uptrending blood pressures were noted.  Started back on metoprolol.  Continue to hold Aldactone. ? ?Chronic diastolic CHF (congestive heart failure) (HCC) ?Discontinue Aldactone.  May resume furosemide at discharge.  Monitor electrolytes closely ? ?A-fib (HCC) ?Appears to be paroxysmal.  EKG shows sinus rhythm.  On  metoprolol and Xarelto prior to admission.  ?These have been resumed. ? ?Type 2 diabetes mellitus without complication, with long-term current use of insulin (HCC) ?HbA1c 9.7.  Episodes of hypoglycemia due to poor oral intake.  Now improved.  Continue to monitor CBGs.  May resume glipizide.   ? ?Anemia ?Hemoglobin stable for the most part.  No evidence for overt bleeding. ? ?History of stroke ?Patient has a history of stroke and atrial fibrillation. ?Patient on Xarelto for stroke prevention.  MRI was done during this admission which does not show any acute stroke. ? ?Sleep apnea ?CPAP per home settings.   ? ?Abnormal LFTs ?AST and ALT noted to be mildly elevated.  Abdomen is benign.  Ultrasound of the upper right quadrant showed cholelithiasis without any evidence for cholecystitis.  No focal liver lesions were noted.  ?Hepatitis panel was unremarkable.  Check periodically. ? ?Cystocele with prolapse ?Noted to have a mass coming out of her vaginal area.  Examination by overnight staff suggested a cystocele.  Patient apparently has history of same.  Discussed with GYN who recommend outpatient follow-up.   ?She initially did had some difficulty voiding but was ultimately able to void when she was sat on the commode.  We will continue to watch for retention. ? ?Hypothermia ?Patient initially was hypothermic and warming blanket. ? ? ?Obesity ?Estimated body mass index is 30.37 kg/m? as calculated from the following: ?  Height as of this encounter: 5\' 10"  (1.778 m). ?  Weight as of this encounter: 96 kg. ? ?Patient is stable. If she is able to void and has been seen by physical therapy she should be able to return to her memory care unit if they are able to take her back. ? ?Okay for discharge. ? ? ?PERTINENT LABS: ? ?The results of significant diagnostics from this hospitalization (including imaging, microbiology, ancillary and laboratory) are listed below for reference.   ? ?Microbiology: ?Recent Results (from the past  240 hour(s))  ?Urine Culture     Status: Abnormal  ? Collection Time: 12/10/21  4:54 PM  ? Specimen: Urine, Random  ?Result Value Ref Range Status  ? Specimen Description   Final  ?  URINE, RANDOM ?Performed at [facility], [address] [place], [place] [address] ?  ? Special Requests   Final  ?  NONE ?Performed at [facility], [address] [place], [place] [address] ?  ? Culture >=100,000 COLONIES/mL YEAST (A)  Final  ? Report Status 12/12/2021 FINAL  Final  ?Culture, blood (routine x 2)     Status: Abnormal  ? Collection Time: 12/10/21  7:12 PM  ? Specimen: BLOOD  ?Result Value Ref Range Status  ? Specimen Description   Final  ?  BLOOD LEFT ASSIST CONTROL ?Performed at [facility], [address], [place], [place] [address] ?  ? Special Requests   Final  ?  BOTTLES DRAWN AEROBIC AND ANAEROBIC Blood Culture results may not be optimal due to an inadequate volume of blood received in culture bottles ?Performed at [facility], [address] [place], [place] [address] ?  ? Culture  Setup  Time   Final  ?  AEROBIC BOTTLE ONLY ?GRAM POSITIVE COCCI ?CRITICAL RESULT CALLED TO, READ BACK BY AND VERIFIED WITH: BRANDON BEERS @1444  12/11/21 MJU ?  ? Culture (A)  Final  ?  STAPHYLOCOCCUS CAPITIS ?THE SIGNIFICANCE OF ISOLATING THIS ORGANISM FROM A SINGLE SET OF BLOOD CULTURES WHEN MULTIPLE SETS ARE DRAWN IS UNCERTAIN. PLEASE NOTIFY THE MICROBIOLOGY DEPARTMENT WITHIN ONE WEEK IF SPECIATION AND SENSITIVITIES ARE REQUIRED. ?Performed at [facility], [address] [place], [place] ?  ? Report Status 12/12/2021 FINAL  Final  ?Blood Culture ID Panel (Reflexed)     Status: Abnormal  ? Collection Time: 12/10/21  7:12 PM  ?Result Value Ref Range Status  ? Enterococcus faecalis NOT DETECTED NOT DETECTED Final  ? Enterococcus Faecium NOT DETECTED NOT DETECTED Final  ? Listeria monocytogenes NOT DETECTED NOT DETECTED Final  ? Staphylococcus species DETECTED (A) NOT  DETECTED Final  ?  Comment: CRITICAL RESULT CALLED TO, READ BACK BY AND VERIFIED WITH: ?BRANDON BEERS @1444  12/11/21 MJU ?  ? Staphylococcus aureus (BCID) NOT DETECTED NOT DETECTED Final  ? Staphylococcus epidermidi

## 2021-12-15 NOTE — NC FL2 (Signed)
?[place] MEDICAID FL2 LEVEL OF CARE SCREENING TOOL  ?  ? ?IDENTIFICATION  ?Patient Name: ?Leah Ritter Birthdate: 1950-12-02 Sex: female Admission Date (Current Location): ?12/10/2021  ?[place] and [place] Number: ? [place] ?  Facility and Address:  ?[facility], [address] [place], [place] [address] ?     Provider Number: ?[id number]  ?Attending Physician Name and Address:  ?Bonnielee Haff, MD ? Relative Name and Phone Number:  ?  ?   ?Current Level of Care: ?Hospital Recommended Level of Care: ?[place], Memory Care Prior Approval Number: ?  ? ?Date Approved/Denied: ?  PASRR Number: ?  ? ?Discharge Plan: ?Other (Comment) [facility]F memory care) ?  ? ?Current Diagnoses: ?Patient Active Problem List  ? Diagnosis Date Noted  ? Bacteremia 12/12/2021  ? UTI (urinary tract infection) 12/11/2021  ? Abnormal LFTs 12/11/2021  ? Hyperkalemia 12/10/2021  ? Anemia 12/10/2021  ? Acute metabolic encephalopathy 99991111  ? Hypothermia 07/06/2021  ? Type II diabetes mellitus with renal manifestations ([place]) 07/06/2021  ? History of stroke   ? A-fib ([place])   ? Chronic diastolic CHF (congestive heart failure) ([place])   ? Depression with anxiety   ? CKD (chronic kidney disease), stage IIIb   ? Aspiration pneumonia ([place])   ? Nausea & vomiting   ? AKI (acute kidney injury) ([place])   ? Hyperglycemia   ? SIRS (systemic inflammatory response syndrome) ([place]) 01/09/2019  ? Acute on chronic systolic CHF (congestive heart failure) ([place]) 01/09/2019  ? Pain in joint involving ankle and foot 01/03/2018  ? Localized, primary osteoarthritis of shoulder region 01/03/2018  ? Rupture of tendon of biceps, long head 01/03/2018  ? Disorder of bursae of shoulder region 01/03/2018  ? Screening declined by patient 10/25/2017  ? Full thickness rotator cuff tear 04/21/2016  ? Chronic bilateral low back pain without sciatica 02/10/2016  ? H/O rotator cuff tear 02/10/2016  ? History of vitamin D deficiency  02/10/2016  ? Type 2 diabetes mellitus without complication, with long-term current use of insulin ([place]) 02/10/2016  ? HLD (hyperlipidemia) 11/24/2006  ? OBESITY, NOS 11/24/2006  ? POST TRAUMATIC STRESS DISORDER 11/24/2006  ? DEPRESSIVE DISORDER, NOS 11/24/2006  ? HYPERTENSION, BENIGN SYSTEMIC 11/24/2006  ? RHINITIS, ALLERGIC 11/24/2006  ? GERD (gastroesophageal reflux disease) 11/24/2006  ? OSTEOARTHRITIS, MULTI SITES 11/24/2006  ? Sleep apnea 11/24/2006  ? ? ?Orientation RESPIRATION BLADDER Height & Weight   ?  ?Self ? Normal Incontinent Weight: 211 lb 10.3 oz (96 kg) ?Height:  5\' 10"  (177.8 cm)  ?BEHAVIORAL SYMPTOMS/MOOD NEUROLOGICAL BOWEL NUTRITION STATUS  ?    Continent Diet (heart healthy/carb modified, thin liquids)  ?AMBULATORY STATUS COMMUNICATION OF NEEDS Skin   ?Limited Assist Verbally Normal ?  ?  ?  ?    ?     ?     ? ? ?Personal Care Assistance Level of Assistance  ?Bathing, Feeding, Dressing Bathing Assistance: Limited assistance ?Feeding assistance: Limited assistance ?Dressing Assistance: Limited assistance ?   ? ?Functional Limitations Info  ?Sight, Hearing, Speech Sight Info: Adequate ?Hearing Info: Adequate ?Speech Info: Adequate  ? ? ?SPECIAL CARE FACTORS FREQUENCY  ?PT (By licensed PT), OT (By licensed OT)   ?  ?  ?  ?  ?  ?  ?   ? ? ?Contractures Contractures Info: Not present  ? ? ?Additional Factors Info  ?Code Status, Allergies Code Status Info: full code ?Allergies Info: Oxycodone-acetaminophen, Ezetimibe, Lisinopril, Statins ?  ?  ?  ?   ? ?  Current Medications (12/15/2021):  This is the current hospital active medication list ?Current Facility-Administered Medications  ?Medication Dose Route Frequency Provider Last Rate Last Admin  ? atorvastatin (LIPITOR) tablet 80 mg  80 mg Oral q1800 Bonnielee Haff, MD   80 mg at 12/14/21 1737  ? dextrose 5 %-0.45 % sodium chloride infusion   Intravenous Continuous Bonnielee Haff, MD 50 mL/hr at 12/15/21 0027 New Bag at 12/15/21 0027  ? fluconazole  (DIFLUCAN) tablet 200 mg  200 mg Oral Daily Darrick Penna, RPH   200 mg at 12/14/21 1348  ? metoprolol tartrate (LOPRESSOR) injection 2.5 mg  2.5 mg Intravenous Q6H PRN Bonnielee Haff, MD      ? metoprolol tartrate (LOPRESSOR) tablet 50 mg  50 mg Oral BID Bonnielee Haff, MD   50 mg at 12/14/21 2109  ? mirabegron ER (MYRBETRIQ) tablet 25 mg  25 mg Oral Daily Bonnielee Haff, MD   25 mg at 12/14/21 1042  ? pantoprazole (PROTONIX) EC tablet 40 mg  40 mg Oral QPM Darrick Penna, RPH   40 mg at 12/14/21 1737  ? QUEtiapine (SEROQUEL) tablet 25 mg  25 mg Oral QHS Bonnielee Haff, MD   25 mg at 12/14/21 2109  ? rivaroxaban (XARELTO) tablet 20 mg  20 mg Oral Daily Bonnielee Haff, MD   20 mg at 12/14/21 1042  ? ? ? ?Discharge Medications: ?Please see discharge summary for a list of discharge medications. ? ?Relevant Imaging Results: ? ?Relevant Lab Results: ? ? ?Additional Information ?[id number] ? ?Alger Kerstein A Hue Steveson, LCSW ? ? ? ? ?

## 2021-12-15 NOTE — TOC Initial Note (Signed)
Transition of Care (TOC) - Initial/Assessment Note  ? ? ?Patient Details  ?Name: Leah Ritter ?MRN: [id number] ?Date of Birth: 1950-11-19 ? ?Transition of Care (TOC) CM/SW Contact:    ?Jessicalynn Deshong A Even Budlong, LCSW ?Phone Number: ?12/15/2021, 9:18 AM ? ?Clinical Narrative:    CSW spoke with patient's daughter. Pt's daughter confirmed pt is from [facility]. Per pt's daughter pt was not getting therapy before being admitted to the hospital. Per pt's daughter plan is for pt to return at d/c. CSW will send clinical information to [facility].             ? ? ?Expected Discharge Plan: Memory Care ?Barriers to Discharge: Continued Medical Work up ? ? ?Patient Goals and CMS Choice ?Patient states their goals for this hospitalization and ongoing recovery are:: per daughter for pt to return to [facility] ?CMS Medicare.gov Compare Post Acute Care list provided to:: Patient ?  ? ?Expected Discharge Plan and Services ?Expected Discharge Plan: Memory Care ?In-house Referral: Clinical Social Work ?  ?Post Acute Care Choice: NA ?Living arrangements for the past 2 months: [place] ?                ?  ?  ?  ?  ?  ?  ?  ?  ?  ?  ? ?Prior Living Arrangements/Services ?Living arrangements for the past 2 months: [place] ?Lives with:: Facility Resident ?Patient language and need for interpreter reviewed:: Yes ?Do you feel safe going back to the place where you live?: Yes      ?Need for Family Participation in Patient Care: Yes (Comment) ?Care giver support system in place?: Yes (comment) ?  ?Criminal Activity/Legal Involvement Pertinent to Current Situation/Hospitalization: No - Comment as needed ? ?Activities of Daily Living ?Home Assistive Devices/Equipment: Wheelchair, Environmental consultant (specify type), Dentures (specify type) ?ADL Screening (condition at time of admission) ?Patient's cognitive ability adequate to safely complete daily activities?: No ?Is the patient deaf or have difficulty hearing?: No ?Does the  patient have difficulty seeing, even when wearing glasses/contacts?: No ?Does the patient have difficulty concentrating, remembering, or making decisions?: Yes ?Patient able to express need for assistance with ADLs?: No ?Does the patient have difficulty dressing or bathing?: Yes ?Independently performs ADLs?: No ?Communication: Independent ?Dressing (OT): Needs assistance ?Is this a change from baseline?: Pre-admission baseline ?Grooming: Needs assistance ?Is this a change from baseline?: Pre-admission baseline ?Feeding: Needs assistance ?Is this a change from baseline?: Change from baseline, expected to last <3 days ?Bathing: Needs assistance ?Is this a change from baseline?: Pre-admission baseline ?Toileting: Needs assistance ?Is this a change from baseline?: Pre-admission baseline ?In/Out Bed: Needs assistance ?Is this a change from baseline?: Change from baseline, expected to last <3 days ?Walks in Home: Needs assistance ?Is this a change from baseline?: Change from baseline, expected to last <3 days ?Does the patient have difficulty walking or climbing stairs?: Yes ?Weakness of Legs: Both ?Weakness of Arms/Hands: None ? ?Permission Sought/Granted ?Permission sought to share information with : Family Supports ?Permission granted to share information with : Yes, Release of Information Signed ? Share Information with NAME: [place] ? Permission granted to share info w AGENCY: [facility] ? Permission granted to share info w Relationship: daughter ?   ? ?Emotional Assessment ?Appearance:: Appears stated age ?Attitude/Demeanor/Rapport: Unable to Assess ?Affect (typically observed): Unable to Assess ?Orientation: : Oriented to Self ?Alcohol / Substance Use: Not Applicable ?Psych Involvement: No (comment) ? ?Admission diagnosis:  Hyperkalemia [E87.5] ?  Abnormal LFTs (liver function tests) [R79.89] ?Altered mental status, unspecified altered mental status type [R41.82] ?Acute metabolic encephalopathy  99991111 ?Aspiration pneumonia due to regurgitated food, unspecified laterality, unspecified part of lung ([place]) [J69.0] ?AMS (altered mental status) [R41.82] ?Acute renal failure superimposed on stage 3 chronic kidney disease, unspecified acute renal failure type, unspecified whether stage 3a or 3b CKD ([place]) [N17.9, N18.30] ?Patient Active Problem List  ? Diagnosis Date Noted  ? Bacteremia 12/12/2021  ? UTI (urinary tract infection) 12/11/2021  ? Abnormal LFTs 12/11/2021  ? Hyperkalemia 12/10/2021  ? Anemia 12/10/2021  ? Acute metabolic encephalopathy 99991111  ? Hypothermia 07/06/2021  ? Type II diabetes mellitus with renal manifestations ([place]) 07/06/2021  ? History of stroke   ? A-fib ([place])   ? Chronic diastolic CHF (congestive heart failure) ([place])   ? Depression with anxiety   ? CKD (chronic kidney disease), stage IIIb   ? Aspiration pneumonia ([place])   ? Nausea & vomiting   ? AKI (acute kidney injury) ([place])   ? Hyperglycemia   ? SIRS (systemic inflammatory response syndrome) ([place]) 01/09/2019  ? Acute on chronic systolic CHF (congestive heart failure) ([place]) 01/09/2019  ? Pain in joint involving ankle and foot 01/03/2018  ? Localized, primary osteoarthritis of shoulder region 01/03/2018  ? Rupture of tendon of biceps, long head 01/03/2018  ? Disorder of bursae of shoulder region 01/03/2018  ? Screening declined by patient 10/25/2017  ? Full thickness rotator cuff tear 04/21/2016  ? Chronic bilateral low back pain without sciatica 02/10/2016  ? H/O rotator cuff tear 02/10/2016  ? History of vitamin D deficiency 02/10/2016  ? Type 2 diabetes mellitus without complication, with long-term current use of insulin ([place]) 02/10/2016  ? HLD (hyperlipidemia) 11/24/2006  ? OBESITY, NOS 11/24/2006  ? POST TRAUMATIC STRESS DISORDER 11/24/2006  ? DEPRESSIVE DISORDER, NOS 11/24/2006  ? HYPERTENSION, BENIGN SYSTEMIC 11/24/2006  ? RHINITIS, ALLERGIC 11/24/2006  ? GERD (gastroesophageal reflux disease) 11/24/2006  ? OSTEOARTHRITIS,  MULTI SITES 11/24/2006  ? Sleep apnea 11/24/2006  ? ?PCP:  Lesia Hausen, MD ?Pharmacy:   ?[facility], [address] ?[place] [address] ?Phone: [phone] Fax: [phone] ? ? ? ? ?Social Determinants of Health (SDOH) Interventions ?  ? ?Readmission Risk Interventions ?No flowsheet data found. ? ? ?

## 2021-12-15 NOTE — Progress Notes (Addendum)
? ?      CROSS COVER NOTE ? ?NAME: Leah Ritter ?MRN: [id number] ?DOB : 1951-06-02 ? ? ?Nursing reports they noticed "something protruding from her vagina"  On bedside evaluation patient has what appears to be a grade III cystocele. Pink and soft, reduction not attempted. Patient is pleasantly confused, cooperative with exam, and in no acute distress. Patient with known history of vaginal and rectal prolapse that has been reduced in the past, patient could benefit from pessary. GYN consulted, Dr Ouida Sills more than happy to see patient on outpatient basis. ? ?[name] MHA, MSN, FNP-BC ?Nurse Practitioner ?[facility] ?[place] ?Pager [phone] ? ?

## 2022-07-15 ENCOUNTER — Other Ambulatory Visit: Payer: Self-pay

## 2022-07-15 ENCOUNTER — Emergency Department: Payer: Medicare Other

## 2022-07-15 ENCOUNTER — Inpatient Hospital Stay
Admission: EM | Admit: 2022-07-15 | Discharge: 2022-07-21 | DRG: 871 | Disposition: A | Discharge status: Hospice | Payer: Medicare Other | Source: Skilled Nursing Facility | Attending: Internal Medicine | Admitting: Internal Medicine

## 2022-07-15 DIAGNOSIS — Z886 Allergy status to analgesic agent status: Secondary | ICD-10-CM

## 2022-07-15 DIAGNOSIS — G4733 Obstructive sleep apnea (adult) (pediatric): Secondary | ICD-10-CM | POA: Diagnosis present

## 2022-07-15 DIAGNOSIS — I959 Hypotension, unspecified: Secondary | ICD-10-CM | POA: Diagnosis present

## 2022-07-15 DIAGNOSIS — Z841 Family history of disorders of kidney and ureter: Secondary | ICD-10-CM

## 2022-07-15 DIAGNOSIS — F419 Anxiety disorder, unspecified: Secondary | ICD-10-CM | POA: Diagnosis present

## 2022-07-15 DIAGNOSIS — E11649 Type 2 diabetes mellitus with hypoglycemia without coma: Secondary | ICD-10-CM | POA: Diagnosis present

## 2022-07-15 DIAGNOSIS — T68XXXA Hypothermia, initial encounter: Secondary | ICD-10-CM | POA: Diagnosis present

## 2022-07-15 DIAGNOSIS — A4150 Gram-negative sepsis, unspecified: Principal | ICD-10-CM | POA: Diagnosis present

## 2022-07-15 DIAGNOSIS — Z79899 Other long term (current) drug therapy: Secondary | ICD-10-CM

## 2022-07-15 DIAGNOSIS — N39498 Other specified urinary incontinence: Secondary | ICD-10-CM | POA: Diagnosis present

## 2022-07-15 DIAGNOSIS — Z888 Allergy status to other drugs, medicaments and biological substances status: Secondary | ICD-10-CM

## 2022-07-15 DIAGNOSIS — I5032 Chronic diastolic (congestive) heart failure: Secondary | ICD-10-CM | POA: Diagnosis present

## 2022-07-15 DIAGNOSIS — Z8249 Family history of ischemic heart disease and other diseases of the circulatory system: Secondary | ICD-10-CM

## 2022-07-15 DIAGNOSIS — S01551A Open bite of lip, initial encounter: Secondary | ICD-10-CM | POA: Diagnosis present

## 2022-07-15 DIAGNOSIS — I480 Paroxysmal atrial fibrillation: Secondary | ICD-10-CM | POA: Diagnosis present

## 2022-07-15 DIAGNOSIS — R569 Unspecified convulsions: Secondary | ICD-10-CM | POA: Diagnosis not present

## 2022-07-15 DIAGNOSIS — I10 Essential (primary) hypertension: Secondary | ICD-10-CM

## 2022-07-15 DIAGNOSIS — F03918 Unspecified dementia, unspecified severity, with other behavioral disturbance: Secondary | ICD-10-CM

## 2022-07-15 DIAGNOSIS — Z823 Family history of stroke: Secondary | ICD-10-CM

## 2022-07-15 DIAGNOSIS — N390 Urinary tract infection, site not specified: Secondary | ICD-10-CM | POA: Diagnosis present

## 2022-07-15 DIAGNOSIS — E785 Hyperlipidemia, unspecified: Secondary | ICD-10-CM | POA: Diagnosis present

## 2022-07-15 DIAGNOSIS — E1165 Type 2 diabetes mellitus with hyperglycemia: Secondary | ICD-10-CM | POA: Diagnosis not present

## 2022-07-15 DIAGNOSIS — X58XXXA Exposure to other specified factors, initial encounter: Secondary | ICD-10-CM | POA: Diagnosis present

## 2022-07-15 DIAGNOSIS — Z803 Family history of malignant neoplasm of breast: Secondary | ICD-10-CM

## 2022-07-15 DIAGNOSIS — R29722 NIHSS score 22: Secondary | ICD-10-CM | POA: Diagnosis present

## 2022-07-15 DIAGNOSIS — Z8673 Personal history of transient ischemic attack (TIA), and cerebral infarction without residual deficits: Secondary | ICD-10-CM

## 2022-07-15 DIAGNOSIS — Z7984 Long term (current) use of oral hypoglycemic drugs: Secondary | ICD-10-CM

## 2022-07-15 DIAGNOSIS — Z66 Do not resuscitate: Secondary | ICD-10-CM | POA: Diagnosis present

## 2022-07-15 DIAGNOSIS — Z7901 Long term (current) use of anticoagulants: Secondary | ICD-10-CM

## 2022-07-15 DIAGNOSIS — I459 Conduction disorder, unspecified: Secondary | ICD-10-CM | POA: Diagnosis present

## 2022-07-15 DIAGNOSIS — G928 Other toxic encephalopathy: Secondary | ICD-10-CM | POA: Diagnosis present

## 2022-07-15 DIAGNOSIS — I110 Hypertensive heart disease with heart failure: Secondary | ICD-10-CM | POA: Diagnosis present

## 2022-07-15 DIAGNOSIS — F01518 Vascular dementia, unspecified severity, with other behavioral disturbance: Secondary | ICD-10-CM | POA: Diagnosis present

## 2022-07-15 DIAGNOSIS — K219 Gastro-esophageal reflux disease without esophagitis: Secondary | ICD-10-CM | POA: Diagnosis present

## 2022-07-15 LAB — CBC WITH DIFFERENTIAL/PLATELET
Abs Immature Granulocytes: 0.01 10*3/uL (ref 0.00–0.07)
Basophils Absolute: 0 10*3/uL (ref 0.0–0.1)
Basophils Relative: 0 %
Eosinophils Absolute: 0.1 10*3/uL (ref 0.0–0.5)
Eosinophils Relative: 1 %
HCT: 38 % (ref 36.0–46.0)
Hemoglobin: 11.9 g/dL — ABNORMAL LOW (ref 12.0–15.0)
Immature Granulocytes: 0 %
Lymphocytes Relative: 12 %
Lymphs Abs: 0.7 10*3/uL (ref 0.7–4.0)
MCH: 28.1 pg (ref 26.0–34.0)
MCHC: 31.3 g/dL (ref 30.0–36.0)
MCV: 89.8 fL (ref 80.0–100.0)
Monocytes Absolute: 0.3 10*3/uL (ref 0.1–1.0)
Monocytes Relative: 6 %
Neutro Abs: 4.5 10*3/uL (ref 1.7–7.7)
Neutrophils Relative %: 81 %
Platelets: 175 10*3/uL (ref 150–400)
RBC: 4.23 MIL/uL (ref 3.87–5.11)
RDW: 15 % (ref 11.5–15.5)
WBC: 5.6 10*3/uL (ref 4.0–10.5)
nRBC: 0 % (ref 0.0–0.2)

## 2022-07-15 LAB — BASIC METABOLIC PANEL
Anion gap: 8 (ref 5–15)
BUN: 33 mg/dL — ABNORMAL HIGH (ref 8–23)
CO2: 27 mmol/L (ref 22–32)
Calcium: 8.9 mg/dL (ref 8.9–10.3)
Chloride: 104 mmol/L (ref 98–111)
Creatinine, Ser: 1.28 mg/dL — ABNORMAL HIGH (ref 0.44–1.00)
GFR, Estimated: 45 mL/min — ABNORMAL LOW (ref >60)
Glucose, Bld: 48 mg/dL — ABNORMAL LOW (ref 70–99)
Potassium: 5.1 mmol/L (ref 3.5–5.1)
Sodium: 139 mmol/L (ref 135–145)

## 2022-07-15 LAB — TROPONIN I (HIGH SENSITIVITY): Troponin I (High Sensitivity): 17 ng/L (ref <18)

## 2022-07-15 MED ORDER — DEXTROSE 10 % IV BOLUS
250.0000 mL | Freq: Once | INTRAVENOUS | Status: AC
  Administered 2022-07-16: 250 mL via INTRAVENOUS
  Filled 2022-07-15: qty 500

## 2022-07-15 MED ORDER — DEXTROSE 5 % IV BOLUS
250.0000 mL | Freq: Once | INTRAVENOUS | Status: AC
Start: 2022-07-15 — End: 2022-07-16
  Administered 2022-07-15: 250 mL via INTRAVENOUS

## 2022-07-15 NOTE — ED Notes (Signed)
ED Provider at bedside. 

## 2022-07-15 NOTE — ED Triage Notes (Signed)
Per EMS pt had first seizure 2 months ago. EMS was called out today for a seizure lasting approx 30 mins per ALF staff. Staff told EMS that she had a fall 2 days ago but they did not send her to the hospital. Pt is alert and looking at staff and is attempting to take things off herself. However she is not following commands and she has made no verbal noises.

## 2022-07-15 NOTE — ED Provider Notes (Signed)
[facility] Provider Note    Event Date/Time   First MD Initiated Contact with Patient 07/15/22 2203     (approximate)   History   Seizures   HPI  Leah Ritter is a 71 y.o. female   Past medical history of atrial fibrillation on Xarelto, CHF, anxiety, hypertension, prior stroke, diabetes who presents with seizure-like activity and urinary incontinence from her skilled nursing facility.  At baseline, daughter states that the patient is disoriented but can hold a conversation is ambulatory with walker.  Over the last 1 to 2 days she has become increasingly more tired appearing and will not eat or get out of bed.  Reportedly had seizure-like activity in her bed today with urinary incontinence and has been minimally responsive.  Daughter states that the patient had been minimally responsive yesterday prior to the seizure-like activity as well.  Daughter states that there was concern that this patient had a seizure during her admission in March 2023, review of that discharge summary states that she had acute metabolic encephalopathy possible aspiration pneumonia and possible UTI with an EEG that did not show any epileptiform activity.   History was obtained via the patient's daughter and a review of external medical notes including discharge summary in March 2023      Physical Exam   Triage Vital Signs: ED Triage Vitals  Enc Vitals Group     BP 07/15/22 2209 134/85     Pulse Rate 07/15/22 2209 84     Resp 07/15/22 2209 18     Temp 07/15/22 2222 (!) 94.7 F (34.8 C)     Temp Source 07/15/22 2222 Rectal     SpO2 07/15/22 2209 99 %     Weight --      Height --      Head Circumference --      Peak Flow --      Pain Score --      Pain Loc --      Pain Edu? --      Excl. in [place]? --     Most recent vital signs: Vitals:   07/15/22 2230 07/15/22 2300  BP: 119/71 125/77  Pulse: 74 67  Resp: 15 14  Temp:    SpO2: 96% 96%    General: Opens eyes to  trap squeeze, otherwise is not responding to commands but is moving all extremities CV:  Good peripheral perfusion.  Resp:  Normal effort.  Clear breath sounds Abd:  No distention.  Nontender to palpation without rigidity   ED Results / Procedures / Treatments   Labs (all labs ordered are listed, but only abnormal results are displayed) Labs Reviewed  CBC WITH DIFFERENTIAL/PLATELET - Abnormal; Notable for the following components:      Result Value   Hemoglobin 11.9 (*)    All other components within normal limits  BASIC METABOLIC PANEL - Abnormal; Notable for the following components:   Glucose, Bld 48 (*)    BUN 33 (*)    Creatinine, Ser 1.28 (*)    GFR, Estimated 45 (*)    All other components within normal limits  URINALYSIS, COMPLETE (UACMP) WITH MICROSCOPIC - Abnormal; Notable for the following components:   Color, Urine YELLOW (*)    APPearance HAZY (*)    Glucose, UA >=500 (*)    Nitrite POSITIVE (*)    Leukocytes,Ua SMALL (*)    Bacteria, UA RARE (*)    All other components within normal limits  URINE  CULTURE  LACTIC ACID, PLASMA  LACTIC ACID, PLASMA  TROPONIN I (HIGH SENSITIVITY)     I reviewed labs and they are notable for hemoglobin 11.9, normal white blood cell count, glucose 48  EKG  ED ECG REPORT I, Pilar Jarvis, the attending physician, personally viewed and interpreted this ECG.   Date: 07/15/2022  EKG Time: 2218  Rate: 66  Rhythm: normal EKG, normal sinus rhythm  Axis: nl  Intervals: no stemi    RADIOLOGY I independently reviewed and interpreted CT of the head and see no obvious hemorrhage or midline shift   PROCEDURES:  Critical Care performed: No  Procedures   MEDICATIONS ORDERED IN ED: Medications  dextrose (D10W) 10% bolus 250 mL (250 mLs Intravenous New Bag/Given 07/16/22 0009)  dextrose 5 % bolus 250 mL (0 mLs Intravenous Stopped 07/16/22 0010)     IMPRESSION / MDM / ASSESSMENT AND PLAN / ED COURSE  I reviewed the triage  vital signs and the nursing notes.                              Differential diagnosis includes, but is not limited to, seizures, hypoglycemia, infection, head bleed, stroke    MDM: Patient with seizure-like activity altered mental status and minimally responsive found to be hypoglycemic in the 40s, most likely etiology though will assess for head bleed and infection metabolic derangements as well.  Given dextrose bolus and will reassess.  Signed out to oncoming provider   Patient's presentation is most consistent with acute presentation with potential threat to life or bodily function.       FINAL CLINICAL IMPRESSION(S) / ED DIAGNOSES   Final diagnoses:  None     Rx / DC Orders   ED Discharge Orders     None        Note:  This document was prepared using Dragon voice recognition software and may include unintentional dictation errors.    Pilar Jarvis, MD 07/16/22 Jacinta Shoe

## 2022-07-15 NOTE — ED Notes (Addendum)
Daughter just arrived and was able to confirm pt's allergies. Sts pt has been non-verbal for the last 2 days.

## 2022-07-15 NOTE — ED Notes (Signed)
Patient transported to CT 

## 2022-07-16 ENCOUNTER — Encounter: Payer: Self-pay | Admitting: Family Medicine

## 2022-07-16 ENCOUNTER — Other Ambulatory Visit: Payer: Self-pay

## 2022-07-16 ENCOUNTER — Inpatient Hospital Stay: Payer: Medicare Other

## 2022-07-16 DIAGNOSIS — E1165 Type 2 diabetes mellitus with hyperglycemia: Secondary | ICD-10-CM | POA: Diagnosis not present

## 2022-07-16 DIAGNOSIS — Z66 Do not resuscitate: Secondary | ICD-10-CM | POA: Diagnosis present

## 2022-07-16 DIAGNOSIS — Z8249 Family history of ischemic heart disease and other diseases of the circulatory system: Secondary | ICD-10-CM | POA: Diagnosis not present

## 2022-07-16 DIAGNOSIS — R569 Unspecified convulsions: Secondary | ICD-10-CM | POA: Diagnosis not present

## 2022-07-16 DIAGNOSIS — F03918 Unspecified dementia, unspecified severity, with other behavioral disturbance: Secondary | ICD-10-CM

## 2022-07-16 DIAGNOSIS — Z803 Family history of malignant neoplasm of breast: Secondary | ICD-10-CM | POA: Diagnosis not present

## 2022-07-16 DIAGNOSIS — I5032 Chronic diastolic (congestive) heart failure: Secondary | ICD-10-CM | POA: Diagnosis present

## 2022-07-16 DIAGNOSIS — I480 Paroxysmal atrial fibrillation: Secondary | ICD-10-CM

## 2022-07-16 DIAGNOSIS — N390 Urinary tract infection, site not specified: Secondary | ICD-10-CM

## 2022-07-16 DIAGNOSIS — A4150 Gram-negative sepsis, unspecified: Principal | ICD-10-CM

## 2022-07-16 DIAGNOSIS — I10 Essential (primary) hypertension: Secondary | ICD-10-CM | POA: Diagnosis not present

## 2022-07-16 DIAGNOSIS — Z823 Family history of stroke: Secondary | ICD-10-CM | POA: Diagnosis not present

## 2022-07-16 DIAGNOSIS — I959 Hypotension, unspecified: Secondary | ICD-10-CM | POA: Diagnosis present

## 2022-07-16 DIAGNOSIS — Z841 Family history of disorders of kidney and ureter: Secondary | ICD-10-CM | POA: Diagnosis not present

## 2022-07-16 DIAGNOSIS — N39498 Other specified urinary incontinence: Secondary | ICD-10-CM | POA: Diagnosis present

## 2022-07-16 DIAGNOSIS — F419 Anxiety disorder, unspecified: Secondary | ICD-10-CM | POA: Diagnosis present

## 2022-07-16 DIAGNOSIS — I459 Conduction disorder, unspecified: Secondary | ICD-10-CM | POA: Diagnosis present

## 2022-07-16 DIAGNOSIS — F01518 Vascular dementia, unspecified severity, with other behavioral disturbance: Secondary | ICD-10-CM | POA: Diagnosis present

## 2022-07-16 DIAGNOSIS — Z7901 Long term (current) use of anticoagulants: Secondary | ICD-10-CM | POA: Diagnosis not present

## 2022-07-16 DIAGNOSIS — E11649 Type 2 diabetes mellitus with hypoglycemia without coma: Secondary | ICD-10-CM | POA: Diagnosis not present

## 2022-07-16 DIAGNOSIS — X58XXXA Exposure to other specified factors, initial encounter: Secondary | ICD-10-CM | POA: Diagnosis present

## 2022-07-16 DIAGNOSIS — S01551A Open bite of lip, initial encounter: Secondary | ICD-10-CM | POA: Diagnosis present

## 2022-07-16 DIAGNOSIS — T68XXXA Hypothermia, initial encounter: Secondary | ICD-10-CM | POA: Diagnosis present

## 2022-07-16 DIAGNOSIS — G928 Other toxic encephalopathy: Secondary | ICD-10-CM | POA: Diagnosis present

## 2022-07-16 DIAGNOSIS — Z8673 Personal history of transient ischemic attack (TIA), and cerebral infarction without residual deficits: Secondary | ICD-10-CM | POA: Diagnosis not present

## 2022-07-16 DIAGNOSIS — E785 Hyperlipidemia, unspecified: Secondary | ICD-10-CM

## 2022-07-16 DIAGNOSIS — I110 Hypertensive heart disease with heart failure: Secondary | ICD-10-CM | POA: Diagnosis present

## 2022-07-16 LAB — BASIC METABOLIC PANEL
Anion gap: 6 (ref 5–15)
BUN: 32 mg/dL — ABNORMAL HIGH (ref 8–23)
CO2: 28 mmol/L (ref 22–32)
Calcium: 8.6 mg/dL — ABNORMAL LOW (ref 8.9–10.3)
Chloride: 103 mmol/L (ref 98–111)
Creatinine, Ser: 1.15 mg/dL — ABNORMAL HIGH (ref 0.44–1.00)
GFR, Estimated: 51 mL/min — ABNORMAL LOW (ref >60)
Glucose, Bld: 226 mg/dL — ABNORMAL HIGH (ref 70–99)
Potassium: 3.9 mmol/L (ref 3.5–5.1)
Sodium: 137 mmol/L (ref 135–145)

## 2022-07-16 LAB — CBC
HCT: 32.4 % — ABNORMAL LOW (ref 36.0–46.0)
Hemoglobin: 10.1 g/dL — ABNORMAL LOW (ref 12.0–15.0)
MCH: 28.1 pg (ref 26.0–34.0)
MCHC: 31.2 g/dL (ref 30.0–36.0)
MCV: 90 fL (ref 80.0–100.0)
Platelets: 154 10*3/uL (ref 150–400)
RBC: 3.6 MIL/uL — ABNORMAL LOW (ref 3.87–5.11)
RDW: 14.9 % (ref 11.5–15.5)
WBC: 4.7 10*3/uL (ref 4.0–10.5)
nRBC: 0 % (ref 0.0–0.2)

## 2022-07-16 LAB — URINALYSIS, COMPLETE (UACMP) WITH MICROSCOPIC
Bilirubin Urine: NEGATIVE
Glucose, UA: >=500 mg/dL — AB
Hgb urine dipstick: NEGATIVE
Ketones, ur: NEGATIVE mg/dL
Nitrite: POSITIVE — AB
Protein, ur: NEGATIVE mg/dL
Specific Gravity, Urine: 1.011 (ref 1.005–1.030)
Squamous Epithelial / HPF: NONE SEEN (ref 0–5)
pH: 7 (ref 5.0–8.0)

## 2022-07-16 LAB — CBG MONITORING, ED
Glucose-Capillary: 130 mg/dL — ABNORMAL HIGH (ref 70–99)
Glucose-Capillary: 79 mg/dL (ref 70–99)
Glucose-Capillary: 54 mg/dL — ABNORMAL LOW (ref 70–99)
Glucose-Capillary: 58 mg/dL — ABNORMAL LOW (ref 70–99)
Glucose-Capillary: 52 mg/dL — ABNORMAL LOW (ref 70–99)
Glucose-Capillary: 184 mg/dL — ABNORMAL HIGH (ref 70–99)
Glucose-Capillary: 69 mg/dL — ABNORMAL LOW (ref 70–99)
Glucose-Capillary: 69 mg/dL — ABNORMAL LOW (ref 70–99)

## 2022-07-16 LAB — LACTIC ACID, PLASMA
Lactic Acid, Venous: 1.1 mmol/L (ref 0.5–1.9)
Lactic Acid, Venous: 1.9 mmol/L (ref 0.5–1.9)

## 2022-07-16 LAB — HEMOGLOBIN A1C
Hgb A1c MFr Bld: 8.4 % — ABNORMAL HIGH (ref 4.8–5.6)
Mean Plasma Glucose: 194.38 mg/dL

## 2022-07-16 LAB — TROPONIN I (HIGH SENSITIVITY): Troponin I (High Sensitivity): 20 ng/L — ABNORMAL HIGH (ref <18)

## 2022-07-16 LAB — TSH: TSH: 4.31 u[IU]/mL (ref 0.350–4.500)

## 2022-07-16 MED ORDER — BISACODYL 10 MG RE SUPP: 10.0000 mg | RECTAL | Status: DC | PRN

## 2022-07-16 MED ORDER — LEVETIRACETAM IN NACL 500 MG/100ML IV SOLN
500.0000 mg | Freq: Two times a day (BID) | INTRAVENOUS | Status: DC
  Administered 2022-07-16 – 2022-07-20 (×8): 500 mg via INTRAVENOUS
  Filled 2022-07-16 (×9): qty 100

## 2022-07-16 MED ORDER — SODIUM CHLORIDE 0.9 % IV SOLN
2.0000 g | INTRAVENOUS | Status: DC
  Administered 2022-07-16 – 2022-07-19 (×4): 2 g via INTRAVENOUS
  Filled 2022-07-16: qty 2
  Filled 2022-07-16 (×3): qty 20
  Filled 2022-07-16: qty 2

## 2022-07-16 MED ORDER — INSULIN ASPART 100 UNIT/ML IJ SOLN: 0.0000 [IU] | Freq: Every day | INTRAMUSCULAR | Status: DC

## 2022-07-16 MED ORDER — INSULIN ASPART 100 UNIT/ML IJ SOLN
0.0000 [IU] | Freq: Three times a day (TID) | INTRAMUSCULAR | Status: DC
  Administered 2022-07-18 – 2022-07-19 (×3): 1 [IU] via SUBCUTANEOUS
  Administered 2022-07-21: 2 [IU] via SUBCUTANEOUS
  Filled 2022-07-16 (×3): qty 1

## 2022-07-16 MED ORDER — TRAMADOL HCL 50 MG PO TABS: 50.0000 mg | ORAL_TABLET | Freq: Three times a day (TID) | ORAL | Status: DC | PRN

## 2022-07-16 MED ORDER — DEXTROSE 50 % IV SOLN: 50.0000 mL | Freq: Once | INTRAVENOUS | Status: AC

## 2022-07-16 MED ORDER — ONDANSETRON HCL 4 MG PO TABS: 4.0000 mg | ORAL_TABLET | Freq: Four times a day (QID) | ORAL | Status: DC | PRN

## 2022-07-16 MED ORDER — AMLODIPINE BESYLATE 5 MG PO TABS
5.0000 mg | ORAL_TABLET | Freq: Every day | ORAL | Status: DC
  Administered 2022-07-18 – 2022-07-21 (×4): 5 mg via ORAL
  Filled 2022-07-16 (×4): qty 1

## 2022-07-16 MED ORDER — ACETAMINOPHEN 325 MG PO TABS: 650.0000 mg | ORAL_TABLET | Freq: Four times a day (QID) | ORAL | Status: DC | PRN

## 2022-07-16 MED ORDER — ENOXAPARIN SODIUM 60 MG/0.6ML IJ SOSY
0.5000 mg/kg | PREFILLED_SYRINGE | INTRAMUSCULAR | Status: DC
  Administered 2022-07-17 – 2022-07-21 (×5): 47.5 mg via SUBCUTANEOUS
  Filled 2022-07-16 (×5): qty 0.6

## 2022-07-16 MED ORDER — ENSURE ENLIVE PO LIQD
237.0000 mL | Freq: Three times a day (TID) | ORAL | Status: DC
  Administered 2022-07-18 – 2022-07-21 (×7): 237 mL via ORAL

## 2022-07-16 MED ORDER — TRAZODONE HCL 50 MG PO TABS: 25.0000 mg | ORAL_TABLET | Freq: Every evening | ORAL | Status: DC | PRN

## 2022-07-16 MED ORDER — ACETAMINOPHEN 650 MG RE SUPP: 650.0000 mg | Freq: Four times a day (QID) | RECTAL | Status: DC | PRN

## 2022-07-16 MED ORDER — EMPAGLIFLOZIN 10 MG PO TABS
10.0000 mg | ORAL_TABLET | Freq: Every day | ORAL | Status: DC
  Filled 2022-07-16 (×2): qty 1

## 2022-07-16 MED ORDER — ENOXAPARIN SODIUM 60 MG/0.6ML IJ SOSY: 0.5000 mg/kg | PREFILLED_SYRINGE | INTRAMUSCULAR | Status: DC

## 2022-07-16 MED ORDER — ONDANSETRON HCL 4 MG/2ML IJ SOLN: 4.0000 mg | Freq: Four times a day (QID) | INTRAMUSCULAR | Status: DC | PRN

## 2022-07-16 MED ORDER — MIRTAZAPINE 15 MG PO TBDP
15.0000 mg | ORAL_TABLET | Freq: Every day | ORAL | Status: DC
  Administered 2022-07-17 – 2022-07-20 (×4): 15 mg via ORAL
  Filled 2022-07-16 (×5): qty 1

## 2022-07-16 MED ORDER — DEXTROSE 50 % IV SOLN
INTRAVENOUS | Status: AC
  Administered 2022-07-16: 50 mL
  Filled 2022-07-16: qty 50

## 2022-07-16 MED ORDER — MAGNESIUM HYDROXIDE 400 MG/5ML PO SUSP
30.0000 mL | Freq: Every day | ORAL | Status: DC | PRN
  Administered 2022-07-19: 30 mL via ORAL
  Filled 2022-07-16: qty 30

## 2022-07-16 MED ORDER — DEXTROSE-NACL 5-0.9 % IV SOLN: INTRAVENOUS | Status: DC

## 2022-07-16 MED ORDER — QUETIAPINE FUMARATE 25 MG PO TABS
25.0000 mg | ORAL_TABLET | Freq: Every day | ORAL | Status: DC
  Administered 2022-07-17 – 2022-07-20 (×4): 25 mg via ORAL
  Filled 2022-07-16 (×4): qty 1

## 2022-07-16 MED ORDER — LORAZEPAM 2 MG/ML IJ SOLN
INTRAMUSCULAR | Status: AC
  Filled 2022-07-16: qty 1

## 2022-07-16 MED ORDER — DEXTROSE 50 % IV SOLN
1.0000 | Freq: Once | INTRAVENOUS | Status: AC
  Administered 2022-07-16: 50 mL via INTRAVENOUS
  Filled 2022-07-16: qty 50

## 2022-07-16 MED ORDER — MORPHINE SULFATE (CONCENTRATE) 10 MG/0.5ML PO SOLN: 20.0000 mg | ORAL | Status: DC | PRN

## 2022-07-16 MED ORDER — LORAZEPAM 2 MG/ML IJ SOLN: 1.0000 mg | INTRAMUSCULAR | Status: DC | PRN

## 2022-07-16 MED ORDER — LEVETIRACETAM IN NACL 1000 MG/100ML IV SOLN
INTRAVENOUS | Status: AC
  Administered 2022-07-16: 1000 mg via INTRAVENOUS
  Filled 2022-07-16: qty 100

## 2022-07-16 MED ORDER — ATORVASTATIN CALCIUM 20 MG PO TABS
80.0000 mg | ORAL_TABLET | Freq: Every day | ORAL | Status: DC
  Administered 2022-07-18 – 2022-07-20 (×2): 80 mg via ORAL
  Filled 2022-07-16 (×2): qty 4

## 2022-07-16 MED ORDER — OXYBUTYNIN CHLORIDE 5 MG PO TABS
5.0000 mg | ORAL_TABLET | Freq: Every day | ORAL | Status: DC
  Administered 2022-07-18 – 2022-07-21 (×4): 5 mg via ORAL
  Filled 2022-07-16 (×6): qty 1

## 2022-07-16 MED ORDER — LORAZEPAM 0.5 MG PO TABS: 0.5000 mg | ORAL_TABLET | ORAL | Status: DC | PRN

## 2022-07-16 MED ORDER — LEVETIRACETAM IN NACL 1000 MG/100ML IV SOLN: 1000.0000 mg | Freq: Once | INTRAVENOUS | Status: AC

## 2022-07-16 MED ORDER — SODIUM CHLORIDE 0.9 % IV SOLN: INTRAVENOUS | Status: DC

## 2022-07-16 MED ORDER — SODIUM CHLORIDE 0.9 % IV SOLN
1.0000 g | Freq: Once | INTRAVENOUS | Status: AC
  Administered 2022-07-16: 1 g via INTRAVENOUS
  Filled 2022-07-16: qty 10

## 2022-07-16 NOTE — Progress Notes (Signed)
Same-day progress note EEG with triphasics-likely toxic metabolic encephalopathy Continue plan as before   -- Amie Portland, MD Neurologist [facility] Pager: [phone]

## 2022-07-16 NOTE — Progress Notes (Signed)
PHARMACIST - PHYSICIAN COMMUNICATION  CONCERNING:  Enoxaparin (Lovenox) for DVT Prophylaxis    RECOMMENDATION: Patient was prescribed enoxaprin 40mg  q24 hours for VTE prophylaxis.   Filed Weights   07/16/22 0230  Weight: 96 kg (211 lb 10.3 oz)    Body mass index is 31.25 kg/m.  Estimated Creatinine Clearance: 49 mL/min (A) (by C-G formula based on SCr of 1.28 mg/dL (H)).   Based on [place] patient is candidate for enoxaparin 0.5mg /kg TBW SQ every 24 hours based on BMI being >30.  DESCRIPTION: Pharmacy has adjusted enoxaparin dose per [facility] policy.  Patient is now receiving enoxaparin 0.5 mg/kg every 24 hours   Renda Rolls, PharmD, [facility] 07/16/2022 3:47 AM

## 2022-07-16 NOTE — ED Notes (Signed)
Patient restless, kicking legs around in bed. Daughter at bedside. Noted no urine in suction canister from purewick. Brief is dry as well. Bladder scan done and greater than 625ml noted. Dr. Priscella Mann informed for order for foley catheter

## 2022-07-16 NOTE — Assessment & Plan Note (Signed)
-   We will continue statin therapy. 

## 2022-07-16 NOTE — ED Notes (Signed)
While this RN was at the bedside placing bair hugger on the pt, pt appeared to have a seizure that lasted approx 45 seconds. Pt appears to have bit her tongue as frank blood was noted at the mouth and NRB was placed on pt. This RN called for assistance and MD and another RN came to the bedside. New orders were placed.

## 2022-07-16 NOTE — Assessment & Plan Note (Addendum)
-   The patient will be admitted to medical telemetry bed. - She will be placed on seizures precautions. - We will obtain an EEG. - We will obtain a neurology consult. - I notified Dr. Rory Percy about the patient. - She was given IV Keppra will be placed on as needed IV Ativan. - We will manage hypoglycemia is likely the main culprit for her seizures.

## 2022-07-16 NOTE — Progress Notes (Signed)
Brief hospitalist update note.  This is a nonbillable note.  Please see same-day H&P for full billable details.  Briefly, this is a 71 year old female history significant for paroxysmal atrial fibrillation, anxiety, congestive heart failure, type 2 diabetes mellitus, GERD, hypertension, CVA, OSA presents the ED with acute onset of shaking activity at home concerning for seizures.  Described by family as clenching fist with both arms and eyes rolling backwards, not responding followed by episode of confusion.  Blood glucose in 40s on EMS arrival.  Similar episode emergency room.  Patient was found to be hypothermic and mildly hypotensive.  Started on intravenous Rocephin for suspected UTI.  Received extra containing fluids.  Started on Keppra.  Discussed with neurology.  Continue Keppra at this time.  Routine EEG and MRI ordered.  CBGs every 4 hours.  Ralene Muskrat MD  No charge

## 2022-07-16 NOTE — ED Notes (Signed)
Pt placed in mits and music turned on to help with agitation

## 2022-07-16 NOTE — Consult Note (Signed)
Neurology Consultation  Reason for Consult: seizures Referring Physician:  Dr Sidney Ace  CC: Seizures  History is obtained from: Chart, patient's daughter  HPI: Leah Ritter is a 71 y.o. female past medical history of A-fib on Xarelto, CHF, anxiety, hypertension, diabetes, dementia likely vascular in etiology from prior strokes, at baseline ambulatory with a walker, over the past couple of days became increasingly more fatigued and less responsive and had an episode at the facility for about 20 to 30-minute concerning for seizure.  Had another episode in the ER concerning for whole body generalized and clonic seizure. Was seen in March for similar episode of concern for seizure.  EEG was unremarkable.  Brain imaging was unremarkable for acute process. She had been evaluated by [facility] neurology a couple of years ago and had MMSE of 18/30.  During yesterday's presentation, she was found to be hypoglycemic with sugar in the 40s along with evidence of possible UTI.  Admitted to medicine for further management.  Neurological consultation obtained for the seizures   LKW: 2 to 3 days ago IV thrombolysis given?: no, out the window Premorbid modified Rankin scale (mRS): 4   ROS: Unable to obtain due to altered mental status.   Past Medical History:  Diagnosis Date   A-fib ([place])    Anxiety    CHF (congestive heart failure) (HCC)    Diabetes (HCC)    GERD (gastroesophageal reflux disease)    Hypertension    Sleep apnea    Stroke [facility])      Family History  Problem Relation Age of Onset   Breast cancer Mother    Stroke Father    Hypertension Father    Kidney disease Brother    Cancer Brother      Social History:   reports that she has never smoked. She has never used smokeless tobacco. She reports that she does not currently use alcohol. She reports that she does not use drugs.  Medications  Current Facility-Administered Medications:    0.9 %  sodium chloride infusion, , Intravenous,  Continuous, Mansy, Jan A, MD, Last Rate: 100 mL/hr at 07/16/22 0407, New Bag at 07/16/22 0407   acetaminophen (TYLENOL) tablet 650 mg, 650 mg, Oral, Q6H PRN **OR** acetaminophen (TYLENOL) suppository 650 mg, 650 mg, Rectal, Q6H PRN, Mansy, Jan A, MD   amLODipine (NORVASC) tablet 5 mg, 5 mg, Oral, Daily, Mansy, Jan A, MD   atorvastatin (LIPITOR) tablet 80 mg, 80 mg, Oral, q1800, Mansy, Jan A, MD   bisacodyl (DULCOLAX) suppository 10 mg, 10 mg, Rectal, PRN, Mansy, Jan A, MD   cefTRIAXone (ROCEPHIN) 2 g in sodium chloride 0.9 % 100 mL IVPB, 2 g, Intravenous, Q24H, Mansy, Jan A, MD   empagliflozin (JARDIANCE) tablet 10 mg, 10 mg, Oral, Daily, Mansy, Jan A, MD   enoxaparin (LOVENOX) injection 47.5 mg, 0.5 mg/kg, Subcutaneous, Q24H, Mansy, Jan A, MD   feeding supplement (ENSURE ENLIVE / ENSURE PLUS) liquid 237 mL, 237 mL, Oral, TID BM, Mansy, Jan A, MD   insulin aspart (novoLOG) injection 0-5 Units, 0-5 Units, Subcutaneous, QHS, Mansy, Jan A, MD   insulin aspart (novoLOG) injection 0-6 Units, 0-6 Units, Subcutaneous, TID WC, Mansy, Jan A, MD   LORazepam (ATIVAN) 2 MG/ML injection, , , ,    LORazepam (ATIVAN) injection 1 mg, 1 mg, Intravenous, Q1H PRN, Mansy, Jan A, MD   LORazepam (ATIVAN) tablet 0.5 mg, 0.5 mg, Oral, Q4H PRN, Mansy, Jan A, MD   magnesium hydroxide (MILK OF MAGNESIA) suspension 30 mL,  30 mL, Oral, Daily PRN, Mansy, Jan A, MD   mirtazapine (REMERON SOL-TAB) disintegrating tablet 15 mg, 15 mg, Oral, QHS, Mansy, Jan A, MD   morphine CONCENTRATE 10 MG/0.5ML oral solution 20 mg, 20 mg, Oral, Q1H PRN, Mansy, Jan A, MD   ondansetron (ZOFRAN) tablet 4 mg, 4 mg, Oral, Q6H PRN **OR** ondansetron (ZOFRAN) injection 4 mg, 4 mg, Intravenous, Q6H PRN, Mansy, Jan A, MD   oxybutynin (DITROPAN) tablet 5 mg, 5 mg, Oral, Daily, Mansy, Jan A, MD   QUEtiapine (SEROQUEL) tablet 25 mg, 25 mg, Oral, QHS, Sreenath, Sudheer B, MD   traMADol (ULTRAM) tablet 50 mg, 50 mg, Oral, Q8H PRN, Mansy, Jan A, MD    traZODone (DESYREL) tablet 25 mg, 25 mg, Oral, QHS PRN, Mansy, Jan A, MD  Current Outpatient Medications:    amLODipine (NORVASC) 5 MG tablet, Take 1 tablet (5 mg total) by mouth daily., Disp: , Rfl:    atorvastatin (LIPITOR) 80 MG tablet, Take 80 mg by mouth daily at 6 PM., Disp: , Rfl:    bisacodyl (DULCOLAX) 10 MG suppository, Place 10 mg rectally as needed for moderate constipation., Disp: , Rfl:    CONTOUR NEXT TEST test strip, 1 each by Other route every evening., Disp: , Rfl: 4   empagliflozin (JARDIANCE) 10 MG TABS tablet, Take 10 mg by mouth daily., Disp: , Rfl:    feeding supplement (ENSURE ENLIVE / ENSURE PLUS) LIQD, Take 237 mLs by mouth 3 (three) times daily between meals., Disp: , Rfl:    furosemide (LASIX) 20 MG tablet, Take 30 mg by mouth daily., Disp: , Rfl:    glipiZIDE (GLUCOTROL) 10 MG tablet, Take 10 mg by mouth 2 (two) times daily before a meal. , Disp: , Rfl:    ibuprofen (ADVIL) 600 MG tablet, Take 600 mg by mouth every 6 (six) hours as needed., Disp: , Rfl:    ketoconazole (NIZORAL) 2 % shampoo, Apply 1 Application topically 2 (two) times a week., Disp: , Rfl:    LORazepam (ATIVAN) 0.5 MG tablet, Take 0.5 mg by mouth every 4 (four) hours as needed for seizure., Disp: , Rfl:    mirtazapine (REMERON SOL-TAB) 15 MG disintegrating tablet, Take 1 tablet (15 mg total) by mouth at bedtime., Disp: , Rfl:    Morphine Sulfate (MORPHINE CONCENTRATE) 10 mg / 0.5 ml concentrated solution, Take 20 mg by mouth every hour as needed for severe pain or shortness of breath., Disp: , Rfl:    NON FORMULARY, 2 (two) times daily as needed., Disp: , Rfl:    oxybutynin (DITROPAN) 5 MG tablet, Take 5 mg by mouth daily., Disp: , Rfl:    QUEtiapine (SEROQUEL) 50 MG tablet, Take 25 mg by mouth at bedtime., Disp: , Rfl:    traMADol (ULTRAM) 50 MG tablet, Take 50 mg by mouth every 8 (eight) hours as needed., Disp: , Rfl:    ALPRAZolam (XANAX) 0.25 MG tablet, Take 1 tablet (0.25 mg total) by mouth 2  (two) times daily as needed (for agitation). (Patient not taking: Reported on 07/16/2022), Disp: 30 tablet, Rfl: 0   aspirin EC 81 MG tablet, Take 81 mg by mouth daily. (Patient not taking: Reported on 07/16/2022), Disp: , Rfl:    metoprolol tartrate (LOPRESSOR) 50 MG tablet, Take 75 mg by mouth daily. (Patient not taking: Reported on 07/16/2022), Disp: , Rfl:    Multiple Vitamin (MULTIVITAMIN WITH MINERALS) TABS tablet, Take 1 tablet by mouth daily. (Patient not taking: Reported on 07/16/2022), Disp: ,  Rfl:    MYRBETRIQ 25 MG TB24 tablet, Take 25 mg by mouth daily. (Patient not taking: Reported on 07/16/2022), Disp: , Rfl:    nystatin cream (MYCOSTATIN), Apply 1 application. topically 2 (two) times daily. Apply to red rash under breasts twice a day until healed. (Patient not taking: Reported on 07/16/2022), Disp: , Rfl:    polyethylene glycol (MIRALAX / GLYCOLAX) 17 g packet, Take 17 g by mouth daily as needed for moderate constipation. (Patient not taking: Reported on 07/16/2022), Disp: , Rfl:    Vitamin D, Ergocalciferol, 2000 units CAPS, Take 2,000 Units by mouth every morning. (Patient not taking: Reported on 12/10/2021), Disp: , Rfl:    Vitamin D3 (VITAMIN D) 25 MCG tablet, Take 2,000 Units by mouth daily. (Patient not taking: Reported on 07/16/2022), Disp: , Rfl:    XARELTO 20 MG TABS tablet, Take 20 mg by mouth daily. (Patient not taking: Reported on 07/16/2022), Disp: , Rfl:   Exam: Current vital signs: BP 92/61   Pulse 76   Temp (!) 95.1 F (35.1 C) (Rectal)   Resp 15   Ht 5\' 9"  (1.753 m)   Wt 96 kg   SpO2 100%   BMI 31.25 kg/m  Vital signs in last 24 hours: Temp:  [94.1 F (34.5 C)-95.1 F (35.1 C)] 95.1 F (35.1 C) (10/20 0628) Pulse Rate:  [62-84] 76 (10/20 0500) Resp:  [13-21] 15 (10/20 0500) BP: (92-148)/(61-88) 92/61 (10/20 0500) SpO2:  [96 %-100 %] 100 % (10/20 0500) Weight:  [96 kg] 96 kg (10/20 0230) General: Extremely drowsy, does not open eyes to voice but  actively resists eye opening HEENT: Normocephalic atraumatic Lungs: Clear Cardiovascular: Regular rhythm Abdomen: Obese Neurological exam She is extremely drowsy, does not open her eyes, actively resists eye opening Pupils equal round reactive light, does not blink to threat from either side. No gaze deviation or preference Face appears symmetric but has her teeth clenched at this time. No spontaneous movement noted To noxious stimulation, mumbles "taken away" but does not actively reach for my hand.  She does withdraw lower extremities to some extent to noxious stimulation-mostly symmetrically. Cannot assess her gait or coordination given her mental status  NIHSS 1a Level of Conscious.: 2 1b LOC Questions: 2 1c LOC Commands: 2 2 Best Gaze: 0 3 Visual: 0 4 Facial Palsy: 0 5a Motor Arm - left: 3 5b Motor Arm - Right: 3 6a Motor Leg - Left: 3 6b Motor Leg - Right: 3 7 Limb Ataxia: 0 8 Sensory: 0 9 Best Language: 2 10 Dysarthria: 2 11 Extinct. and Inatten.: 0 TOTAL: 22   Labs I have reviewed labs in epic and the results pertinent to this consultation are: CBC    Component Value Date/Time   WBC 4.7 07/16/2022 0403   RBC 3.60 (L) 07/16/2022 0403   HGB 10.1 (L) 07/16/2022 0403   HCT 32.4 (L) 07/16/2022 0403   PLT 154 07/16/2022 0403   MCV 90.0 07/16/2022 0403   MCH 28.1 07/16/2022 0403   MCHC 31.2 07/16/2022 0403   RDW 14.9 07/16/2022 0403   LYMPHSABS 0.7 07/15/2022 2229   MONOABS 0.3 07/15/2022 2229   EOSABS 0.1 07/15/2022 2229   BASOSABS 0.0 07/15/2022 2229    CMP     Component Value Date/Time   NA 137 07/16/2022 0403   K 3.9 07/16/2022 0403   CL 103 07/16/2022 0403   CO2 28 07/16/2022 0403   GLUCOSE 226 (H) 07/16/2022 0403   BUN 32 (H) 07/16/2022 0403  CREATININE 1.15 (H) 07/16/2022 0403   CALCIUM 8.6 (L) 07/16/2022 0403   PROT 7.6 12/15/2021 0411   ALBUMIN 3.4 (L) 12/15/2021 0411   AST 47 (H) 12/15/2021 0411   ALT 49 (H) 12/15/2021 0411   ALKPHOS 106  12/15/2021 0411   BILITOT 0.5 12/15/2021 0411   GFRNONAA 51 (L) 07/16/2022 0403   GFRAA >60 01/10/2019 0302   Urinalysis with small leukocyte Estrace, positive nitrite, 11-20 WBCs, rare bacteria.  Glucose greater than 500.  Imaging I have reviewed the images obtained: CT head: No acute changes-chronic right frontal infarction unchanged from prior scan.  Chronic small vessel disease Chest x-ray with mild vascular congestion  Assessment:  71 year old with vascular dementia and other risk factors presenting for evaluation of episode of whole body stiffening concerning for generalized seizure-this is the second time that seizure activity is happened-first time was in March with normal EEG and imaging.  This time around she has had 2 episodes.  Also of note is that she was hypoglycemic and also has UTI along with this presentation. Difficult to ascertain whether this is a provoked seizure or not. In any case, given her history of dementia and multiplicity of events, I think a short duration treatment with antiepileptics is warranted. She was started on Keppra during this hospitalization. I will recommend continuing Keppra for now but given her history of agitation, consideration outpatient might have to be made changes to either Lamictal or another alternative with the caveat that an antiepileptic that does not interact with DOAC has to be used.  Impression: Possible new onset seizures Toxic metabolic encephalopathy in the setting of UTI History of vascular dementia Evaluate for any evidence of stroke given 2-day history of less verbal output although might all be related to toxic metabolic encephalopathy  Recommendations: Continue Keppra at this time at 500 mg twice daily. Maintain seizure precautions Try to minimize sedating medications as much as possible. Blood pressure and blood glucose management per primary team as you are. Treatment of the UTI per primary team as you are Routine  EEG-technologist has been notified MRI brain without contrast to rule out stroke Will follow Plan discussed with Dr. Georgeann Oppenheim  -- Milon Dikes, MD Neurologist [facility] Pager: [phone]

## 2022-07-16 NOTE — ED Notes (Signed)
ED Provider at bedside. 

## 2022-07-16 NOTE — Assessment & Plan Note (Addendum)
-   The patient will be placed on very sensitive supplement coverage with NovoLog. - We will continue Jardiance and hold off Glucotrol and metformin.

## 2022-07-16 NOTE — Assessment & Plan Note (Signed)
-   This could be contributing to diminished appetite and subsequent hypoglycemia. - She will be placed on IV Rocephin. - We will follow blood and urine cultures. - Sepsis manifested by hypothermia and respiratory rate of 21 in the setting of UTI. - We will continue hydration with D5 normal saline.

## 2022-07-16 NOTE — Procedures (Signed)
Patient Name: Leah Ritter  MRN: [id number]  Epilepsy Attending: Lora Havens  Referring Physician/Provider: Christel Mormon, MD Date: 07/16/2022 Duration: 22.39 mins  Patient history: 71yo F with seizure in setting of hypoglycemia. EEG to evaluate for seizure  Level of alertness: Awake  AEDs during EEG study: None  Technical aspects: This EEG study was done with scalp electrodes positioned according to the 10-20 International system of electrode placement. Electrical activity was reviewed with band pass filter of 1-70Hz , sensitivity of 7 uV/mm, display speed of 43mm/sec with a 60Hz  notched filter applied as appropriate. EEG data were recorded continuously and digitally stored.  Video monitoring was available and reviewed as appropriate.  Description: EEG showed continuous generalized 3 to 6 Hz theta-delta slowing. Generalized periodic discharges with triphasic morphology at  1Hz  were also noted, more prominent when awake/stimulated. Hyperventilation and photic stimulation were not performed.     ABNORMALITY - Periodic discharges with triphasic morphology, generalized ( GPDs) - Continuous slow, generalized  IMPRESSION: This study showed generalized periodic discharges with triphasic morphology which can be on the ictal-interictal continuum. However, the frequency, morphology and reactivity to stimulation is more commonly indicative of toxic-metabolic causes. Additionally there is moderate diffuse encephalopathy, nonspecific etiology. No seizures  were seen throughout the recording.   Kaevion Sinclair Barbra Sarks

## 2022-07-16 NOTE — Progress Notes (Signed)
EEG complete - results pending 

## 2022-07-16 NOTE — ED Notes (Signed)
Oxygen saturation 100% on NRB mask. NRB mask removed. Patient's oxygen saturation remains 97% on room air.

## 2022-07-16 NOTE — ED Notes (Signed)
Dayshift nurse told this nurse that pt's BG at 69 is acceptable per provider due to being on D 5 NS continously

## 2022-07-16 NOTE — ED Provider Notes (Addendum)
Patient received in signout from Dr. Jacelyn Grip pending remainder of serum work-up and imaging.  She presents for seizure-like activity and noted to be hypoglycemic to the 40s, provided IV dextrose.  Blood work otherwise demonstrates no leukocytosis.  CKD around baseline.  Negative troponins and lactic acid.  UA with nitrites and small leukocytes concerning for the possibility of cystitis.  This will be sent for culture and she is empirically started on ceftriaxone.  CT head without acute derangements and CXR with some mild congestion.  I reassessed the patient and daughter indicates that she is still acutely encephalopathic.  We will consult with medicine for admission.  No further hypoglycemia or seizure-like activity noted.  Shortly after this writing, around 2:20 AM, patient had a witnessed seizure-like episode.  I am called to the bedside and I do see her with her eyes deviated upwards into the left bilaterally.  Her bilateral wrists are flexed.  Does have some frothy sputum coming from the mouth with obvious blood tingeing and seems like she bit her tongue.  I am concerned about a seizure.  As the nurses drawing up benzos, this seems to abate and I suspect that she is now postictal.  I confirmed DNR status with the daughter.  She is started on Keppra.  I have updated the hospitalist of this.  .Critical Care  Performed by: Vladimir Crofts, MD Authorized by: Vladimir Crofts, MD   Critical care provider statement:    Critical care time (minutes):  30   Critical care time was exclusive of:  Separately billable procedures and treating other patients   Critical care was necessary to treat or prevent imminent or life-threatening deterioration of the following conditions:  Endocrine crisis   Critical care was time spent personally by me on the following activities:  Development of treatment plan with patient or surrogate, discussions with consultants, evaluation of patient's response to treatment, examination of  patient, ordering and review of laboratory studies, ordering and review of radiographic studies, ordering and performing treatments and interventions, pulse oximetry, re-evaluation of patient's condition and review of old charts     Vladimir Crofts, MD 07/16/22 [place], [place], MD 07/16/22 0300

## 2022-07-16 NOTE — Assessment & Plan Note (Signed)
-   We will continue Xarelto and Lopressor.

## 2022-07-16 NOTE — Assessment & Plan Note (Signed)
-   We will continue Aricept and Seroquel.

## 2022-07-16 NOTE — H&P (Addendum)
[address]   PATIENT NAME: Leah Ritter    MR#:  [id number]  DATE OF BIRTH:  04/20/1951  DATE OF ADMISSION:  07/15/2022  PRIMARY CARE PHYSICIAN: [facility]   Patient is coming from: Home  REQUESTING/REFERRING PHYSICIAN: Vladimir Crofts, MD  CHIEF COMPLAINT:   Chief Complaint  Patient presents with   Seizures    HISTORY OF PRESENT ILLNESS:  Leah Ritter is a 71 y.o. African-American female with medical history significant for paroxysmal atrial fibrillation, anxiety, CHF, type 2 diabetes mellitus, GERD, hypertension, CVA and obstructive sleep apnea, who presented to the emergency room with acute onset of seizure at home that was described by her family as clenching and both arms with eyes rolling backward not responding followed by confusion.  Her blood glucose was in the 40s.  She had another episode in the emergency room that was similar at that time she bit her lip.  She was postictal during my interview.  She did not have any fever or chills.  No nausea or vomiting or abdominal pain.  No reported stool incontinence.  No reported chest pain or palpitations.  No cough or wheezing or dyspnea per her family.  The patient did not have any history due to somnolence and postictal state.  ED Course: When she came to the ER, she was hypothermic with a temperature of 94.7 with otherwise normal vital signs.  General respiratory rate was 21.  Labs were within normal limits with a calcium of 5.1 and glucose of 48 and creatinine 1.28 comparable to previous levels.  Sensitive troponin was 17 and later 20.  Lactic acid was 1.1 CBC showed hemoglobin 11 TSH was 4.31. EKG as reviewed by me : EKG showed sinus rhythm with a rate of 66 with borderline intraventricular conduction delay and poor R wave progression. Imaging: Portable chest ray showed mild cardiomegaly with pulm vascular congestion.  Noncontrasted head CT scan revealed atrophy and chronic small vessel ischemic changes of  the white matter, chronic right frontal infarction no acute intracranial abnormality.  The patient was given a gram of IV Rocephin, 250 mL of D10W and 50 mL of D5W as well as 1 g of IV Keppra..  Blood glucose was 54 and she was given an amp of D50.  She will be admitted to a medical telemetry bed for further evaluation and management. PAST MEDICAL HISTORY:   Past Medical History:  Diagnosis Date   A-fib ([place])    Anxiety    CHF (congestive heart failure) (HCC)    Diabetes (HCC)    GERD (gastroesophageal reflux disease)    Hypertension    Sleep apnea    Stroke ([place])     PAST SURGICAL HISTORY:   Past Surgical History:  Procedure Laterality Date   PARTIAL HYSTERECTOMY     tubligation      SOCIAL HISTORY:   Social History   Tobacco Use   Smoking status: Never   Smokeless tobacco: Never  Substance Use Topics   Alcohol use: Not Currently    FAMILY HISTORY:   Family History  Problem Relation Age of Onset   Breast cancer Mother    Stroke Father    Hypertension Father    Kidney disease Brother    Cancer Brother     DRUG ALLERGIES:   Allergies  Allergen Reactions   Oxycodone-Acetaminophen Nausea And Vomiting    Other reaction(s): Vomiting   Ezetimibe Other (See Comments)    Reaction unknown   Lisinopril  Other (See Comments)    Reaction unknown Other reaction(s): Unknown   Statins Other (See Comments)    Reaction unknown     REVIEW OF SYSTEMS:   ROS As per history of present illness. All pertinent systems were reviewed above. Constitutional, HEENT, cardiovascular, respiratory, GI, GU, musculoskeletal, neuro, psychiatric, endocrine, integumentary and hematologic systems were reviewed and are otherwise negative/unremarkable except for positive findings mentioned above in the HPI.   MEDICATIONS AT HOME:   Prior to Admission medications   Medication Sig Start Date End Date Taking? Authorizing Provider  ALPRAZolam (XANAX) 0.25 MG tablet Take 1 tablet (0.25 mg  total) by mouth 2 (two) times daily as needed (for agitation). 12/15/21   Osvaldo Shipper, MD  amLODipine (NORVASC) 5 MG tablet Take 1 tablet (5 mg total) by mouth daily. 07/15/21   Pokhrel, Rebekah Chesterfield, MD  aspirin EC 81 MG tablet Take 81 mg by mouth daily.    [provider]  atorvastatin (LIPITOR) 80 MG tablet Take 80 mg by mouth daily at 6 PM.    [provider]  CONTOUR NEXT TEST test strip 1 each by Other route every evening. 11/14/17   [provider]  feeding supplement (ENSURE ENLIVE / ENSURE PLUS) LIQD Take 237 mLs by mouth 3 (three) times daily between meals. 07/14/21   Pokhrel, Rebekah Chesterfield, MD  furosemide (LASIX) 20 MG tablet Take 20 mg by mouth daily. 12/09/21   [provider]  glipiZIDE (GLUCOTROL) 10 MG tablet Take 10 mg by mouth 2 (two) times daily before a meal.     [provider]  metoprolol tartrate (LOPRESSOR) 50 MG tablet Take 75 mg by mouth daily. 12/09/21   [provider]  mirtazapine (REMERON SOL-TAB) 15 MG disintegrating tablet Take 1 tablet (15 mg total) by mouth at bedtime. 05/08/21   Arnetha Courser, MD  Multiple Vitamin (MULTIVITAMIN WITH MINERALS) TABS tablet Take 1 tablet by mouth daily. 07/15/21   Pokhrel, Rebekah Chesterfield, MD  MYRBETRIQ 25 MG TB24 tablet Take 25 mg by mouth daily. 04/29/21   [provider]  nystatin cream (MYCOSTATIN) Apply 1 application. topically 2 (two) times daily. Apply to red rash under breasts twice a day until healed. 10/29/21   [provider]  polyethylene glycol (MIRALAX / GLYCOLAX) 17 g packet Take 17 g by mouth daily as needed for moderate constipation. 07/14/21   Pokhrel, Rebekah Chesterfield, MD  QUEtiapine (SEROQUEL) 50 MG tablet Take 50 mg by mouth at bedtime. 04/29/21   [provider]  Vitamin D, Ergocalciferol, 2000 units CAPS Take 2,000 Units by mouth every morning. Patient not taking: Reported on 12/10/2021    [provider]  Vitamin D3 (VITAMIN D) 25 MCG tablet Take 2,000 Units by  mouth daily.    [provider]  XARELTO 20 MG TABS tablet Take 20 mg by mouth daily. 04/29/21   [provider]      VITAL SIGNS:  Blood pressure 135/88, pulse 62, temperature (!) 94.1 F (34.5 C), temperature source Rectal, resp. rate (!) 21, height 5\' 9"  (1.753 m), weight 96 kg, SpO2 100 %.  PHYSICAL EXAMINATION:  Physical Exam  GENERAL:  71 y.o.-year-old African-American female patient lying in the bed with no acute distress.  She was fairly somnolent and postictal state. EYES: Pupils equal, round, reactive to light and accommodation. No scleral icterus. Extraocular muscles intact.  HEENT: Head atraumatic, normocephalic. Oropharynx and nasopharynx clear.  NECK:  Supple, no jugular venous distention. No thyroid enlargement, no tenderness.  LUNGS: Normal breath  sounds bilaterally, no wheezing, rales,rhonchi or crepitation. No use of accessory muscles of respiration.  CARDIOVASCULAR: Regular rate and rhythm, S1, S2 normal. No murmurs, rubs, or gallops.  ABDOMEN: Soft, nondistended, nontender. Bowel sounds present. No organomegaly or mass.  EXTREMITIES: No pedal edema, cyanosis, or clubbing.  NEUROLOGIC: She had no obvious lateralizing signs but she was not cooperative with exam due to somnolence. PSYCHIATRIC: The patient is fairly somnolent and postictal state. SKIN: No obvious rash, lesion, or ulcer.   LABORATORY PANEL:   CBC Recent Labs  Lab 07/16/22 0403  WBC 4.7  HGB 10.1*  HCT 32.4*  PLT 154   ------------------------------------------------------------------------------------------------------------------  Chemistries  Recent Labs  Lab 07/16/22 0403  NA 137  K 3.9  CL 103  CO2 28  GLUCOSE 226*  BUN 32*  CREATININE 1.15*  CALCIUM 8.6*   ------------------------------------------------------------------------------------------------------------------  Cardiac Enzymes No results for input(s): "TROPONINI" in the last 168  hours. ------------------------------------------------------------------------------------------------------------------  RADIOLOGY:  DG Chest Port 1 View  Result Date: 07/15/2022 CLINICAL DATA:  Questionable sepsis. EXAM: PORTABLE CHEST 1 VIEW COMPARISON:  Chest radiograph dated 12/10/2021 and CT dated 01/13/2018. FINDINGS: There is mild cardiomegaly with mild vascular congestion. No focal consolidation, pleural effusion, or pneumothorax. No acute osseous pathology. Degenerative changes of the spine. IMPRESSION: Mild cardiomegaly with mild vascular congestion. No focal consolidation. Electronically Signed   By: Elgie Collard M.D.   On: 07/15/2022 23:55   CT Head Wo Contrast  Result Date: 07/15/2022 CLINICAL DATA:  Mental status change EXAM: CT HEAD WITHOUT CONTRAST TECHNIQUE: Contiguous axial images were obtained from the base of the skull through the vertex without intravenous contrast. RADIATION DOSE REDUCTION: This exam was performed according to the departmental dose-optimization program which includes automated exposure control, adjustment of the mA and/or kV according to patient size and/or use of iterative reconstruction technique. COMPARISON:  CT brain 12/10/2021, MRI 12/11/2021 FINDINGS: Brain: No acute territorial infarction, hemorrhage or intracranial mass. Chronic right frontal lobe infarct. Atrophy and chronic small vessel ischemic changes of the white matter. Stable ventricle size. Vascular: No hyperdense vessels.  Carotid vascular calcification Skull: Normal. Negative for fracture or focal lesion. Sinuses/Orbits: No acute finding. Other: None IMPRESSION: 1. No CT evidence for acute intracranial abnormality. 2. Atrophy and chronic small vessel ischemic changes of the white matter. Chronic right frontal infarction Electronically Signed   By: Jasmine Pang M.D.   On: 07/15/2022 23:38      IMPRESSION AND PLAN:  Assessment and Plan: * Seizure [facility]) - The patient will be admitted to  medical telemetry bed. - She will be placed on seizures precautions. - She was given IV Keppra will be placed on as needed IV Ativan. - We will manage hypoglycemia is likely the main culprit for her seizures.  Sepsis due to gram-negative UTI (HCC) - This could be contributing to diminished appetite and subsequent hypoglycemia. - She will be placed on IV Rocephin. - We will follow blood and urine cultures. - Sepsis manifested by hypothermia and respiratory rate of 21 in the setting of UTI. - We will continue hydration with D5 normal saline.  Uncontrolled type 2 diabetes mellitus with hypoglycemia, without long-term current use of insulin (HCC) - The patient will be placed on very sensitive supplement coverage with NovoLog. - We will continue Jardiance and hold off Glucotrol and metformin.  Dementia with behavioral disturbance (HCC) - We will continue Aricept and Seroquel.  Paroxysmal atrial fibrillation (HCC) - We will continue Xarelto and Lopressor.  Essential hypertension We will  continue her antihypertensives.  Dyslipidemia - We will continue statin therapy.   DVT prophylaxis: Xarelto Advanced Care Planning:  Code Status: DNR/DNI.  This was discussed with the patient's family. Family Communication:  The plan of care was discussed in details with the patient (and family). I answered all questions. The patient agreed to proceed with the above mentioned plan. Further management will depend upon hospital course. Disposition Plan: Back to previous home environment Consults called: Neurology All the records are reviewed and case discussed with ED provider.  Status is: Inpatient    At the time of the admission, it appears that the appropriate admission status for this patient is inpatient.  This is judged to be reasonable and necessary in order to provide the required intensity of service to ensure the patient's safety given the presenting symptoms, physical exam findings and  initial radiographic and laboratory data in the context of comorbid conditions.  The patient requires inpatient status due to high intensity of service, high risk of further deterioration and high frequency of surveillance required.  I certify that at the time of admission, it is my clinical judgment that the patient will require inpatient hospital care extending more than 2 midnights.                            Dispo: The patient is from: Home              Anticipated d/c is to: Home              Patient currently is not medically stable to d/c.              Difficult to place patient: No  Hannah Beat M.D on 07/16/2022 at 5:16 AM  [facility]   From 7 PM-7 AM, contact night-coverage [email or web address]  CC: Primary care physician; [facility]

## 2022-07-16 NOTE — ED Notes (Signed)
EEG in progress at bedside

## 2022-07-16 NOTE — Assessment & Plan Note (Signed)
-   We will continue her antihypertensives. 

## 2022-07-17 DIAGNOSIS — R569 Unspecified convulsions: Secondary | ICD-10-CM | POA: Diagnosis not present

## 2022-07-17 LAB — CBC WITH DIFFERENTIAL/PLATELET
Abs Immature Granulocytes: 0.01 10*3/uL (ref 0.00–0.07)
Basophils Absolute: 0 10*3/uL (ref 0.0–0.1)
Basophils Relative: 1 %
Eosinophils Absolute: 0.1 10*3/uL (ref 0.0–0.5)
Eosinophils Relative: 2 %
HCT: 34.1 % — ABNORMAL LOW (ref 36.0–46.0)
Hemoglobin: 10.8 g/dL — ABNORMAL LOW (ref 12.0–15.0)
Immature Granulocytes: 0 %
Lymphocytes Relative: 26 %
Lymphs Abs: 1.3 10*3/uL (ref 0.7–4.0)
MCH: 28.1 pg (ref 26.0–34.0)
MCHC: 31.7 g/dL (ref 30.0–36.0)
MCV: 88.8 fL (ref 80.0–100.0)
Monocytes Absolute: 0.5 10*3/uL (ref 0.1–1.0)
Monocytes Relative: 11 %
Neutro Abs: 3.1 10*3/uL (ref 1.7–7.7)
Neutrophils Relative %: 60 %
Platelets: 150 10*3/uL (ref 150–400)
RBC: 3.84 MIL/uL — ABNORMAL LOW (ref 3.87–5.11)
RDW: 15.4 % (ref 11.5–15.5)
WBC: 5.1 10*3/uL (ref 4.0–10.5)
nRBC: 0 % (ref 0.0–0.2)

## 2022-07-17 LAB — CBG MONITORING, ED
Glucose-Capillary: 99 mg/dL (ref 70–99)
Glucose-Capillary: 88 mg/dL (ref 70–99)
Glucose-Capillary: 78 mg/dL (ref 70–99)
Glucose-Capillary: 96 mg/dL (ref 70–99)

## 2022-07-17 LAB — GLUCOSE, CAPILLARY
Glucose-Capillary: 86 mg/dL (ref 70–99)
Glucose-Capillary: 102 mg/dL — ABNORMAL HIGH (ref 70–99)

## 2022-07-17 LAB — BASIC METABOLIC PANEL
Anion gap: 5 (ref 5–15)
BUN: 22 mg/dL (ref 8–23)
CO2: 24 mmol/L (ref 22–32)
Calcium: 8.8 mg/dL — ABNORMAL LOW (ref 8.9–10.3)
Chloride: 110 mmol/L (ref 98–111)
Creatinine, Ser: 1.09 mg/dL — ABNORMAL HIGH (ref 0.44–1.00)
GFR, Estimated: 54 mL/min — ABNORMAL LOW (ref >60)
Glucose, Bld: 78 mg/dL (ref 70–99)
Potassium: 3.9 mmol/L (ref 3.5–5.1)
Sodium: 139 mmol/L (ref 135–145)

## 2022-07-17 NOTE — Progress Notes (Signed)
PROGRESS NOTE    Leah Ritter  FYB:[id number] DOB: 07-13-1951 DOA: 07/15/2022 PCP: Orvis Brill, [facility]    Brief Narrative:  71 y.o. African-American female with medical history significant for paroxysmal atrial fibrillation, anxiety, CHF, type 2 diabetes mellitus, GERD, hypertension, CVA and obstructive sleep apnea, who presented to the emergency room with acute onset of seizure at home that was described by her family as clenching and both arms with eyes rolling backward not responding followed by confusion.  Her blood glucose was in the 40s.  She had another episode in the emergency room that was similar at that time she bit her lip.  She was postictal during my interview.  She did not have any fever or chills.  No nausea or vomiting or abdominal pain.  No reported stool incontinence.  No reported chest pain or palpitations.  No cough or wheezing or dyspnea per her family.  Patient was found to be hypothermic and mildly hypotensive.  Started on intravenous Rocephin for suspected UTI.  Received extra containing fluids.  Started on Keppra.  Discussed with neurology.  Continue Keppra at this time.  Routine EEG and MRI ordered.  CBGs every 4 hours.  10/21: CBGs have improved.  Remains on D5 infusion.  Mental status slowly picking up.  MRI unrevealing.  EEG negative for epileptiform discharges.   Assessment & Plan:   Principal Problem:   Seizure ([place]) Active Problems:   Sepsis due to gram-negative UTI ([place])   Uncontrolled type 2 diabetes mellitus with hypoglycemia, without long-term current use of insulin ([facility])   Dyslipidemia   Essential hypertension   Paroxysmal atrial fibrillation ([facility])   Dementia with behavioral disturbance ([place])  Possible new onset seizure Unclear etiology.  Could have been precipitated in the setting of hypoglycemia and urinary tract infection.  Seen by neurology.  Started on Keppra. Plan: Treat infection as below Avoid hypoglycemia Continue Keppra Seizure  precautions Inpatient neurology follow-up Outpatient neurology referral   Sepsis due to gram-negative UTI ([place]) Likely precipitating factor to decreased appetite and subsequent hypoglycemia.  Could have resulted in seizure-like activity.  Sepsis criteria met with hypothermia and tachypnea in the setting of urinary tract infection Plan: Continue IV Rocephin Follow blood and urine cultures Continue hydration with D5 NS Monitor vitals and fever curve   Uncontrolled type 2 diabetes mellitus with hypoglycemia, without long-term current use of insulin (HCC) Jardiance, Glucotrol, metformin held Very sensitive sliding scale   Dementia with behavioral disturbance (HCC) PTA Aricept and Seroquel  Paroxysmal atrial fibrillation (HCC) PTA Xarelto and Lopressor   Essential hypertension Continue home antihypertensives as BP allows   Dyslipidemia PTA statin  DVT prophylaxis: Xarelto Code Status: DNR Family Communication: Daughter at bedside 10/21 Disposition Plan: Status is: Inpatient Remains inpatient appropriate because: Sepsis, hypoglycemia, suspected new onset seizure   Level of care: Telemetry Medical  Consultants:  Neurology  Procedures:  None  Antimicrobials: Ceftriaxone   Subjective: Seen and examined.  Slowly waking up but still too lethargic to participate in interview.  Daughter at bedside.  Objective: Vitals:   07/17/22 0630 07/17/22 0900 07/17/22 0930 07/17/22 1130  BP: 126/64 121/60 (!) 126/55 (!) 108/57  Pulse: 75 82 80 89  Resp: 16 (!) $Remo'21 16 16  'gnbPO$ Temp: (!) 96.9 F (36.1 C) (!) 97.4 F (36.3 C) (!) 97.5 F (36.4 C) 98.3 F (36.8 C)  TempSrc:      SpO2: 98% 97% 95% 99%  Weight:      Height:  Intake/Output Summary (Last 24 hours) at 07/17/2022 1326 Last data filed at 07/17/2022 1311 Gross per 24 hour  Intake 200 ml  Output 1400 ml  Net -1200 ml   Filed Weights   07/16/22 0230  Weight: 96 kg    Examination:  General exam: No acute  distress.  Sleepy Respiratory system: Clear.  Normal work of breathing.  Room air Cardiovascular system: S1-S2, regular rate, irregular rhythm, no murmurs, no pedal edema Gastrointestinal system: Soft, NT/ND, normal bowel sounds Central nervous system: Lethargic.  Unable to assess orientation.  No focal deficits Extremities: Unable to assess Skin: No rashes, lesions or ulcers Psychiatry: Unable to assess    Data Reviewed: I have personally reviewed following labs and imaging studies  CBC: Recent Labs  Lab 07/15/22 2229 07/16/22 0403  WBC 5.6 4.7  NEUTROABS 4.5  --   HGB 11.9* 10.1*  HCT 38.0 32.4*  MCV 89.8 90.0  PLT 175 235   Basic Metabolic Panel: Recent Labs  Lab 07/15/22 2229 07/16/22 0403 07/17/22 0958  NA 139 137 139  K 5.1 3.9 3.9  CL 104 103 110  CO2 $Re'27 28 24  'mFl$ GLUCOSE 48* 226* 78  BUN 33* 32* 22  CREATININE 1.28* 1.15* 1.09*  CALCIUM 8.9 8.6* 8.8*   GFR: Estimated Creatinine Clearance: 57.5 mL/min (A) (by C-G formula based on SCr of 1.09 mg/dL (H)). Liver Function Tests: No results for input(s): "AST", "ALT", "ALKPHOS", "BILITOT", "PROT", "ALBUMIN" in the last 168 hours. No results for input(s): "LIPASE", "AMYLASE" in the last 168 hours. No results for input(s): "AMMONIA" in the last 168 hours. Coagulation Profile: No results for input(s): "INR", "PROTIME" in the last 168 hours. Cardiac Enzymes: No results for input(s): "CKTOTAL", "CKMB", "CKMBINDEX", "TROPONINI" in the last 168 hours. BNP (last 3 results) No results for input(s): "PROBNP" in the last 8760 hours. HbA1C: Recent Labs    07/16/22 0403  HGBA1C 8.4*   CBG: Recent Labs  Lab 07/16/22 1733 07/16/22 2046 07/17/22 0411 07/17/22 0810 07/17/22 1111  GLUCAP 69* 69* 99 88 78   Lipid Profile: No results for input(s): "CHOL", "HDL", "LDLCALC", "TRIG", "CHOLHDL", "LDLDIRECT" in the last 72 hours. Thyroid Function Tests: Recent Labs    07/16/22 0200  TSH 4.310   Anemia Panel: No  results for input(s): "VITAMINB12", "FOLATE", "FERRITIN", "TIBC", "IRON", "RETICCTPCT" in the last 72 hours. Sepsis Labs: Recent Labs  Lab 07/15/22 2353 07/16/22 0403  LATICACIDVEN 1.1 1.9    Recent Results (from the past 240 hour(s))  Culture, blood (x 2)     Status: None (Preliminary result)   Collection Time: 07/16/22  4:04 AM   Specimen: BLOOD  Result Value Ref Range Status   Specimen Description BLOOD LEFT ANTECUBITAL  Final   Special Requests   Final    BOTTLES DRAWN AEROBIC AND ANAEROBIC Blood Culture adequate volume   Culture   Final    NO GROWTH 1 DAY Performed at [facility], [address], [place], [place] [address]    Report Status PENDING  Incomplete  Culture, blood (x 2)     Status: None (Preliminary result)   Collection Time: 07/16/22  4:04 AM   Specimen: BLOOD  Result Value Ref Range Status   Specimen Description BLOOD BLOOD RIGHT FOREARM  Final   Special Requests   Final    BOTTLES DRAWN AEROBIC AND ANAEROBIC Blood Culture results may not be optimal due to an excessive volume of blood received in culture bottles   Culture   Final  NO GROWTH 1 DAY Performed at [facility], [address] [place], [place] [address]    Report Status PENDING  Incomplete         Radiology Studies: MR BRAIN WO CONTRAST  Result Date: 07/16/2022 CLINICAL DATA:  Seizure EXAM: MRI HEAD WITHOUT CONTRAST TECHNIQUE: Multiplanar, multiecho pulse sequences of the brain and surrounding structures were obtained without intravenous contrast. COMPARISON:  CT head 07/16/2019 FINDINGS: Brain: No acute infarction, hemorrhage, hydrocephalus, extra-axial collection or mass lesion. 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 Sequela of moderate chronic microvascular ischemic change. There are scattered punctate lobar microhemorrhages, for example in the right temporal lobe (series 17, image 25), right occipital lobe (series 17, image 27). Vascular: Normal flow  voids. Skull and upper cervical spine: Normal marrow signal. Sinuses/Orbits: Negative. Other: None. IMPRESSION: 1. No acute intracranial process.  No seizure focus identified. 2. 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 3. Scattered punctate lobar microhemorrhages are nonspecific, but could be seen in the setting of cerebral amyloid angiopathy. Electronically Signed   By: Marin Roberts M.D.   On: 07/16/2022 11:58   EEG adult  Result Date: 07/16/2022 Lora Havens, MD     07/16/2022 11:05 AM Patient Name: Nil Bolser MRN: [id number] Epilepsy Attending: Lora Havens Referring Physician/Provider: Christel Mormon, MD Date: 07/16/2022 Duration: 22.39 mins Patient history: 71yo F with seizure in setting of hypoglycemia. EEG to evaluate for seizure Level of alertness: Awake AEDs during EEG study: None Technical aspects: This EEG study was done with scalp electrodes positioned according to the 10-20 International system of electrode placement. Electrical activity was reviewed with band pass filter of 1-$RemoveBef'70Hz'FriKeKRwnf$ , sensitivity of 7 uV/mm, display speed of 72mm/sec with a $Remo'60Hz'mUDuK$  notched filter applied as appropriate. EEG data were recorded continuously and digitally stored.  Video monitoring was available and reviewed as appropriate. Description: EEG showed continuous generalized 3 to 6 Hz theta-delta slowing. Generalized periodic discharges with triphasic morphology at  $R'1Hz'Fk$  were also noted, more prominent when awake/stimulated. Hyperventilation and photic stimulation were not performed.   ABNORMALITY - Periodic discharges with triphasic morphology, generalized ( GPDs) - Continuous slow, generalized IMPRESSION: This study showed generalized periodic discharges with triphasic morphology which can be on the ictal-interictal continuum. However, the frequency, morphology and reactivity to stimulation is more commonly indicative of toxic-metabolic causes. Additionally there is moderate diffuse encephalopathy,  nonspecific etiology. No seizures  were seen throughout the recording. Lora Havens   DG Chest Port 1 View  Result Date: 07/15/2022 CLINICAL DATA:  Questionable sepsis. EXAM: PORTABLE CHEST 1 VIEW COMPARISON:  Chest radiograph dated 12/10/2021 and CT dated 01/13/2018. FINDINGS: There is mild cardiomegaly with mild vascular congestion. No focal consolidation, pleural effusion, or pneumothorax. No acute osseous pathology. Degenerative changes of the spine. IMPRESSION: Mild cardiomegaly with mild vascular congestion. No focal consolidation. Electronically Signed   By: Anner Crete M.D.   On: 07/15/2022 23:55   CT Head Wo Contrast  Result Date: 07/15/2022 CLINICAL DATA:  Mental status change EXAM: CT HEAD WITHOUT CONTRAST TECHNIQUE: Contiguous axial images were obtained from the base of the skull through the vertex without intravenous contrast. RADIATION DOSE REDUCTION: This exam was performed according to the departmental dose-optimization program which includes automated exposure control, adjustment of the mA and/or kV according to patient size and/or use of iterative reconstruction technique. COMPARISON:  CT brain 12/10/2021, MRI 12/11/2021 FINDINGS: Brain: No acute territorial infarction, hemorrhage or intracranial mass. Chronic right frontal lobe infarct. Atrophy and chronic  small vessel ischemic changes of the white matter. Stable ventricle size. Vascular: No hyperdense vessels.  Carotid vascular calcification Skull: Normal. Negative for fracture or focal lesion. Sinuses/Orbits: No acute finding. Other: None IMPRESSION: 1. No CT evidence for acute intracranial abnormality. 2. Atrophy and chronic small vessel ischemic changes of the white matter. Chronic right frontal infarction Electronically Signed   By: Donavan Foil M.D.   On: 07/15/2022 23:38        Scheduled Meds:  amLODipine  5 mg Oral Daily   atorvastatin  80 mg Oral q1800   empagliflozin  10 mg Oral Daily   enoxaparin (LOVENOX)  injection  0.5 mg/kg Subcutaneous Q24H   feeding supplement  237 mL Oral TID BM   insulin aspart  0-5 Units Subcutaneous QHS   insulin aspart  0-6 Units Subcutaneous TID WC   mirtazapine  15 mg Oral QHS   oxybutynin  5 mg Oral Daily   QUEtiapine  25 mg Oral QHS   Continuous Infusions:  cefTRIAXone (ROCEPHIN)  IV Stopped (07/17/22 1311)   dextrose 5 % and 0.9% NaCl 100 mL/hr at 07/17/22 0258   levETIRAcetam Stopped (07/17/22 0428)     LOS: 1 day     Sidney Ace, MD [facility]   If 7PM-7AM, please contact night-coverage  07/17/2022, 1:26 PM

## 2022-07-17 NOTE — ED Notes (Signed)
Bair Hugger turned up to [employer]

## 2022-07-17 NOTE — ED Notes (Signed)
Lab contacted to send phlebotomist for blood draw 

## 2022-07-17 NOTE — ED Notes (Signed)
Pt is A&Ox0. Pt is speaking but not making any sense. Pt is under a bear hugger due to temp of 93.9 upon start of shift. Pt keeps drawing her knees up which makes a little pocket for air to get in between bear hugger and skin. Pt adjusted to help with positioning of bear hugger.Pt has been pulling at different items in the bed per daughter and pt usual ends up in mits during her hospitalizations. Pt has foley in place that is below the level of the bed but not on floor.

## 2022-07-17 NOTE — Progress Notes (Signed)
Neurology Progress Note   S:// Seen and examined.  More awake.  Still appears confused No family at bedside today.   O:// Current vital signs: BP (!) 126/55   Pulse 80   Temp (!) 97.5 F (36.4 C)   Resp 16   Ht 5\' 9"  (1.753 m)   Wt 96 kg   SpO2 95%   BMI 31.25 kg/m  Vital signs in last 24 hours: Temp:  [93.3 F (34.1 C)-97.5 F (36.4 C)] 97.5 F (36.4 C) (10/21 0930) Pulse Rate:  [57-86] 80 (10/21 0930) Resp:  [12-30] 16 (10/21 0930) BP: (89-135)/(51-107) 126/55 (10/21 0930) SpO2:  [95 %-100 %] 95 % (10/21 0930) General: Awake, appearing confused HEENT: Normocephalic atraumatic Lungs: Clear Cardiovascular: Regular rhythm Neurologic exam She appears awake, she is alert.  She is able to tell me her name. Very slow to respond but she is able to name couple of simple objects. Not able to follow commands consistently but did raise her arms to my command upon multiple attempts. Cranial nerves: Pupils equal round reactive light, extraocular movements appear unhindered, is having trouble fixating on objects, does not blink to threat from either side consistently, face appears symmetric. Motor examination with antigravity strength in both upper extremities.  Not moving bilateral lower extremities much Sensory exam: Grimace to noxious stimulation in all fours without frank withdrawal of the lower extremities bilaterally Coordination cannot be tested due to her mentation  Medications  Current Facility-Administered Medications:    acetaminophen (TYLENOL) tablet 650 mg, 650 mg, Oral, Q6H PRN **OR** acetaminophen (TYLENOL) suppository 650 mg, 650 mg, Rectal, Q6H PRN, Mansy, Jan A, MD   amLODipine (NORVASC) tablet 5 mg, 5 mg, Oral, Daily, Mansy, Jan A, MD   atorvastatin (LIPITOR) tablet 80 mg, 80 mg, Oral, q1800, Mansy, Jan A, MD   bisacodyl (DULCOLAX) suppository 10 mg, 10 mg, Rectal, PRN, Mansy, Jan A, MD   cefTRIAXone (ROCEPHIN) 2 g in sodium chloride 0.9 % 100 mL IVPB, 2 g,  Intravenous, Q24H, Mansy, Jan A, MD, Stopped at 07/16/22 1330   dextrose 5 %-0.9 % sodium chloride infusion, , Intravenous, Continuous, Sreenath, Sudheer B, MD, Last Rate: 100 mL/hr at 07/17/22 0258, New Bag at 07/17/22 0258   empagliflozin (JARDIANCE) tablet 10 mg, 10 mg, Oral, Daily, Mansy, Jan A, MD   enoxaparin (LOVENOX) injection 47.5 mg, 0.5 mg/kg, Subcutaneous, Q24H, Mansy, Jan A, MD, 47.5 mg at 07/17/22 1023   feeding supplement (ENSURE ENLIVE / ENSURE PLUS) liquid 237 mL, 237 mL, Oral, TID BM, Mansy, Jan A, MD   insulin aspart (novoLOG) injection 0-5 Units, 0-5 Units, Subcutaneous, QHS, Mansy, Jan A, MD   insulin aspart (novoLOG) injection 0-6 Units, 0-6 Units, Subcutaneous, TID WC, Mansy, Jan A, MD   levETIRAcetam (KEPPRA) IVPB 500 mg/100 mL premix, 500 mg, Intravenous, Q12H, Amie Portland, MD, Stopped at 07/17/22 0428   LORazepam (ATIVAN) injection 1 mg, 1 mg, Intravenous, Q1H PRN, Mansy, Jan A, MD   LORazepam (ATIVAN) tablet 0.5 mg, 0.5 mg, Oral, Q4H PRN, Mansy, Jan A, MD   magnesium hydroxide (MILK OF MAGNESIA) suspension 30 mL, 30 mL, Oral, Daily PRN, Mansy, Jan A, MD   mirtazapine (REMERON SOL-TAB) disintegrating tablet 15 mg, 15 mg, Oral, QHS, Mansy, Jan A, MD   morphine CONCENTRATE 10 MG/0.5ML oral solution 20 mg, 20 mg, Oral, Q1H PRN, Mansy, Jan A, MD   ondansetron (ZOFRAN) tablet 4 mg, 4 mg, Oral, Q6H PRN **OR** ondansetron (ZOFRAN) injection 4 mg, 4 mg, Intravenous, Q6H PRN, Mansy,  Arvella Merles, MD   oxybutynin (DITROPAN) tablet 5 mg, 5 mg, Oral, Daily, Mansy, Jan A, MD   QUEtiapine (SEROQUEL) tablet 25 mg, 25 mg, Oral, QHS, Sreenath, Sudheer B, MD   traMADol (ULTRAM) tablet 50 mg, 50 mg, Oral, Q8H PRN, Mansy, Jan A, MD   traZODone (DESYREL) tablet 25 mg, 25 mg, Oral, QHS PRN, Mansy, Jan A, MD  Current Outpatient Medications:    amLODipine (NORVASC) 5 MG tablet, Take 1 tablet (5 mg total) by mouth daily., Disp: , Rfl:    atorvastatin (LIPITOR) 80 MG tablet, Take 80 mg by mouth daily  at 6 PM., Disp: , Rfl:    bisacodyl (DULCOLAX) 10 MG suppository, Place 10 mg rectally as needed for moderate constipation., Disp: , Rfl:    CONTOUR NEXT TEST test strip, 1 each by Other route every evening., Disp: , Rfl: 4   empagliflozin (JARDIANCE) 10 MG TABS tablet, Take 10 mg by mouth daily., Disp: , Rfl:    feeding supplement (ENSURE ENLIVE / ENSURE PLUS) LIQD, Take 237 mLs by mouth 3 (three) times daily between meals., Disp: , Rfl:    furosemide (LASIX) 20 MG tablet, Take 30 mg by mouth daily., Disp: , Rfl:    glipiZIDE (GLUCOTROL) 10 MG tablet, Take 10 mg by mouth 2 (two) times daily before a meal. , Disp: , Rfl:    ibuprofen (ADVIL) 600 MG tablet, Take 600 mg by mouth every 6 (six) hours as needed., Disp: , Rfl:    ketoconazole (NIZORAL) 2 % shampoo, Apply 1 Application topically 2 (two) times a week., Disp: , Rfl:    LORazepam (ATIVAN) 0.5 MG tablet, Take 0.5 mg by mouth every 4 (four) hours as needed for seizure., Disp: , Rfl:    mirtazapine (REMERON SOL-TAB) 15 MG disintegrating tablet, Take 1 tablet (15 mg total) by mouth at bedtime., Disp: , Rfl:    Morphine Sulfate (MORPHINE CONCENTRATE) 10 mg / 0.5 ml concentrated solution, Take 20 mg by mouth every hour as needed for severe pain or shortness of breath., Disp: , Rfl:    NON FORMULARY, 2 (two) times daily as needed., Disp: , Rfl:    oxybutynin (DITROPAN) 5 MG tablet, Take 5 mg by mouth daily., Disp: , Rfl:    QUEtiapine (SEROQUEL) 50 MG tablet, Take 25 mg by mouth at bedtime., Disp: , Rfl:    traMADol (ULTRAM) 50 MG tablet, Take 50 mg by mouth every 8 (eight) hours as needed., Disp: , Rfl:    ALPRAZolam (XANAX) 0.25 MG tablet, Take 1 tablet (0.25 mg total) by mouth 2 (two) times daily as needed (for agitation). (Patient not taking: Reported on 07/16/2022), Disp: 30 tablet, Rfl: 0   aspirin EC 81 MG tablet, Take 81 mg by mouth daily. (Patient not taking: Reported on 07/16/2022), Disp: , Rfl:    metoprolol tartrate (LOPRESSOR) 50 MG  tablet, Take 75 mg by mouth daily. (Patient not taking: Reported on 07/16/2022), Disp: , Rfl:    Multiple Vitamin (MULTIVITAMIN WITH MINERALS) TABS tablet, Take 1 tablet by mouth daily. (Patient not taking: Reported on 07/16/2022), Disp: , Rfl:    MYRBETRIQ 25 MG TB24 tablet, Take 25 mg by mouth daily. (Patient not taking: Reported on 07/16/2022), Disp: , Rfl:    nystatin cream (MYCOSTATIN), Apply 1 application. topically 2 (two) times daily. Apply to red rash under breasts twice a day until healed. (Patient not taking: Reported on 07/16/2022), Disp: , Rfl:    polyethylene glycol (MIRALAX / GLYCOLAX) 17 g packet,  Take 17 g by mouth daily as needed for moderate constipation. (Patient not taking: Reported on 07/16/2022), Disp: , Rfl:    Vitamin D, Ergocalciferol, 2000 units CAPS, Take 2,000 Units by mouth every morning. (Patient not taking: Reported on 12/10/2021), Disp: , Rfl:    Vitamin D3 (VITAMIN D) 25 MCG tablet, Take 2,000 Units by mouth daily. (Patient not taking: Reported on 07/16/2022), Disp: , Rfl:    XARELTO 20 MG TABS tablet, Take 20 mg by mouth daily. (Patient not taking: Reported on 07/16/2022), Disp: , Rfl:  Labs CBC    Component Value Date/Time   WBC 4.7 07/16/2022 0403   RBC 3.60 (L) 07/16/2022 0403   HGB 10.1 (L) 07/16/2022 0403   HCT 32.4 (L) 07/16/2022 0403   PLT 154 07/16/2022 0403   MCV 90.0 07/16/2022 0403   MCH 28.1 07/16/2022 0403   MCHC 31.2 07/16/2022 0403   RDW 14.9 07/16/2022 0403   LYMPHSABS 0.7 07/15/2022 2229   MONOABS 0.3 07/15/2022 2229   EOSABS 0.1 07/15/2022 2229   BASOSABS 0.0 07/15/2022 2229    CMP     Component Value Date/Time   NA 139 07/17/2022 0958   K 3.9 07/17/2022 0958   CL 110 07/17/2022 0958   CO2 24 07/17/2022 0958   GLUCOSE 78 07/17/2022 0958   BUN 22 07/17/2022 0958   CREATININE 1.09 (H) 07/17/2022 0958   CALCIUM 8.8 (L) 07/17/2022 0958   PROT 7.6 12/15/2021 0411   ALBUMIN 3.4 (L) 12/15/2021 0411   AST 47 (H) 12/15/2021 0411    ALT 49 (H) 12/15/2021 0411   ALKPHOS 106 12/15/2021 0411   BILITOT 0.5 12/15/2021 0411   GFRNONAA 54 (L) 07/17/2022 0958   GFRAA >60 01/10/2019 0302    Imaging I have reviewed images in epic and the results pertinent to this consultation are: MRI of the brain with no acute intracranial process.  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  Scattered punctate lobar microhemorrhages nonspecific but could be seen in the setting of uncontrolled hypertension as well as amyloid angiopathy, although on my personal review, I am not impressed with this being amyloid angiopathy at all.  Assessment:  71 year old with vascular dementia and multiple other cerebrovascular risk factors presents for evaluation of whole body stiffening concerning for generalized seizure, at least for a second time since March.  Prior EEG imaging was normal.  EEG this time reveals G peds with triphasic morphology.  No seizures or epileptiform activity. Started on antiepileptics. MRI imaging negative for stroke Also noted to be positive for UTI.  Impression:  Likely toxic metabolic encephalopathy in the setting of poor brain reserve and acute UTI. Possible seizures in the setting of underlying dementia  Recommendations: At this time, I would continue medical management per primary team as you are of the UTI and toxic metabolic derangements. Since the seizure-like activity has been seen more than once, I would be hesitant to discontinue antiepileptics at this time.  I will continue her on Keppra and have her follow-up with the outpatient neurology team to see if they would want to monitor her for little while and then discontinue her antiepileptics. Minimize sedating medications-would defer this to outpatient providers who know the patient better. Outpatient neurology follow-up in 6 to 12 weeks.  I will be available as needed.  Plan discussed with Dr.Sreenath  -- Amie Portland, MD Neurologist [facility] Pager: [phone]

## 2022-07-17 NOTE — Plan of Care (Signed)

## 2022-07-18 DIAGNOSIS — R569 Unspecified convulsions: Secondary | ICD-10-CM | POA: Diagnosis not present

## 2022-07-18 LAB — GLUCOSE, CAPILLARY
Glucose-Capillary: 102 mg/dL — ABNORMAL HIGH (ref 70–99)
Glucose-Capillary: 106 mg/dL — ABNORMAL HIGH (ref 70–99)
Glucose-Capillary: 145 mg/dL — ABNORMAL HIGH (ref 70–99)
Glucose-Capillary: 147 mg/dL — ABNORMAL HIGH (ref 70–99)
Glucose-Capillary: 175 mg/dL — ABNORMAL HIGH (ref 70–99)
Glucose-Capillary: 194 mg/dL — ABNORMAL HIGH (ref 70–99)
Glucose-Capillary: 97 mg/dL (ref 70–99)

## 2022-07-18 LAB — CBC WITH DIFFERENTIAL/PLATELET
Abs Immature Granulocytes: 0.01 10*3/uL (ref 0.00–0.07)
Basophils Absolute: 0 10*3/uL (ref 0.0–0.1)
Basophils Relative: 1 %
Eosinophils Absolute: 0.1 10*3/uL (ref 0.0–0.5)
Eosinophils Relative: 2 %
HCT: 32.3 % — ABNORMAL LOW (ref 36.0–46.0)
Hemoglobin: 10.4 g/dL — ABNORMAL LOW (ref 12.0–15.0)
Immature Granulocytes: 0 %
Lymphocytes Relative: 26 %
Lymphs Abs: 1.6 10*3/uL (ref 0.7–4.0)
MCH: 28.2 pg (ref 26.0–34.0)
MCHC: 32.2 g/dL (ref 30.0–36.0)
MCV: 87.5 fL (ref 80.0–100.0)
Monocytes Absolute: 0.7 10*3/uL (ref 0.1–1.0)
Monocytes Relative: 12 %
Neutro Abs: 3.7 10*3/uL (ref 1.7–7.7)
Neutrophils Relative %: 59 %
Platelets: 159 10*3/uL (ref 150–400)
RBC: 3.69 MIL/uL — ABNORMAL LOW (ref 3.87–5.11)
RDW: 15.2 % (ref 11.5–15.5)
WBC: 6.2 10*3/uL (ref 4.0–10.5)
nRBC: 0 % (ref 0.0–0.2)

## 2022-07-18 LAB — URINE CULTURE: Culture: >=100000 — AB

## 2022-07-18 LAB — BASIC METABOLIC PANEL
Anion gap: 5 (ref 5–15)
BUN: 16 mg/dL (ref 8–23)
CO2: 23 mmol/L (ref 22–32)
Calcium: 8.9 mg/dL (ref 8.9–10.3)
Chloride: 113 mmol/L — ABNORMAL HIGH (ref 98–111)
Creatinine, Ser: 1.26 mg/dL — ABNORMAL HIGH (ref 0.44–1.00)
GFR, Estimated: 45 mL/min — ABNORMAL LOW (ref >60)
Glucose, Bld: 106 mg/dL — ABNORMAL HIGH (ref 70–99)
Potassium: 3.6 mmol/L (ref 3.5–5.1)
Sodium: 141 mmol/L (ref 135–145)

## 2022-07-18 MED ORDER — CHLORHEXIDINE GLUCONATE CLOTH 2 % EX PADS
6.0000 | MEDICATED_PAD | Freq: Every day | CUTANEOUS | Status: DC
  Administered 2022-07-18: 6 via TOPICAL

## 2022-07-18 NOTE — Progress Notes (Signed)
PROGRESS NOTE    Leah Ritter  WIO:[id number] DOB: 1951/07/15 DOA: 07/15/2022 PCP: Orvis Brill, [facility]    Brief Narrative:  71 y.o. African-American female with medical history significant for paroxysmal atrial fibrillation, anxiety, CHF, type 2 diabetes mellitus, GERD, hypertension, CVA and obstructive sleep apnea, who presented to the emergency room with acute onset of seizure at home that was described by her family as clenching and both arms with eyes rolling backward not responding followed by confusion.  Her blood glucose was in the 40s.  She had another episode in the emergency room that was similar at that time she bit her lip.  She was postictal during my interview.  She did not have any fever or chills.  No nausea or vomiting or abdominal pain.  No reported stool incontinence.  No reported chest pain or palpitations.  No cough or wheezing or dyspnea per her family.  Patient was found to be hypothermic and mildly hypotensive.  Started on intravenous Rocephin for suspected UTI.  Received extra containing fluids.  Started on Keppra.  Discussed with neurology.  Continue Keppra at this time.  Routine EEG and MRI ordered.  CBGs every 4 hours.  10/21: CBGs have improved.  Remains on D5 infusion.  Mental status slowly picking up.  MRI unrevealing.  EEG negative for epileptiform discharges.  10/22: Urine culture with actinomyces   Assessment & Plan:   Principal Problem:   Seizure ([place]) Active Problems:   Sepsis due to gram-negative UTI ([place])   Uncontrolled type 2 diabetes mellitus with hypoglycemia, without long-term current use of insulin ([facility])   Dyslipidemia   Essential hypertension   Paroxysmal atrial fibrillation ([facility])   Dementia with behavioral disturbance ([place])  Possible new onset seizure Unclear etiology.  Could have been precipitated in the setting of hypoglycemia and urinary tract infection.  Seen by neurology.  Started on Keppra. Plan: Treat infection as  below Avoid hypoglycemia Continue Keppra Seizure precautions Inpatient neurology follow-up Outpatient neurology referral   Sepsis due to gram-negative UTI ([place]) Likely precipitating factor to decreased appetite and subsequent hypoglycemia.  Could have resulted in seizure-like activity.  Sepsis criteria met with hypothermia and tachypnea in the setting of urinary tract infection Urine culture with actinomyces Plan: Continue IV Rocephin for now 7-day stop date Follow blood and urine cultures, actinomyces growing Continue hydration with D5 NS Monitor vitals and fever curve   Uncontrolled type 2 diabetes mellitus with hypoglycemia, without long-term current use of insulin ([facility]CC) Jardiance, Glucotrol, metformin held Very sensitive sliding scale D5 NS   Dementia with behavioral disturbance ([facility]CC) PTA Aricept and Seroquel  Paroxysmal atrial fibrillation (HCC) PTA Xarelto and Lopressor   Essential hypertension Continue home antihypertensives as BP allows   Dyslipidemia PTA statin  DVT prophylaxis: Xarelto Code Status: DNR Family Communication: Daughter at bedside 10/21 Disposition Plan: Status is: Inpatient Remains inpatient appropriate because: Sepsis, hypoglycemia, suspected new onset seizure   Level of care: Telemetry Medical  Consultants:  Neurology  Procedures:  None  Antimicrobials: Ceftriaxone   Subjective: Seen and examined.  Nurse reports patient has been spitting out her pills.  More awake this morning.  No family at bedside this morning.  Objective: Vitals:   07/18/22 0111 07/18/22 0541 07/18/22 0810 07/18/22 0938  BP: (!) 145/80 (!) 142/74 (!) 157/66 (!) 154/69  Pulse: 85 73 75 75  Resp: $Remo'16 18 16   'yfyvm$ Temp: 99 F (37.2 C) 97.9 F (36.6 C) 97.8 F (36.6 C)   TempSrc:  SpO2: 100% 100% 98%   Weight:      Height:        Intake/Output Summary (Last 24 hours) at 07/18/2022 1052 Last data filed at 07/18/2022 0536 Gross per 24 hour  Intake 100 ml   Output 1400 ml  Net -1300 ml   Filed Weights   07/16/22 0230  Weight: 96 kg    Examination:  General exam: NAD Respiratory system: Clear.  Normal work of breathing.  Room air Cardiovascular system: S1 S2, RRR, no murmurs, no pedal edema Gastrointestinal system: Soft, NT/ND, normal bowel sounds Central nervous system: Lethargic.  Unable to assess orientation.  No focal deficits Extremities: Unable to assess Skin: No rashes, lesions or ulcers Psychiatry: Unable to assess    Data Reviewed: I have personally reviewed following labs and imaging studies  CBC: Recent Labs  Lab 07/15/22 2229 07/16/22 0403 07/17/22 0958 07/18/22 0745  WBC 5.6 4.7 5.1 6.2  NEUTROABS 4.5  --  3.1 3.7  HGB 11.9* 10.1* 10.8* 10.4*  HCT 38.0 32.4* 34.1* 32.3*  MCV 89.8 90.0 88.8 87.5  PLT 175 154 150 149   Basic Metabolic Panel: Recent Labs  Lab 07/15/22 2229 07/16/22 0403 07/17/22 0958 07/18/22 0745  NA 139 137 139 141  K 5.1 3.9 3.9 3.6  CL 104 103 110 113*  CO2 $Re'27 28 24 23  'yoj$ GLUCOSE 48* 226* 78 106*  BUN 33* 32* 22 16  CREATININE 1.28* 1.15* 1.09* 1.26*  CALCIUM 8.9 8.6* 8.8* 8.9   GFR: Estimated Creatinine Clearance: 49.8 mL/min (A) (by C-G formula based on SCr of 1.26 mg/dL (H)). Liver Function Tests: No results for input(s): "AST", "ALT", "ALKPHOS", "BILITOT", "PROT", "ALBUMIN" in the last 168 hours. No results for input(s): "LIPASE", "AMYLASE" in the last 168 hours. No results for input(s): "AMMONIA" in the last 168 hours. Coagulation Profile: No results for input(s): "INR", "PROTIME" in the last 168 hours. Cardiac Enzymes: No results for input(s): "CKTOTAL", "CKMB", "CKMBINDEX", "TROPONINI" in the last 168 hours. BNP (last 3 results) No results for input(s): "PROBNP" in the last 8760 hours. HbA1C: Recent Labs    07/16/22 0403  HGBA1C 8.4*   CBG: Recent Labs  Lab 07/17/22 1726 07/17/22 2024 07/18/22 0113 07/18/22 0546 07/18/22 0810  GLUCAP 86 102* 102* 106* 97    Lipid Profile: No results for input(s): "CHOL", "HDL", "LDLCALC", "TRIG", "CHOLHDL", "LDLDIRECT" in the last 72 hours. Thyroid Function Tests: Recent Labs    07/16/22 0200  TSH 4.310   Anemia Panel: No results for input(s): "VITAMINB12", "FOLATE", "FERRITIN", "TIBC", "IRON", "RETICCTPCT" in the last 72 hours. Sepsis Labs: Recent Labs  Lab 07/15/22 2353 07/16/22 0403  LATICACIDVEN 1.1 1.9    Recent Results (from the past 240 hour(s))  Urine Culture     Status: Abnormal   Collection Time: 07/15/22 11:53 PM   Specimen: In/Out Cath Urine  Result Value Ref Range Status   Specimen Description   Final    IN/OUT CATH URINE Performed at [facility], [address] [place], [place] [address]    Special Requests   Final    NONE Performed at [facility], [address] [place], [place] [address]    Culture (A)  Final    >=100,000 COLONIES/mL ACTINOMYCES SPECIES Standardized susceptibility testing for this organism is not available. Performed at [facility], [place] [address] [place], [place] [address]    Report Status 07/18/2022 FINAL  Final  Culture, blood (x 2)     Status:  None (Preliminary result)   Collection Time: 07/16/22  4:04 AM   Specimen: BLOOD  Result Value Ref Range Status   Specimen Description BLOOD LEFT ANTECUBITAL  Final   Special Requests   Final    BOTTLES DRAWN AEROBIC AND ANAEROBIC Blood Culture adequate volume   Culture   Final    NO GROWTH 2 DAYS Performed at [facility], [address] [place], [place] [address]    Report Status PENDING  Incomplete  Culture, blood (x 2)     Status: None (Preliminary result)   Collection Time: 07/16/22  4:04 AM   Specimen: BLOOD  Result Value Ref Range Status   Specimen Description BLOOD BLOOD RIGHT FOREARM  Final   Special Requests   Final    BOTTLES DRAWN AEROBIC AND ANAEROBIC Blood Culture results may not be optimal due to an excessive volume of blood received in culture  bottles   Culture   Final    NO GROWTH 2 DAYS Performed at [facility], [address] [place] [address] [place], [place] [address]    Report Status PENDING  Incomplete         Radiology Studies: MR BRAIN WO CONTRAST  Result Date: 07/16/2022 CLINICAL DATA:  Seizure EXAM: MRI HEAD WITHOUT CONTRAST TECHNIQUE: Multiplanar, multiecho pulse sequences of the brain and surrounding structures were obtained without intravenous contrast. COMPARISON:  CT head 07/16/2019 FINDINGS: Brain: No acute infarction, hemorrhage, hydrocephalus, extra-axial collection or mass lesion. 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 Sequela of moderate chronic microvascular ischemic change. There are scattered punctate lobar microhemorrhages, for example in the right temporal lobe (series 17, image 25), right occipital lobe (series 17, image 27). Vascular: Normal flow voids. Skull and upper cervical spine: Normal marrow signal. Sinuses/Orbits: Negative. Other: None. IMPRESSION: 1. No acute intracranial process.  No seizure focus identified. 2. 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 3. Scattered punctate lobar microhemorrhages are nonspecific, but could be seen in the setting of cerebral amyloid angiopathy. Electronically Signed   By: Marin Roberts M.D.   On: 07/16/2022 11:58        Scheduled Meds:  amLODipine  5 mg Oral Daily   atorvastatin  80 mg Oral q1800   Chlorhexidine Gluconate Cloth  6 each Topical Daily   enoxaparin (LOVENOX) injection  0.5 mg/kg Subcutaneous Q24H   feeding supplement  237 mL Oral TID BM   insulin aspart  0-5 Units Subcutaneous QHS   insulin aspart  0-6 Units Subcutaneous TID WC   mirtazapine  15 mg Oral QHS   oxybutynin  5 mg Oral Daily   QUEtiapine  25 mg Oral QHS   Continuous Infusions:  cefTRIAXone (ROCEPHIN)  IV Stopped (07/17/22 1311)   dextrose 5 % and 0.9% NaCl 100 mL/hr at 07/18/22 0351   levETIRAcetam 500 mg (07/18/22 0354)     LOS: 2 days      Sidney Ace, MD [facility]   If 7PM-7AM, please contact night-coverage  07/18/2022, 10:52 AM

## 2022-07-18 NOTE — Progress Notes (Signed)
Neurology Progress Note   S:// Seen and examined.  No acute changes overnight.  RN reports that she spitted out her pills.  Remains oriented only to self.   O:// Current vital signs: BP (!) 154/69 (BP Location: Left Arm)   Pulse 75   Temp 97.8 F (36.6 C)   Resp 16   Ht 5\' 9"  (1.753 m)   Wt 96 kg   SpO2 98%   BMI 31.25 kg/m  Vital signs in last 24 hours: Temp:  [97.8 F (36.6 C)-99.8 F (37.7 C)] 97.8 F (36.6 C) (10/22 0810) Pulse Rate:  [73-101] 75 (10/22 0938) Resp:  [16-22] 16 (10/22 0810) BP: (104-157)/(55-100) 154/69 (10/22 0938) SpO2:  [93 %-100 %] 98 % (10/22 0810) General: Awake, appearing confused HEENT: Normocephalic atraumatic Lungs: Clear Cardiovascular: Regular rhythm Neurologic exam She appears awake, she is alert.  She is able to tell me her name.  Perseverates on her name with every question asked. Very slow to respond but she is able to name couple of simple objects. Not able to follow commands consistently but did raise her arms to my command upon multiple attempts. Cranial nerves: Pupils equal round reactive light, extraocular movements appear unhindered, is having trouble fixating on objects, does not blink to threat from either side consistently, face appears symmetric. Motor examination with antigravity strength in both upper extremities.  Not moving bilateral lower extremities much but was able to somewhat wiggle toes inconsistently upon repeated requests. Sensory exam: Grimace to noxious stimulation in all fours without frank withdrawal of the lower extremities bilaterally Coordination cannot be tested due to her mentation  Medications  Current Facility-Administered Medications:    acetaminophen (TYLENOL) tablet 650 mg, 650 mg, Oral, Q6H PRN **OR** acetaminophen (TYLENOL) suppository 650 mg, 650 mg, Rectal, Q6H PRN, Mansy, Jan A, MD   amLODipine (NORVASC) tablet 5 mg, 5 mg, Oral, Daily, Mansy, Jan A, MD, 5 mg at 07/18/22 0943   atorvastatin  (LIPITOR) tablet 80 mg, 80 mg, Oral, q1800, Mansy, Jan A, MD   bisacodyl (DULCOLAX) suppository 10 mg, 10 mg, Rectal, PRN, Mansy, Jan A, MD   cefTRIAXone (ROCEPHIN) 2 g in sodium chloride 0.9 % 100 mL IVPB, 2 g, Intravenous, Q24H, Mansy, Jan A, MD, Stopped at 07/17/22 1311   Chlorhexidine Gluconate Cloth 2 % PADS 6 each, 6 each, Topical, Daily, Sreenath, Sudheer B, MD   dextrose 5 %-0.9 % sodium chloride infusion, , Intravenous, Continuous, Sreenath, Sudheer B, MD, Last Rate: 100 mL/hr at 07/18/22 0351, New Bag at 07/18/22 0351   enoxaparin (LOVENOX) injection 47.5 mg, 0.5 mg/kg, Subcutaneous, Q24H, Mansy, Jan A, MD, 47.5 mg at 07/18/22 0946   feeding supplement (ENSURE ENLIVE / ENSURE PLUS) liquid 237 mL, 237 mL, Oral, TID BM, Mansy, Jan A, MD   insulin aspart (novoLOG) injection 0-5 Units, 0-5 Units, Subcutaneous, QHS, Mansy, Jan A, MD   insulin aspart (novoLOG) injection 0-6 Units, 0-6 Units, Subcutaneous, TID WC, Mansy, Jan A, MD   levETIRAcetam (KEPPRA) IVPB 500 mg/100 mL premix, 500 mg, Intravenous, Q12H, Feb, MD, Last Rate: 400 mL/hr at 07/18/22 0354, 500 mg at 07/18/22 0354   LORazepam (ATIVAN) injection 1 mg, 1 mg, Intravenous, Q1H PRN, Mansy, Jan A, MD   LORazepam (ATIVAN) tablet 0.5 mg, 0.5 mg, Oral, Q4H PRN, Mansy, Jan A, MD   magnesium hydroxide (MILK OF MAGNESIA) suspension 30 mL, 30 mL, Oral, Daily PRN, Mansy, Jan A, MD   mirtazapine (REMERON SOL-TAB) disintegrating tablet 15 mg, 15 mg, Oral, QHS,  Mansy, Jan A, MD, 15 mg at 07/17/22 2145   morphine CONCENTRATE 10 MG/0.5ML oral solution 20 mg, 20 mg, Oral, Q1H PRN, Mansy, Jan A, MD   ondansetron (ZOFRAN) tablet 4 mg, 4 mg, Oral, Q6H PRN **OR** ondansetron (ZOFRAN) injection 4 mg, 4 mg, Intravenous, Q6H PRN, Mansy, Jan A, MD   oxybutynin (DITROPAN) tablet 5 mg, 5 mg, Oral, Daily, Mansy, Jan A, MD, 5 mg at 07/18/22 0944   QUEtiapine (SEROQUEL) tablet 25 mg, 25 mg, Oral, QHS, Sreenath, Sudheer B, MD, 25 mg at 07/17/22 2145    traMADol (ULTRAM) tablet 50 mg, 50 mg, Oral, Q8H PRN, Mansy, Jan A, MD   traZODone (DESYREL) tablet 25 mg, 25 mg, Oral, QHS PRN, Mansy, Jan A, MD Labs CBC    Component Value Date/Time   WBC 6.2 07/18/2022 0745   RBC 3.69 (L) 07/18/2022 0745   HGB 10.4 (L) 07/18/2022 0745   HCT 32.3 (L) 07/18/2022 0745   PLT 159 07/18/2022 0745   MCV 87.5 07/18/2022 0745   MCH 28.2 07/18/2022 0745   MCHC 32.2 07/18/2022 0745   RDW 15.2 07/18/2022 0745   LYMPHSABS 1.6 07/18/2022 0745   MONOABS 0.7 07/18/2022 0745   EOSABS 0.1 07/18/2022 0745   BASOSABS 0.0 07/18/2022 0745    CMP     Component Value Date/Time   NA 141 07/18/2022 0745   K 3.6 07/18/2022 0745   CL 113 (H) 07/18/2022 0745   CO2 23 07/18/2022 0745   GLUCOSE 106 (H) 07/18/2022 0745   BUN 16 07/18/2022 0745   CREATININE 1.26 (H) 07/18/2022 0745   CALCIUM 8.9 07/18/2022 0745   PROT 7.6 12/15/2021 0411   ALBUMIN 3.4 (L) 12/15/2021 0411   AST 47 (H) 12/15/2021 0411   ALT 49 (H) 12/15/2021 0411   ALKPHOS 106 12/15/2021 0411   BILITOT 0.5 12/15/2021 0411   GFRNONAA 45 (L) 07/18/2022 0745   GFRAA >60 01/10/2019 0302    Imaging I have reviewed images in epic and the results pertinent to this consultation are: MRI of the brain with no acute intracranial process.  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  Scattered punctate lobar microhemorrhages nonspecific but could be seen in the setting of uncontrolled hypertension as well as amyloid angiopathy, although on my personal review, I am not impressed with this being amyloid angiopathy at all.  Assessment:  71 year old with vascular dementia and multiple other cerebrovascular risk factors presents for evaluation of whole body stiffening concerning for generalized seizure, at least for a second time since March.  Prior EEG imaging was normal.  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  No seizures or epileptiform activity. Started on antiepileptics. MRI imaging  negative for stroke Also noted to be positive for UTI.  Impression:  Likely toxic metabolic encephalopathy in the setting of poor brain reserve and acute UTI. Possible seizures in the setting of underlying dementia  Recommendations: At this time, I would continue medical management per primary team as you are of the UTI and toxic metabolic derangements. Since the seizure-like activity has been seen more than once, I would be hesitant to discontinue antiepileptics at this time.  I will continue her on Keppra and have her follow-up with the outpatient neurology team to see if they would want to monitor her for little while and then discontinue her antiepileptics. Minimize sedating medications-would defer this to outpatient providers who know the patient better. Outpatient neurology follow-up in 6 to 12 weeks.  Given poor baseline, I  suspect that she will take some time prior to returning to what ever her baseline is because of the acute infection.  Inpatient neurology service will be available as needed.  Please call us with questions.  -- Amie Portland, MD Neurologist [facility] Pager: [phone]

## 2022-07-18 NOTE — Evaluation (Addendum)
Clinical/Bedside Swallow Evaluation Patient Details  Name: Leah Ritter MRN: [id number] Date of Birth: 01-Jan-1951  Today's Date: 07/18/2022 Time: SLP Start Time (ACUTE ONLY): 1530 SLP Stop Time (ACUTE ONLY): 1600 SLP Time Calculation (min) (ACUTE ONLY): 30 min  Past Medical History:  Past Medical History:  Diagnosis Date   A-fib (HCC)    Anxiety    CHF (congestive heart failure) (HCC)    Diabetes (HCC)    GERD (gastroesophageal reflux disease)    Hypertension    Sleep apnea    Stroke [facility])    Past Surgical History:  Past Surgical History:  Procedure Laterality Date   PARTIAL HYSTERECTOMY     tubligation     HPI:  Per H&P: "Leah Ritter is a 71 y.o. African-American female with medical history significant for paroxysmal atrial fibrillation, anxiety, CHF, type 2 diabetes mellitus, GERD, hypertension, CVA and obstructive sleep apnea, who presented to the emergency room with acute onset of seizure at home that was described by her family as clenching and both arms with eyes rolling backward not responding followed by confusion.  Her blood glucose was in the 40s.  She had another episode in the emergency room that was similar at that time she bit her lip." Per neurology progress note: "Prior EEG imaging was normal.  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  No seizures or epileptiform activity.  Started on antiepileptics. MRI imaging negative for stroke. Also noted to be positive for UTI." Pt is currently on thin liquids and regular solids and room air. MRI Brain 07/16/22: "No acute intracranial process.  No seizure focus identified. 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 Scattered punctate lobar microhemorrhages are nonspecific, but could be seen in the setting of cerebral amyloid angiopathy." CXR 07/15/22: "Mild cardiomegaly with mild vascular congestion. No focal consolidation."    Assessment / Plan / Recommendation  Clinical Impression  Pt with known  dysphagia. Pt seen for MBSS on 08/12/21, revealing "moderate oropharyngeal dysphagia with cognitive, sensory and motor deficits. Oral stage is characterized by disorganized lingual transport and reduced bolus containment, with premature spillage of liquids to the pyriform sinuses before the swallow. There is left sided facial, lingual weakness. Lingual pumping as pt prepares to initiate swallow, and piecemeal deglutition is noted. Penetration/aspiration occurs during the swallow intermittently with thin and nectar thick liquids due to delayed onset of swallow initiation/laryngeal closure." Further details in chart. At the time SLP recommended, "dysphagia 3, thin liquids (via limited flow cup), meds crushed in puree, or thin liquids by regular cup (no straw) with immediate cough/throat clear after each swallow." Per subsequent bedside eval completed on 12/11/21, pt had been intermittently using Provale cup to restrict sip size, with recommendation for Dys 3 and thin liquids with aspiration precautions.       Today's bedside swallow assessment consisted of trials of thin liquids (via spoon), pureed solids, and regular solids. Pt with initial cough with thin liquid, not repeated with additional trials. No other s/sx of aspiration noted. Oral phase notable for mildly prolonged mastication and min oral residue at completion of regular solids. Fluctuating alertness and cognition proved to hinder oral phase, leading to reduced labial seal for cup, pull on straw, and awareness to bolus in oral cavity. Intermittent hesitations in oral manipulation noted for solids and liquids. Extended time provided to allow for manipulation and oral clearance.       Therapist spoke to daughter via phone regarding results of assessment; dtr reported that pt was confused  with Provale cup (thinking it was a condiment dispenser and pouring liquid over tray); therefore, has not use cup recently. Daughter further endorsed use of soft solids  and aspiration precautions.       Recommend Dys 3 (mech soft) with thin liquids (via spoon vs small cup sips) with aspiration precautions (slow rate, small bites, elevated HOB, and alert for PO intake). Medication crushed in puree. Given that pt is at her dysphagia baseline and family's awareness for precautions, no further acute services indicated. Please reconsult in the event of acute change. SLP Visit Diagnosis: Dysphagia, oropharyngeal phase (R13.12)    Aspiration Risk  Moderate aspiration risk    Diet Recommendation   Mech soft (Dys 3) and thin liquids   Medication Administration: Crushed with puree    Other  Recommendations Oral Care Recommendations: Oral care BID    Recommendations for follow up therapy are one component of a multi-disciplinary discharge planning process, led by the attending physician.  Recommendations may be updated based on patient status, additional functional criteria and insurance authorization.        Functional Status Assessment Patient has not had a recent decline in their functional status    Swallow Study   General Date of Onset: 07/18/22 HPI: Per H&P: "Leah Ritter is a 71 y.o. African-American female with medical history significant for paroxysmal atrial fibrillation, anxiety, CHF, type 2 diabetes mellitus, GERD, hypertension, CVA and obstructive sleep apnea, who presented to the emergency room with acute onset of seizure at home that was described by her family as clenching and both arms with eyes rolling backward not responding followed by confusion.  Her blood glucose was in the 40s.  She had another episode in the emergency room that was similar at that time she bit her lip." Per neurology progress note: "Prior EEG imaging was normal.  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  No seizures or epileptiform activity.  Started on antiepileptics. MRI imaging negative for stroke. Also noted to be positive for UTI." Pt is currently on thin  liquids and regular solids and room air. MRI Brain 07/16/22: "No acute intracranial process.  No seizure focus identified. 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 Scattered punctate lobar microhemorrhages are nonspecific, but could be seen in the setting of cerebral amyloid angiopathy." CXR 07/15/22: "Mild cardiomegaly with mild vascular congestion. No focal consolidation." Type of Study: Bedside Swallow Evaluation Previous Swallow Assessment: last in chart on 12/11/21 Diet Prior to this Study: Regular;Thin liquids Temperature Spikes Noted: No (temp 99; WBC 6.2) Respiratory Status: Room air History of Recent Intubation: No Behavior/Cognition: Confused;Lethargic/Drowsy Oral Cavity Assessment: Within Functional Limits Oral Care Completed by SLP: Yes Oral Cavity - Dentition: Adequate natural dentition Patient Positioning: Upright in bed Baseline Vocal Quality: Normal Volitional Cough: Cognitively unable to elicit Volitional Swallow: Unable to elicit    Oral/Motor/Sensory Function Overall Oral Motor/Sensory Function: Within functional limits   Ice Chips Ice chips: Not tested   Thin Liquid Thin Liquid: Impaired Presentation: Spoon Oral Phase Impairments: Poor awareness of bolus Oral Phase Functional Implications: Oral holding;Prolonged oral transit Pharyngeal  Phase Impairments: Cough - Immediate    Nectar Thick Nectar Thick Liquid: Not tested   Honey Thick Honey Thick Liquid: Not tested   Puree Puree: Within functional limits   Solid     Solid: Impaired Presentation: Spoon Oral Phase Impairments: Poor awareness of bolus;Impaired mastication Oral Phase Functional Implications: Oral residue;Impaired mastication;Prolonged oral transit     [place] Arend Bahl Clapp  MS  CCC-SLP  [place] J Clapp 07/18/2022,5:51 PM

## 2022-07-19 DIAGNOSIS — R569 Unspecified convulsions: Secondary | ICD-10-CM | POA: Diagnosis not present

## 2022-07-19 LAB — GLUCOSE, CAPILLARY
Glucose-Capillary: 105 mg/dL — ABNORMAL HIGH (ref 70–99)
Glucose-Capillary: 96 mg/dL (ref 70–99)
Glucose-Capillary: 163 mg/dL — ABNORMAL HIGH (ref 70–99)
Glucose-Capillary: 186 mg/dL — ABNORMAL HIGH (ref 70–99)
Glucose-Capillary: 97 mg/dL (ref 70–99)
Glucose-Capillary: 160 mg/dL — ABNORMAL HIGH (ref 70–99)
Glucose-Capillary: 119 mg/dL — ABNORMAL HIGH (ref 70–99)

## 2022-07-19 NOTE — Care Management Important Message (Signed)
Important Message  Patient Details  Name: Leah Ritter MRN: [id number] Date of Birth: 1951-04-11   Medicare Important Message Given:  Yes     Dannette Barbara 07/19/2022, 12:37 PM

## 2022-07-19 NOTE — Evaluation (Signed)
Physical Therapy Evaluation Patient Details Name: Leah Ritter MRN: [id number] DOB: 25-Apr-1951 Today's Date: 07/19/2022  History of Present Illness  Pt is a 71 y.o. female presenting to hospital 10/19 with c/o seizure-like activity and urinary incontinence; pt noted with seizure like activity in ED.  Blood glucose noted in 40's.  Pt admitted with seizure, sepsis d/t gram-negative UTI, and hypoglycemia.  PMH includes a-fib on Xarelto, CHF, anxiety, htn, prior stroke, DM, and OSA.  Clinical Impression  Prior to hospital admission, pt was modified independent ambulating with rollator; lives at [facility] facility; recent h/o falls.  Currently pt is SBA semi-supine to sitting edge of bed; CGA (plus CGA of 2nd for safety) for transfers using RW; and CGA to min assist (plus CGA of 2nd for safety) for ambulation 140 feet with RW use.  Pt initially mildly unsteady ambulating with difficulty staying within walker--first 40 feet 2 loss of balance to R requiring min assist to correct--but pt's balance and mobility improved with increased distance ambulating--pt still requiring assist to turn walker for turns (anticipate d/t impaired cognition and pt typically uses 4ww instead of RW); pt reporting feeling "drunk" when walking.  Pt would benefit from skilled PT to address noted impairments and functional limitations (see below for any additional details).  Upon hospital discharge, pt would benefit from HHPT and 24/7 assist.    Recommendations for follow up therapy are one component of a multi-disciplinary discharge planning process, led by the attending physician.  Recommendations may be updated based on patient status, additional functional criteria and insurance authorization.  Follow Up Recommendations Home health PT      Assistance Recommended at Discharge Frequent or constant Supervision/Assistance  Patient can return home with the following  A little help with walking and/or transfers;A  little help with bathing/dressing/bathroom;Assistance with cooking/housework;Assistance with feeding;Direct supervision/assist for medications management;Direct supervision/assist for financial management;Assist for transportation;Help with stairs or ramp for entrance    Equipment Recommendations Rolling walker (2 wheels)  Recommendations for Other Services  OT consult    Functional Status Assessment Patient has had a recent decline in their functional status and demonstrates the ability to make significant improvements in function in a reasonable and predictable amount of time.     Precautions / Restrictions Precautions Precautions: Fall Precaution Comments: Aspiration Restrictions Weight Bearing Restrictions: No      Mobility  Bed Mobility Overal bed mobility: Needs Assistance Bed Mobility: Supine to Sit     Supine to sit: Supervision, HOB elevated     General bed mobility comments: increased effort/time for pt to perform on own    Transfers Overall transfer level: Needs assistance Equipment used: Rolling walker (2 wheels) Transfers: Sit to/from Stand, Bed to chair/wheelchair/BSC Sit to Stand: Min guard, +2 safety/equipment   Step pivot transfers: Min guard (stand step turn bed to recliner with RW use)       General transfer comment: x2 trials standing from bed    Ambulation/Gait Ambulation/Gait assistance: Min guard, +2 safety/equipment Gait Distance (Feet): 140 Feet Assistive device: Rolling walker (2 wheels)   Gait velocity: decreased     General Gait Details: pt initially mildly unsteady with difficulty staying within walker (first 40 feet 2 loss of balance to R requiring min assist to correct) but pt's balance and mobility improved with increased distance ambulating (pt still requiring assist to turn walker for turns--anticipate d/t impaired cognition and pt typically uses 4ww instead of RW); pt reporting feeling "drunk" when walking  Stairs  Wheelchair Mobility    Modified Rankin (Stroke Patients Only)       Balance Overall balance assessment: Needs assistance Sitting-balance support: No upper extremity supported, Feet supported Sitting balance-Leahy Scale: Good Sitting balance - Comments: steady sitting reaching within BOS   Standing balance support: Bilateral upper extremity supported, During functional activity, Reliant on assistive device for balance Standing balance-Leahy Scale: Poor Standing balance comment: improved balance noted with increased distance ambulating                             Pertinent Vitals/Pain Pain Assessment Pain Assessment: Faces Faces Pain Scale: No hurt Pain Intervention(s): Limited activity within patient's tolerance, Monitored during session, Repositioned Vitals (HR and O2 on room air) stable and WFL throughout treatment session.    Home Living Family/patient expects to be discharged to:: Skilled nursing facility                   Additional Comments: Lives at [facility]    Prior Function Prior Level of Function : Needs assist             Mobility Comments: Modified independent ambulating with rollator; recent falls (last one last Tuesday--usually found sitting on floor near bed; recent back pain since last fall) ADLs Comments: Wears depends; assist for bathing, changing, medications, meals, cleaning     Hand Dominance        Extremity/Trunk Assessment   Upper Extremity Assessment Upper Extremity Assessment: Defer to OT evaluation;Generalized weakness (pt reports chronic pain in shoulders)    Lower Extremity Assessment Lower Extremity Assessment: Generalized weakness (able to perform B LE SLR)    Cervical / Trunk Assessment Cervical / Trunk Assessment: Other exceptions Cervical / Trunk Exceptions: forward head/shoulders  Communication   Communication: Expressive difficulties (Difficult to understand pt's speech (pt's daughter  reports pt's speech is clearer in afternoon once muscles wake up))  Cognition Arousal/Alertness:  (Pt initially sleeping but pt's daughter woke pt with gentle cues) Behavior During Therapy: Impulsive (at times) Overall Cognitive Status: History of cognitive impairments - at baseline                                 General Comments: Oriented to person only        General Comments  Nursing cleared pt for participation in physical therapy.  Pt agreeable to PT session.  Pt's daughter present during session.    Exercises     Assessment/Plan    PT Assessment Patient needs continued PT services  PT Problem List Decreased strength;Decreased activity tolerance;Decreased balance;Decreased mobility;Decreased knowledge of use of DME;Decreased knowledge of precautions       PT Treatment Interventions DME instruction;Gait training;Functional mobility training;Therapeutic activities;Therapeutic exercise;Balance training;Patient/family education    PT Goals (Current goals can be found in the Care Plan section)  Acute Rehab PT Goals Patient Stated Goal: to improve walking PT Goal Formulation: With patient/family Time For Goal Achievement: 08/02/22 Potential to Achieve Goals: Good    Frequency Min 2X/week     Co-evaluation               AM-PAC PT "6 Clicks" Mobility  Outcome Measure Help needed turning from your back to your side while in a flat bed without using bedrails?: None Help needed moving from lying on your back to sitting on the side of a flat bed without using  bedrails?: A Little Help needed moving to and from a bed to a chair (including a wheelchair)?: A Little Help needed standing up from a chair using your arms (e.g., wheelchair or bedside chair)?: A Little Help needed to walk in hospital room?: A Little Help needed climbing 3-5 steps with a railing? : A Little 6 Click Score: 19    End of Session Equipment Utilized During Treatment: Gait belt Activity  Tolerance: Patient tolerated treatment well Patient left: in chair;with call bell/phone within reach;with chair alarm set;with family/visitor present;Other (comment) (OT present) Nurse Communication: Mobility status;Precautions;Other (comment) (nurse cleared pt to sit in recliner; OT present end of session for OT evaluation (OT to take off brief and replace with purewick end of session)) PT Visit Diagnosis: Unsteadiness on feet (R26.81);Other abnormalities of gait and mobility (R26.89);Muscle weakness (generalized) (M62.81);History of falling (Z91.81)    Time: 2878-6767 PT Time Calculation (min) (ACUTE ONLY): 45 min   Charges:   PT Evaluation $PT Eval Low Complexity: 1 Low PT Treatments $Gait Training: 8-22 mins $Therapeutic Activity: 8-22 mins       Hendricks Limes, PT 07/19/22, 11:41 AM

## 2022-07-19 NOTE — Evaluation (Signed)
Occupational Therapy Evaluation Patient Details Name: Leah Ritter MRN: [id number] DOB: 1951-07-17 Today's Date: 07/19/2022   History of Present Illness Pt is a 71 y.o. female presenting to hospital 10/19 with c/o seizure-like activity and urinary incontinence; pt noted with seizure like activity in ED.  Blood glucose noted in 40's.  Pt admitted with seizure, sepsis d/t gram-negative UTI, and hypoglycemia.  PMH includes a-fib on Xarelto, CHF, anxiety, htn, prior stroke, DM, and OSA.   Clinical Impression   Patient received for OT evaluation. See flowsheet below for details of function. Generally, patient requiring CGA for transfers, CGA for functional mobility, and set up-MOD A for ADLs. Of note pt did have one loss of balance posteriorly while in front of chair; sat with poor control onto chair; OT guiding hips for safety. Patient will benefit from continued OT while in acute care.      Recommendations for follow up therapy are one component of a multi-disciplinary discharge planning process, led by the attending physician.  Recommendations may be updated based on patient status, additional functional criteria and insurance authorization.   Follow Up Recommendations  Home health OT    Assistance Recommended at Discharge Frequent or constant Supervision/Assistance  Patient can return home with the following A lot of help with walking and/or transfers;A lot of help with bathing/dressing/bathroom;Assistance with cooking/housework;Assistance with feeding;Direct supervision/assist for medications management;Direct supervision/assist for financial management;Assist for transportation;Help with stairs or ramp for entrance    Functional Status Assessment  Patient has had a recent decline in their functional status and demonstrates the ability to make significant improvements in function in a reasonable and predictable amount of time.  Equipment Recommendations  None recommended by OT (lives at  ALF; all needs met)    Recommendations for Other Services       Precautions / Restrictions Precautions Precautions: Fall Precaution Comments: Aspiration Restrictions Weight Bearing Restrictions: No      Mobility Bed Mobility                    Transfers Overall transfer level: Needs assistance Equipment used: Rolling walker (2 wheels) Transfers: Sit to/from Stand, Bed to chair/wheelchair/BSC Sit to Stand: Min guard           General transfer comment: After pt returned to standing in front of recliner, pt had a LOB posteriorly; OT assisted to guide pt to seated position in chair. Pt able to stand once more for donning of gown around back at her preference.      Balance Overall balance assessment: Needs assistance Sitting-balance support: No upper extremity supported, Feet supported Sitting balance-Leahy Scale: Good                                     ADL either performed or assessed with clinical judgement   ADL Overall ADL's : Needs assistance/impaired;At baseline Eating/Feeding: Set up Eating/Feeding Details (indicate cue type and reason): Daughter reports pt able to self-feed today. Grooming: Wash/dry hands;Oral care;Set up;Min guard Grooming Details (indicate cue type and reason): Able to brush teeth seated in recliner with set up; OT cues for finishing brushing and moving on to rinsing. Washing hands standing at sink with CGA for safety; pt able to do all parts of task without assistance. Upper Body Bathing:  (anticipate MIN A)   Lower Body Bathing:  (Anticipate MAX A)   Upper Body Dressing : Minimal assistance (Able to assist with  donning gown; anticipate MIN A for shirt)   Lower Body Dressing: Maximal assistance (anticipated)   Toilet Transfer: Minimal assistance Toilet Transfer Details (indicate cue type and reason): Using RW to get to commode; pt unsafely trying to abandon RW prior to getting to toilet. OT able to redirect to RW with  max cues. MIN A for safe t/f to [facility] over toilet. Toileting- Clothing Manipulation and Hygiene: Maximal assistance Toileting - Clothing Manipulation Details (indicate cue type and reason): OT assistance maximally to doff brief (pt already starting to have bowel movement in brief); OT MOD A hygiene (pt able to perform hygiene; OT assisting for thoroughness).     Functional mobility during ADLs: Minimal assistance General ADL Comments: BIL UE function WNL for ADLs today; cognition is main limitation.     Vision         Perception     Praxis      Pertinent Vitals/Pain Pain Assessment Pain Assessment: No/denies pain     Hand Dominance Right   Extremity/Trunk Assessment Upper Extremity Assessment Upper Extremity Assessment: Overall WFL for tasks assessed   Lower Extremity Assessment Lower Extremity Assessment: Defer to PT evaluation   Cervical / Trunk Assessment Cervical / Trunk Assessment: Other exceptions Cervical / Trunk Exceptions: forward projection of head/shoulders with mobility   Communication Communication Communication: Expressive difficulties (garbled speech often; daughter reports clarity improves as day goes on.)   Cognition Arousal/Alertness: Awake/alert Behavior During Therapy: Impulsive Overall Cognitive Status: History of cognitive impairments - at baseline                                 General Comments: Oriented to person only (Keeps talking about [employer]. Asking for her clothes.)     General Comments  Limited by commuication and dementia; able to be understood about 50% of the time with verbal interaction.    Exercises     Shoulder Instructions      Home Living Family/patient expects to be discharged to:: Assisted living                             Home Equipment: Rollator (4 wheels)   Additional Comments: Lives at [facility]      Prior Functioning/Environment Prior Level of Function : Needs  assist  Cognitive Assist : Mobility (cognitive);ADLs (cognitive) Mobility (Cognitive): Step by step cues ADLs (Cognitive): Step by step cues Physical Assist : Mobility (physical);ADLs (physical) Mobility (physical): Gait ADLs (physical): Bathing;Dressing;Toileting;IADLs Mobility Comments: Modified independent ambulating with rollator; recent falls (last one last Tuesday--usually found sitting on floor near bed; recent back pain since last fall) ADLs Comments: Wears depends; assist for bathing, changing, medications, meals, cleaning. Per daughter, often refuses oral care- daughter has been unable to remove pt's dentures for cleaning in several months until yesterday.        OT Problem List: Impaired balance (sitting and/or standing);Decreased cognition;Decreased knowledge of use of DME or AE;Decreased safety awareness;Decreased activity tolerance      OT Treatment/Interventions: Self-care/ADL training;Therapeutic exercise;DME and/or AE instruction;Therapeutic activities;Cognitive remediation/compensation;Patient/family education    OT Goals(Current goals can be found in the care plan section) Acute Rehab OT Goals Patient Stated Goal: Go home. OT Goal Formulation: With patient/family Time For Goal Achievement: 08/02/22 Potential to Achieve Goals: Fair ADL Goals Pt Will Perform Lower Body Dressing: with mod assist Pt Will Transfer to Toilet: with min  guard assist  OT Frequency: Min 2X/week    Co-evaluation              AM-PAC OT "6 Clicks" Daily Activity     Outcome Measure Help from another person eating meals?: None Help from another person taking care of personal grooming?: A Little Help from another person toileting, which includes using toliet, bedpan, or urinal?: A Lot Help from another person bathing (including washing, rinsing, drying)?: A Lot Help from another person to put on and taking off regular upper body clothing?: A Little Help from another person to put on and  taking off regular lower body clothing?: A Lot 6 Click Score: 16   End of Session Equipment Utilized During Treatment: Rolling walker (2 wheels) Nurse Communication: Other (comment) (ADL status)  Activity Tolerance: Patient tolerated treatment well Patient left: in chair;with chair alarm set;with call bell/phone within reach;with family/visitor present  OT Visit Diagnosis: Unsteadiness on feet (R26.81)                Time: 8257-4935 OT Time Calculation (min): 30 min Charges:  OT General Charges $OT Visit: 1 Visit OT Evaluation $OT Eval Moderate Complexity: 1 Mod OT Treatments $Self Care/Home Management : 8-22 mins  Waymon Amato, MS, OTR/L   Vania Rea 07/19/2022, 12:16 PM

## 2022-07-19 NOTE — TOC Progression Note (Addendum)
Transition of Care [facility]) - Progression Note    Patient Details  Name: Leah Ritter MRN: [id number] Date of Birth: Nov 14, 1950  Transition of Care [facility]) CM/SW [place], [place] Phone Number: 07/19/2022, 3:32 PM  Clinical Narrative:        SW spoke to [facility] [phone] and the patient will need a home hospice referral faxed to [phone].  Melissa Been stated that the patient will need a NP to complete a face to face. SW is also calling [facility] regarding referral to their ALF. They may need to come to the hospital for a patient evaluation.  1628: SW left a message for admission for [facility].     Expected Discharge Plan and [place] at ALF                                         Social Determinants of Health (SDOH) Interventions    Readmission Risk Interventions     No data to display

## 2022-07-19 NOTE — Progress Notes (Deleted)
Called A/C body prepared for transport to morgue.

## 2022-07-19 NOTE — Progress Notes (Signed)
PROGRESS NOTE    Leah Ritter  OAC:[id number] DOB: 02-Jul-1951 DOA: 07/15/2022 PCP: Orvis Brill, [facility]    Brief Narrative:  71 y.o. African-American female with medical history significant for paroxysmal atrial fibrillation, anxiety, CHF, type 2 diabetes mellitus, GERD, hypertension, CVA and obstructive sleep apnea, who presented to the emergency room with acute onset of seizure at home that was described by her family as clenching and both arms with eyes rolling backward not responding followed by confusion.  Her blood glucose was in the 40s.  She had another episode in the emergency room that was similar at that time she bit her lip.  She was postictal during my interview.  She did not have any fever or chills.  No nausea or vomiting or abdominal pain.  No reported stool incontinence.  No reported chest pain or palpitations.  No cough or wheezing or dyspnea per her family.  Patient was found to be hypothermic and mildly hypotensive.  Started on intravenous Rocephin for suspected UTI.  Received extra containing fluids.  Started on Keppra.  Discussed with neurology.  Continue Keppra at this time.  Routine EEG and MRI ordered.  CBGs every 4 hours.  10/21: CBGs have improved.  Remains on D5 infusion.  Mental status slowly picking up.  MRI unrevealing.  EEG negative for epileptiform discharges.  10/22: Urine culture with actinomyces   Assessment & Plan:   Principal Problem:   Seizure ([place]) Active Problems:   Sepsis due to gram-negative UTI ([place])   Uncontrolled type 2 diabetes mellitus with hypoglycemia, without long-term current use of insulin (HCC)   Dyslipidemia   Essential hypertension   Paroxysmal atrial fibrillation (HCC)   Dementia with behavioral disturbance ([place])  Possible new onset seizure Unclear etiology.  Could have been precipitated in the setting of hypoglycemia and urinary tract infection.  Seen by neurology.  Started on Keppra. Plan: Treat infection as  below Avoid hypoglycemia Continue Keppra Seizure precautions Referral to outpatient neurology   Sepsis due to gram-negative UTI ([employer]) Likely precipitating factor to decreased appetite and subsequent hypoglycemia.  Could have resulted in seizure-like activity.  Sepsis criteria met with hypothermia and tachypnea in the setting of urinary tract infection Urine culture with actinomyces Plan: Continue IV Rocephin for now 7-day stop date in place Follow blood and urine cultures, actinomyces growing Continue hydration with D5 NS Monitor vitals and fever curve We will discuss case with infectious disease   Uncontrolled type 2 diabetes mellitus with hypoglycemia, without long-term current use of insulin (HCC) Jardiance, Glucotrol, metformin held DC D5 NS Every 4 hours CBG   Dementia with behavioral disturbance (HCC) PTA Aricept and Seroquel  Paroxysmal atrial fibrillation (HCC) PTA Xarelto and Lopressor   Essential hypertension Continue home antihypertensives as BP allows   Dyslipidemia PTA statin  DVT prophylaxis: Xarelto Code Status: DNR Family Communication: Daughter at bedside 10/21, 10/23 Disposition Plan: Status is: Inpatient Remains inpatient appropriate because: Sepsis, hypoglycemia, suspected new onset seizure   Level of care: Telemetry Medical  Consultants:  Neurology  Procedures:  None  Antimicrobials: Ceftriaxone   Subjective: Seen and examined.  Much more awake this morning.  Sitting up in bed eating pancakes.  Daughter at bedside.  Objective: Vitals:   07/18/22 2013 07/18/22 2357 07/19/22 0440 07/19/22 0754  BP: 137/78 116/65 (!) 103/52 (!) 150/68  Pulse: 72 65 60 (!) 54  Resp: _0 Temp: (!) 97.4 F (36.3 C) 98.7 F (37.1 C) 98 F (36.7 C) 98.3 F (36.8 C)  TempSrc:      SpO2: 99% 100% 97% 100%  Weight:      Height:        Intake/Output Summary (Last 24 hours) at 07/19/2022 1043 Last data filed at 07/19/2022 1015 Gross per 24  hour  Intake 5744.04 ml  Output 800 ml  Net 4944.04 ml   Filed Weights   07/16/22 0230  Weight: 96 kg    Examination:  General exam: No acute distress.  Sitting up in bed eating pancakes Respiratory system: Clear.  Normal work of breathing.  Room air Cardiovascular system: S1 S2, RRR, no murmurs, no pedal edema Gastrointestinal system: Soft, NT/ND, normal bowel sounds Central nervous system: Alert, x1, no focal deficits Extremities: 5 5 strength Skin: No rashes, lesions or ulcers Psychiatry: Mood appropriate.  Affect confused    Data Reviewed: I have personally reviewed following labs and imaging studies  CBC: Recent Labs  Lab 07/15/22 2229 07/16/22 0403 07/17/22 0958 07/18/22 0745  WBC 5.6 4.7 5.1 6.2  NEUTROABS 4.5  --  3.1 3.7  HGB 11.9* 10.1* 10.8* 10.4*  HCT 38.0 32.4* 34.1* 32.3*  MCV 89.8 90.0 88.8 87.5  PLT 175 154 150 268   Basic Metabolic Panel: Recent Labs  Lab 07/15/22 2229 07/16/22 0403 07/17/22 0958 07/18/22 0745  NA 139 137 139 141  K 5.1 3.9 3.9 3.6  CL 104 103 110 113*  CO2 _0 GLUCOSE 48* 226* 78 106*  BUN 33* 32* 22 16  CREATININE 1.28* 1.15* 1.09* 1.26*  CALCIUM 8.9 8.6* 8.8* 8.9   GFR: Estimated Creatinine Clearance: 49.8 mL/min (A) (by C-G formula based on SCr of 1.26 mg/dL (H)). Liver Function Tests: No results for input(s): "AST", "ALT", "ALKPHOS", "BILITOT", "PROT", "ALBUMIN" in the last 168 hours. No results for input(s): "LIPASE", "AMYLASE" in the last 168 hours. No results for input(s): "AMMONIA" in the last 168 hours. Coagulation Profile: No results for input(s): "INR", "PROTIME" in the last 168 hours. Cardiac Enzymes: No results for input(s): "CKTOTAL", "CKMB", "CKMBINDEX", "TROPONINI" in the last 168 hours. BNP (last 3 results) No results for input(s): "PROBNP" in the last 8760 hours. HbA1C: No results for input(s): "HGBA1C" in the last 72 hours.  CBG: Recent Labs  Lab 07/18/22 1643 07/18/22 1949  07/18/22 2356 07/19/22 0444 07/19/22 0756  GLUCAP 175* 194* 147* 105* 96   Lipid Profile: No results for input(s): "CHOL", "HDL", "LDLCALC", "TRIG", "CHOLHDL", "LDLDIRECT" in the last 72 hours. Thyroid Function Tests: No results for input(s): "TSH", "T4TOTAL", "FREET4", "T3FREE", "THYROIDAB" in the last 72 hours.  Anemia Panel: No results for input(s): "VITAMINB12", "FOLATE", "FERRITIN", "TIBC", "IRON", "RETICCTPCT" in the last 72 hours. Sepsis Labs: Recent Labs  Lab 07/15/22 2353 07/16/22 0403  LATICACIDVEN 1.1 1.9    Recent Results (from the past 240 hour(s))  Urine Culture     Status: Abnormal   Collection Time: 07/15/22 11:53 PM   Specimen: In/Out Cath Urine  Result Value Ref Range Status   Specimen Description   Final    IN/OUT CATH URINE Performed at [facility], [address] [place], [place] [address]    Special Requests   Final    NONE Performed at [facility], [address] [place], [place] [address]    Culture (A)  Final    >=100,000 COLONIES/mL ACTINOMYCES SPECIES Standardized susceptibility testing for this organism is not available. Performed at [facility], [place] [address] [place], [place] [address]    Report Status  07/18/2022 FINAL  Final  Culture, blood (x 2)     Status: None (Preliminary result)   Collection Time: 07/16/22  4:04 AM   Specimen: BLOOD  Result Value Ref Range Status   Specimen Description BLOOD LEFT ANTECUBITAL  Final   Special Requests   Final    BOTTLES DRAWN AEROBIC AND ANAEROBIC Blood Culture adequate volume   Culture   Final    NO GROWTH 3 DAYS Performed at [facility], [address], [place], [place] [address]    Report Status PENDING  Incomplete  Culture, blood (x 2)     Status: None (Preliminary result)   Collection Time: 07/16/22  4:04 AM   Specimen: BLOOD  Result Value Ref Range Status   Specimen Description BLOOD BLOOD RIGHT FOREARM  Final   Special Requests   Final     BOTTLES DRAWN AEROBIC AND ANAEROBIC Blood Culture results may not be optimal due to an excessive volume of blood received in culture bottles   Culture   Final    NO GROWTH 3 DAYS Performed at [facility], [address] [place], [place] [address]    Report Status PENDING  Incomplete         Radiology Studies: No results found.      Scheduled Meds:  amLODipine  5 mg Oral Daily   atorvastatin  80 mg Oral q1800   enoxaparin (LOVENOX) injection  0.5 mg/kg Subcutaneous Q24H   feeding supplement  237 mL Oral TID BM   insulin aspart  0-5 Units Subcutaneous QHS   insulin aspart  0-6 Units Subcutaneous TID WC   mirtazapine  15 mg Oral QHS   oxybutynin  5 mg Oral Daily   QUEtiapine  25 mg Oral QHS   Continuous Infusions:  cefTRIAXone (ROCEPHIN)  IV 2 g (07/18/22 1242)   levETIRAcetam Stopped (07/19/22 0410)     LOS: 3 days     Sidney Ace, MD [facility]   If 7PM-7AM, please contact night-coverage  07/19/2022, 10:43 AM

## 2022-07-20 DIAGNOSIS — R569 Unspecified convulsions: Secondary | ICD-10-CM | POA: Diagnosis not present

## 2022-07-20 LAB — URINALYSIS, COMPLETE (UACMP) WITH MICROSCOPIC
Bacteria, UA: NONE SEEN
Bilirubin Urine: NEGATIVE
Glucose, UA: >=500 mg/dL — AB
Hgb urine dipstick: NEGATIVE
Ketones, ur: NEGATIVE mg/dL
Nitrite: NEGATIVE
Protein, ur: NEGATIVE mg/dL
Specific Gravity, Urine: 1.017 (ref 1.005–1.030)
WBC, UA: >50 WBC/hpf — ABNORMAL HIGH (ref 0–5)
pH: 6 (ref 5.0–8.0)

## 2022-07-20 LAB — GLUCOSE, CAPILLARY
Glucose-Capillary: 114 mg/dL — ABNORMAL HIGH (ref 70–99)
Glucose-Capillary: 76 mg/dL (ref 70–99)
Glucose-Capillary: 138 mg/dL — ABNORMAL HIGH (ref 70–99)
Glucose-Capillary: 118 mg/dL — ABNORMAL HIGH (ref 70–99)
Glucose-Capillary: 88 mg/dL (ref 70–99)
Glucose-Capillary: 154 mg/dL — ABNORMAL HIGH (ref 70–99)

## 2022-07-20 MED ORDER — LEVETIRACETAM 500 MG PO TABS
500.0000 mg | ORAL_TABLET | Freq: Two times a day (BID) | ORAL | Status: DC
  Administered 2022-07-20: 500 mg via ORAL
  Filled 2022-07-20: qty 1

## 2022-07-20 MED ORDER — CEFDINIR 300 MG PO CAPS
300.0000 mg | ORAL_CAPSULE | Freq: Two times a day (BID) | ORAL | Status: DC
  Administered 2022-07-20 – 2022-07-21 (×3): 300 mg via ORAL
  Filled 2022-07-20 (×3): qty 1

## 2022-07-20 NOTE — Care Management (Signed)
Spoke to [employer] via phone.  Isa Rankin was concerned that patient was apparently retaining urine.  It appears that once patient was able to get to the bedside commode she was able to void with no postvoid residual.  Suspect that urinary retention is a consequence acute illness in the hospital.  Repeat urinalysis is not indicative of infection.  Unclear etiology of actinomyces noted on previous urine culture.  Will elect to treat with 7-day course of antibiotic .  Transition to p.o. cefdinir.  Also daughter had concerns about patient's weakness and lethargy today.  She seems to be more alert and awake and active yesterday.  Do not suspect worsening infection but could be secondary to [place].  Considering that patient has no history of epilepsy, routine EEG negative for epileptiform activity I do think it is reasonable to discontinue Keppra and monitor overnight.  Maintain seizure precautions.  As needed Ativan IV for breakthrough seizure.  Ralene Muskrat MD

## 2022-07-20 NOTE — Progress Notes (Addendum)
PROGRESS NOTE    Leah Ritter  ACZ:[id number] DOB: 10/15/1950 DOA: 07/15/2022 PCP: Orvis Brill, [facility]    Brief Narrative:  71 y.o. African-American female with medical history significant for paroxysmal atrial fibrillation, anxiety, CHF, type 2 diabetes mellitus, GERD, hypertension, CVA and obstructive sleep apnea, who presented to the emergency room with acute onset of seizure at home that was described by her family as clenching and both arms with eyes rolling backward not responding followed by confusion.  Her blood glucose was in the 40s.  She had another episode in the emergency room that was similar at that time she bit her lip.  She was postictal during my interview.  She did not have any fever or chills.  No nausea or vomiting or abdominal pain.  No reported stool incontinence.  No reported chest pain or palpitations.  No cough or wheezing or dyspnea per her family.  Patient was found to be hypothermic and mildly hypotensive.  Started on intravenous Rocephin for suspected UTI.  Received extra containing fluids.  Started on Keppra.  Discussed with neurology.  Continue Keppra at this time.  Routine EEG and MRI ordered.  CBGs every 4 hours.  10/21: CBGs have improved.  Remains on D5 infusion.  Mental status slowly picking up.  MRI unrevealing.  EEG negative for epileptiform discharges.  10/22: Urine culture with actinomyces 10/24: Repeat urine culture not indicative of acute infection   Assessment & Plan:   Principal Problem:   Seizure ([place]) Active Problems:   Sepsis due to gram-negative UTI ([place])   Uncontrolled type 2 diabetes mellitus with hypoglycemia, without long-term current use of insulin (HCC)   Dyslipidemia   Essential hypertension   Paroxysmal atrial fibrillation (HCC)   Dementia with behavioral disturbance ([place])  Possible new onset seizure Unclear etiology.  Could have been precipitated in the setting of hypoglycemia and urinary tract infection.  Seen by  neurology.  Started on Keppra. Plan: Treat infection as below Avoid hypoglycemia Change Keppra to p.o., 500 twice daily Seizure precautions Referral to outpatient neurology   Sepsis due to gram-negative UTI ([place]) Likely precipitating factor to decreased appetite and subsequent hypoglycemia.  Could have resulted in seizure-like activity.  Sepsis criteria met with hypothermia and tachypnea in the setting of urinary tract infection Urine culture with actinomyces Plan: Discontinue IV Rocephin Start p.o. cefdinir, plan for 7-day antibiotic course No further IV hydration Monitor vitals and fever curve Medically stable for discharge back to assisted living facility   Uncontrolled type 2 diabetes mellitus with hypoglycemia, without long-term current use of insulin (HCC) Jardiance, Glucotrol, metformin held Continue every 4 hours CBGs No IV fluids at this time   Dementia with behavioral disturbance (HCC) PTA Aricept and Seroquel  Paroxysmal atrial fibrillation ([place]) PTA Xarelto and Lopressor   Essential hypertension Continue home antihypertensives as BP allows   Dyslipidemia PTA statin  DVT prophylaxis: Xarelto Code Status: DNR Family Communication: Daughter at bedside 10/21, 10/23, 10/24 Disposition Plan: Status is: Inpatient Remains inpatient appropriate because: Unsafe discharge plan.  Patient needs to return to her assisted living facility.  Apparently per TOC, ALF will need to send out a staff member to evaluate at bedside.  This has been delayed for unclear reasons.   Level of care: Telemetry Medical  Consultants:  Neurology  Procedures:  None  Antimicrobials: Omnicef   Subjective: Seen and examined.  More awake this morning.  Objective: Vitals:   07/19/22 1540 07/19/22 2033 07/20/22 0442 07/20/22 0818  BP: (!) 118/58 (!) 164/87 Marland Kitchen)  156/79 (!) 155/76  Pulse: 62 64 (!) 57 (!) 55  Resp: $Remo'18 20 16 18  'QqFNw$ Temp: (!) 97.5 F (36.4 C) 98.4 F (36.9 C) 98.1 F (36.7  C)   TempSrc:      SpO2: 100% 100% 100% 98%  Weight:      Height:        Intake/Output Summary (Last 24 hours) at 07/20/2022 1505 Last data filed at 07/20/2022 1239 Gross per 24 hour  Intake 400 ml  Output 450 ml  Net -50 ml   Filed Weights   07/16/22 0230  Weight: 96 kg    Examination:  General exam: No acute distress Respiratory system: Lungs clear.  No work of breathing.  Room air Cardiovascular system: S1 S2, RRR, no murmurs, no pedal edema Gastrointestinal system: Soft, NT/ND, normal bowel sounds Central nervous system: Alert, x1, no focal deficits Extremities: 5 5 strength Skin: No rashes, lesions or ulcers Psychiatry: Mood appropriate.  Affect confused    Data Reviewed: I have personally reviewed following labs and imaging studies  CBC: Recent Labs  Lab 07/15/22 2229 07/16/22 0403 07/17/22 0958 07/18/22 0745  WBC 5.6 4.7 5.1 6.2  NEUTROABS 4.5  --  3.1 3.7  HGB 11.9* 10.1* 10.8* 10.4*  HCT 38.0 32.4* 34.1* 32.3*  MCV 89.8 90.0 88.8 87.5  PLT 175 154 150 482   Basic Metabolic Panel: Recent Labs  Lab 07/15/22 2229 07/16/22 0403 07/17/22 0958 07/18/22 0745  NA 139 137 139 141  K 5.1 3.9 3.9 3.6  CL 104 103 110 113*  CO2 $Re'27 28 24 23  'VkY$ GLUCOSE 48* 226* 78 106*  BUN 33* 32* 22 16  CREATININE 1.28* 1.15* 1.09* 1.26*  CALCIUM 8.9 8.6* 8.8* 8.9   GFR: Estimated Creatinine Clearance: 49.8 mL/min (A) (by C-G formula based on SCr of 1.26 mg/dL (H)). Liver Function Tests: No results for input(s): "AST", "ALT", "ALKPHOS", "BILITOT", "PROT", "ALBUMIN" in the last 168 hours. No results for input(s): "LIPASE", "AMYLASE" in the last 168 hours. No results for input(s): "AMMONIA" in the last 168 hours. Coagulation Profile: No results for input(s): "INR", "PROTIME" in the last 168 hours. Cardiac Enzymes: No results for input(s): "CKTOTAL", "CKMB", "CKMBINDEX", "TROPONINI" in the last 168 hours. BNP (last 3 results) No results for input(s): "PROBNP" in the  last 8760 hours. HbA1C: No results for input(s): "HGBA1C" in the last 72 hours.  CBG: Recent Labs  Lab 07/19/22 2329 07/20/22 0436 07/20/22 0751 07/20/22 1117 07/20/22 1433  GLUCAP 119* 114* 76 138* 118*   Lipid Profile: No results for input(s): "CHOL", "HDL", "LDLCALC", "TRIG", "CHOLHDL", "LDLDIRECT" in the last 72 hours. Thyroid Function Tests: No results for input(s): "TSH", "T4TOTAL", "FREET4", "T3FREE", "THYROIDAB" in the last 72 hours.  Anemia Panel: No results for input(s): "VITAMINB12", "FOLATE", "FERRITIN", "TIBC", "IRON", "RETICCTPCT" in the last 72 hours. Sepsis Labs: Recent Labs  Lab 07/15/22 2353 07/16/22 0403  LATICACIDVEN 1.1 1.9    Recent Results (from the past 240 hour(s))  Urine Culture     Status: Abnormal   Collection Time: 07/15/22 11:53 PM   Specimen: In/Out Cath Urine  Result Value Ref Range Status   Specimen Description   Final    IN/OUT CATH URINE Performed at [facility], [address], [place], [place] [address]    Special Requests   Final    NONE Performed at [facility], [address], [place], [place] [address]    Culture (A)  Final    >=100,000 COLONIES/mL ACTINOMYCES  SPECIES Standardized susceptibility testing for this organism is not available. Performed at [facility], [place] [address] [place], [place] [address]    Report Status 07/18/2022 FINAL  Final  Culture, blood (x 2)     Status: None (Preliminary result)   Collection Time: 07/16/22  4:04 AM   Specimen: BLOOD  Result Value Ref Range Status   Specimen Description BLOOD LEFT ANTECUBITAL  Final   Special Requests   Final    BOTTLES DRAWN AEROBIC AND ANAEROBIC Blood Culture adequate volume   Culture   Final    NO GROWTH 4 DAYS Performed at [facility], [address], [place], [place] [address]    Report Status PENDING  Incomplete  Culture, blood (x 2)     Status: None (Preliminary result)   Collection Time: 07/16/22  4:04 AM    Specimen: BLOOD  Result Value Ref Range Status   Specimen Description BLOOD BLOOD RIGHT FOREARM  Final   Special Requests   Final    BOTTLES DRAWN AEROBIC AND ANAEROBIC Blood Culture results may not be optimal due to an excessive volume of blood received in culture bottles   Culture   Final    NO GROWTH 4 DAYS Performed at [facility], [address], [place], [place] [address]    Report Status PENDING  Incomplete         Radiology Studies: No results found.      Scheduled Meds:  amLODipine  5 mg Oral Daily   atorvastatin  80 mg Oral q1800   cefdinir  300 mg Oral Q12H   enoxaparin (LOVENOX) injection  0.5 mg/kg Subcutaneous Q24H   feeding supplement  237 mL Oral TID BM   insulin aspart  0-5 Units Subcutaneous QHS   insulin aspart  0-6 Units Subcutaneous TID WC   levETIRAcetam  500 mg Oral BID   mirtazapine  15 mg Oral QHS   oxybutynin  5 mg Oral Daily   QUEtiapine  25 mg Oral QHS   Continuous Infusions:     LOS: 4 days     Sidney Ace, MD [facility]   If 7PM-7AM, please contact night-coverage  07/20/2022, 3:05 PM

## 2022-07-20 NOTE — TOC Progression Note (Addendum)
Transition of Care [facility]) - Progression Note    Patient Details  Name: Leah Ritter MRN: [id number] Date of Birth: [birth year]  Transition of Care [facility]) CM/SW [place], [place] Phone Number: 07/20/2022, 10:44 AM  Clinical Narrative:     SW spoke to [employer] from [facility] and she stated that she would try to visit [facility]F to help expedite the discharge.  SW left an additional voicemail for [facility] [phone].  8185: SW spoke to the director of [facility] explaining that the patient is attempting to be readmitted to the [facility]F. She explained that [place] works at the [place] [facility] and assists with readmissions.   The ALF will need to visit the patient for an assessment. SW is sending progress notes to the ALF via fax.  1606: SW re faxed forms to [facility] fax number [phone].      Expected Discharge Plan and [facility] with [facility]                                         Social Determinants of Health (SDOH) Interventions    Readmission Risk Interventions     No data to display

## 2022-07-21 ENCOUNTER — Encounter: Payer: Self-pay | Admitting: Family Medicine

## 2022-07-21 DIAGNOSIS — E11649 Type 2 diabetes mellitus with hypoglycemia without coma: Secondary | ICD-10-CM

## 2022-07-21 DIAGNOSIS — A4150 Gram-negative sepsis, unspecified: Principal | ICD-10-CM

## 2022-07-21 DIAGNOSIS — I10 Essential (primary) hypertension: Secondary | ICD-10-CM | POA: Diagnosis not present

## 2022-07-21 DIAGNOSIS — I480 Paroxysmal atrial fibrillation: Secondary | ICD-10-CM

## 2022-07-21 DIAGNOSIS — R569 Unspecified convulsions: Secondary | ICD-10-CM | POA: Diagnosis not present

## 2022-07-21 DIAGNOSIS — N390 Urinary tract infection, site not specified: Secondary | ICD-10-CM

## 2022-07-21 LAB — CULTURE, BLOOD (ROUTINE X 2)
Culture: NO GROWTH
Culture: NO GROWTH
Special Requests: ADEQUATE

## 2022-07-21 LAB — URINE CULTURE: Culture: >=100000 — AB

## 2022-07-21 LAB — GLUCOSE, CAPILLARY
Glucose-Capillary: 154 mg/dL — ABNORMAL HIGH (ref 70–99)
Glucose-Capillary: 232 mg/dL — ABNORMAL HIGH (ref 70–99)
Glucose-Capillary: 81 mg/dL (ref 70–99)
Glucose-Capillary: 90 mg/dL (ref 70–99)

## 2022-07-21 MED ORDER — CEFDINIR 300 MG PO CAPS: 300.0000 mg | ORAL_CAPSULE | Freq: Two times a day (BID) | ORAL | 0 refills | Status: AC

## 2022-07-21 MED ORDER — FUROSEMIDE 20 MG PO TABS
30.0000 mg | ORAL_TABLET | Freq: Every day | ORAL | Status: DC
  Administered 2022-07-21: 30 mg via ORAL
  Filled 2022-07-21: qty 2

## 2022-07-21 NOTE — TOC Transition Note (Addendum)
Transition of Care [facility]) - CM/SW Discharge Note   Patient Details  Name: Leah Ritter MRN: [id number] Date of Birth: 11-10-1950  Transition [facility]) CM/SW Contact:  Colen Darling, [place] Phone Number: 07/21/2022, 12:33 PM   Clinical Narrative:     SW faxing home hospice order to [employer] at [facility]. ALF evaluation completed this morning. SW is faxing discharge summary to ALF and home hospice. Daughter will provide transport.  1250: SW called Melissa Been at [facility] and a NP at the ALF was able to provide the home hospice order.  1509: Discharge order faxed to [facility] [phone]. MD sending prescription to the pharmacy.  Final next level of care: Assisted Living     Patient Goals and CMS Choice Patient states their goals for this hospitalization and ongoing recovery are:: ALF with Home Hospice with [facility]   Choice offered to / list presented to : Adult Children  Discharge Placement                  Name of family member notified: Faith Rogue [phone] Patient and family notified of of transfer: 07/21/22  Discharge Plan and Services                     Home hospice at ALF                Social Determinants of Health (SDOH) Interventions     Readmission Risk Interventions     No data to display

## 2022-07-21 NOTE — Progress Notes (Signed)
Physical Therapy Treatment Patient Details Name: Leah Ritter MRN: [id number] DOB: 04-24-1951 Today's Date: 07/21/2022   History of Present Illness Pt is a 71 y.o. female presenting to hospital 10/19 with c/o seizure-like activity and urinary incontinence; pt noted with seizure like activity in ED.  Blood glucose noted in 40's.  Pt admitted with seizure, sepsis d/t gram-negative UTI, and hypoglycemia.  PMH includes a-fib on Xarelto, CHF, anxiety, htn, prior stroke, DM, and OSA.    PT Comments    Pt received upright at EOB with family present in room.  Agreeable to PT. Pt able to stand with minguard to RW and ambulate to bathroom. Pt voids bladder and is indep with perihygiene with set up assist from daughter with toilet paper. Pt stands from elevated toilet seat with supervision and ambulates ~ 180' with RW and close supervision. No LOB noted this session. Pt maintains RW close to BOS and keeps feet inside BOS of RW during turns. Pt following single step commands with increased time and intermittent hand over hand cues on RW to assist in direction changes due to baseline cognitive deficits. Pt returning to room with safe turning with transfer from Stand to sit in recliner. Per daughter, pt grossly at baseline level of functioning. Will continue to rec [facility] PT at d/c to ensure safe transition back to ALF.   Recommendations for follow up therapy are one component of a multi-disciplinary discharge planning process, led by the attending physician.  Recommendations may be updated based on patient status, additional functional criteria and insurance authorization.  Follow Up Recommendations  Home health PT     Assistance Recommended at Discharge Frequent or constant Supervision/Assistance  Patient can return home with the following A little help with walking and/or transfers;A little help with bathing/dressing/bathroom;Assistance with cooking/housework;Assistance with feeding;Direct supervision/assist  for medications management;Direct supervision/assist for financial management;Assist for transportation;Help with stairs or ramp for entrance   Equipment Recommendations  Rolling walker (2 wheels)    Recommendations for Other Services       Precautions / Restrictions Precautions Precautions: Fall Restrictions Weight Bearing Restrictions: No     Mobility  Bed Mobility               General bed mobility comments: NT. Seated EOB and then recliner during session Patient Response: Cooperative  Transfers Overall transfer level: Needs assistance Equipment used: Rolling walker (2 wheels) Transfers: Sit to/from Stand Sit to Stand: Supervision, Min guard                Ambulation/Gait Ambulation/Gait assistance: Min guard Gait Distance (Feet): 180 Feet Assistive device: Rolling walker (2 wheels) Gait Pattern/deviations: Step-through pattern, Decreased step length - right, Decreased step length - left       General Gait Details: Improved safety with RW keeping clsoer to BOS, keeping feet inside BOS of RW with turns. Intermittent VC's, hand over hand cuing with RW to assist in following directions turning in hallway. No LOB throughout gait.   Stairs             Wheelchair Mobility    Modified Rankin (Stroke Patients Only)       Balance Overall balance assessment: Needs assistance Sitting-balance support: No upper extremity supported, Feet supported Sitting balance-Leahy Scale: Good     Standing balance support: Bilateral upper extremity supported, During functional activity, Reliant on assistive device for balance Standing balance-Leahy Scale: Fair Standing balance comment: Can stand statically without UE support on RW  Cognition Arousal/Alertness: Awake/alert Behavior During Therapy: WFL for tasks assessed/performed Overall Cognitive Status: History of cognitive impairments - at baseline                                  General Comments: Following single step commands with increased time, visual cuing        Exercises      General Comments        Pertinent Vitals/Pain Pain Assessment Pain Assessment: Faces Faces Pain Scale: No hurt    Home Living                          Prior Function            PT Goals (current goals can now be found in the care plan section) Acute Rehab PT Goals Patient Stated Goal: to improve walking Time For Goal Achievement: 08/02/22 Progress towards PT goals: Progressing toward goals    Frequency    Min 2X/week      PT Plan Current plan remains appropriate    Co-evaluation              AM-PAC PT "6 Clicks" Mobility   Outcome Measure  Help needed turning from your back to your side while in a flat bed without using bedrails?: None Help needed moving from lying on your back to sitting on the side of a flat bed without using bedrails?: A Little Help needed moving to and from a bed to a chair (including a wheelchair)?: A Little Help needed standing up from a chair using your arms (e.g., wheelchair or bedside chair)?: A Little Help needed to walk in hospital room?: A Little Help needed climbing 3-5 steps with a railing? : A Little 6 Click Score: 19    End of Session Equipment Utilized During Treatment: Gait belt Activity Tolerance: Patient tolerated treatment well Patient left: in chair;with call bell/phone within reach;with chair alarm set;with family/visitor present Nurse Communication: Mobility status PT Visit Diagnosis: Unsteadiness on feet (R26.81);Other abnormalities of gait and mobility (R26.89);Muscle weakness (generalized) (M62.81);History of falling (Z91.81)     Time: 5027-7412 PT Time Calculation (min) (ACUTE ONLY): 26 min  Charges:                        Delphia Grates. Fairly IV, PT, DPT Physical Therapist- [place]  [facility]  07/21/2022, 11:06 AM

## 2022-07-21 NOTE — TOC Progression Note (Signed)
Transition of Care [facility]) - Progression Note    Patient Details  Name: Leah Ritter MRN: [id number] Date of Birth: 1951/08/03  Transition of Care [facility]) CM/SW [place], [place] Phone Number: 07/21/2022, 9:19 AM  Clinical Narrative:      SW has been attempting to expedite this discharge plan. SW spoke to the director of [facility] yesterday asking why the admissions and social worker had not returned phone calls form [facility] staff. Someone named Adella Nissen [phone] is now assigned to this patient at the ALF.  SW faxed the referral to the ALF yesterday after days of attempting to reach the admissions staff. SW asked ALF director about communication difficulties with her staff. [facility] has also been trying to reach the ALF.  SW will reach out to ALF again today to ask for an update on the referral that was faxed yesterday.      Expected Discharge Plan and [place] [facility] and [facility]                                           Social Determinants of Health (SDOH) Interventions    Readmission Risk Interventions     No data to display

## 2022-07-21 NOTE — Discharge Summary (Signed)
Physician Discharge Summary   Patient: Leah Ritter MRN: [id number] DOB: June 25, 1951  Admit date:     07/15/2022  Discharge date:   Discharge Physician: Lurene Shadow   PCP: [facility]   Recommendations at discharge:   Follow-up with hospice hemodialysis tolerating facility Follow-up with neurologist in 6 to 12 weeks if desired  Discharge Diagnoses: Principal Problem:   Seizure [facility]) Active Problems:   Sepsis due to gram-negative UTI (HCC)   Uncontrolled type 2 diabetes mellitus with hypoglycemia, without long-term current use of insulin (HCC)   Dyslipidemia   Essential hypertension   Paroxysmal atrial fibrillation (HCC)   Dementia with behavioral disturbance (HCC)  Resolved Problems:   * No resolved hospital problems. *  Hospital Course:  [name] is a 71 year old woman with medical history significant for paroxysmal atrial fibrillation, dementia, anxiety, chronic diastolic CHF, type II DM, GERD, hypertension, CVA, OSA, who presented to the hospital with acute onset of seizure (described by family as clenching both hands and eyes rolling backward followed by confusion).  Her blood glucose was in the 40s.  She had another episode of seizure in the emergency room and apparently bit her lip.  She was hypothermic and mildly hypotensive.  She was admitted to the hospital for sepsis secondary to UTI, seizure and hypoglycemia.  She was treated with empiric IV ceftriaxone and IV fluids.  Hypoglycemia was treated with IV dextrose.  She was started on Keppra for seizure.  Neurologist was consulted to assist with management.  Eventually, Keppra was discontinued because her daughter was concerned that it was making patient too sleepy.  It is presumed that seizure was provoked, likely from hypoglycemia.  Urine culture showed actinomyces species.  Patient will be discharged on Omnicef to complete treatment.  Her condition has improved and she is deemed stable for discharge  today.  Patient will be discharged to the ALF where she will continue with hospice services.  Discharge plan was discussed with patient, Leah Ritter(daughter) and Leah Ritter (ALF representative) at the bedside       Consultants: Neurologist Procedures performed: EEG did not show any epileptiform activity Disposition:  Hospice care at ALF Diet recommendation:  Discharge Diet Orders (From admission, onward)     Start     Ordered   07/21/22 0000  Diet - low sodium heart healthy        07/21/22 1205   07/21/22 0000  Diet Carb Modified        07/21/22 1205           Cardiac and Carb modified diet DISCHARGE MEDICATION: Allergies as of 07/21/2022       Reactions   Oxycodone-acetaminophen Nausea And Vomiting   Other reaction(s): Vomiting   Ezetimibe Other (See Comments)   Reaction unknown   Lisinopril Other (See Comments)   Reaction unknown Other reaction(s): Unknown   Statins Other (See Comments)   Reaction unknown         Medication List     STOP taking these medications    ALPRAZolam 0.25 MG tablet Commonly known as: XANAX   aspirin EC 81 MG tablet   glipiZIDE 10 MG tablet Commonly known as: GLUCOTROL   Jardiance 10 MG Tabs tablet Generic drug: empagliflozin   metoprolol tartrate 50 MG tablet Commonly known as: LOPRESSOR   multivitamin with minerals Tabs tablet   Myrbetriq 25 MG Tb24 tablet Generic drug: mirabegron ER   nystatin cream Commonly known as: MYCOSTATIN   polyethylene glycol 17 g packet Commonly known  as: MIRALAX / GLYCOLAX   Vitamin D (Ergocalciferol) 50 MCG (2000 UT) Caps   vitamin D3 25 MCG tablet Commonly known as: CHOLECALCIFEROL   Xarelto 20 MG Tabs tablet Generic drug: rivaroxaban       TAKE these medications    amLODipine 5 MG tablet Commonly known as: NORVASC Take 1 tablet (5 mg total) by mouth daily.   atorvastatin 80 MG tablet Commonly known as: LIPITOR Take 80 mg by mouth daily at 6 PM.   bisacodyl 10 MG  suppository Commonly known as: DULCOLAX Place 10 mg rectally as needed for moderate constipation.   cefdinir 300 MG capsule Commonly known as: OMNICEF Take 1 capsule (300 mg total) by mouth every 12 (twelve) hours for 4 doses.   Contour Next Test test strip Generic drug: glucose blood 1 each by Other route every evening.   feeding supplement Liqd Take 237 mLs by mouth 3 (three) times daily between meals.   furosemide 20 MG tablet Commonly known as: LASIX Take 30 mg by mouth daily.   ibuprofen 600 MG tablet Commonly known as: ADVIL Take 600 mg by mouth every 6 (six) hours as needed.   ketoconazole 2 % shampoo Commonly known as: NIZORAL Apply 1 Application topically 2 (two) times a week.   LORazepam 0.5 MG tablet Commonly known as: ATIVAN Take 0.5 mg by mouth every 4 (four) hours as needed for seizure.   mirtazapine 15 MG disintegrating tablet Commonly known as: REMERON SOL-TAB Take 1 tablet (15 mg total) by mouth at bedtime.   morphine CONCENTRATE 10 mg / 0.5 ml concentrated solution Take 20 mg by mouth every hour as needed for severe pain or shortness of breath.   NON FORMULARY 2 (two) times daily as needed.   oxybutynin 5 MG tablet Commonly known as: DITROPAN Take 5 mg by mouth daily.   QUEtiapine 50 MG tablet Commonly known as: SEROQUEL Take 25 mg by mouth at bedtime.   traMADol 50 MG tablet Commonly known as: ULTRAM Take 50 mg by mouth every 8 (eight) hours as needed.        Discharge Exam: Filed Weights   07/16/22 0230  Weight: 96 kg   GEN: NAD SKIN: Warm and dry EYES: EOMI ENT: MMM CV: RRR PULM: CTA B ABD: soft, obese, NT, +BS CNS: AAO x 1 (person), non focal EXT: No edema or tenderness   Condition at discharge: stable  The results of significant diagnostics from this hospitalization (including imaging, microbiology, ancillary and laboratory) are listed below for reference.   Imaging Studies: MR BRAIN WO CONTRAST  Result Date:  07/16/2022 CLINICAL DATA:  Seizure EXAM: MRI HEAD WITHOUT CONTRAST TECHNIQUE: Multiplanar, multiecho pulse sequences of the brain and surrounding structures were obtained without intravenous contrast. COMPARISON:  CT head 07/16/2019 FINDINGS: Brain: No acute infarction, hemorrhage, hydrocephalus, extra-axial collection or mass lesion. 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 Sequela of moderate chronic microvascular ischemic change. There are scattered punctate lobar microhemorrhages, for example in the right temporal lobe (series 17, image 25), right occipital lobe (series 17, image 27). Vascular: Normal flow voids. Skull and upper cervical spine: Normal marrow signal. Sinuses/Orbits: Negative. Other: None. IMPRESSION: 1. No acute intracranial process.  No seizure focus identified. 2. 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 3. Scattered punctate lobar microhemorrhages are nonspecific, but could be seen in the setting of cerebral amyloid angiopathy. Electronically Signed   By: Lorenza Cambridge M.D.   On: 07/16/2022 11:58   EEG adult  Result Date: 07/16/2022 Lora Havens, MD     07/16/2022 11:05 AM Patient Name: Leah Ritter MRN: [id number] Epilepsy Attending: Lora Havens Referring Physician/Provider: Christel Mormon, MD Date: 07/16/2022 Duration: 22.39 mins Patient history: 71yo F with seizure in setting of hypoglycemia. EEG to evaluate for seizure Level of alertness: Awake AEDs during EEG study: None Technical aspects: This EEG study was done with scalp electrodes positioned according to the 10-20 International system of electrode placement. Electrical activity was reviewed with band pass filter of 1-70Hz , sensitivity of 7 uV/mm, display speed of 44mm/sec with a 60Hz  notched filter applied as appropriate. EEG data were recorded continuously and digitally stored.  Video monitoring was available and reviewed as appropriate. Description: EEG showed continuous generalized 3 to 6  Hz theta-delta slowing. Generalized periodic discharges with triphasic morphology at  1Hz  were also noted, more prominent when awake/stimulated. Hyperventilation and photic stimulation were not performed.   ABNORMALITY - Periodic discharges with triphasic morphology, generalized ( GPDs) - Continuous slow, generalized IMPRESSION: This study showed generalized periodic discharges with triphasic morphology which can be on the ictal-interictal continuum. However, the frequency, morphology and reactivity to stimulation is more commonly indicative of toxic-metabolic causes. Additionally there is moderate diffuse encephalopathy, nonspecific etiology. No seizures  were seen throughout the recording. Lora Havens   DG Chest Port 1 View  Result Date: 07/15/2022 CLINICAL DATA:  Questionable sepsis. EXAM: PORTABLE CHEST 1 VIEW COMPARISON:  Chest radiograph dated 12/10/2021 and CT dated 01/13/2018. FINDINGS: There is mild cardiomegaly with mild vascular congestion. No focal consolidation, pleural effusion, or pneumothorax. No acute osseous pathology. Degenerative changes of the spine. IMPRESSION: Mild cardiomegaly with mild vascular congestion. No focal consolidation. Electronically Signed   By: Anner Crete M.D.   On: 07/15/2022 23:55   CT Head Wo Contrast  Result Date: 07/15/2022 CLINICAL DATA:  Mental status change EXAM: CT HEAD WITHOUT CONTRAST TECHNIQUE: Contiguous axial images were obtained from the base of the skull through the vertex without intravenous contrast. RADIATION DOSE REDUCTION: This exam was performed according to the departmental dose-optimization program which includes automated exposure control, adjustment of the mA and/or kV according to patient size and/or use of iterative reconstruction technique. COMPARISON:  CT brain 12/10/2021, MRI 12/11/2021 FINDINGS: Brain: No acute territorial infarction, hemorrhage or intracranial mass. Chronic right frontal lobe infarct. Atrophy and chronic  small vessel ischemic changes of the white matter. Stable ventricle size. Vascular: No hyperdense vessels.  Carotid vascular calcification Skull: Normal. Negative for fracture or focal lesion. Sinuses/Orbits: No acute finding. Other: None IMPRESSION: 1. No CT evidence for acute intracranial abnormality. 2. Atrophy and chronic small vessel ischemic changes of the white matter. Chronic right frontal infarction Electronically Signed   By: Donavan Foil M.D.   On: 07/15/2022 23:38    Microbiology: Results for orders placed or performed during the hospital encounter of 07/15/22  Urine Culture     Status: Abnormal   Collection Time: 07/15/22 11:53 PM   Specimen: In/Out Cath Urine  Result Value Ref Range Status   Specimen Description   Final    IN/OUT CATH URINE Performed at [facility], [address] [place], [place] [address]    Special Requests   Final    NONE Performed at [facility], [address] [place], [place] [address]    Culture (A)  Final    >=100,000 COLONIES/mL ACTINOMYCES SPECIES Standardized susceptibility testing for this organism is not available. Performed at [facility], [place]  [address], [place], [place] [address]    Report Status 07/18/2022 FINAL  Final  Culture, blood (x 2)     Status: None   Collection Time: 07/16/22  4:04 AM   Specimen: BLOOD  Result Value Ref Range Status   Specimen Description BLOOD LEFT ANTECUBITAL  Final   Special Requests   Final    BOTTLES DRAWN AEROBIC AND ANAEROBIC Blood Culture adequate volume   Culture   Final    NO GROWTH 5 DAYS Performed at [facility], [address] [place], [place] [address]    Report Status 07/21/2022 FINAL  Final  Culture, blood (x 2)     Status: None   Collection Time: 07/16/22  4:04 AM   Specimen: BLOOD  Result Value Ref Range Status   Specimen Description BLOOD BLOOD RIGHT FOREARM  Final   Special Requests   Final    BOTTLES DRAWN AEROBIC AND ANAEROBIC Blood  Culture results may not be optimal due to an excessive volume of blood received in culture bottles   Culture   Final    NO GROWTH 5 DAYS Performed at [facility], [address] [place], [place] [address]    Report Status 07/21/2022 FINAL  Final  Urine Culture     Status: Abnormal   Collection Time: 07/20/22  1:30 AM   Specimen: Urine, Clean Catch  Result Value Ref Range Status   Specimen Description   Final    URINE, CLEAN CATCH Performed at [facility], [address] [place], [place] [address]    Special Requests   Final    NONE Performed at [facility], [address] [place], [place] [address]    Culture >=100,000 COLONIES/mL YEAST (A)  Final   Report Status 07/21/2022 FINAL  Final    Labs: CBC: Recent Labs  Lab 07/15/22 2229 07/16/22 0403 07/17/22 0958 07/18/22 0745  WBC 5.6 4.7 5.1 6.2  NEUTROABS 4.5  --  3.1 3.7  HGB 11.9* 10.1* 10.8* 10.4*  HCT 38.0 32.4* 34.1* 32.3*  MCV 89.8 90.0 88.8 87.5  PLT 175 154 150 159   Basic Metabolic Panel: Recent Labs  Lab 07/15/22 2229 07/16/22 0403 07/17/22 0958 07/18/22 0745  NA 139 137 139 141  K 5.1 3.9 3.9 3.6  CL 104 103 110 113*  CO2 27 28 24 23   GLUCOSE 48* 226* 78 106*  BUN 33* 32* 22 16  CREATININE 1.28* 1.15* 1.09* 1.26*  CALCIUM 8.9 8.6* 8.8* 8.9   Liver Function Tests: No results for input(s): "AST", "ALT", "ALKPHOS", "BILITOT", "PROT", "ALBUMIN" in the last 168 hours. CBG: Recent Labs  Lab 07/20/22 1945 07/21/22 0022 07/21/22 0350 07/21/22 0801 07/21/22 1213  GLUCAP 154* 154* 90 81 232*    Discharge time spent: greater than 30 minutes.  Signed: 07/23/22, MD [facility] 07/21/2022

## 2022-07-21 NOTE — TOC Progression Note (Signed)
Transition of Care [facility]) - Progression Note    Patient Details  Name: Leah Ritter MRN: [id number] Date of Birth: October 29, 1950  Transition of Care [facility]) CM/SW [place], [place] Phone Number: 07/21/2022, 9:55 AM  Clinical Narrative:      SW spoke to [facility] [phone] and she is completing the assisted living evaluation with the patient this morning. She explained that she will need the home hospice order prior to discharge. She has been in touch with Melissa Been from [facility]. She states patient's daughter can assist with transport.      Expected Discharge Plan and [facility] and [facility]                                             Social Determinants of Health (SDOH) Interventions    Readmission Risk Interventions     No data to display

## 2023-01-21 ENCOUNTER — Other Ambulatory Visit: Payer: Self-pay

## 2023-01-21 ENCOUNTER — Emergency Department: Payer: Medicare Other

## 2023-01-21 ENCOUNTER — Inpatient Hospital Stay
Admission: EM | Admit: 2023-01-21 | Discharge: 2023-02-03 | DRG: 637 | Disposition: A | Discharge status: Skilled Nursing Facility | Payer: Medicare Other | Source: Skilled Nursing Facility | Attending: Student | Admitting: Student

## 2023-01-21 DIAGNOSIS — I5032 Chronic diastolic (congestive) heart failure: Secondary | ICD-10-CM | POA: Diagnosis present

## 2023-01-21 DIAGNOSIS — Z1152 Encounter for screening for COVID-19: Secondary | ICD-10-CM | POA: Diagnosis not present

## 2023-01-21 DIAGNOSIS — R4182 Altered mental status, unspecified: Secondary | ICD-10-CM | POA: Diagnosis not present

## 2023-01-21 DIAGNOSIS — N179 Acute kidney failure, unspecified: Secondary | ICD-10-CM | POA: Diagnosis present

## 2023-01-21 DIAGNOSIS — I130 Hypertensive heart and chronic kidney disease with heart failure and stage 1 through stage 4 chronic kidney disease, or unspecified chronic kidney disease: Secondary | ICD-10-CM | POA: Diagnosis present

## 2023-01-21 DIAGNOSIS — Z66 Do not resuscitate: Secondary | ICD-10-CM | POA: Diagnosis present

## 2023-01-21 DIAGNOSIS — Z515 Encounter for palliative care: Secondary | ICD-10-CM | POA: Diagnosis not present

## 2023-01-21 DIAGNOSIS — E1100 Type 2 diabetes mellitus with hyperosmolarity without nonketotic hyperglycemic-hyperosmolar coma (NKHHC): Secondary | ICD-10-CM | POA: Diagnosis present

## 2023-01-21 DIAGNOSIS — R739 Hyperglycemia, unspecified: Secondary | ICD-10-CM | POA: Diagnosis not present

## 2023-01-21 DIAGNOSIS — I480 Paroxysmal atrial fibrillation: Secondary | ICD-10-CM | POA: Diagnosis present

## 2023-01-21 DIAGNOSIS — W19XXXA Unspecified fall, initial encounter: Secondary | ICD-10-CM | POA: Diagnosis not present

## 2023-01-21 DIAGNOSIS — Z8673 Personal history of transient ischemic attack (TIA), and cerebral infarction without residual deficits: Secondary | ICD-10-CM

## 2023-01-21 DIAGNOSIS — Z8249 Family history of ischemic heart disease and other diseases of the circulatory system: Secondary | ICD-10-CM

## 2023-01-21 DIAGNOSIS — J189 Pneumonia, unspecified organism: Secondary | ICD-10-CM | POA: Diagnosis present

## 2023-01-21 DIAGNOSIS — G4733 Obstructive sleep apnea (adult) (pediatric): Secondary | ICD-10-CM | POA: Diagnosis present

## 2023-01-21 DIAGNOSIS — E871 Hypo-osmolality and hyponatremia: Secondary | ICD-10-CM | POA: Diagnosis present

## 2023-01-21 DIAGNOSIS — Z841 Family history of disorders of kidney and ureter: Secondary | ICD-10-CM

## 2023-01-21 DIAGNOSIS — E785 Hyperlipidemia, unspecified: Secondary | ICD-10-CM | POA: Diagnosis present

## 2023-01-21 DIAGNOSIS — Z885 Allergy status to narcotic agent status: Secondary | ICD-10-CM

## 2023-01-21 DIAGNOSIS — E119 Type 2 diabetes mellitus without complications: Secondary | ICD-10-CM | POA: Diagnosis not present

## 2023-01-21 DIAGNOSIS — E870 Hyperosmolality and hypernatremia: Secondary | ICD-10-CM | POA: Diagnosis not present

## 2023-01-21 DIAGNOSIS — T68XXXA Hypothermia, initial encounter: Secondary | ICD-10-CM | POA: Diagnosis not present

## 2023-01-21 DIAGNOSIS — G9341 Metabolic encephalopathy: Secondary | ICD-10-CM | POA: Diagnosis present

## 2023-01-21 DIAGNOSIS — E1122 Type 2 diabetes mellitus with diabetic chronic kidney disease: Secondary | ICD-10-CM | POA: Diagnosis present

## 2023-01-21 DIAGNOSIS — E876 Hypokalemia: Secondary | ICD-10-CM | POA: Diagnosis present

## 2023-01-21 DIAGNOSIS — Z6827 Body mass index (BMI) 27.0-27.9, adult: Secondary | ICD-10-CM

## 2023-01-21 DIAGNOSIS — Z7189 Other specified counseling: Secondary | ICD-10-CM | POA: Diagnosis not present

## 2023-01-21 DIAGNOSIS — Z79891 Long term (current) use of opiate analgesic: Secondary | ICD-10-CM

## 2023-01-21 DIAGNOSIS — F419 Anxiety disorder, unspecified: Secondary | ICD-10-CM | POA: Diagnosis present

## 2023-01-21 DIAGNOSIS — N1831 Chronic kidney disease, stage 3a: Secondary | ICD-10-CM | POA: Diagnosis present

## 2023-01-21 DIAGNOSIS — G40909 Epilepsy, unspecified, not intractable, without status epilepticus: Secondary | ICD-10-CM | POA: Diagnosis present

## 2023-01-21 DIAGNOSIS — E669 Obesity, unspecified: Secondary | ICD-10-CM | POA: Diagnosis present

## 2023-01-21 DIAGNOSIS — N390 Urinary tract infection, site not specified: Secondary | ICD-10-CM | POA: Diagnosis present

## 2023-01-21 DIAGNOSIS — K219 Gastro-esophageal reflux disease without esophagitis: Secondary | ICD-10-CM | POA: Diagnosis present

## 2023-01-21 DIAGNOSIS — Z888 Allergy status to other drugs, medicaments and biological substances status: Secondary | ICD-10-CM

## 2023-01-21 DIAGNOSIS — R680 Hypothermia, not associated with low environmental temperature: Secondary | ICD-10-CM | POA: Diagnosis present

## 2023-01-21 DIAGNOSIS — R339 Retention of urine, unspecified: Secondary | ICD-10-CM | POA: Diagnosis not present

## 2023-01-21 DIAGNOSIS — Z90711 Acquired absence of uterus with remaining cervical stump: Secondary | ICD-10-CM

## 2023-01-21 DIAGNOSIS — G9340 Encephalopathy, unspecified: Secondary | ICD-10-CM

## 2023-01-21 DIAGNOSIS — F05 Delirium due to known physiological condition: Secondary | ICD-10-CM | POA: Diagnosis not present

## 2023-01-21 DIAGNOSIS — I700 Atherosclerosis of aorta: Secondary | ICD-10-CM | POA: Diagnosis present

## 2023-01-21 DIAGNOSIS — E11649 Type 2 diabetes mellitus with hypoglycemia without coma: Secondary | ICD-10-CM | POA: Diagnosis not present

## 2023-01-21 DIAGNOSIS — F0390 Unspecified dementia without behavioral disturbance: Secondary | ICD-10-CM | POA: Diagnosis present

## 2023-01-21 DIAGNOSIS — Z79899 Other long term (current) drug therapy: Secondary | ICD-10-CM

## 2023-01-21 DIAGNOSIS — Z823 Family history of stroke: Secondary | ICD-10-CM

## 2023-01-21 DIAGNOSIS — I509 Heart failure, unspecified: Secondary | ICD-10-CM

## 2023-01-21 DIAGNOSIS — T83091A Other mechanical complication of indwelling urethral catheter, initial encounter: Secondary | ICD-10-CM | POA: Diagnosis present

## 2023-01-21 LAB — BLOOD GAS, VENOUS
Acid-Base Excess: 2.6 mmol/L — ABNORMAL HIGH (ref 0.0–2.0)
Bicarbonate: 29.7 mmol/L — ABNORMAL HIGH (ref 20.0–28.0)
O2 Saturation: 60.1 %
Patient temperature: 37
pCO2, Ven: 55 mmHg (ref 44–60)
pH, Ven: 7.34 (ref 7.25–7.43)
pO2, Ven: 40 mmHg (ref 32–45)

## 2023-01-21 LAB — SODIUM: Sodium: 162 mmol/L (ref 135–145)

## 2023-01-21 LAB — BASIC METABOLIC PANEL
Anion gap: 8 (ref 5–15)
Anion gap: 5 (ref 5–15)
BUN: 77 mg/dL — ABNORMAL HIGH (ref 8–23)
BUN: 67 mg/dL — ABNORMAL HIGH (ref 8–23)
CO2: 25 mmol/L (ref 22–32)
CO2: 29 mmol/L (ref 22–32)
Calcium: 8.1 mg/dL — ABNORMAL LOW (ref 8.9–10.3)
Calcium: 8.5 mg/dL — ABNORMAL LOW (ref 8.9–10.3)
Chloride: 126 mmol/L — ABNORMAL HIGH (ref 98–111)
Chloride: 129 mmol/L — ABNORMAL HIGH (ref 98–111)
Creatinine, Ser: 1.82 mg/dL — ABNORMAL HIGH (ref 0.44–1.00)
Creatinine, Ser: 1.54 mg/dL — ABNORMAL HIGH (ref 0.44–1.00)
GFR, Estimated: 29 mL/min — ABNORMAL LOW (ref >60)
GFR, Estimated: 36 mL/min — ABNORMAL LOW (ref >60)
Glucose, Bld: 480 mg/dL — ABNORMAL HIGH (ref 70–99)
Glucose, Bld: 120 mg/dL — ABNORMAL HIGH (ref 70–99)
Potassium: 4.1 mmol/L (ref 3.5–5.1)
Potassium: 3.2 mmol/L — ABNORMAL LOW (ref 3.5–5.1)
Sodium: 159 mmol/L — ABNORMAL HIGH (ref 135–145)
Sodium: 163 mmol/L (ref 135–145)

## 2023-01-21 LAB — CBG MONITORING, ED
Glucose-Capillary: 429 mg/dL — ABNORMAL HIGH (ref 70–99)
Glucose-Capillary: 328 mg/dL — ABNORMAL HIGH (ref 70–99)

## 2023-01-21 LAB — CBC WITH DIFFERENTIAL/PLATELET
Abs Immature Granulocytes: 0.02 10*3/uL (ref 0.00–0.07)
Basophils Absolute: 0.1 10*3/uL (ref 0.0–0.1)
Basophils Relative: 1 %
Eosinophils Absolute: 0.1 10*3/uL (ref 0.0–0.5)
Eosinophils Relative: 1 %
HCT: 41.6 % (ref 36.0–46.0)
Hemoglobin: 12.9 g/dL (ref 12.0–15.0)
Immature Granulocytes: 0 %
Lymphocytes Relative: 16 %
Lymphs Abs: 1.6 10*3/uL (ref 0.7–4.0)
MCH: 29 pg (ref 26.0–34.0)
MCHC: 31 g/dL (ref 30.0–36.0)
MCV: 93.5 fL (ref 80.0–100.0)
Monocytes Absolute: 0.6 10*3/uL (ref 0.1–1.0)
Monocytes Relative: 6 %
Neutro Abs: 7.2 10*3/uL (ref 1.7–7.7)
Neutrophils Relative %: 76 %
Platelets: 157 10*3/uL (ref 150–400)
RBC: 4.45 MIL/uL (ref 3.87–5.11)
RDW: 14.1 % (ref 11.5–15.5)
WBC: 9.6 10*3/uL (ref 4.0–10.5)
nRBC: 0 % (ref 0.0–0.2)

## 2023-01-21 LAB — COMPREHENSIVE METABOLIC PANEL
ALT: 52 U/L — ABNORMAL HIGH (ref 0–44)
AST: 23 U/L (ref 15–41)
Albumin: 3.4 g/dL — ABNORMAL LOW (ref 3.5–5.0)
Alkaline Phosphatase: 122 U/L (ref 38–126)
Anion gap: 11 (ref 5–15)
BUN: 89 mg/dL — ABNORMAL HIGH (ref 8–23)
CO2: 29 mmol/L (ref 22–32)
Calcium: 9.5 mg/dL (ref 8.9–10.3)
Chloride: 119 mmol/L — ABNORMAL HIGH (ref 98–111)
Creatinine, Ser: 2.23 mg/dL — ABNORMAL HIGH (ref 0.44–1.00)
GFR, Estimated: 23 mL/min — ABNORMAL LOW (ref >60)
Glucose, Bld: 622 mg/dL (ref 70–99)
Potassium: 4.6 mmol/L (ref 3.5–5.1)
Sodium: 159 mmol/L — ABNORMAL HIGH (ref 135–145)
Total Bilirubin: 0.8 mg/dL (ref 0.3–1.2)
Total Protein: 7.5 g/dL (ref 6.5–8.1)

## 2023-01-21 LAB — GLUCOSE, CAPILLARY
Glucose-Capillary: 180 mg/dL — ABNORMAL HIGH (ref 70–99)
Glucose-Capillary: 138 mg/dL — ABNORMAL HIGH (ref 70–99)
Glucose-Capillary: 130 mg/dL — ABNORMAL HIGH (ref 70–99)
Glucose-Capillary: 105 mg/dL — ABNORMAL HIGH (ref 70–99)
Glucose-Capillary: 83 mg/dL (ref 70–99)

## 2023-01-21 LAB — URINALYSIS, W/ REFLEX TO CULTURE (INFECTION SUSPECTED)
Bilirubin Urine: NEGATIVE
Glucose, UA: >=500 mg/dL — AB
Hgb urine dipstick: NEGATIVE
Ketones, ur: NEGATIVE mg/dL
Leukocytes,Ua: NEGATIVE
Nitrite: NEGATIVE
Protein, ur: NEGATIVE mg/dL
Specific Gravity, Urine: 1.027 (ref 1.005–1.030)
pH: 5 (ref 5.0–8.0)

## 2023-01-21 LAB — PROCALCITONIN: Procalcitonin: 0.1 ng/mL

## 2023-01-21 LAB — BETA-HYDROXYBUTYRIC ACID: Beta-Hydroxybutyric Acid: 0.42 mmol/L — ABNORMAL HIGH (ref 0.05–0.27)

## 2023-01-21 LAB — LACTIC ACID, PLASMA
Lactic Acid, Venous: 1.9 mmol/L (ref 0.5–1.9)
Lactic Acid, Venous: 3.1 mmol/L (ref 0.5–1.9)

## 2023-01-21 LAB — OSMOLALITY, URINE: Osmolality, Ur: 682 mOsm/kg (ref 300–900)

## 2023-01-21 LAB — PROTIME-INR
INR: 1.3 — ABNORMAL HIGH (ref 0.8–1.2)
Prothrombin Time: 16.2 seconds — ABNORMAL HIGH (ref 11.4–15.2)

## 2023-01-21 LAB — RESP PANEL BY RT-PCR (RSV, FLU A&B, COVID)  RVPGX2
Influenza A by PCR: NEGATIVE
Influenza B by PCR: NEGATIVE
Resp Syncytial Virus by PCR: NEGATIVE
SARS Coronavirus 2 by RT PCR: NEGATIVE

## 2023-01-21 LAB — MRSA NEXT GEN BY PCR, NASAL: MRSA by PCR Next Gen: NOT DETECTED

## 2023-01-21 LAB — APTT: aPTT: 25 seconds (ref 24–36)

## 2023-01-21 MED ORDER — DEXTROSE 50 % IV SOLN: 0.0000 mL | INTRAVENOUS | Status: DC | PRN

## 2023-01-21 MED ORDER — LACTATED RINGERS IV BOLUS
2000.0000 mL | Freq: Once | INTRAVENOUS | Status: AC
  Administered 2023-01-21: 2000 mL via INTRAVENOUS

## 2023-01-21 MED ORDER — SODIUM CHLORIDE 0.9 % IV BOLUS (SEPSIS)
1000.0000 mL | Freq: Once | INTRAVENOUS | Status: AC
  Administered 2023-01-21: 1000 mL via INTRAVENOUS

## 2023-01-21 MED ORDER — INSULIN GLARGINE-YFGN 100 UNIT/ML ~~LOC~~ SOLN
10.0000 [IU] | Freq: Every day | SUBCUTANEOUS | Status: DC
  Administered 2023-01-21 – 2023-01-23 (×3): 10 [IU] via SUBCUTANEOUS
  Filled 2023-01-21 (×3): qty 0.1

## 2023-01-21 MED ORDER — POTASSIUM CHLORIDE 10 MEQ/100ML IV SOLN: 10.0000 meq | INTRAVENOUS | Status: AC

## 2023-01-21 MED ORDER — INSULIN REGULAR(HUMAN) IN NACL 100-0.9 UT/100ML-% IV SOLN
INTRAVENOUS | Status: DC
  Administered 2023-01-21: 11.5 [IU]/h via INTRAVENOUS
  Filled 2023-01-21: qty 100

## 2023-01-21 MED ORDER — DEXTROSE IN LACTATED RINGERS 5 % IV SOLN: INTRAVENOUS | Status: DC

## 2023-01-21 MED ORDER — SODIUM CHLORIDE 0.9 % IV SOLN
2.0000 g | INTRAVENOUS | Status: AC
  Administered 2023-01-21 – 2023-01-25 (×5): 2 g via INTRAVENOUS
  Filled 2023-01-21: qty 2
  Filled 2023-01-21: qty 20
  Filled 2023-01-21 (×2): qty 2
  Filled 2023-01-21: qty 20

## 2023-01-21 MED ORDER — KCL IN DEXTROSE-NACL 20-5-0.45 MEQ/L-%-% IV SOLN
INTRAVENOUS | Status: DC
  Filled 2023-01-21 (×2): qty 1000

## 2023-01-21 MED ORDER — LACTATED RINGERS IV SOLN: INTRAVENOUS | Status: DC

## 2023-01-21 MED ORDER — HEPARIN SODIUM (PORCINE) 5000 UNIT/ML IJ SOLN
5000.0000 [IU] | Freq: Three times a day (TID) | INTRAMUSCULAR | Status: DC
  Administered 2023-01-21 – 2023-02-01 (×31): 5000 [IU] via SUBCUTANEOUS
  Filled 2023-01-21 (×31): qty 1

## 2023-01-21 MED ORDER — POTASSIUM CL IN DEXTROSE 5% 20 MEQ/L IV SOLN
20.0000 meq | INTRAVENOUS | Status: DC
  Filled 2023-01-21: qty 1000

## 2023-01-21 MED ORDER — SODIUM CHLORIDE 0.9 % IV SOLN: INTRAVENOUS | Status: DC

## 2023-01-21 MED ORDER — INSULIN ASPART 100 UNIT/ML IJ SOLN
0.0000 [IU] | INTRAMUSCULAR | Status: DC
  Administered 2023-01-22 (×2): 5 [IU] via SUBCUTANEOUS
  Administered 2023-01-22: 3 [IU] via SUBCUTANEOUS
  Administered 2023-01-22 (×2): 2 [IU] via SUBCUTANEOUS
  Administered 2023-01-22: 5 [IU] via SUBCUTANEOUS
  Administered 2023-01-23: 2 [IU] via SUBCUTANEOUS
  Administered 2023-01-23 (×3): 5 [IU] via SUBCUTANEOUS
  Administered 2023-01-23: 3 [IU] via SUBCUTANEOUS
  Administered 2023-01-24: 15 [IU] via SUBCUTANEOUS
  Administered 2023-01-24: 11 [IU] via SUBCUTANEOUS
  Filled 2023-01-21 (×14): qty 1

## 2023-01-21 MED ORDER — SODIUM CHLORIDE 0.9 % IV SOLN
500.0000 mg | INTRAVENOUS | Status: AC
  Administered 2023-01-21 – 2023-01-25 (×5): 500 mg via INTRAVENOUS
  Filled 2023-01-21: qty 500
  Filled 2023-01-21: qty 5
  Filled 2023-01-21: qty 500
  Filled 2023-01-21 (×2): qty 5

## 2023-01-21 MED ORDER — CHLORHEXIDINE GLUCONATE CLOTH 2 % EX PADS
6.0000 | MEDICATED_PAD | Freq: Every day | CUTANEOUS | Status: DC
  Administered 2023-01-22 – 2023-01-23 (×2): 6 via TOPICAL

## 2023-01-21 NOTE — Progress Notes (Signed)
1826 - Pt arrival from ED and pt connected to monitor. VSS. Pt with incoherent speech, but moving all extremities. Bilateral breath sounds clear and diminished. Neuro exam limited due to pt interaction. 1910- Report given Doreen Salvage, RN.

## 2023-01-21 NOTE — Sepsis Progress Note (Signed)
eLink is following this Code Sepsis. °

## 2023-01-21 NOTE — Inpatient Diabetes Management (Signed)
Inpatient Diabetes Program Recommendations  AACE/ADA: New Consensus Statement on Inpatient Glycemic Control (2015)  Target Ranges:  Prepandial:   less than 140 mg/dL      Peak postprandial:   less than 180 mg/dL (1-2 hours)      Critically ill patients:  140 - 180 mg/dL   Lab Results  Component Value Date   GLUCAP 232 (H) 07/21/2022   HGBA1C 8.4 (H) 07/16/2022    Review of Glycemic Control  Latest Reference Range & Units 01/21/23 09:49  Glucose 70 - 99 mg/dL 161 (HH)  (HH): Data is critically high  Diabetes history: DM2 Outpatient Diabetes medications: jardiance 10 mg QD, Glipizide 10 mg QD  Spoke with patients daughter at bedside.  Ms. Matney is from [facility].  Daughter confirms above DM medications.  She states they check her CBGs occasionally but not regularly.  She states her BG has never been this high.  Please obtain current A1C.  Will continue to follow while inpatient.  Thank you, Dulce Sellar, MSN, CDCES Diabetes Coordinator Inpatient Diabetes Program [phone] (team pager from 8a-5p)

## 2023-01-21 NOTE — Consult Note (Signed)
Initial Consultation Note   Patient: Leah Ritter ZOX:[id number] DOB: [birth year] PCP: [facility] DOA: 01/21/2023 DOS: the patient was seen and examined on 01/21/2023 Primary service: Merwyn Katos, MD  Referring physician: Vicente Males MD  Reason for consult: sepsis, HHS, hypernatremia    Assessment/Plan: Assessment and Plan: Patient presenting with hypothermia in setting of HHS with blood sugars into the 600s, encephalopathy and severe hypernatremia Given encephalopathy with severe hypernatremia with corrected sodium at around 170, will need ICU monitoring of serum sodium overnight in addition to the insulin drip.  Patient noted to be DNR.  However, family wishes to have aggressive measures apart from intubation and CPR directly.  Will defer admission to ICU management in the interim.    TRH will sign off at present, please call us again when needed.  HPI: Leah Ritter is a 72 y.o. female with past medical history of multiple medical issues including afib, CHF, diabetes, history of seizures, hx/o CVA presenting w/ hypothermia, HHS, hypernatremia, encephalopathy. History from daughter in setting of encephalopathy.  Patient noted to be resident of local skilled nursing facility.  Per report, patient with worsening lethargy over past 24 hours.  Blood sugars and have been checked over multiple days.  No reported nausea or vomiting.  No reported fevers or chills.  Positive worsening confusion.  Noted baseline seizures.  No reported active seizure events.  Positive decreased p.o. intake.  Positive similar symptoms with hyperglycemia in the past. Presented to the ER temperature 96, BP stable.  Satting well on room air.  White count 9.6, hemoglobin 12.9, platelets 157, blood sugar 622, sodium 159, creatinine 2.23, VBG stable.  COVID flu and RSV negative.  Lactate 1.9, urinalysis with greater than 500 glucose ketones negative no leukocytes or nitrites.  Many bacteria.  Beta-hydroxybutyrate  0.42.  CT head, chest x-ray and CT chest abdomen pelvis grossly stable. Review of Systems: As mentioned in the history of present illness. All other systems reviewed and are negative. Past Medical History:  Diagnosis Date   A-fib (HCC)    Anxiety    CHF (congestive heart failure) (HCC)    Diabetes (HCC)    GERD (gastroesophageal reflux disease)    Hypertension    Sleep apnea    Stroke [facility]    Past Surgical History:  Procedure Laterality Date   PARTIAL HYSTERECTOMY     tubligation     Social History:  reports that she has never smoked. She has never used smokeless tobacco. She reports that she does not currently use alcohol. She reports that she does not use drugs.  Allergies  Allergen Reactions   Oxycodone-Acetaminophen Nausea And Vomiting    Other reaction(s): Vomiting   Ezetimibe Other (See Comments)    Reaction unknown   Lisinopril Other (See Comments)    Reaction unknown Other reaction(s): Unknown   Statins Other (See Comments)    Reaction unknown     Family History  Problem Relation Age of Onset   Breast cancer Mother    Stroke Father    Hypertension Father    Kidney disease Brother    Cancer Brother     Prior to Admission medications   Medication Sig Start Date End Date Taking? Authorizing Provider  amLODipine (NORVASC) 5 MG tablet Take 1 tablet (5 mg total) by mouth daily. 07/15/21   Pokhrel, Rebekah Chesterfield, MD  atorvastatin (LIPITOR) 80 MG tablet Take 80 mg by mouth daily at 6 PM.    [provider]  bisacodyl (DULCOLAX) 10 MG suppository  Place 10 mg rectally as needed for moderate constipation.    [provider]  CONTOUR NEXT TEST test strip 1 each by Other route every evening. 11/14/17   [provider]  feeding supplement (ENSURE ENLIVE / ENSURE PLUS) LIQD Take 237 mLs by mouth 3 (three) times daily between meals. 07/14/21   Pokhrel, Rebekah Chesterfield, MD  furosemide (LASIX) 20 MG tablet Take 30 mg by mouth daily. 12/09/21   [provider]   ibuprofen (ADVIL) 600 MG tablet Take 600 mg by mouth every 6 (six) hours as needed.    [provider]  ketoconazole (NIZORAL) 2 % shampoo Apply 1 Application topically 2 (two) times a week.    [provider]  LORazepam (ATIVAN) 0.5 MG tablet Take 0.5 mg by mouth every 4 (four) hours as needed for seizure.    [provider]  mirtazapine (REMERON SOL-TAB) 15 MG disintegrating tablet Take 1 tablet (15 mg total) by mouth at bedtime. 05/08/21   Arnetha Courser, MD  Morphine Sulfate (MORPHINE CONCENTRATE) 10 mg / 0.5 ml concentrated solution Take 20 mg by mouth every hour as needed for severe pain or shortness of breath.    [provider]  NON FORMULARY 2 (two) times daily as needed.    [provider]  oxybutynin (DITROPAN) 5 MG tablet Take 5 mg by mouth daily.    [provider]  QUEtiapine (SEROQUEL) 50 MG tablet Take 25 mg by mouth at bedtime. 04/29/21   [provider]  traMADol (ULTRAM) 50 MG tablet Take 50 mg by mouth every 8 (eight) hours as needed.    [provider]    Physical Exam: Vitals:   01/21/23 1315 01/21/23 1330 01/21/23 1430 01/21/23 1530  BP: 114/62 (!) 119/59 109/60 128/62  Pulse: 75 82 79 79  Resp: (!) 21 (!) 26 17 16   Temp:      TempSrc:      SpO2: 100% 96% 97% 99%   Physical Exam Constitutional:      Appearance: She is obese.     Comments: Lethargic at the bedside  HENT:     Nose: Nose normal.     Mouth/Throat:     Mouth: Mucous membranes are moist.  Eyes:     Pupils: Pupils are equal, round, and reactive to light.  Cardiovascular:     Rate and Rhythm: Normal rate and regular rhythm.  Pulmonary:     Effort: Pulmonary effort is normal.  Abdominal:     General: Bowel sounds are normal.  Musculoskeletal:     Comments: Positive generalized decreased range of motion in the setting lethargy  Neurological:     Comments: Positive generalized lethargy Otherwise nonfocal neuroexam  Psychiatric:         Mood and Affect: Mood normal.     Data Reviewed:   There are no new results to review at this time.   CT Head Wo Contrast CLINICAL DATA:  Mental status change, unknown cause  EXAM: CT HEAD WITHOUT CONTRAST  TECHNIQUE: Contiguous axial images were obtained from the base of the skull through the vertex without intravenous contrast.  RADIATION DOSE REDUCTION: This exam was performed according to the departmental dose-optimization program which includes automated exposure control, adjustment of the mA and/or kV according to patient size and/or use of iterative reconstruction technique.  COMPARISON:  CT head October 19, 23.  FINDINGS: Brain: Similar appearance of a remote right frontal infarct with surrounding gliosis. No evidence of acute large vascular territory infarct,  acute hemorrhage, mass lesion or midline shift. No hydrocephalus.  Vascular: No hyperdense vessel identified.  Skull: No acute fracture.  Sinuses/Orbits: Clear sinuses.  No acute orbital findings.  Other: No mastoid effusions.  IMPRESSION: 1. No evidence of acute intracranial abnormality. 2. Chronic right frontal infarct.  Electronically Signed   By: Feliberto Harts M.D.   On: 01/21/2023 14:11 CT CHEST ABDOMEN PELVIS WO CONTRAST CLINICAL DATA:  Sepsis.  EXAM: CT CHEST, ABDOMEN AND PELVIS WITHOUT CONTRAST  TECHNIQUE: Multidetector CT imaging of the chest, abdomen and pelvis was performed following the standard protocol without IV contrast.  RADIATION DOSE REDUCTION: This exam was performed according to the departmental dose-optimization program which includes automated exposure control, adjustment of the mA and/or kV according to patient size and/or use of iterative reconstruction technique.  COMPARISON:  July 06, 2021.  January 13, 2018.  FINDINGS: CT CHEST FINDINGS  Cardiovascular: Mild cardiomegaly. No evidence of thoracic aortic aneurysm. No pericardial effusion. Mild  coronary artery calcifications are noted.  Mediastinum/Nodes: No enlarged mediastinal, hilar, or axillary lymph nodes. Thyroid gland, trachea, and esophagus demonstrate no significant findings.  Lungs/Pleura: No pneumothorax or pleural effusion is noted. Stable 7 mm nodule is noted in right lower lobe best seen on image number 57 of series 4. Stable 3 mm nodule is noted in right upper lobe compared to prior exam. These can be considered benign at this point with no further follow-up required. No acute pulmonary abnormality is noted.  Musculoskeletal: No chest wall mass or suspicious bone lesions identified.  CT ABDOMEN PELVIS FINDINGS  Hepatobiliary: Cholelithiasis. No biliary dilatation. Liver is unremarkable.  Pancreas: Unremarkable. No pancreatic ductal dilatation or surrounding inflammatory changes.  Spleen: Normal in size without focal abnormality.  Adrenals/Urinary Tract: Adrenal glands appear normal. Stable right renal cyst is noted for which no further follow-up is required. No hydronephrosis or renal obstruction is noted. Urinary bladder is unremarkable.  Stomach/Bowel: Stomach is within normal limits. Appendix appears normal. No evidence of bowel wall thickening, distention, or inflammatory changes.  Vascular/Lymphatic: Aortic atherosclerosis. No enlarged abdominal or pelvic lymph nodes.  Reproductive: Status post hysterectomy. No adnexal masses.  Other: No abdominal wall hernia or abnormality. No abdominopelvic ascites.  Musculoskeletal: No acute or significant osseous findings.  IMPRESSION: No acute abnormality seen in the chest, abdomen or pelvis.  Cholelithiasis.  Mild coronary artery calcifications are noted.  Aortic Atherosclerosis (ICD10-I70.0).  Electronically Signed   By: Lupita Raider M.D.   On: 01/21/2023 12:57 DG Chest Port 1 View CLINICAL DATA:  Concern for sepsis.  EXAM: PORTABLE CHEST 1 VIEW  COMPARISON:  Chest x-ray dated  July 15, 2022  FINDINGS: Cardiac and mediastinal contours are unchanged. Low lung volumes with hypoventilatory changes. No focal consolidation. No evidence of pleural effusion or pneumothorax.  IMPRESSION: Low lung volumes with hypoventilatory changes. No focal consolidation.  Electronically Signed   By: Allegra Lai M.D.   On: 01/21/2023 10:30  Lab Results  Component Value Date   WBC 9.6 01/21/2023   HGB 12.9 01/21/2023   HCT 41.6 01/21/2023   MCV 93.5 01/21/2023   PLT 157 01/21/2023   Last metabolic panel Lab Results  Component Value Date   GLUCOSE 480 (H) 01/21/2023   NA 159 (H) 01/21/2023   K 4.1 01/21/2023   CL 126 (H) 01/21/2023   CO2 25 01/21/2023   BUN 77 (H) 01/21/2023   CREATININE 1.82 (H) 01/21/2023   GFRNONAA 29 (L) 01/21/2023   CALCIUM 8.1 (L) 01/21/2023  PHOS 2.9 07/11/2021   PROT 7.5 01/21/2023   ALBUMIN 3.4 (L) 01/21/2023   BILITOT 0.8 01/21/2023   ALKPHOS 122 01/21/2023   AST 23 01/21/2023   ALT 52 (H) 01/21/2023   ANIONGAP 8 01/21/2023    Family Communication: Daughter at the bedside  Primary team communication: Dr. Sidney Ace made aware  Thank you very much for involving [place] in the care of your patient.  Author: Floydene Flock, MD 01/21/2023 3:47 PM  For on call review [email or web address].

## 2023-01-21 NOTE — ED Triage Notes (Signed)
Patient from [facility], facility reports poor po intake for 3-4 days and today lethargic.

## 2023-01-21 NOTE — Consult Note (Signed)
CODE SEPSIS - PHARMACY COMMUNICATION  **Broad Spectrum Antibiotics should be administered within 1 hour of Sepsis diagnosis**  Time Code Sepsis Called/Page Received: 1610  Antibiotics Ordered: ceftriaxone, azithromycin  Time of 1st antibiotic administration: 1027  Additional action taken by pharmacy: none  If necessary, Name of Provider/Nurse Contacted: n/a    Bettey Costa ,PharmD Clinical Pharmacist  01/21/2023  9:54 AM

## 2023-01-21 NOTE — Consult Note (Signed)
NAME:  Leah Ritter, MRN:  [id number], DOB:  08-25-1951, LOS: 0 ADMISSION DATE:  01/21/2023, CONSULTATION DATE:  21 Jan 2023 REFERRING MD:  Alvester Morin, CHIEF COMPLAINT:  AMS  Brief Pt Description / Synopsis:   AMS  History of Present Illness:  Per hospitalist HPI: Leah Ritter is a 72 y.o. female with past medical history of multiple medical issues including afib, CHF, diabetes, history of seizures, hx/o CVA presenting w/ hypothermia, HHS, hypernatremia, encephalopathy. History from daughter in setting of encephalopathy.  Patient noted to be resident of local skilled nursing facility.  Per report, patient with worsening lethargy over past 24 hours.  Blood sugars and have been checked over multiple days.  No reported nausea or vomiting.  No reported fevers or chills.  Positive worsening confusion.  Noted baseline seizures.  No reported active seizure events.  Positive decreased p.o. intake.  Positive similar symptoms with hyperglycemia in the past. Presented to the ER temperature 96, BP stable.  Satting well on room air.  White count 9.6, hemoglobin 12.9, platelets 157, blood sugar 622, sodium 159, creatinine 2.23, VBG stable.  COVID flu and RSV negative.  Lactate 1.9, urinalysis with greater than 500 glucose ketones negative no leukocytes or nitrites.  Many bacteria.  Beta-hydroxybutyrate 0.42.  CT head, chest x-ray and CT chest abdomen pelvis grossly stable. Review of Systems: As mentioned in the history of present illness. All other systems reviewed  ED course: patient received 5L of fluids (2L LR and 3L NS). Started on insulin gtt. Much of history was obtained from daughter. Patient suffers from DM2 and dementia. Daughter noticed that on Tuesday, patient was not acting herself and unable to eat/drink. By today, patient was acting very confused. She was noted to have significant hyperglycemia and brought to ER.   PMH: HTN  HLD DM2 Dementia   Interim History / Subjective:  Patient remains  confused  Objective   Blood pressure 128/62, pulse 79, temperature (!) 96 F (35.6 C), temperature source Rectal, resp. rate 16, SpO2 99 %.        Intake/Output Summary (Last 24 hours) at 01/21/2023 1817 Last data filed at 01/21/2023 1402 Gross per 24 hour  Intake 3847.12 ml  Output --  Net 3847.12 ml   There were no vitals filed for this visit.  Examination: General: obese, ill-appearing elderly female Lungs: CTAB BL Cardiovascular: RRR s1/s2, no m/r/r Abdomen: distended soft Extremities: Trace peripheral edema Neuro: Confused   Resolved Hospital Problem list    Assessment & Plan:  #AMS #DM2 #HHS #Non-ketotic hyperglycemia - continue endo tool - likely due to hyperNa plus HHS - continue fluid resuscitation  #Hypernatremia - check urine osm - likely hypo-osmolar hypernatremia - Na corrects to 165 and FWD is about 3.5L - check Na every 2 hours - neuro checks every 2 hours - goal is 10-12 mEq lowering in next 24 hours - D5W-1/2NS at 75cc/hr  #HTN - hold home meds  #HLD - hold home meds  Best Practice (right click and "Reselect all SmartList Selections" daily)   Diet/type: NPO DVT prophylaxis: prophylactic heparin  GI prophylaxis: N/A Lines: N/A Foley:  N/A Code Status:  DNR Last date of multidisciplinary goals of care discussion [daughter]  Labs   CBC: Recent Labs  Lab 01/21/23 0949  WBC 9.6  NEUTROABS 7.2  HGB 12.9  HCT 41.6  MCV 93.5  PLT 157    Basic Metabolic Panel: Recent Labs  Lab 01/21/23 0949 01/21/23 1439  NA 159* 159*  K 4.6  4.1  CL 119* 126*  CO2 29 25  GLUCOSE 622* 480*  BUN 89* 77*  CREATININE 2.23* 1.82*  CALCIUM 9.5 8.1*   GFR: CrCl cannot be calculated (Unknown ideal weight.). Recent Labs  Lab 01/21/23 0949 01/21/23 1439  PROCALCITON  --  0.10  WBC 9.6  --   LATICACIDVEN 1.9  --     Liver Function Tests: Recent Labs  Lab 01/21/23 0949  AST 23  ALT 52*  ALKPHOS 122  BILITOT 0.8  PROT 7.5  ALBUMIN  3.4*   No results for input(s): "LIPASE", "AMYLASE" in the last 168 hours. No results for input(s): "AMMONIA" in the last 168 hours.  ABG    Component Value Date/Time   HCO3 29.7 (H) 01/21/2023 0949   ACIDBASEDEF 1.4 05/05/2021 1755   O2SAT 60.1 01/21/2023 0949     Coagulation Profile: Recent Labs  Lab 01/21/23 0949  INR 1.3*    Cardiac Enzymes: No results for input(s): "CKTOTAL", "CKMB", "CKMBINDEX", "TROPONINI" in the last 168 hours.  HbA1C: Hgb A1c MFr Bld  Date/Time Value Ref Range Status  07/16/2022 04:03 AM 8.4 (H) 4.8 - 5.6 % Final    Comment:    (NOTE) Pre diabetes:          5.7%-6.4%  Diabetes:              >6.4%  Glycemic control for   <7.0% adults with diabetes   12/12/2021 06:04 AM 9.7 (H) 4.8 - 5.6 % Final    Comment:    (NOTE)         Prediabetes: 5.7 - 6.4         Diabetes: >6.4         Glycemic control for adults with diabetes: <7.0     CBG: Recent Labs  Lab 01/21/23 1404 01/21/23 1624  GLUCAP 429* 328*    Review of Systems:   Positives in BOLD: Gen: Denies fever, chills, weight change, fatigue, night sweats HEENT: Denies blurred vision, double vision, hearing loss, tinnitus, sinus congestion, rhinorrhea, sore throat, neck stiffness, dysphagia PULM: Denies shortness of breath, cough, sputum production, hemoptysis, wheezing CV: Denies chest pain, edema, orthopnea, paroxysmal nocturnal dyspnea, palpitations GI: Denies abdominal pain, nausea, vomiting, diarrhea, hematochezia, melena, constipation, change in bowel habits GU: Denies dysuria, hematuria, polyuria, oliguria, urethral discharge Endocrine: Denies hot or cold intolerance, polyuria, polyphagia or appetite change Derm: Denies rash, dry skin, scaling or peeling skin change Heme: Denies easy bruising, bleeding, bleeding gums Neuro: Denies headache, numbness, weakness, slurred speech, loss of memory or consciousness   Past Medical History:  She,  has a past medical history of A-fib  (HCC), Anxiety, CHF (congestive heart failure) (HCC), Diabetes (HCC), GERD (gastroesophageal reflux disease), Hypertension, Sleep apnea, and Stroke (HCC).   Surgical History:   Past Surgical History:  Procedure Laterality Date   PARTIAL HYSTERECTOMY     tubligation       Social History:   reports that she has never smoked. She has never used smokeless tobacco. She reports that she does not currently use alcohol. She reports that she does not use drugs.   Family History:  Her family history includes Breast cancer in her mother; Cancer in her brother; Hypertension in her father; Kidney disease in her brother; Stroke in her father.   Allergies Allergies  Allergen Reactions   Oxycodone-Acetaminophen Nausea And Vomiting    Other reaction(s): Vomiting   Ezetimibe Other (See Comments)    Reaction unknown   Lisinopril Other (See Comments)  Reaction unknown Other reaction(s): Unknown   Statins Other (See Comments)    Reaction unknown      Home Medications  Prior to Admission medications   Medication Sig Start Date End Date Taking? Authorizing Provider  amLODipine (NORVASC) 5 MG tablet Take 1 tablet (5 mg total) by mouth daily. 07/15/21   Pokhrel, Rebekah Chesterfield, MD  atorvastatin (LIPITOR) 80 MG tablet Take 80 mg by mouth daily at 6 PM.    [provider]  bisacodyl (DULCOLAX) 10 MG suppository Place 10 mg rectally as needed for moderate constipation.    [provider]  CONTOUR NEXT TEST test strip 1 each by Other route every evening. 11/14/17   [provider]  feeding supplement (ENSURE ENLIVE / ENSURE PLUS) LIQD Take 237 mLs by mouth 3 (three) times daily between meals. 07/14/21   Pokhrel, Rebekah Chesterfield, MD  furosemide (LASIX) 20 MG tablet Take 30 mg by mouth daily. 12/09/21   [provider]  ibuprofen (ADVIL) 600 MG tablet Take 600 mg by mouth every 6 (six) hours as needed.    [provider]  ketoconazole (NIZORAL) 2 % shampoo Apply 1 Application  topically 2 (two) times a week.    [provider]  LORazepam (ATIVAN) 0.5 MG tablet Take 0.5 mg by mouth every 4 (four) hours as needed for seizure.    [provider]  mirtazapine (REMERON SOL-TAB) 15 MG disintegrating tablet Take 1 tablet (15 mg total) by mouth at bedtime. 05/08/21   Arnetha Courser, MD  Morphine Sulfate (MORPHINE CONCENTRATE) 10 mg / 0.5 ml concentrated solution Take 20 mg by mouth every hour as needed for severe pain or shortness of breath.    [provider]  NON FORMULARY 2 (two) times daily as needed.    [provider]  oxybutynin (DITROPAN) 5 MG tablet Take 5 mg by mouth daily.    [provider]  QUEtiapine (SEROQUEL) 50 MG tablet Take 25 mg by mouth at bedtime. 04/29/21   [provider]  traMADol (ULTRAM) 50 MG tablet Take 50 mg by mouth every 8 (eight) hours as needed.    [provider]     Critical care time:  50 minutes    Rhea Bleacher MD Pulmonary & Critical Care Medicine

## 2023-01-21 NOTE — ED Provider Notes (Signed)
[facility] Provider Note   Event Date/Time   First MD Initiated Contact with Patient 01/21/23 [phone]     (approximate) History  Altered Mental Status  HPI Leah Ritter is a 72 y.o. female with a past medical history of type 2 diabetes, hypertension, and obesity who presents from [facility] nursing facility after facility reports poor intake over the last 3-4 days and significant lethargy this morning.  Patient was found to have glucose in the 600 range.  Patient arrives GCS 11 ROS: Unable to obtain   Physical Exam  Triage Vital Signs: ED Triage Vitals  Enc Vitals Group     BP      Pulse      Resp      Temp      Temp src      SpO2      Weight      Height      Head Circumference      Peak Flow      Pain Score      Pain Loc      Pain Edu?      Excl. in GC?    Most recent vital signs: Vitals:   01/21/23 1015 01/21/23 1030  BP:  (!) 118/54  Pulse:  85  Resp:    Temp: (!) 96 F (35.6 C)   SpO2:  97%   General: Awake, cooperative CV:  Good peripheral perfusion.  Resp:  Normal effort.  Abd:  No distention.  Other:  Elderly overweight African-American female GCS 11 ED Results / Procedures / Treatments  Labs (all labs ordered are listed, but only abnormal results are displayed) Labs Reviewed  COMPREHENSIVE METABOLIC PANEL - Abnormal; Notable for the following components:      Result Value   Sodium 159 (*)    Chloride 119 (*)    Glucose, Bld 622 (*)    BUN 89 (*)    Creatinine, Ser 2.23 (*)    Albumin 3.4 (*)    ALT 52 (*)    GFR, Estimated 23 (*)    All other components within normal limits  PROTIME-INR - Abnormal; Notable for the following components:   Prothrombin Time 16.2 (*)    INR 1.3 (*)    All other components within normal limits  URINALYSIS, W/ REFLEX TO CULTURE (INFECTION SUSPECTED) - Abnormal; Notable for the following components:   Color, Urine YELLOW (*)    APPearance CLOUDY (*)    Glucose, UA >=500 (*)    Bacteria,  UA MANY (*)    All other components within normal limits  BLOOD GAS, VENOUS - Abnormal; Notable for the following components:   Bicarbonate 29.7 (*)    Acid-Base Excess 2.6 (*)    All other components within normal limits  BETA-HYDROXYBUTYRIC ACID - Abnormal; Notable for the following components:   Beta-Hydroxybutyric Acid 0.42 (*)    All other components within normal limits  RESP PANEL BY RT-PCR (RSV, FLU A&B, COVID)  [id number]  CULTURE, BLOOD (ROUTINE X 2)  CULTURE, BLOOD (ROUTINE X 2)  LACTIC ACID, PLASMA  CBC WITH DIFFERENTIAL/PLATELET  APTT  LACTIC ACID, PLASMA   EKG ED ECG REPORT I, Merwyn Katos, the attending physician, personally viewed and interpreted this ECG. Date: 01/21/2023 EKG Time: 1002 Rate: 87 Rhythm: normal sinus rhythm QRS Axis: normal Intervals: normal ST/T Wave abnormalities: normal Narrative Interpretation: no evidence of acute ischemia RADIOLOGY ED MD interpretation: CT of the chest abdomen and pelvis without contrast  interpreted independently by me and shows no acute abnormality.  There is minor coronary artery calcifications  One-view portable chest x-ray interpreted by me shows no evidence of acute abnormalities including no pneumonia, pneumothorax, or widened mediastinum -Agree with radiology assessment Official radiology report(s): CT CHEST ABDOMEN PELVIS WO CONTRAST  Result Date: 01/21/2023 CLINICAL DATA:  Sepsis. EXAM: CT CHEST, ABDOMEN AND PELVIS WITHOUT CONTRAST TECHNIQUE: Multidetector CT imaging of the chest, abdomen and pelvis was performed following the standard protocol without IV contrast. RADIATION DOSE REDUCTION: This exam was performed according to the departmental dose-optimization program which includes automated exposure control, adjustment of the mA and/or kV according to patient size and/or use of iterative reconstruction technique. COMPARISON:  July 06, 2021.  January 13, 2018. FINDINGS: CT CHEST FINDINGS Cardiovascular: Mild  cardiomegaly. No evidence of thoracic aortic aneurysm. No pericardial effusion. Mild coronary artery calcifications are noted. Mediastinum/Nodes: No enlarged mediastinal, hilar, or axillary lymph nodes. Thyroid gland, trachea, and esophagus demonstrate no significant findings. Lungs/Pleura: No pneumothorax or pleural effusion is noted. Stable 7 mm nodule is noted in right lower lobe best seen on image number 57 of series 4. Stable 3 mm nodule is noted in right upper lobe compared to prior exam. These can be considered benign at this point with no further follow-up required. No acute pulmonary abnormality is noted. Musculoskeletal: No chest wall mass or suspicious bone lesions identified. CT ABDOMEN PELVIS FINDINGS Hepatobiliary: Cholelithiasis. No biliary dilatation. Liver is unremarkable. Pancreas: Unremarkable. No pancreatic ductal dilatation or surrounding inflammatory changes. Spleen: Normal in size without focal abnormality. Adrenals/Urinary Tract: Adrenal glands appear normal. Stable right renal cyst is noted for which no further follow-up is required. No hydronephrosis or renal obstruction is noted. Urinary bladder is unremarkable. Stomach/Bowel: Stomach is within normal limits. Appendix appears normal. No evidence of bowel wall thickening, distention, or inflammatory changes. Vascular/Lymphatic: Aortic atherosclerosis. No enlarged abdominal or pelvic lymph nodes. Reproductive: Status post hysterectomy. No adnexal masses. Other: No abdominal wall hernia or abnormality. No abdominopelvic ascites. Musculoskeletal: No acute or significant osseous findings. IMPRESSION: No acute abnormality seen in the chest, abdomen or pelvis. Cholelithiasis. Mild coronary artery calcifications are noted. Aortic Atherosclerosis (ICD10-I70.0). Electronically Signed   By: Lupita Raider M.D.   On: 01/21/2023 12:57   DG Chest Port 1 View  Result Date: 01/21/2023 CLINICAL DATA:  Concern for sepsis. EXAM: PORTABLE CHEST 1 VIEW  COMPARISON:  Chest x-ray dated July 15, 2022 FINDINGS: Cardiac and mediastinal contours are unchanged. Low lung volumes with hypoventilatory changes. No focal consolidation. No evidence of pleural effusion or pneumothorax. IMPRESSION: Low lung volumes with hypoventilatory changes. No focal consolidation. Electronically Signed   By: Allegra Lai M.D.   On: 01/21/2023 10:30   PROCEDURES: Critical Care performed: No Procedures MEDICATIONS ORDERED IN ED: Medications  0.9 %  sodium chloride infusion (has no administration in time range)  cefTRIAXone (ROCEPHIN) 2 g in sodium chloride 0.9 % 100 mL IVPB (0 g Intravenous Stopped 01/21/23 1054)  azithromycin (ZITHROMAX) 500 mg in sodium chloride 0.9 % 250 mL IVPB (0 mg Intravenous Stopped 01/21/23 1223)  sodium chloride 0.9 % bolus 1,000 mL (0 mLs Intravenous Stopped 01/21/23 1224)    And  sodium chloride 0.9 % bolus 1,000 mL (1,000 mLs Intravenous New Bag/Given 01/21/23 1156)    And  sodium chloride 0.9 % bolus 1,000 mL (1,000 mLs Intravenous New Bag/Given 01/21/23 1156)   IMPRESSION / MDM / ASSESSMENT AND PLAN / ED COURSE  I reviewed the triage vital signs  and the nursing notes.                             The patient is on the cardiac monitor to evaluate for evidence of arrhythmia and/or significant heart rate changes. Patient's presentation is most consistent with acute presentation with potential threat to life or bodily function. Patient presents for altered mental status of unknown origin  Will obtain medical workup and discuss with social work to try to obtain collateral information.  Given History, Physical, and Workup there is no overt concern for a dangerous emergent cause such as, but not limited to, CNS infection, severe Toxidrome, severe metabolic derangement, or stroke. Patient does have bacteria in her urine which is possibility of causing patient's altered mental status.  Since patient is so far from her baseline, she will require  admission to the internal medicine service for further evaluation and management. Disposition: Admit; the patient is suffering altered mental status that is persistent and therefore they will be admitted.   FINAL CLINICAL IMPRESSION(S) / ED DIAGNOSES   Final diagnoses:  Altered mental status, unspecified altered mental status type  Hyperglycemia  Lower urinary tract infectious disease   Rx / DC Orders   ED Discharge Orders     None      Note:  This document was prepared using Dragon voice recognition software and may include unintentional dictation errors.   Merwyn Katos, MD 01/21/23 1344

## 2023-01-22 DIAGNOSIS — R739 Hyperglycemia, unspecified: Secondary | ICD-10-CM | POA: Diagnosis not present

## 2023-01-22 LAB — BASIC METABOLIC PANEL
Anion gap: 7 (ref 5–15)
Anion gap: 6 (ref 5–15)
Anion gap: 5 (ref 5–15)
Anion gap: 5 (ref 5–15)
Anion gap: 4 — ABNORMAL LOW (ref 5–15)
Anion gap: 5 (ref 5–15)
Anion gap: 4 — ABNORMAL LOW (ref 5–15)
Anion gap: 4 — ABNORMAL LOW (ref 5–15)
Anion gap: 5 (ref 5–15)
BUN: 58 mg/dL — ABNORMAL HIGH (ref 8–23)
BUN: 55 mg/dL — ABNORMAL HIGH (ref 8–23)
BUN: 48 mg/dL — ABNORMAL HIGH (ref 8–23)
BUN: 44 mg/dL — ABNORMAL HIGH (ref 8–23)
BUN: 40 mg/dL — ABNORMAL HIGH (ref 8–23)
BUN: 38 mg/dL — ABNORMAL HIGH (ref 8–23)
BUN: 38 mg/dL — ABNORMAL HIGH (ref 8–23)
BUN: 35 mg/dL — ABNORMAL HIGH (ref 8–23)
BUN: 31 mg/dL — ABNORMAL HIGH (ref 8–23)
CO2: 27 mmol/L (ref 22–32)
CO2: 28 mmol/L (ref 22–32)
CO2: 27 mmol/L (ref 22–32)
CO2: 26 mmol/L (ref 22–32)
CO2: 25 mmol/L (ref 22–32)
CO2: 25 mmol/L (ref 22–32)
CO2: 26 mmol/L (ref 22–32)
CO2: 26 mmol/L (ref 22–32)
CO2: 25 mmol/L (ref 22–32)
Calcium: 9.1 mg/dL (ref 8.9–10.3)
Calcium: 8.9 mg/dL (ref 8.9–10.3)
Calcium: 9.1 mg/dL (ref 8.9–10.3)
Calcium: 8.7 mg/dL — ABNORMAL LOW (ref 8.9–10.3)
Calcium: 8.6 mg/dL — ABNORMAL LOW (ref 8.9–10.3)
Calcium: 8.8 mg/dL — ABNORMAL LOW (ref 8.9–10.3)
Calcium: 8.6 mg/dL — ABNORMAL LOW (ref 8.9–10.3)
Calcium: 8.7 mg/dL — ABNORMAL LOW (ref 8.9–10.3)
Calcium: 8.8 mg/dL — ABNORMAL LOW (ref 8.9–10.3)
Chloride: 129 mmol/L — ABNORMAL HIGH (ref 98–111)
Chloride: 130 mmol/L — ABNORMAL HIGH (ref 98–111)
Chloride: 129 mmol/L — ABNORMAL HIGH (ref 98–111)
Chloride: 130 mmol/L — ABNORMAL HIGH (ref 98–111)
Chloride: 129 mmol/L — ABNORMAL HIGH (ref 98–111)
Chloride: 130 mmol/L — ABNORMAL HIGH (ref 98–111)
Chloride: 128 mmol/L — ABNORMAL HIGH (ref 98–111)
Chloride: 128 mmol/L — ABNORMAL HIGH (ref 98–111)
Chloride: 127 mmol/L — ABNORMAL HIGH (ref 98–111)
Creatinine, Ser: 1.34 mg/dL — ABNORMAL HIGH (ref 0.44–1.00)
Creatinine, Ser: 1.31 mg/dL — ABNORMAL HIGH (ref 0.44–1.00)
Creatinine, Ser: 1.27 mg/dL — ABNORMAL HIGH (ref 0.44–1.00)
Creatinine, Ser: 1.22 mg/dL — ABNORMAL HIGH (ref 0.44–1.00)
Creatinine, Ser: 1.16 mg/dL — ABNORMAL HIGH (ref 0.44–1.00)
Creatinine, Ser: 1.16 mg/dL — ABNORMAL HIGH (ref 0.44–1.00)
Creatinine, Ser: 1.17 mg/dL — ABNORMAL HIGH (ref 0.44–1.00)
Creatinine, Ser: 1.09 mg/dL — ABNORMAL HIGH (ref 0.44–1.00)
Creatinine, Ser: 1.05 mg/dL — ABNORMAL HIGH (ref 0.44–1.00)
GFR, Estimated: 42 mL/min — ABNORMAL LOW (ref >60)
GFR, Estimated: 43 mL/min — ABNORMAL LOW (ref >60)
GFR, Estimated: 45 mL/min — ABNORMAL LOW (ref >60)
GFR, Estimated: 47 mL/min — ABNORMAL LOW (ref >60)
GFR, Estimated: 50 mL/min — ABNORMAL LOW (ref >60)
GFR, Estimated: 50 mL/min — ABNORMAL LOW (ref >60)
GFR, Estimated: 50 mL/min — ABNORMAL LOW (ref >60)
GFR, Estimated: 54 mL/min — ABNORMAL LOW (ref >60)
GFR, Estimated: 56 mL/min — ABNORMAL LOW (ref >60)
Glucose, Bld: 151 mg/dL — ABNORMAL HIGH (ref 70–99)
Glucose, Bld: 177 mg/dL — ABNORMAL HIGH (ref 70–99)
Glucose, Bld: 221 mg/dL — ABNORMAL HIGH (ref 70–99)
Glucose, Bld: 185 mg/dL — ABNORMAL HIGH (ref 70–99)
Glucose, Bld: 259 mg/dL — ABNORMAL HIGH (ref 70–99)
Glucose, Bld: 243 mg/dL — ABNORMAL HIGH (ref 70–99)
Glucose, Bld: 228 mg/dL — ABNORMAL HIGH (ref 70–99)
Glucose, Bld: 211 mg/dL — ABNORMAL HIGH (ref 70–99)
Glucose, Bld: 197 mg/dL — ABNORMAL HIGH (ref 70–99)
Potassium: 3.7 mmol/L (ref 3.5–5.1)
Potassium: 3.6 mmol/L (ref 3.5–5.1)
Potassium: 3.7 mmol/L (ref 3.5–5.1)
Potassium: 3.7 mmol/L (ref 3.5–5.1)
Potassium: 3.6 mmol/L (ref 3.5–5.1)
Potassium: 4.2 mmol/L (ref 3.5–5.1)
Potassium: 3.6 mmol/L (ref 3.5–5.1)
Potassium: 4.1 mmol/L (ref 3.5–5.1)
Potassium: 3.6 mmol/L (ref 3.5–5.1)
Sodium: 163 mmol/L (ref 135–145)
Sodium: 164 mmol/L (ref 135–145)
Sodium: 161 mmol/L (ref 135–145)
Sodium: 161 mmol/L (ref 135–145)
Sodium: 158 mmol/L — ABNORMAL HIGH (ref 135–145)
Sodium: 160 mmol/L — ABNORMAL HIGH (ref 135–145)
Sodium: 158 mmol/L — ABNORMAL HIGH (ref 135–145)
Sodium: 158 mmol/L — ABNORMAL HIGH (ref 135–145)
Sodium: 157 mmol/L — ABNORMAL HIGH (ref 135–145)

## 2023-01-22 LAB — SODIUM
Sodium: 160 mmol/L — ABNORMAL HIGH (ref 135–145)
Sodium: 165 mmol/L (ref 135–145)

## 2023-01-22 LAB — GLUCOSE, CAPILLARY
Glucose-Capillary: 115 mg/dL — ABNORMAL HIGH (ref 70–99)
Glucose-Capillary: 133 mg/dL — ABNORMAL HIGH (ref 70–99)
Glucose-Capillary: 157 mg/dL — ABNORMAL HIGH (ref 70–99)
Glucose-Capillary: 170 mg/dL — ABNORMAL HIGH (ref 70–99)
Glucose-Capillary: 203 mg/dL — ABNORMAL HIGH (ref 70–99)
Glucose-Capillary: 208 mg/dL — ABNORMAL HIGH (ref 70–99)
Glucose-Capillary: 213 mg/dL — ABNORMAL HIGH (ref 70–99)

## 2023-01-22 LAB — CULTURE, BLOOD (ROUTINE X 2)

## 2023-01-22 LAB — BLOOD CULTURE ID PANEL (REFLEXED) - BCID2

## 2023-01-22 LAB — CBC
HCT: 36.5 % (ref 36.0–46.0)
Hemoglobin: 11.3 g/dL — ABNORMAL LOW (ref 12.0–15.0)
MCH: 29.5 pg (ref 26.0–34.0)
MCHC: 31 g/dL (ref 30.0–36.0)
MCV: 95.3 fL (ref 80.0–100.0)
Platelets: 107 10*3/uL — ABNORMAL LOW (ref 150–400)
RBC: 3.83 MIL/uL — ABNORMAL LOW (ref 3.87–5.11)
RDW: 14.3 % (ref 11.5–15.5)
WBC: 9.4 10*3/uL (ref 4.0–10.5)
nRBC: 0 % (ref 0.0–0.2)

## 2023-01-22 LAB — T4, FREE: Free T4: 1.01 ng/dL (ref 0.61–1.12)

## 2023-01-22 LAB — CORTISOL: Cortisol, Plasma: 10.3 ug/dL

## 2023-01-22 LAB — TSH: TSH: 0.951 u[IU]/mL (ref 0.350–4.500)

## 2023-01-22 MED ORDER — DEXTROSE-NACL 5-0.45 % IV SOLN: INTRAVENOUS | Status: DC

## 2023-01-22 MED ORDER — POTASSIUM CHLORIDE IN NACL 20-0.45 MEQ/L-% IV SOLN
INTRAVENOUS | Status: DC
  Filled 2023-01-22: qty 1000

## 2023-01-22 MED ORDER — KCL IN DEXTROSE-NACL 20-5-0.45 MEQ/L-%-% IV SOLN
INTRAVENOUS | Status: DC
  Filled 2023-01-22 (×2): qty 1000

## 2023-01-22 MED ORDER — NYSTATIN 100000 UNIT/ML MT SUSP
5.0000 mL | Freq: Four times a day (QID) | OROMUCOSAL | Status: DC
  Administered 2023-01-22 – 2023-02-03 (×35): 500000 [IU] via ORAL
  Filled 2023-01-22 (×36): qty 5

## 2023-01-22 MED ORDER — VASOPRESSIN 20 UNITS/100 ML INFUSION FOR SHOCK
0.0000 [IU]/min | INTRAVENOUS | Status: DC
  Filled 2023-01-22: qty 100

## 2023-01-22 MED ORDER — POTASSIUM CL IN DEXTROSE 5% 20 MEQ/L IV SOLN
20.0000 meq | INTRAVENOUS | Status: DC
  Administered 2023-01-22: 20 meq via INTRAVENOUS
  Filled 2023-01-22: qty 1000

## 2023-01-22 MED ORDER — POTASSIUM CL IN DEXTROSE 5% 20 MEQ/L IV SOLN
20.0000 meq | INTRAVENOUS | Status: DC
  Administered 2023-01-23 (×3): 20 meq via INTRAVENOUS
  Filled 2023-01-22 (×5): qty 1000

## 2023-01-22 MED ORDER — POTASSIUM CL IN DEXTROSE 5% 20 MEQ/L IV SOLN: 20.0000 meq | INTRAVENOUS | Status: DC

## 2023-01-22 NOTE — Progress Notes (Addendum)
PHARMACY - PHYSICIAN COMMUNICATION CRITICAL VALUE ALERT - BLOOD CULTURE IDENTIFICATION (BCID)  Leah Ritter is an 72 y.o. female who presented to [facility] on 01/21/2023 with a chief complaint of hyperglycemia  Assessment:  Staph species in 1 of 4 bottles, exact species not recognized, most likely a contaminant (include suspected source if known)  Name of physician (or Provider) Contacted: Webb Silversmith, NP   Current antibiotics: Azithromycin, Ceftriaxone   Changes to prescribed antibiotics recommended:  No, will continue present abx and wait for full report   Results for orders placed or performed during the hospital encounter of 01/21/23  Blood Culture ID Panel (Reflexed) (Collected: 01/21/2023 10:09 AM)  Result Value Ref Range   Enterococcus faecalis NOT DETECTED NOT DETECTED   Enterococcus Faecium NOT DETECTED NOT DETECTED   Listeria monocytogenes NOT DETECTED NOT DETECTED   Staphylococcus species DETECTED (A) NOT DETECTED   Staphylococcus aureus (BCID) NOT DETECTED NOT DETECTED   Staphylococcus epidermidis NOT DETECTED NOT DETECTED   Staphylococcus lugdunensis NOT DETECTED NOT DETECTED   Streptococcus species NOT DETECTED NOT DETECTED   Streptococcus agalactiae NOT DETECTED NOT DETECTED   Streptococcus pneumoniae NOT DETECTED NOT DETECTED   Streptococcus pyogenes NOT DETECTED NOT DETECTED   A.calcoaceticus-baumannii NOT DETECTED NOT DETECTED   Bacteroides fragilis NOT DETECTED NOT DETECTED   Enterobacterales NOT DETECTED NOT DETECTED   Enterobacter cloacae complex NOT DETECTED NOT DETECTED   Escherichia coli NOT DETECTED NOT DETECTED   Klebsiella aerogenes NOT DETECTED NOT DETECTED   Klebsiella oxytoca NOT DETECTED NOT DETECTED   Klebsiella pneumoniae NOT DETECTED NOT DETECTED   Proteus species NOT DETECTED NOT DETECTED   Salmonella species NOT DETECTED NOT DETECTED   Serratia marcescens NOT DETECTED NOT DETECTED   Haemophilus influenzae NOT DETECTED NOT DETECTED    Neisseria meningitidis NOT DETECTED NOT DETECTED   Pseudomonas aeruginosa NOT DETECTED NOT DETECTED   Stenotrophomonas maltophilia NOT DETECTED NOT DETECTED   Candida albicans NOT DETECTED NOT DETECTED   Candida auris NOT DETECTED NOT DETECTED   Candida glabrata NOT DETECTED NOT DETECTED   Candida krusei NOT DETECTED NOT DETECTED   Candida parapsilosis NOT DETECTED NOT DETECTED   Candida tropicalis NOT DETECTED NOT DETECTED   Cryptococcus neoformans/gattii NOT DETECTED NOT DETECTED    [name] 01/22/2023  2:57 AM

## 2023-01-22 NOTE — Evaluation (Addendum)
Clinical/Bedside Swallow Evaluation Patient Details  Name: Leah Ritter MRN: [id number] Date of Birth: 02-Jul-1951  Today's Date: 01/22/2023 Time: SLP Start Time (ACUTE ONLY): 1045 SLP Stop Time (ACUTE ONLY): 1145 SLP Time Calculation (min) (ACUTE ONLY): 60 min  Past Medical History:  Past Medical History:  Diagnosis Date   A-fib (HCC)    Anxiety    CHF (congestive heart failure) (HCC)    Diabetes (HCC)    GERD (gastroesophageal reflux disease)    Hypertension    Sleep apnea    Stroke [facility])    Past Surgical History:  Past Surgical History:  Procedure Laterality Date   PARTIAL HYSTERECTOMY     tubligation     HPI:  Pt is a 72 y.o. female with past medical history of multiple medical issues including Dementia, afib, CHF, Diabetes, history of seizures, hx/o CVA, dysphagia, presenting w/ hypothermia, HHS, hypernatremia, encephalopathy. History from daughter in setting of encephalopathy.  Patient noted to be resident of local skilled nursing facility.  Per report, patient with worsening lethargy over past 24 hours.  Positive worsening confusion.  Noted baseline seizures.  No reported active seizure events.  Positive decreased p.o. intake.  Positive similar symptoms with hyperglycemia in the past.  Daughter noticed that on Tuesday, patient was not acting herself and unable to eat/drink. By today, patient was acting very confused. She was noted to have significant hyperglycemia and brought to ER.    Per CT of Chest this admit: "No acute pulmonary abnormality  is noted.".  Head CT: No evidence of acute intracranial abnormality.  2. Chronic right frontal infarct.  Pt resides at [facility].     Assessment / Plan / Recommendation  Clinical Impression   Pt seen for BSE today. Daughter present. Pt awake, verbal w/ mumbled/muttered speech -- baseline Dementia and Edentulous status. Pt followed commands w/ cues needed. Daughter stated she often "takes awhile to wake up in the mornings and for  her speech to clear -- it's better as the day goes on". Pt is Edentulous w/ Baseline of Dementia.  On RA; afebrile. WBC WNL. Elevated Sodium.   OF NOTE: Pt has a h/o oropharyngeal phase dysphagia and was seen for MBSS on 07/2021 w/ results as described: "pt presents w/ moderate oropharyngeal dysphagia with cognitive, sensory and motor deficits. Protrusion of tissue from posterior wall at level of C6-7 consistent with prominent cricopharyngeus; there is intermittent minimal/mild retention and backflow through the UES, greater with solids/ liquids. Suspect chronic, low grade aspiration, and pt at risk for aspiration with any consistency due to impaired sensation and cognitive impairment. Discussed aspiration risks and options for treatment in the context of progression of neurologic disease, including texture modifications, non-oral means of feeding, and impacts on quality of life, as well as use of strategies with an oral diet which may minimize/mitigate but not eliminate aspiration risk. Recommend Palliative Care consult or discussion with MD to clarify goals of care in context of chronic dysphagia which may worsen with disease progression.".   A Dysphagia drink Cup was recommended as a strategy during oral intake. Pt had been using a Provale drink cup at [facility] intermittently, then stopped per Dtr. Pt is somewhat supervised during meals for Cup drinking and instructed to use small sips, per Dtr. She has been eating a Regular diet at [facility] w/ cutting of some foods when needed -- "she knows how to take it out if she cannot chew it and moisten it more" referring to foods  too tough per Daughter.  OF NOTE: Pt has had only 2 chest Imaging reports in the chart since 2022 and both were "Cardiomegaly with vascular congestion and likely interstitial edema." -- not indicative of aspiration pneumonia.    At this BSE, pt appears to present w/ functional oropharyngeal phase swallowing w/ a slightly  lengthier oral phase w/ textured foods in order to mash/gum adequately b/f swallowing. Bolus management was adequate w/ all trials in setting of declined Cognitive status; Baseline Dementia. During the pharyngeal phase of swallowing, No overt clinical s/s of aspiration were noted. Pt was easily distracted and required MOD+ verbal/visual/tactile cues for follow through. ANY Cognitive decline and distraction can impact her overall attention/awareness/timing of swallow and safety during po tasks which increases risk for aspiration, choking.  Pt's risk for aspiration is present in setting of Cognitive decline, Dementia, Edentulous status, and previous h/o oropharyngeal phase dysphagia as per MBSS in 07/2021. She requires some support at meals. It can be reduced when following aspiration precautions, giving feeding support, reducing distractions during oral intake including talking, and using a strategies to help ensure SINGLE, SMALL sips, such as using a Dysphagia drink cup(Provale cup). Also recommend a slight modified diet consistency w/ chopped/cut foods for ease of oral phase.    Pt consumed trials of thin liquids via Cup(1 sip at at time, small) then puree and soft solids w/ No immediate, overt clinical s/s of aspiration noted; no decline in vocal quality; no cough; and no decline in respiratory status during/post trials. O2 sats remained 98-99%. Oral phase was adequate w/ liquids and purees but min increased Time and slower bolus management during trials of increased texture. W/ min extra Time to complete the mashing/gumming then swallowing, pt cleared oral cavity b/t trials. Pt was able to hold small Cup during drinking.  OM Exam revealed a slightly decreased left tone in corner of mouth; labial closure and strength; min Left lingual weakness. This has been baseline. Potential mild+ white coating on posterior tongue (MD made aware and ordering Nystatin oral rinse).   D/t pt's Baseline, declined Cognitive  status/Dementia and Edentulous status, recommend more of a Mech soft meats/foods-Regular(family to prepare, cut as is done at [facility]) w/ thin liquids via Provale drink Cup or small CUP -- single, small sips monitored; aspiration precautions; reduce Distractions during meals and engage pt at meals for self-feeding. Pills Crushed vs whole in Puree for safer swallowing. Support w/ initiation and prep of plate/tray at meals as needed. Reflux precautions. Recommend Dietician f/u.    Recommend Family to continue f/u w/ Palliative Care to discuss pt's Baseline Cognitive decline and Dysphagia; and QOL choices. No acute ST services indicated currently. Monitoring at her facility for any negative sequelae from suspected aspiration in future. MD/NSG updated. SLP Visit Diagnosis: Dysphagia, oropharyngeal phase (R13.12) (baseline Dementia; need for assistance at meals)    Aspiration Risk  Mild aspiration risk;Risk for inadequate nutrition/hydration (reduced when following general aspiration precautions)    Diet Recommendation   more of a Mech soft meats/foods-Regular(family to prepare, cut as is done at [facility]) w/ thin liquids via Provale drink Cup or small CUP -- single, small sips monitored; aspiration precautions; reduce Distractions during meals and engage pt at meals for self-feeding. Support w/ initiation and prep of plate/tray at meals as needed. Reflux precautions.  Medication Administration: Crushed with puree    Other  Recommendations Recommended Consults:  (Dietician f/u) Oral Care Recommendations: Oral care BID;Oral care before and after PO;Staff/trained caregiver to provide oral  care    Recommendations for follow up therapy are one component of a multi-disciplinary discharge planning process, led by the attending physician.  Recommendations may be updated based on patient status, additional functional criteria and insurance authorization.  Follow up Recommendations No SLP follow up      Assistance  Recommended at Discharge  full  Functional Status Assessment  (pt appears at her baseline per previous reports)  Frequency and Duration  (n/a)   (n/a)       Prognosis Prognosis for improved oropharyngeal function: Fair (-Good) Barriers to Reach Goals: Cognitive deficits;Language deficits;Time post onset;Severity of deficits;Behavior Barriers/Prognosis Comment: baseline Dementia, Edentulous status      Swallow Study   General Date of Onset: 01/21/23 HPI: Pt is a 72 y.o. female with past medical history of multiple medical issues including Dementia, afib, CHF, Diabetes, history of seizures, hx/o CVA, dysphagia, presenting w/ hypothermia, HHS, hypernatremia, encephalopathy. History from daughter in setting of encephalopathy.  Patient noted to be resident of local skilled nursing facility.  Per report, patient with worsening lethargy over past 24 hours.  Positive worsening confusion.  Noted baseline seizures.  No reported active seizure events.  Positive decreased p.o. intake.  Positive similar symptoms with hyperglycemia in the past.  Daughter noticed that on Tuesday, patient was not acting herself and unable to eat/drink. By today, patient was acting very confused. She was noted to have significant hyperglycemia and brought to ER.   Per CT of Chest this admit: "No acute pulmonary abnormality  is noted.".  Head CT: No evidence of acute intracranial abnormality.  2. Chronic right frontal infarct.  Pt resides at [facility]. Type of Study: Bedside Swallow Evaluation Previous Swallow Assessment: BSEs in 2022; 2023.  MBSS in 2022 indicating oropharyngeal phase dysphagia then which has improved per report. Diet Prior to this Study: Regular;Thin liquids (Level 0) (cut foods at NH) Temperature Spikes Noted: No (wbc 9.4;  Sodium elevated) Respiratory Status: Room air History of Recent Intubation: No Behavior/Cognition: Alert;Cooperative;Pleasant mood;Confused;Distractible;Requires cueing Oral Cavity  Assessment: Within Functional Limits -- potential mild+ white coating on posterior tongue (MD made aware and ordering Nystatin oral rinse) Oral Care Completed by SLP: Yes Oral Cavity - Dentition: Edentulous (lost dentures) Vision: Functional for self-feeding Self-Feeding Abilities: Able to feed self;Needs assist;Needs set up (shaky UEs) Patient Positioning: Upright in bed (needed pospitioning support) Baseline Vocal Quality: Low vocal intensity (muttered/mumbled speech) Volitional Cough: Cognitively unable to elicit Volitional Swallow: Unable to elicit    Oral/Motor/Sensory Function Overall Oral Motor/Sensory Function: Within functional limits   Ice Chips Ice chips: Within functional limits Presentation: Spoon (fed; 3 trials)   Thin Liquid Thin Liquid: Within functional limits Presentation: Cup;Self Fed (5 trials; 5 trials) Other Comments: water, juice    Nectar Thick Nectar Thick Liquid: Not tested   Honey Thick Honey Thick Liquid: Not tested   Puree Puree: Within functional limits Presentation: Spoon (fed; 8 tirals)   Solid     Solid: Impaired (Edentulous) Presentation: Spoon (fed; 6 trials) Oral Phase Impairments: Impaired mastication (Edentulous) Oral Phase Functional Implications:  (min Time) Pharyngeal Phase Impairments:  (none) Other Comments: moistened foods well         Jerilynn Som, MS, CCC-SLP Speech Language Pathologist Rehab Services; [facility] [phone] (ascom) Reynald Woods 01/22/2023,2:55 PM

## 2023-01-22 NOTE — H&P (Signed)
See my consult note.   Rhea Bleacher MD Pulmonary & Critical Care Medicine

## 2023-01-22 NOTE — Consult Note (Signed)
NAME:  Leah Ritter, MRN:  [id number], DOB:  05-Jul-1951, LOS: 1 ADMISSION DATE:  01/21/2023, CONSULTATION DATE:  21 Jan 2023 REFERRING MD:  Alvester Morin, CHIEF COMPLAINT:  AMS  Brief Pt Description / Synopsis:   AMS  History of Present Illness:  Per hospitalist HPI: Leah Ritter is a 72 y.o. female with past medical history of multiple medical issues including afib, CHF, diabetes, history of seizures, hx/o CVA presenting w/ hypothermia, HHS, hypernatremia, encephalopathy. History from daughter in setting of encephalopathy.  Patient noted to be resident of local skilled nursing facility.  Per report, patient with worsening lethargy over past 24 hours.  Blood sugars and have been checked over multiple days.  No reported nausea or vomiting.  No reported fevers or chills.  Positive worsening confusion.  Noted baseline seizures.  No reported active seizure events.  Positive decreased p.o. intake.  Positive similar symptoms with hyperglycemia in the past. Presented to the ER temperature 96, BP stable.  Satting well on room air.  White count 9.6, hemoglobin 12.9, platelets 157, blood sugar 622, sodium 159, creatinine 2.23, VBG stable.  COVID flu and RSV negative.  Lactate 1.9, urinalysis with greater than 500 glucose ketones negative no leukocytes or nitrites.  Many bacteria.  Beta-hydroxybutyrate 0.42.  CT head, chest x-ray and CT chest abdomen pelvis grossly stable. Review of Systems: As mentioned in the history of present illness. All other systems reviewed  ED course: patient received 5L of fluids (2L LR and 3L NS). Started on insulin gtt. Much of history was obtained from daughter. Patient suffers from DM2 and dementia. Daughter noticed that on Tuesday, patient was not acting herself and unable to eat/drink. By today, patient was acting very confused. She was noted to have significant hyperglycemia and brought to ER.   PMH: HTN  HLD DM2 Dementia  4/27: admitted yesterday. Remains confused.   Transitioned to [facility] insulin overnight.    Interim History / Subjective:  Patient remains confused  Objective   Blood pressure (!) 140/80, pulse 72, temperature (!) 96.5 F (35.8 C), temperature source Axillary, resp. rate 15, weight 84.9 kg, SpO2 100 %.        Intake/Output Summary (Last 24 hours) at 01/22/2023 4098 Last data filed at 01/22/2023 1191 Gross per 24 hour  Intake 7035.94 ml  Output 1225 ml  Net 5810.94 ml   Filed Weights   01/21/23 1826  Weight: 84.9 kg    Examination: General: obese, ill-appearing elderly female Lungs: CTAB BL Cardiovascular: RRR s1/s2, no m/r/r Abdomen: distended soft Extremities: Trace peripheral edema Neuro: Confused   Resolved Hospital Problem list    Assessment & Plan:  #AMS #DM2 #HHS #Non-ketotic hyperglycemia - off insulin gtt - transitioned to [place] insulin/SSI - likely due to hyperNa plus HHS - continue fluid resuscitation  #Hypernatremia - Urine osm 682 - likely hypo-osmolar hypernatremia - Na corrects to 165 and FWD is about 3.5L - check Na every 2 hours - neuro checks every 2 hours - goal is 10-12 mEq lowering in next 24 hours - Switching D5-1/2NS to straight 1/2NS at 50cc/hr  #Sepsis? - continue CAP coverage for now  #HTN - hold home meds  #HLD - hold home meds  Best Practice (right click and "Reselect all SmartList Selections" daily)   Diet/type: NPO DVT prophylaxis: prophylactic heparin  GI prophylaxis: N/A Lines: N/A Foley:  N/A Code Status:  DNR Last date of multidisciplinary goals of care discussion [daughter]  Labs   CBC: Recent Labs  Lab 01/21/23 0949  01/22/23 0330  WBC 9.6 9.4  NEUTROABS 7.2  --   HGB 12.9 11.3*  HCT 41.6 36.5  MCV 93.5 95.3  PLT 157 107*    Basic Metabolic Panel: Recent Labs  Lab 01/21/23 0949 01/21/23 1439 01/21/23 1844 01/21/23 2041 01/21/23 2333 01/22/23 0130 01/22/23 0330  NA 159* 159* 162* 163* 165* 163* 164*  K 4.6 4.1  --  3.2*  --  3.7 3.6  CL  119* 126*  --  129*  --  129* 130*  CO2 29 25  --  29  --  27 28  GLUCOSE 622* 480*  --  120*  --  151* 177*  BUN 89* 77*  --  67*  --  58* 55*  CREATININE 2.23* 1.82*  --  1.54*  --  1.34* 1.31*  CALCIUM 9.5 8.1*  --  8.5*  --  9.1 8.9   GFR: CrCl cannot be calculated (Unknown ideal weight.). Recent Labs  Lab 01/21/23 0949 01/21/23 1439 01/21/23 1844 01/22/23 0330  PROCALCITON  --  0.10  --   --   WBC 9.6  --   --  9.4  LATICACIDVEN 1.9  --  3.1*  --     Liver Function Tests: Recent Labs  Lab 01/21/23 0949  AST 23  ALT 52*  ALKPHOS 122  BILITOT 0.8  PROT 7.5  ALBUMIN 3.4*   No results for input(s): "LIPASE", "AMYLASE" in the last 168 hours. No results for input(s): "AMMONIA" in the last 168 hours.  ABG    Component Value Date/Time   HCO3 29.7 (H) 01/21/2023 0949   ACIDBASEDEF 1.4 05/05/2021 1755   O2SAT 60.1 01/21/2023 0949     Coagulation Profile: Recent Labs  Lab 01/21/23 0949  INR 1.3*    Cardiac Enzymes: No results for input(s): "CKTOTAL", "CKMB", "CKMBINDEX", "TROPONINI" in the last 168 hours.  HbA1C: Hgb A1c MFr Bld  Date/Time Value Ref Range Status  07/16/2022 04:03 AM 8.4 (H) 4.8 - 5.6 % Final    Comment:    (NOTE) Pre diabetes:          5.7%-6.4%  Diabetes:              >6.4%  Glycemic control for   <7.0% adults with diabetes   12/12/2021 06:04 AM 9.7 (H) 4.8 - 5.6 % Final    Comment:    (NOTE)         Prediabetes: 5.7 - 6.4         Diabetes: >6.4         Glycemic control for adults with diabetes: <7.0     CBG: Recent Labs  Lab 01/21/23 2154 01/21/23 2247 01/22/23 0014 01/22/23 0323 01/22/23 0712  GLUCAP 105* 83 115* 133* 208*    Review of Systems:   Positives in BOLD: Gen: Denies fever, chills, weight change, fatigue, night sweats HEENT: Denies blurred vision, double vision, hearing loss, tinnitus, sinus congestion, rhinorrhea, sore throat, neck stiffness, dysphagia PULM: Denies shortness of breath, cough, sputum  production, hemoptysis, wheezing CV: Denies chest pain, edema, orthopnea, paroxysmal nocturnal dyspnea, palpitations GI: Denies abdominal pain, nausea, vomiting, diarrhea, hematochezia, melena, constipation, change in bowel habits GU: Denies dysuria, hematuria, polyuria, oliguria, urethral discharge Endocrine: Denies hot or cold intolerance, polyuria, polyphagia or appetite change Derm: Denies rash, dry skin, scaling or peeling skin change Heme: Denies easy bruising, bleeding, bleeding gums Neuro: Denies headache, numbness, weakness, slurred speech, loss of memory or consciousness   Past Medical History:  She,  has a past medical history of A-fib (HCC), Anxiety, CHF (congestive heart failure) (HCC), Diabetes (HCC), GERD (gastroesophageal reflux disease), Hypertension, Sleep apnea, and Stroke (HCC).   Surgical History:   Past Surgical History:  Procedure Laterality Date   PARTIAL HYSTERECTOMY     tubligation       Social History:   reports that she has never smoked. She has never used smokeless tobacco. She reports that she does not currently use alcohol. She reports that she does not use drugs.   Family History:  Her family history includes Breast cancer in her mother; Cancer in her brother; Hypertension in her father; Kidney disease in her brother; Stroke in her father.   Allergies Allergies  Allergen Reactions   Oxycodone-Acetaminophen Nausea And Vomiting    Other reaction(s): Vomiting   Ezetimibe Other (See Comments)    Reaction unknown   Lisinopril Other (See Comments)    Reaction unknown Other reaction(s): Unknown   Statins Other (See Comments)    Reaction unknown      Home Medications  Prior to Admission medications   Medication Sig Start Date End Date Taking? Authorizing Provider  amLODipine (NORVASC) 5 MG tablet Take 1 tablet (5 mg total) by mouth daily. 07/15/21   Pokhrel, Rebekah Chesterfield, MD  atorvastatin (LIPITOR) 80 MG tablet Take 80 mg by mouth daily at 6 PM.     [provider]  bisacodyl (DULCOLAX) 10 MG suppository Place 10 mg rectally as needed for moderate constipation.    [provider]  CONTOUR NEXT TEST test strip 1 each by Other route every evening. 11/14/17   [provider]  feeding supplement (ENSURE ENLIVE / ENSURE PLUS) LIQD Take 237 mLs by mouth 3 (three) times daily between meals. 07/14/21   Pokhrel, Rebekah Chesterfield, MD  furosemide (LASIX) 20 MG tablet Take 30 mg by mouth daily. 12/09/21   [provider]  ibuprofen (ADVIL) 600 MG tablet Take 600 mg by mouth every 6 (six) hours as needed.    [provider]  ketoconazole (NIZORAL) 2 % shampoo Apply 1 Application topically 2 (two) times a week.    [provider]  LORazepam (ATIVAN) 0.5 MG tablet Take 0.5 mg by mouth every 4 (four) hours as needed for seizure.    [provider]  mirtazapine (REMERON SOL-TAB) 15 MG disintegrating tablet Take 1 tablet (15 mg total) by mouth at bedtime. 05/08/21   Arnetha Courser, MD  Morphine Sulfate (MORPHINE CONCENTRATE) 10 mg / 0.5 ml concentrated solution Take 20 mg by mouth every hour as needed for severe pain or shortness of breath.    [provider]  NON FORMULARY 2 (two) times daily as needed.    [provider]  oxybutynin (DITROPAN) 5 MG tablet Take 5 mg by mouth daily.    [provider]  QUEtiapine (SEROQUEL) 50 MG tablet Take 25 mg by mouth at bedtime. 04/29/21   [provider]  traMADol (ULTRAM) 50 MG tablet Take 50 mg by mouth every 8 (eight) hours as needed.    [provider]     Critical care time:  30 minutes    Rhea Bleacher MD Pulmonary & Critical Care Medicine

## 2023-01-23 DIAGNOSIS — R739 Hyperglycemia, unspecified: Secondary | ICD-10-CM | POA: Diagnosis not present

## 2023-01-23 LAB — BASIC METABOLIC PANEL
Anion gap: 3 — ABNORMAL LOW (ref 5–15)
Anion gap: 5 (ref 5–15)
Anion gap: 5 (ref 5–15)
Anion gap: 5 (ref 5–15)
Anion gap: 6 (ref 5–15)
Anion gap: 5 (ref 5–15)
Anion gap: 8 (ref 5–15)
BUN: 28 mg/dL — ABNORMAL HIGH (ref 8–23)
BUN: 27 mg/dL — ABNORMAL HIGH (ref 8–23)
BUN: 23 mg/dL (ref 8–23)
BUN: 23 mg/dL (ref 8–23)
BUN: 20 mg/dL (ref 8–23)
BUN: 20 mg/dL (ref 8–23)
BUN: 20 mg/dL (ref 8–23)
CO2: 28 mmol/L (ref 22–32)
CO2: 27 mmol/L (ref 22–32)
CO2: 24 mmol/L (ref 22–32)
CO2: 24 mmol/L (ref 22–32)
CO2: 23 mmol/L (ref 22–32)
CO2: 26 mmol/L (ref 22–32)
CO2: 24 mmol/L (ref 22–32)
Calcium: 8.7 mg/dL — ABNORMAL LOW (ref 8.9–10.3)
Calcium: 8.9 mg/dL (ref 8.9–10.3)
Calcium: 8.8 mg/dL — ABNORMAL LOW (ref 8.9–10.3)
Calcium: 8.7 mg/dL — ABNORMAL LOW (ref 8.9–10.3)
Calcium: 8.9 mg/dL (ref 8.9–10.3)
Calcium: 8.9 mg/dL (ref 8.9–10.3)
Calcium: 8.8 mg/dL — ABNORMAL LOW (ref 8.9–10.3)
Chloride: 124 mmol/L — ABNORMAL HIGH (ref 98–111)
Chloride: 124 mmol/L — ABNORMAL HIGH (ref 98–111)
Chloride: 122 mmol/L — ABNORMAL HIGH (ref 98–111)
Chloride: 121 mmol/L — ABNORMAL HIGH (ref 98–111)
Chloride: 118 mmol/L — ABNORMAL HIGH (ref 98–111)
Chloride: 114 mmol/L — ABNORMAL HIGH (ref 98–111)
Chloride: 111 mmol/L (ref 98–111)
Creatinine, Ser: 1.07 mg/dL — ABNORMAL HIGH (ref 0.44–1.00)
Creatinine, Ser: 1.05 mg/dL — ABNORMAL HIGH (ref 0.44–1.00)
Creatinine, Ser: 1.04 mg/dL — ABNORMAL HIGH (ref 0.44–1.00)
Creatinine, Ser: 1.04 mg/dL — ABNORMAL HIGH (ref 0.44–1.00)
Creatinine, Ser: 1.04 mg/dL — ABNORMAL HIGH (ref 0.44–1.00)
Creatinine, Ser: 1.09 mg/dL — ABNORMAL HIGH (ref 0.44–1.00)
Creatinine, Ser: 1.15 mg/dL — ABNORMAL HIGH (ref 0.44–1.00)
GFR, Estimated: 55 mL/min — ABNORMAL LOW (ref >60)
GFR, Estimated: 56 mL/min — ABNORMAL LOW (ref >60)
GFR, Estimated: 57 mL/min — ABNORMAL LOW (ref >60)
GFR, Estimated: 57 mL/min — ABNORMAL LOW (ref >60)
GFR, Estimated: 57 mL/min — ABNORMAL LOW (ref >60)
GFR, Estimated: 54 mL/min — ABNORMAL LOW (ref >60)
GFR, Estimated: 51 mL/min — ABNORMAL LOW (ref >60)
Glucose, Bld: 163 mg/dL — ABNORMAL HIGH (ref 70–99)
Glucose, Bld: 147 mg/dL — ABNORMAL HIGH (ref 70–99)
Glucose, Bld: 192 mg/dL — ABNORMAL HIGH (ref 70–99)
Glucose, Bld: 242 mg/dL — ABNORMAL HIGH (ref 70–99)
Glucose, Bld: 245 mg/dL — ABNORMAL HIGH (ref 70–99)
Glucose, Bld: 279 mg/dL — ABNORMAL HIGH (ref 70–99)
Glucose, Bld: 316 mg/dL — ABNORMAL HIGH (ref 70–99)
Potassium: 3.5 mmol/L (ref 3.5–5.1)
Potassium: 3.9 mmol/L (ref 3.5–5.1)
Potassium: 3.6 mmol/L (ref 3.5–5.1)
Potassium: 3.9 mmol/L (ref 3.5–5.1)
Potassium: 4 mmol/L (ref 3.5–5.1)
Potassium: 4.2 mmol/L (ref 3.5–5.1)
Potassium: 4 mmol/L (ref 3.5–5.1)
Sodium: 155 mmol/L — ABNORMAL HIGH (ref 135–145)
Sodium: 156 mmol/L — ABNORMAL HIGH (ref 135–145)
Sodium: 151 mmol/L — ABNORMAL HIGH (ref 135–145)
Sodium: 150 mmol/L — ABNORMAL HIGH (ref 135–145)
Sodium: 147 mmol/L — ABNORMAL HIGH (ref 135–145)
Sodium: 145 mmol/L (ref 135–145)
Sodium: 143 mmol/L (ref 135–145)

## 2023-01-23 LAB — CBC
HCT: 35.9 % — ABNORMAL LOW (ref 36.0–46.0)
Hemoglobin: 11.4 g/dL — ABNORMAL LOW (ref 12.0–15.0)
MCH: 29.4 pg (ref 26.0–34.0)
MCHC: 31.8 g/dL (ref 30.0–36.0)
MCV: 92.5 fL (ref 80.0–100.0)
Platelets: 101 10*3/uL — ABNORMAL LOW (ref 150–400)
RBC: 3.88 MIL/uL (ref 3.87–5.11)
RDW: 14 % (ref 11.5–15.5)
WBC: 7.3 10*3/uL (ref 4.0–10.5)
nRBC: 0 % (ref 0.0–0.2)

## 2023-01-23 LAB — GLUCOSE, CAPILLARY
Glucose-Capillary: 132 mg/dL — ABNORMAL HIGH (ref 70–99)
Glucose-Capillary: 169 mg/dL — ABNORMAL HIGH (ref 70–99)
Glucose-Capillary: 228 mg/dL — ABNORMAL HIGH (ref 70–99)
Glucose-Capillary: 247 mg/dL — ABNORMAL HIGH (ref 70–99)
Glucose-Capillary: 239 mg/dL — ABNORMAL HIGH (ref 70–99)
Glucose-Capillary: 301 mg/dL — ABNORMAL HIGH (ref 70–99)

## 2023-01-23 MED ORDER — ATORVASTATIN CALCIUM 20 MG PO TABS
40.0000 mg | ORAL_TABLET | Freq: Every day | ORAL | Status: DC
  Administered 2023-01-23: 40 mg via ORAL
  Filled 2023-01-23 (×3): qty 2

## 2023-01-23 MED ORDER — FUROSEMIDE 10 MG/ML IJ SOLN
40.0000 mg | Freq: Once | INTRAMUSCULAR | Status: AC
  Administered 2023-01-23: 40 mg via INTRAVENOUS
  Filled 2023-01-23: qty 4

## 2023-01-23 MED ORDER — MIRTAZAPINE 15 MG PO TBDP
15.0000 mg | ORAL_TABLET | Freq: Every day | ORAL | Status: DC
  Administered 2023-01-23: 15 mg via ORAL
  Filled 2023-01-23 (×3): qty 1

## 2023-01-23 MED ORDER — AMLODIPINE BESYLATE 5 MG PO TABS
5.0000 mg | ORAL_TABLET | Freq: Every day | ORAL | Status: DC
  Administered 2023-01-23 – 2023-01-25 (×3): 5 mg via ORAL
  Filled 2023-01-23 (×3): qty 1

## 2023-01-23 NOTE — Progress Notes (Signed)
Progress Note   Patient: Leah Ritter ZOX:[id number] DOB: Nov 18, 1950 DOA: 01/21/2023     2 DOS: the patient was seen and examined on 01/23/2023   Brief hospital course:  72 y.o. female with past medical history of multiple medical issues including afib, CHF, diabetes, history of seizures, hx/o CVA presenting w/ hypothermia, HHS, hypernatremia, encephalopathy. History from daughter in setting of encephalopathy.  Patient noted to be resident of local skilled nursing facility.  Per report, patient with worsening lethargy over past 24 hours.  Blood sugars and have been checked over multiple days.  No reported nausea or vomiting.  No reported fevers or chills.  Positive worsening confusion.  Noted baseline seizures.  No reported active seizure events.  Positive decreased p.o. intake.  Positive similar symptoms with hyperglycemia in the past. Presented to the ER temperature 96, BP stable.  Satting well on room air.  White count 9.6, hemoglobin 12.9, platelets 157, blood sugar 622, sodium 159, creatinine 2.23, VBG stable.  COVID flu and RSV negative.  Lactate 1.9, urinalysis with greater than 500 glucose ketones negative no leukocytes or nitrites.  Many bacteria.  Beta-hydroxybutyrate 0.42.  CT head, chest x-ray and CT chest abdomen pelvis grossly stable. Review of Systems: As mentioned in the history of present illness. All other systems reviewed   ED course: patient received 5L of fluids (2L LR and 3L NS). Started on insulin gtt. Much of history was obtained from daughter. Patient suffers from DM2 and dementia. Daughter noticed that on Tuesday, patient was not acting herself and unable to eat/drink. By today, patient was acting very confused. She was noted to have significant hyperglycemia and brought to ER.     4/28 : Patient was downgraded from ICU after completion of DKA treatment.  Patient was still hyponatremic however had target improvement in sodium levels.  Assessment and Plan:  AMS / DM2 / HHS  / Non-ketotic hyperglycemia -still altered however HHS and nonketotic hyperglycemia resolved.  - off insulin gtt - transitioned to [place] insulin/SSI - likely due to hyperNa plus HHS - continue fluid resuscitation   Hypernatremia : Sodium correction on target with improvement from 1 60-1 50 today. - Urine osm 682 - likely hypo-osmolar hypernatremia - Na corrects to 165 and FWD is about 3.5L - check Na every 2 hours , and the sodium level is 140 - neuro checks every 4 hours - goal is 10-12 mEq lowering in next 24 hours -Continue with 1/2NS at 50cc/hr    Sepsis -Patient was initially treated for community-acquired pneumonia.  Antibiotics were discontinued later in the course as patient had no leukocytosis afebrile with white count of 7.3, procalcitonin 0.10.    HTN - hold home meds    HLD - hold home meds      Subjective: Patient seen and examined this morning.  Laying in the bed.  Patient is alert and awake however nonconversant.  Vital labs and imaging reviewed.  Patient vitally stable labs with persistent hyponatremia which is improving.  No new imaging.  Physical Exam: Vitals:   01/23/23 1000 01/23/23 1100 01/23/23 1200 01/23/23 1300  BP: 136/66 139/72 (!) 148/78 (!) 149/76  Pulse: 70 75 70 78  Resp: 15 12 17  (!) 23  Temp:   98.6 F (37 C)   TempSrc:   Oral   SpO2: 99% 100% 100% 100%  Weight:       Physical Exam Constitutional:      Appearance: She is obese.  HENT:     Head:  Normocephalic and atraumatic.     Mouth/Throat:     Mouth: Mucous membranes are dry.  Eyes:     Extraocular Movements: Extraocular movements intact.     Pupils: Pupils are equal, round, and reactive to light.  Cardiovascular:     Rate and Rhythm: Normal rate.  Pulmonary:     Effort: Pulmonary effort is normal.  Abdominal:     General: Abdomen is flat.     Comments: Obese abdomen  Genitourinary:    Comments: Foley in place Musculoskeletal:     Comments: Bilateral lower extremity swelling   Neurological:     General: No focal deficit present.     Mental Status: She is alert.  Psychiatric:     Comments: Patient nonconversant and mute     Data Reviewed:  There are no new results to review at this time.  Family Communication: None by bedside  Disposition: Status is: Inpatient Remains inpatient appropriate because: Severe high per natremia   planned Discharge Destination:  TBD     Time spent: 33 minutes  Author: Kirstie Peri, MD 01/23/2023 2:28 PM  For on call review [email or web address].

## 2023-01-23 NOTE — Progress Notes (Signed)
PHARMACY CONSULT NOTE  Pharmacy Consult for Electrolyte Monitoring and Replacement   Recent Labs: Potassium (mmol/L)  Date Value  01/23/2023 4.0   Magnesium (mg/dL)  Date Value  16/06/9603 2.1   Calcium (mg/dL)  Date Value  54/05/8118 8.9   Albumin (g/dL)  Date Value  14/78/2956 3.4 (L)   Phosphorus (mg/dL)  Date Value  21/30/8657 2.9   Sodium (mmol/L)  Date Value  01/23/2023 147 (H)     Assessment: 72 year old female admitted to CCU with HHS and hypernatremia. PMH includes T2DM (A1c 8.4% in October of 2023) .  Started on IV insulin gtt, transitioned off on 01/22/23. Now on Semglee 10 units daily and moderate sliding scale insulin. Last CBG 248.  Goal of Therapy:  Electrolytes within normal limits  Plan:  --Hypernatremia: serum Na 147, corrected Na 151. furosemide 40 mg x 1 ordered for this afternoon.  Continues on D5W with Kcl 83meq/L. BMP ordered Q4hr. Next labs due @1900    Elliot Gurney, PharmD, BCPS Clinical Pharmacist  01/23/2023 3:12 PM

## 2023-01-24 DIAGNOSIS — R739 Hyperglycemia, unspecified: Secondary | ICD-10-CM | POA: Diagnosis not present

## 2023-01-24 LAB — BASIC METABOLIC PANEL
Anion gap: 7 (ref 5–15)
Anion gap: 8 (ref 5–15)
BUN: 22 mg/dL (ref 8–23)
BUN: 21 mg/dL (ref 8–23)
CO2: 24 mmol/L (ref 22–32)
CO2: 25 mmol/L (ref 22–32)
Calcium: 8.7 mg/dL — ABNORMAL LOW (ref 8.9–10.3)
Calcium: 8.9 mg/dL (ref 8.9–10.3)
Chloride: 109 mmol/L (ref 98–111)
Chloride: 110 mmol/L (ref 98–111)
Creatinine, Ser: 1.18 mg/dL — ABNORMAL HIGH (ref 0.44–1.00)
Creatinine, Ser: 1.23 mg/dL — ABNORMAL HIGH (ref 0.44–1.00)
GFR, Estimated: 49 mL/min — ABNORMAL LOW (ref >60)
GFR, Estimated: 47 mL/min — ABNORMAL LOW (ref >60)
Glucose, Bld: 441 mg/dL — ABNORMAL HIGH (ref 70–99)
Glucose, Bld: 351 mg/dL — ABNORMAL HIGH (ref 70–99)
Potassium: 3.8 mmol/L (ref 3.5–5.1)
Potassium: 3.7 mmol/L (ref 3.5–5.1)
Sodium: 140 mmol/L (ref 135–145)
Sodium: 143 mmol/L (ref 135–145)

## 2023-01-24 LAB — CBC
HCT: 37.3 % (ref 36.0–46.0)
Hemoglobin: 12.1 g/dL (ref 12.0–15.0)
MCH: 29 pg (ref 26.0–34.0)
MCHC: 32.4 g/dL (ref 30.0–36.0)
MCV: 89.4 fL (ref 80.0–100.0)
Platelets: 99 10*3/uL — ABNORMAL LOW (ref 150–400)
RBC: 4.17 MIL/uL (ref 3.87–5.11)
RDW: 13.5 % (ref 11.5–15.5)
WBC: 7.2 10*3/uL (ref 4.0–10.5)
nRBC: 0.3 % — ABNORMAL HIGH (ref 0.0–0.2)

## 2023-01-24 LAB — GLUCOSE, CAPILLARY
Glucose-Capillary: 370 mg/dL — ABNORMAL HIGH (ref 70–99)
Glucose-Capillary: 307 mg/dL — ABNORMAL HIGH (ref 70–99)
Glucose-Capillary: 145 mg/dL — ABNORMAL HIGH (ref 70–99)
Glucose-Capillary: 84 mg/dL (ref 70–99)
Glucose-Capillary: 153 mg/dL — ABNORMAL HIGH (ref 70–99)

## 2023-01-24 LAB — HEMOGLOBIN A1C
Hgb A1c MFr Bld: 12.6 % — ABNORMAL HIGH (ref 4.8–5.6)
Mean Plasma Glucose: 314.92 mg/dL

## 2023-01-24 MED ORDER — INSULIN ASPART 100 UNIT/ML IJ SOLN: 4.0000 [IU] | Freq: Three times a day (TID) | INTRAMUSCULAR | Status: DC

## 2023-01-24 MED ORDER — INSULIN GLARGINE-YFGN 100 UNIT/ML ~~LOC~~ SOLN
16.0000 [IU] | Freq: Every day | SUBCUTANEOUS | Status: DC
  Administered 2023-01-24: 16 [IU] via SUBCUTANEOUS
  Filled 2023-01-24 (×2): qty 0.16

## 2023-01-24 MED ORDER — INSULIN ASPART 100 UNIT/ML IJ SOLN
0.0000 [IU] | Freq: Three times a day (TID) | INTRAMUSCULAR | Status: DC
  Administered 2023-01-24: 2 [IU] via SUBCUTANEOUS

## 2023-01-24 MED ORDER — INSULIN STARTER KIT- PEN NEEDLES (ENGLISH)
1.0000 | Freq: Once | Status: AC
  Administered 2023-01-24: 1
  Filled 2023-01-24: qty 1

## 2023-01-24 MED ORDER — ORAL CARE MOUTH RINSE
15.0000 mL | OROMUCOSAL | Status: DC
  Administered 2023-01-24 – 2023-02-02 (×21): 15 mL via OROMUCOSAL

## 2023-01-24 MED ORDER — INSULIN ASPART 100 UNIT/ML IJ SOLN: 0.0000 [IU] | Freq: Three times a day (TID) | INTRAMUSCULAR | Status: DC

## 2023-01-24 MED ORDER — ORAL CARE MOUTH RINSE: 15.0000 mL | OROMUCOSAL | Status: DC | PRN

## 2023-01-24 MED ORDER — LIVING WELL WITH DIABETES BOOK
Freq: Once | Status: AC
  Filled 2023-01-24: qty 1

## 2023-01-24 MED ORDER — INSULIN ASPART 100 UNIT/ML IJ SOLN
0.0000 [IU] | Freq: Every day | INTRAMUSCULAR | Status: DC
  Filled 2023-01-24: qty 1

## 2023-01-24 NOTE — Progress Notes (Signed)
       CROSS COVER NOTE  NAME: Maiya Kates MRN: [id number] DOB : 09/22/1951 ATTENDING PHYSICIAN: Kirstie Peri, MD  Message received from Francella Solian RN   "I just wanted to make you aware of this pt's cbg's/@ 19:21- 239 (did not receive coverage: was tied up with room 7 and room 5 and was too close to 12am dose so she received dose at 2300.- see next note) / @23 :25- 301 (she got 5 units of aspart insulin & 10units of semeglee) / @03 :44- 370 (she now needs 8 units)  ** I have order parameters but was told the protocol is to notify you all for 2x cbg's >250 "  CBG likely elevated in setting of skipped SSI 2000 dose and and subsequent SSI under-dosing at 2309. Received 5U of novolog for CBG of 301, should have received 11U per Moderate scale.  Plan:  - Continue with SSI as ordered   To reach the provider On-Call:   7AM- 7PM see care teams to locate the attending and reach out to them via [email or web address] Password: [email or web address] 7PM-7AM contact night-coverage If you still have difficulty reaching the appropriate provider, please page the [facility] (Director on Call) for [facility] on amion for assistance  This document was prepared using Conservation officer, historic buildings and may include unintentional dictation errors.  Bishop Limbo DNP, MBA, FNP-BC, PMHNP-BC Nurse Practitioner [facility] Pager [phone]

## 2023-01-24 NOTE — TOC Initial Note (Signed)
Transition of Care [facility]) - Initial/Assessment Note    Patient Details  Name: Leah Ritter MRN: [id number] Date of Birth: 07-Sep-1951  Transition of Care [facility] CM/SW Contact:    Margarito Liner, LCSW Phone Number: 01/24/2023, 11:49 AM  Clinical Narrative:   No family at bedside. Called daughter/legal guardian, introduced role, and explained that discharge planning would be discussed. Patient is from [facility] on their memory care side. She is active with [facility] at the facility. CSW notified their hospice liaison that she was here. No further concerns. CSW encouraged patient's daughter to contact CSW as needed. CSW will continue to follow patient and her daughter for support and facilitate return to the facility once medically stable. Daughter typically transports her but she will be out of town Friday-Sunday so she said facility may have to transport depending on the day.               Expected Discharge Plan: Memory Care (with hospice) Barriers to Discharge: Continued Medical Work up   Patient Goals and CMS Choice     Choice offered to / list presented to : Adult Children      Expected Discharge Plan and Services     Post Acute Care Choice: Resumption of Svcs/PTA Provider Living arrangements for the past 2 months: Assisted Living Facility (Memory Care side)                                      Prior Living Arrangements/Services Living arrangements for the past 2 months: Assisted Living Facility (Memory Care side) Lives with:: Facility Resident Patient language and need for interpreter reviewed:: Yes Do you feel safe going back to the place where you live?: Yes      Need for Family Participation in Patient Care: Yes (Comment) Care giver support system in place?: Yes (comment) Current home services: Hospice Criminal Activity/Legal Involvement Pertinent to Current Situation/Hospitalization: No - Comment as needed  Activities of Daily Living       Permission Sought/Granted Permission sought to share information with : Facility Medical sales representative, Family Supports, Guardian    Share Information with NAME: Fredric Mare  Permission granted to share info w AGENCY: [employer]  Permission granted to share info w Relationship: Daughter/legal guardian  Permission granted to share info w Contact Information: [phone]  Emotional Assessment Appearance:: Appears stated age Attitude/Demeanor/Rapport: Unable to Assess Affect (typically observed): Unable to Assess Orientation: : Oriented to Self Alcohol / Substance Use: Not Applicable Psych Involvement: No (comment)  Admission diagnosis:  Lower urinary tract infectious disease [N39.0] Hyperglycemia [R73.9] Hyperglycemia without ketosis [R73.9] Altered mental status, unspecified altered mental status type [R41.82] Patient Active Problem List   Diagnosis Date Noted   Hyperglycemia without ketosis 01/21/2023   Seizure (HCC) 07/16/2022   Sepsis due to gram-negative UTI (HCC) 07/16/2022   Dyslipidemia 07/16/2022   Essential hypertension 07/16/2022   Paroxysmal atrial fibrillation (HCC) 07/16/2022   Dementia with behavioral disturbance (HCC) 07/16/2022   Uncontrolled type 2 diabetes mellitus with hypoglycemia, without long-term current use of insulin (HCC) 07/16/2022   Cystocele with prolapse 12/15/2021   Bacteremia 12/12/2021   UTI (urinary tract infection) 12/11/2021   Abnormal LFTs 12/11/2021   Hyperkalemia 12/10/2021   Anemia 12/10/2021   Acute metabolic encephalopathy 07/06/2021   Hypothermia 07/06/2021   Type II diabetes mellitus with renal manifestations (HCC) 07/06/2021   History of stroke    A-fib (  HCC)    Chronic diastolic CHF (congestive heart failure) (HCC)    Depression with anxiety    CKD (chronic kidney disease), stage IIIb    Aspiration pneumonia (HCC)    Nausea & vomiting    AKI (acute kidney injury) (HCC)    Hyperglycemia    SIRS (systemic  inflammatory response syndrome) (HCC) 01/09/2019   Acute on chronic systolic CHF (congestive heart failure) (HCC) 01/09/2019   Pain in joint involving ankle and foot 01/03/2018   Localized, primary osteoarthritis of shoulder region 01/03/2018   Rupture of tendon of biceps, long head 01/03/2018   Disorder of bursae of shoulder region 01/03/2018   Screening declined by patient 10/25/2017   Full thickness rotator cuff tear 04/21/2016   Chronic bilateral low back pain without sciatica 02/10/2016   H/O rotator cuff tear 02/10/2016   History of vitamin D deficiency 02/10/2016   Type 2 diabetes mellitus without complication, with long-term current use of insulin (HCC) 02/10/2016   HLD (hyperlipidemia) 11/24/2006   OBESITY, NOS 11/24/2006   POST TRAUMATIC STRESS DISORDER 11/24/2006   DEPRESSIVE DISORDER, NOS 11/24/2006   HYPERTENSION, BENIGN SYSTEMIC 11/24/2006   RHINITIS, ALLERGIC 11/24/2006   GERD (gastroesophageal reflux disease) 11/24/2006   OSTEOARTHRITIS, MULTI SITES 11/24/2006   Sleep apnea 11/24/2006   PCP:  Almetta Lovely, [facility]:   [facility] [place], [place] - [address] [place] [address] Phone: [phone] Fax: [phone]     Social Determinants of Health (SDOH) Social History: SDOH Screenings   Food Insecurity: No Food Insecurity (07/16/2022)  Housing: Low Risk  (07/16/2022)  Transportation Needs: No Transportation Needs (07/16/2022)  Utilities: Not At Risk (07/16/2022)  Alcohol Screen: Low Risk  (09/05/2018)  Tobacco Use: Low Risk  (01/21/2023)   SDOH Interventions:     Readmission Risk Interventions     No data to display

## 2023-01-24 NOTE — Evaluation (Signed)
Physical Therapy Evaluation Patient Details Name: Jaci Desanto MRN: [id number] DOB: [birth year] Today's Date: 01/24/2023  History of Present Illness  presented to ER secondary to AMS, lethargy, unable to eat/drink; admitted for management of non-ketotic hyperglycemia  Clinical Impression  Patient sleeping upon arrival to room, but awakens to voice/light touch. Verbally responds and intermittently opens eyes, but requires consistent cuing/stimulation to maintain eyes open. Oriented to self only; demonstrates very limited ability to actively follow purposeful commands.  Often requires hand-over-hand to initiate and guide movement, but generally resistive to act assist/passive movement from therapist at times (UEs > LEs).  No clinical indicators of pain appreciated, 0/10 on PAINAD scale.  Globally weak and deconditioned throughout all extremities; no significant focal weakness appreciated.  Currently requiring dep assist +2 for all bed mobility and repositioning; generally resistant to any further mobility attempts.  Will continue to assess/progress further in subsequent sessions as patient medically appropriate and alert/awake for additional mobility. Would benefit from skilled PT to address above deficits and promote optimal return to PLOF; will benefit optimally from moderate intensity post-acute PT services (<3 hours/day) and consistent staff assist for ADLs/mobility.        Recommendations for follow up therapy are one component of a multi-disciplinary discharge planning process, led by the attending physician.  Recommendations may be updated based on patient status, additional functional criteria and insurance authorization.  Follow Up Recommendations Can patient physically be transported by private vehicle: No     Assistance Recommended at Discharge Frequent or constant Supervision/Assistance  Patient can return home with the following  Two people to help with walking and/or transfers;Two  people to help with bathing/dressing/bathroom    Equipment Recommendations    Recommendations for Other Services       Functional Status Assessment Patient has had a recent decline in their functional status and demonstrates the ability to make significant improvements in function in a reasonable and predictable amount of time.     Precautions / Restrictions Precautions Precautions: Fall Restrictions Weight Bearing Restrictions: No      Mobility  Bed Mobility Overal bed mobility: Needs Assistance Bed Mobility: Rolling Rolling: Total assist, +2 for physical assistance         General bed mobility comments: rolling and scooting up in bed, dep assist +2; very minimal active assist with transitional movements    Transfers                   General transfer comment: unsafe/unable; generally resistive to all movement efforts; anticipate +2 required    Ambulation/Gait               General Gait Details: unsafe/unable; generally resistive to all movement efforts; anticipate +2 required  Stairs            Wheelchair Mobility    Modified Rankin (Stroke Patients Only)       Balance                                             Pertinent Vitals/Pain Pain Assessment Breathing: normal Negative Vocalization: none Facial Expression: smiling or inexpressive Body Language: relaxed Consolability: no need to console PAINAD Score: 0    Home Living Family/patient expects to be discharged to:: Assisted living                 Home Equipment: Rollator (4 wheels) Additional  Comments: Per chart, Lives at [facility]    Prior Function Prior Level of Function : Needs assist             Mobility Comments: Per previous documentation (6 months prior), patient modified independent ambulating with rollator; recent falls (last one last Tuesday--usually found sitting on floor near bed; recent back pain since last fall) ADLs  Comments: Per previous documentation (6 months prior), wears depends; assist for bathing, changing, medications, meals, cleaning. Per daughter, often refuses oral care- daughter has been unable to remove pt's dentures for cleaning in several months until yesterday.     Hand Dominance   Dominant Hand: Right    Extremity/Trunk Assessment   Upper Extremity Assessment Upper Extremity Assessment: Generalized weakness (grossly 3-/5 throughout, act assist ROM grossly WFL for self-care needs; however, intermittently resistive to movement by therapist)    Lower Extremity Assessment Lower Extremity Assessment: Generalized weakness (grossly 3-/5 throughout, act assist to initiate/complete tasks)       Communication   Communication: Expressive difficulties (generally garbled and incomprehensible)  Cognition Arousal/Alertness: Lethargic Behavior During Therapy: Flat affect Overall Cognitive Status: No family/caregiver present to determine baseline cognitive functioning                                 General Comments: Oriented to self only; very limited ability to follow commands, often requiring hand-over-hand (but generally resistive to this)        General Comments      Exercises Other Exercises Other Exercises: Rolling bilat, dep assist +2 for hygiene after incontinent bowel episode (patient unaware, unable to effectively communicate); dep assist for hygiene.  Patient frequently attempting to place hands in/near stool; constant assist for redirection (improved when given something to hold/manipulate with hands during task).   Assessment/Plan    PT Assessment Patient needs continued PT services  PT Problem List Decreased strength;Decreased range of motion;Decreased activity tolerance;Decreased balance;Decreased mobility;Decreased coordination;Decreased cognition;Decreased safety awareness;Decreased knowledge of precautions;Decreased knowledge of use of DME       PT  Treatment Interventions DME instruction;Gait training;Functional mobility training;Therapeutic activities;Patient/family education;Cognitive remediation;Balance training;Therapeutic exercise;Neuromuscular re-education    PT Goals (Current goals can be found in the Care Plan section)  Acute Rehab PT Goals PT Goal Formulation: Patient unable to participate in goal setting Time For Goal Achievement: 02/07/23 Potential to Achieve Goals: Fair    Frequency Min 3X/week     Co-evaluation               AM-PAC PT "6 Clicks" Mobility  Outcome Measure Help needed turning from your back to your side while in a flat bed without using bedrails?: Total Help needed moving from lying on your back to sitting on the side of a flat bed without using bedrails?: Total Help needed moving to and from a bed to a chair (including a wheelchair)?: Total Help needed standing up from a chair using your arms (e.g., wheelchair or bedside chair)?: Total Help needed to walk in hospital room?: Total Help needed climbing 3-5 steps with a railing? : Total 6 Click Score: 6    End of Session   Activity Tolerance: Patient limited by lethargy Patient left: in bed;with call bell/phone within reach Nurse Communication: Mobility status PT Visit Diagnosis: Muscle weakness (generalized) (M62.81);Difficulty in walking, not elsewhere classified (R26.2)    Time: 1030-1047 PT Time Calculation (min) (ACUTE ONLY): 17 min   Charges:   PT  Evaluation $PT Eval Moderate Complexity: 1 Mod         Delvina Mizzell H. Manson Passey, PT, DPT, NCS 01/24/23, 1:17 PM [phone]

## 2023-01-24 NOTE — Hospital Course (Addendum)
PMH of type II DM, seizures, CVA, chronic diastolic CHF, OSA, CKD 3a, HTN, HLD, chronic A-fib presented to hospital with complaints of poor p.o. intake and lethargy.  Workup in the ED showed blood sugars of 622 with hypothermia and hypernatremia concerning for hyperglycemic nonketotic hyperosmolar state.  Admitted to the ICU. Treated with IV fluids and IV insulin therapy.  Transfer to hospital service after completion of the IV insulin protocol. Patient had ongoing encephalopathy with poor p.o. intake with frequent hypoglycemic spells. On 5/3 mentation improving significantly, able to answer questions appropriately as well as able to eat adequately and able to ambulate in the hallway with physical therapy.

## 2023-01-24 NOTE — Progress Notes (Addendum)
PROGRESS NOTE    Leah Ritter  ZOX:[id number] DOB: [birth year] DOA: 01/21/2023 PCP: [facility]    Brief Narrative:  Patient is a 72 y.o. female with past medical history of multiple medical issues including atrial fibrillation, congestive heart failure, diabetes mellitus type 2, history of seizures, history of CVA presented to the hospital from skilled nursing facility with worsening lethargy and confusion.  Patient also had decreased oral intake and had history of hypoglycemia in the past.  In the ED, patient was noted to have blood glucose level of 622 with sodium of 159 and creatinine of 2.2.  Influenza, COVID and RSV was negative.  Lactate was 1.9.  Urinalysis showed glucose and many bacteria.  CT head, chest x-ray and chest abdomen and pelvis were normal.  In the ED, patient received 5 L of IV fluid and was started on insulin drip and was admitted hospital for further evaluation and treatment.  Subsequently, on 4/28 patient was downgraded from ICU after completion of hyperglycemia.  Assessment and Plan:  Acute metabolic encephalopathy likely secondary to nonketotic hyper osmolar state.   Initially received IV fluids and insulin drip.  Currently has been transitioned to subcu insulin and sliding scale insulin.  Had significant hypernatremia which has improved.  Discontinue D5 infusion.  Latest POC glucose of 307.  Last hemoglobin A1c 6 months back was 8.4.  Hemoglobin A1c today at 12.6.  Likely new diagnosis of diabetes.  Was not on any medications.  Diabetic coordinator on board and will add long-acting mealtime and sliding scale insulin.  Will adjust insulin regimen adequately prior to discharge.  Patient is currently at assisted living facility memory care unit.  History of dementia.  Patient mildly interactive but difficult to comprehend speech.  Currently at the memory care unit at assisted living facility.  At baseline patient is able to walk but walks around with assistance.   Able to speak some.  Feeds and drinks on herself but needs assistance with dressing bathing.  Mostly recognizes her daughter.    Hypernatremia : Has significantly improved at this time at 143 this morning.   Community-acquired pneumonia.  On Rocephin and Zithromax.  Sepsis was ruled out.  No fever leukocytosis at this time.    Staff epidermidis bacteremia.  Likely contaminant.    HTN Antihypertensive medications on hold.  Patient is on amlodipine at home.  Blood pressure marginally low at this time will closely monitor.   Hyperlipidemia. Lipitor on hold at this time.  Mild AKI.  Creatinine today at 1.2 after hydration.  Initial creatinine at 2.2.  Has improved.  Debility, weakness.  Will get PT evaluation.  She is currently at the memory care unit of assisted living facility.   DVT prophylaxis: heparin injection 5,000 Units Start: 01/21/23 1730 SCDs Start: 01/21/23 1719   Code Status:     Code Status: DNR  Disposition: Assisted living facility memory care unit likely in 1 to 2 days.  Status is: Inpatient  Remains inpatient appropriate because: Hyperglycemia, need for insulin adjustment, PT evaluation pending,   Family Communication: I did speak with the patient's daughter on the phone and updated her about the clinical condition of the patient.  Consultants:  None at bedside  Procedures:  None  Antimicrobials:  Rocephin and Zithromax IV.  Anti-infectives (From admission, onward)    Start     Dose/Rate Route Frequency Ordered Stop   01/21/23 0945  cefTRIAXone (ROCEPHIN) 2 g in sodium chloride 0.9 % 100 mL IVPB  2 g 200 mL/hr over 30 Minutes Intravenous Every 24 hours 01/21/23 0941 01/26/23 0944   01/21/23 0945  azithromycin (ZITHROMAX) 500 mg in sodium chloride 0.9 % 250 mL IVPB        500 mg 250 mL/hr over 60 Minutes Intravenous Every 24 hours 01/21/23 0941 01/26/23 0944      Subjective: Today, patient was seen and examined at bedside.  Difficulty  comprehending speech but able to answer a few questions.  Denies any pain, nausea or vomiting.  Patient's daughter concerned about her poor oral intake.  Objective: Vitals:   01/24/23 1000 01/24/23 1100 01/24/23 1200 01/24/23 1300  BP: (!) 105/49  112/76 (!) 99/58  Pulse: 96 90 80 84  Resp: 20 (!) 23 16 17   Temp:    97.7 F (36.5 C)  TempSrc:    Axillary  SpO2: 96% 96% 97% 98%  Weight:        Intake/Output Summary (Last 24 hours) at 01/24/2023 1304 Last data filed at 01/24/2023 1200 Gross per 24 hour  Intake 2338.33 ml  Output 40981 ml  Net -10486.67 ml   Filed Weights   01/21/23 1826  Weight: 84.9 kg    Physical Examination: Body mass index is 27.64 kg/m.   General:  Average built, not in obvious distress, appears weak and deconditioned, Communicative but difficult to comprehend. HENT:   No scleral pallor or icterus noted. Oral mucosa is moist.  Chest: Coarse breath sounds noted.. No crackles or wheezes.  CVS: S1 &S2 heard. No murmur.  Regular rate and rhythm. Abdomen: Soft, nontender, nondistended.  Bowel sounds are heard.   Extremities: No cyanosis, clubbing or edema.  Peripheral pulses are palpable. Psych: Alert, awake but confused and has indistinct speech, history of underlying dementia, CNS:  No cranial nerve deficits.  Moves extremities. Skin: Warm and dry.  No rashes noted.  Data Reviewed:   CBC: Recent Labs  Lab 01/21/23 0949 01/22/23 0330 01/23/23 0709 01/24/23 0626  WBC 9.6 9.4 7.3 7.2  NEUTROABS 7.2  --   --   --   HGB 12.9 11.3* 11.4* 12.1  HCT 41.6 36.5 35.9* 37.3  MCV 93.5 95.3 92.5 89.4  PLT 157 107* 101* 99*    Basic Metabolic Panel: Recent Labs  Lab 01/23/23 1448 01/23/23 1747 01/23/23 2205 01/24/23 0226 01/24/23 0626  NA 147* 145 143 140 143  K 4.0 4.2 4.0 3.8 3.7  CL 118* 114* 111 109 110  CO2 23 26 24 24 25   GLUCOSE 245* 279* 316* 441* 351*  BUN 20 20 20 22 21   CREATININE 1.04* 1.09* 1.15* 1.18* 1.23*  CALCIUM 8.9 8.9 8.8*  8.7* 8.9    Liver Function Tests: Recent Labs  Lab 01/21/23 0949  AST 23  ALT 52*  ALKPHOS 122  BILITOT 0.8  PROT 7.5  ALBUMIN 3.4*     Radiology Studies: No results found.    LOS: 3 days    Joycelyn Das, MD [facility] Available via Epic secure chat 7am-7pm After these hours, please refer to coverage provider listed on [email or web address] 01/24/2023, 1:04 PM

## 2023-01-24 NOTE — Inpatient Diabetes Management (Addendum)
Inpatient Diabetes Program Recommendations  AACE/ADA: New Consensus Statement on Inpatient Glycemic Control (2015)  Target Ranges:  Prepandial:   less than 140 mg/dL      Peak postprandial:   less than 180 mg/dL (1-2 hours)      Critically ill patients:  140 - 180 mg/dL   Lab Results  Component Value Date   GLUCAP 307 (H) 01/24/2023   HGBA1C 8.4 (H) 07/16/2022    Latest Reference Range & Units 01/23/23 07:35 01/23/23 11:10 01/23/23 15:50 01/23/23 19:21 01/23/23 23:25 01/24/23 03:44 01/24/23 07:28  Glucose-Capillary 70 - 99 mg/dL 098 (H) Novolog 3 units 228 (H) Novolog 5 units 247 (H) Novolog 5 units 239 (H) 301 (H) Novolog 5 units 370 (H) Novolog 15 units 307 (H) Novolog 11 units  (H): Data is abnormally high  Diabetes history: DM2 Outpatient Diabetes medications: Jardiance 10 mg QD, Glipizide 10 mg QD Current orders for Inpatient glycemic control: Semglee 10 units qd, Novolog 0-15 units tid correction  Pending A1c   Inpatient Diabetes Program Recommendations:   Please consider: -Increase Semglee to 16 units qd (0.2 units/kg x 84.9 kg = 17 units) -Add Novolog 4 units tid meal coverage if eats 50% or> meals -Change Novolog correction to 0-9 units tid, 0-5 units hs  Ordered Living Well With diabetes for review and insulin pen starter kit for daughter review. Patient has been a patient @ [facility] on their memory care side. I spoke via phone with daughter  Fredric Mare and she states staff @ [facility] gives patient all of her medications. TOC checking with facility on preferred insulin to place patient on @ discharge.  Thank you, Billy Fischer. Anamari Galeas, RN, MSN, CDE  Diabetes Coordinator Inpatient Glycemic Control Team Team Pager [phone] (8am-5pm) 01/24/2023 8:44 AM

## 2023-01-25 ENCOUNTER — Encounter: Payer: Self-pay | Admitting: Internal Medicine

## 2023-01-25 ENCOUNTER — Other Ambulatory Visit: Payer: Self-pay

## 2023-01-25 DIAGNOSIS — G9341 Metabolic encephalopathy: Secondary | ICD-10-CM | POA: Diagnosis not present

## 2023-01-25 DIAGNOSIS — N179 Acute kidney failure, unspecified: Secondary | ICD-10-CM | POA: Diagnosis not present

## 2023-01-25 DIAGNOSIS — E870 Hyperosmolality and hypernatremia: Secondary | ICD-10-CM | POA: Diagnosis not present

## 2023-01-25 DIAGNOSIS — R739 Hyperglycemia, unspecified: Secondary | ICD-10-CM | POA: Diagnosis not present

## 2023-01-25 LAB — BASIC METABOLIC PANEL
Anion gap: 9 (ref 5–15)
BUN: 22 mg/dL (ref 8–23)
CO2: 27 mmol/L (ref 22–32)
Calcium: 9.1 mg/dL (ref 8.9–10.3)
Chloride: 110 mmol/L (ref 98–111)
Creatinine, Ser: 1.1 mg/dL — ABNORMAL HIGH (ref 0.44–1.00)
GFR, Estimated: 53 mL/min — ABNORMAL LOW (ref >60)
Glucose, Bld: 121 mg/dL — ABNORMAL HIGH (ref 70–99)
Potassium: 3.4 mmol/L — ABNORMAL LOW (ref 3.5–5.1)
Sodium: 146 mmol/L — ABNORMAL HIGH (ref 135–145)

## 2023-01-25 LAB — GLUCOSE, CAPILLARY
Glucose-Capillary: 95 mg/dL (ref 70–99)
Glucose-Capillary: 97 mg/dL (ref 70–99)
Glucose-Capillary: 159 mg/dL — ABNORMAL HIGH (ref 70–99)
Glucose-Capillary: 106 mg/dL — ABNORMAL HIGH (ref 70–99)

## 2023-01-25 MED ORDER — INSULIN ASPART 100 UNIT/ML IJ SOLN
0.0000 [IU] | Freq: Three times a day (TID) | INTRAMUSCULAR | Status: DC
  Administered 2023-01-25: 2 [IU] via SUBCUTANEOUS
  Filled 2023-01-25: qty 1

## 2023-01-25 MED ORDER — INSULIN ASPART 100 UNIT/ML IJ SOLN: 0.0000 [IU] | Freq: Every day | INTRAMUSCULAR | Status: DC

## 2023-01-25 NOTE — Evaluation (Signed)
Occupational Therapy Evaluation Patient Details Name: Leah Ritter MRN: [id number] DOB: 04-09-1951 Today's Date: 01/25/2023   History of Present Illness 71yo female presented to ER secondary to AMS, lethargy, unable to eat/drink; admitted for management of non-ketotic hyperglycemia   Clinical Impression   Pt was seen for OT evaluation this date. Prior to hospital admission, pt was receiving assist for transfers, bed mobility, bathing, dressing, and all IADL. Pt lives in ALF memory care. Typically uses a rollator for mobility and dtr reports many recent falls. Pt presents to acute OT demonstrating impaired ADL performance and functional mobility 2/2 decreased strength, activity tolerance, balance, and cognition (See OT problem list). Pt currently requires TOTAL +2 for bed mobility, setup and PRN MIN A for self feeding and grooming tasks, and DEP for bed level toileting.  Pt would benefit from skilled OT services to address noted impairments and functional limitations (see below for any additional details) in order to maximize safety and independence while minimizing falls risk and caregiver burden.     Recommendations for follow up therapy are one component of a multi-disciplinary discharge planning process, led by the attending physician.  Recommendations may be updated based on patient status, additional functional criteria and insurance authorization.   Assistance Recommended at Discharge Frequent or constant Supervision/Assistance  Patient can return home with the following Two people to help with walking and/or transfers;Two people to help with bathing/dressing/bathroom;Assist for transportation;Assistance with cooking/housework;Direct supervision/assist for medications management;Direct supervision/assist for financial management;Assistance with feeding;Help with stairs or ramp for entrance    Functional Status Assessment  Patient has had a recent decline in their functional status and  demonstrates the ability to make significant improvements in function in a reasonable and predictable amount of time.  Equipment Recommendations  Other (comment) (defer to next venue)    Recommendations for Other Services       Precautions / Restrictions Precautions Precautions: Fall Restrictions Weight Bearing Restrictions: No      Mobility Bed Mobility Overal bed mobility: Needs Assistance   Rolling: Total assist, +2 for physical assistance         General bed mobility comments: total assist +2 for boosting up in the bed    Transfers                   General transfer comment: unsafe/unable; generally resistive to all movement efforts; anticipate +2 required      Balance Overall balance assessment: History of Falls                                         ADL either performed or assessed with clinical judgement   ADL                                         General ADL Comments: Pt able to self feed a couple french fries provided by daughter. Set up and PRN MIN A for grooming. DEP for UB dressing from bed level, DEP +2 for LB ADL and bed level toileting.     Vision         Perception     Praxis      Pertinent Vitals/Pain Pain Assessment Pain Assessment: No/denies pain     Hand Dominance Right   Extremity/Trunk Assessment Upper Extremity Assessment Upper Extremity Assessment:  Generalized weakness;Difficult to assess due to impaired cognition   Lower Extremity Assessment Lower Extremity Assessment: Generalized weakness;Difficult to assess due to impaired cognition       Communication Communication Communication: Expressive difficulties (generally garbled and incomprehensible)   Cognition Arousal/Alertness: Awake/alert Behavior During Therapy: Flat affect Overall Cognitive Status: History of cognitive impairments - at baseline                                 General Comments: Follows  simple commands intermittently and requiring additional repeated simple cues     General Comments       Exercises Other Exercises Other Exercises: Dep +2 for repositioning in bed for improved comfort and pressure relief, and safety with self feeding   Shoulder Instructions      Home Living Family/patient expects to be discharged to:: Assisted living                             Home Equipment: Rollator (4 wheels)   Additional Comments: Per chart, Lives at [facility]      Prior Functioning/Environment Prior Level of Function : Needs assist             Mobility Comments: Per dtr, pt typically requires some assist for transfers and bed mobility and then is up with a rollator, patient modified independent ambulating with rollator; recent falls (last one last Tuesday--usually found sitting on floor near bed; recent back pain since last fall) ADLs Comments: Per previous documentation (6 months prior) and dtr, pt wears depends; assist for bathing, dressing, medications, meals, and cleaning. Per daughter, often refuses oral care. Dtr notes pt lost her dentures and partial last year.        OT Problem List: Decreased strength;Decreased cognition;Impaired balance (sitting and/or standing);Decreased knowledge of use of DME or AE      OT Treatment/Interventions: Self-care/ADL training;Therapeutic exercise;Therapeutic activities;Cognitive remediation/compensation;DME and/or AE instruction;Patient/family education;Balance training    OT Goals(Current goals can be found in the care plan section) Acute Rehab OT Goals Patient Stated Goal: get better OT Goal Formulation: With family Time For Goal Achievement: 02/08/23 Potential to Achieve Goals: Good ADL Goals Pt Will Perform Eating: with set-up;with supervision;sitting Pt Will Perform Grooming: with set-up;sitting;with supervision Pt Will Transfer to Toilet: bedside commode;stand pivot transfer;with max  assist Pt Will Perform Toileting - Clothing Manipulation and hygiene: with mod assist;sitting/lateral leans  OT Frequency: Min 2X/week    Co-evaluation              AM-PAC OT "6 Clicks" Daily Activity     Outcome Measure Help from another person eating meals?: A Little Help from another person taking care of personal grooming?: A Little Help from another person toileting, which includes using toliet, bedpan, or urinal?: Total Help from another person bathing (including washing, rinsing, drying)?: Total Help from another person to put on and taking off regular upper body clothing?: A Lot Help from another person to put on and taking off regular lower body clothing?: Total 6 Click Score: 11   End of Session Nurse Communication: Other (comment) (daughter requesting some assist for cleaning)  Activity Tolerance: Patient tolerated treatment well Patient left: in bed;with call bell/phone within reach;with bed alarm set;with family/visitor present  OT Visit Diagnosis: Repeated falls (R29.6);Muscle weakness (generalized) (M62.81)  Time: 1610-9604 OT Time Calculation (min): 14 min Charges:  OT General Charges $OT Visit: 1 Visit OT Evaluation $OT Eval Moderate Complexity: 1 Mod  Arman Filter., MPH, MS, OTR/L ascom [phone] 01/25/23, 4:32 PM

## 2023-01-25 NOTE — TOC Initial Note (Signed)
Transition of Care [facility]) - Initial/Assessment Note    Patient Details  Name: Leah Ritter MRN: [id number] Date of Birth: 1951/04/16  Transition of Care [facility]) CM/SW Contact:    Allena Katz, LCSW Phone Number: 01/25/2023, 2:32 PM  Clinical Narrative:     CSW spoke with patients guardian who reports she does not want the pt to go to rehab and would like the pt to return to [place] with [facility]. CSW has left two voicemail's for [place] at [facility] to ensure pt is still able to come back when medically ready but CSW has not heard back from facility at this time.               Expected Discharge Plan: Memory Care (with hospice) Barriers to Discharge: Continued Medical Work up   Patient Goals and CMS Choice     Choice offered to / list presented to : Adult Children      Expected Discharge Plan and Services     Post Acute Care Choice: Resumption of Svcs/PTA Provider Living arrangements for the past 2 months: Assisted Living Facility (Memory Care side)                                      Prior Living Arrangements/Services Living arrangements for the past 2 months: Assisted Living Facility (Memory Care side) Lives with:: Facility Resident Patient language and need for interpreter reviewed:: Yes Do you feel safe going back to the place where you live?: Yes      Need for Family Participation in Patient Care: Yes (Comment) Care giver support system in place?: Yes (comment) Current home services: Hospice Criminal Activity/Legal Involvement Pertinent to Current Situation/Hospitalization: No - Comment as needed  Activities of Daily Living   ADL Screening (condition at time of admission) Patient's cognitive ability adequate to safely complete daily activities?: No Is the patient deaf or have difficulty hearing?: No Does the patient have difficulty seeing, even when wearing glasses/contacts?: No Does the patient have difficulty concentrating,  remembering, or making decisions?: Yes Patient able to express need for assistance with ADLs?: No Does the patient have difficulty dressing or bathing?: Yes Independently performs ADLs?: No Communication: Independent Dressing (OT): Dependent, Needs assistance Is this a change from baseline?: Pre-admission baseline Grooming: Dependent, Needs assistance Is this a change from baseline?: Pre-admission baseline Feeding: Needs assistance Is this a change from baseline?: Pre-admission baseline Bathing: Dependent, Needs assistance Is this a change from baseline?: Pre-admission baseline Toileting: Needs assistance Is this a change from baseline?: Pre-admission baseline In/Out Bed: Needs assistance Is this a change from baseline?: Pre-admission baseline Walks in Home: Needs assistance Is this a change from baseline?: Pre-admission baseline Does the patient have difficulty walking or climbing stairs?: Yes Weakness of Legs: Both Weakness of Arms/Hands: None  Permission Sought/Granted Permission sought to share information with : Facility Medical sales representative, Family Supports, Guardian    Share Information with NAME: Fredric Mare  Permission granted to share info w AGENCY: [employer]  Permission granted to share info w Relationship: Daughter/legal guardian  Permission granted to share info w Contact Information: [phone]  Emotional Assessment Appearance:: Appears stated age Attitude/Demeanor/Rapport: Unable to Assess Affect (typically observed): Unable to Assess Orientation: : Oriented to Self Alcohol / Substance Use: Not Applicable Psych Involvement: No (comment)  Admission diagnosis:  Lower urinary tract infectious disease [N39.0] Hyperglycemia [R73.9] Hyperglycemia without ketosis [R73.9] Altered mental status,  unspecified altered mental status type [R41.82] Patient Active Problem List   Diagnosis Date Noted   Hypernatremia 01/25/2023   Hyperglycemia without ketosis  01/21/2023   Seizure (HCC) 07/16/2022   Sepsis due to gram-negative UTI (HCC) 07/16/2022   Dyslipidemia 07/16/2022   Essential hypertension 07/16/2022   Paroxysmal atrial fibrillation (HCC) 07/16/2022   Dementia with behavioral disturbance (HCC) 07/16/2022   Uncontrolled type 2 diabetes mellitus with hypoglycemia, without long-term current use of insulin (HCC) 07/16/2022   Cystocele with prolapse 12/15/2021   Bacteremia 12/12/2021   UTI (urinary tract infection) 12/11/2021   Abnormal LFTs 12/11/2021   Hyperkalemia 12/10/2021   Anemia 12/10/2021   Acute metabolic encephalopathy 07/06/2021   Hypothermia 07/06/2021   Type II diabetes mellitus with renal manifestations (HCC) 07/06/2021   History of stroke    A-fib (HCC)    Chronic diastolic CHF (congestive heart failure) (HCC)    Depression with anxiety    CKD (chronic kidney disease), stage IIIb    Aspiration pneumonia (HCC)    Nausea & vomiting    AKI (acute kidney injury) (HCC)    Hyperglycemia    SIRS (systemic inflammatory response syndrome) (HCC) 01/09/2019   Acute on chronic systolic CHF (congestive heart failure) (HCC) 01/09/2019   Pain in joint involving ankle and foot 01/03/2018   Localized, primary osteoarthritis of shoulder region 01/03/2018   Rupture of tendon of biceps, long head 01/03/2018   Disorder of bursae of shoulder region 01/03/2018   Screening declined by patient 10/25/2017   Full thickness rotator cuff tear 04/21/2016   Chronic bilateral low back pain without sciatica 02/10/2016   H/O rotator cuff tear 02/10/2016   History of vitamin D deficiency 02/10/2016   Type 2 diabetes mellitus without complication, with long-term current use of insulin (HCC) 02/10/2016   HLD (hyperlipidemia) 11/24/2006   OBESITY, NOS 11/24/2006   POST TRAUMATIC STRESS DISORDER 11/24/2006   DEPRESSIVE DISORDER, NOS 11/24/2006   HYPERTENSION, BENIGN SYSTEMIC 11/24/2006   RHINITIS, ALLERGIC 11/24/2006   GERD (gastroesophageal reflux  disease) 11/24/2006   OSTEOARTHRITIS, MULTI SITES 11/24/2006   Sleep apnea 11/24/2006   PCP:  Almetta Lovely, [facility]:   [facility] [place], [place] - [address] [place] [address] Phone: [phone] Fax: [phone]     Social Determinants of Health (SDOH) Social History: SDOH Screenings   Food Insecurity: No Food Insecurity (07/16/2022)  Housing: Low Risk  (07/16/2022)  Transportation Needs: No Transportation Needs (07/16/2022)  Utilities: Not At Risk (07/16/2022)  Alcohol Screen: Low Risk  (09/05/2018)  Tobacco Use: Low Risk  (01/25/2023)   SDOH Interventions:     Readmission Risk Interventions     No data to display

## 2023-01-25 NOTE — Progress Notes (Signed)
Dr Myriam Forehand made aware that pt uncooperative and combative while lab attempting to draw blood cultures per order, per Dr Myriam Forehand retime and retry lab draw in the morning

## 2023-01-25 NOTE — Progress Notes (Addendum)
Progress Note    Leah Ritter  NWG:[id number] DOB: 05/07/1951  DOA: 01/21/2023 PCP: Almetta Lovely, [facility]      Brief Narrative:    Medical records reviewed and are as summarized below:  Leah Ritter is a 72 y.o. female with medical history significant for multiple medical issues including atrial fibrillation, congestive heart failure, diabetes mellitus type 2, history of seizures, history of CVA presented to the hospital from skilled nursing facility with worsening lethargy and confusion.  Patient also had decreased oral intake and had history of hypoglycemia in the past.  In the ED, patient was noted to have blood glucose level of 622 with sodium of 159 and creatinine of 2.2.  Influenza, COVID and RSV was negative.  Lactate was 1.9.  Urinalysis showed glucose and many bacteria.       Assessment/Plan:   Principal Problem:   Hyperglycemia without ketosis Active Problems:   Acute metabolic encephalopathy   AKI (acute kidney injury) (HCC)   Chronic diastolic CHF (congestive heart failure) (HCC)   Hypernatremia    Body mass index is 27.64 kg/m.   Hyperosmolar hyperglycemic nonketotic state: Glucose levels are improving.  Continue insulin glargine 16 units nightly, NovoLog 4 units 3 times daily.  Use NovoLog as needed for hyperglycemia.  Hemoglobin A1c was 12.6. She has a history of diabetes and was previously on glipizide but this was discontinued in October 2023 when she was hospitalized for hyperglycemia. Her daughter said she was supposed to be getting glipizide as needed but she had not been getting it. and they could not even find her glucometer.    AKI on CKD stage IIIa: Creatinine has improved and she is around her baseline   Hypernatremia: Improving.  Sodium was up to 165 but is down to 146.  Encourage adequate oral intake.   Community-acquired pneumonia: Completed 5 days of IV ceftriaxone and azithromycin on 01/25/2023. Staph epidermidis bacteremia:  Staph epidermidis isolated from blood culture obtained on 01/21/2023 is likely a contaminant.  Repeat blood cultures.   Acute metabolic encephalopathy on underlying dementia: She is still not at baseline according to her daughter.  Continue Seroquel and Remeron.  Continue supportive care   Chronic diastolic CHF: Compensated.  Lasix on hold.   General weakness: PT recommends rehab at SNF.   Paroxysmal atrial fibrillation: Patient is no longer on Xarelto according to [place], daughter.  She said patient is currently enrolled in hospice at the nursing facility, and as part of the hospice program she does not take anticoagulants.   Other comorbidities include hypertension, hyperlipidemia,   Remove Foley catheter.    Diet Order             Diet heart healthy/carb modified Room service appropriate? Yes with Assist; Fluid consistency: Thin  Diet effective now                            Consultants: Intensivist  Procedures: None    Medications:    amLODipine  5 mg Oral Daily   atorvastatin  40 mg Oral QHS   heparin  5,000 Units Subcutaneous Q8H   insulin aspart  0-5 Units Subcutaneous QHS   insulin aspart  0-9 Units Subcutaneous TID WC   insulin aspart  4 Units Subcutaneous TID WC   insulin glargine-yfgn  16 Units Subcutaneous Daily   mirtazapine  15 mg Oral QHS   nystatin  5 mL Oral QID   mouth rinse  15 mL Mouth Rinse 4 times per day   Continuous Infusions:   Anti-infectives (From admission, onward)    Start     Dose/Rate Route Frequency Ordered Stop   01/21/23 0945  cefTRIAXone (ROCEPHIN) 2 g in sodium chloride 0.9 % 100 mL IVPB        2 g 200 mL/hr over 30 Minutes Intravenous Every 24 hours 01/21/23 0941 01/25/23 1031   01/21/23 0945  azithromycin (ZITHROMAX) 500 mg in sodium chloride 0.9 % 250 mL IVPB        500 mg 250 mL/hr over 60 Minutes Intravenous Every 24 hours 01/21/23 0941 01/25/23 0954              Family  Communication/Anticipated D/C date and plan/Code Status   DVT prophylaxis: heparin injection 5,000 Units Start: 01/21/23 1730 SCDs Start: 01/21/23 1719     Code Status: DNR  Family Communication: Plan discussed with [place], daughter, over the phone Disposition Plan: Plan to discharge to SNF in 2 to 3 days   Status is: Inpatient Remains inpatient appropriate because: Metabolic encephalopathy, adjusting insulin for hyperglycemia       Subjective:   Interval events noted.  She is confused and cannot provide any history.  Objective:    Vitals:   01/24/23 2007 01/24/23 2341 01/25/23 0422 01/25/23 0847  BP: 105/63 (!) 142/72 116/80 (!) 141/72  Pulse: 78 84 69 78  Resp: 19 18 18 18   Temp: 98.1 F (36.7 C) 98.1 F (36.7 C) 97.8 F (36.6 C) 97.7 F (36.5 C)  TempSrc: Oral  Oral   SpO2: 100% 99% 100% 99%  Weight:      Height:       No data found.   Intake/Output Summary (Last 24 hours) at 01/25/2023 1214 Last data filed at 01/25/2023 0900 Gross per 24 hour  Intake 240 ml  Output 1450 ml  Net -1210 ml   Filed Weights   01/21/23 1826  Weight: 84.9 kg    Exam:  GEN: NAD, she has mittens on bilateral hands SKIN: No rash EYES: EOMI ENT: MMM CV: RRR PULM: CTA B ABD: soft, ND, NT, +BS CNS: AAO x 1 (person) confused, slurred speech, non focal EXT: No edema or tenderness        Data Reviewed:   I have personally reviewed following labs and imaging studies:  Labs: Labs show the following:   Basic Metabolic Panel: Recent Labs  Lab 01/23/23 1747 01/23/23 2205 01/24/23 0226 01/24/23 0626 01/25/23 0923  NA 145 143 140 143 146*  K 4.2 4.0 3.8 3.7 3.4*  CL 114* 111 109 110 110  CO2 26 24 24 25 27   GLUCOSE 279* 316* 441* 351* 121*  BUN 20 20 22 21 22   CREATININE 1.09* 1.15* 1.18* 1.23* 1.10*  CALCIUM 8.9 8.8* 8.7* 8.9 9.1   GFR Estimated Creatinine Clearance: 53.8 mL/min (A) (by C-G formula based on SCr of 1.1 mg/dL (H)). Liver Function  Tests: Recent Labs  Lab 01/21/23 0949  AST 23  ALT 52*  ALKPHOS 122  BILITOT 0.8  PROT 7.5  ALBUMIN 3.4*   No results for input(s): "LIPASE", "AMYLASE" in the last 168 hours. No results for input(s): "AMMONIA" in the last 168 hours. Coagulation profile Recent Labs  Lab 01/21/23 0949  INR 1.3*    CBC: Recent Labs  Lab 01/21/23 0949 01/22/23 0330 01/23/23 0709 01/24/23 0626  WBC 9.6 9.4 7.3 7.2  NEUTROABS 7.2  --   --   --  HGB 12.9 11.3* 11.4* 12.1  HCT 41.6 36.5 35.9* 37.3  MCV 93.5 95.3 92.5 89.4  PLT 157 107* 101* 99*   Cardiac Enzymes: No results for input(s): "CKTOTAL", "CKMB", "CKMBINDEX", "TROPONINI" in the last 168 hours. BNP (last 3 results) No results for input(s): "PROBNP" in the last 8760 hours. CBG: Recent Labs  Lab 01/24/23 1140 01/24/23 1537 01/24/23 2006 01/25/23 0850 01/25/23 1134  GLUCAP 145* 84 153* 95 97   D-Dimer: No results for input(s): "DDIMER" in the last 72 hours. Hgb A1c: Recent Labs    01/24/23 0619  HGBA1C 12.6*   Lipid Profile: No results for input(s): "CHOL", "HDL", "LDLCALC", "TRIG", "CHOLHDL", "LDLDIRECT" in the last 72 hours. Thyroid function studies: No results for input(s): "TSH", "T4TOTAL", "T3FREE", "THYROIDAB" in the last 72 hours.  Invalid input(s): "FREET3" Anemia work up: No results for input(s): "VITAMINB12", "FOLATE", "FERRITIN", "TIBC", "IRON", "RETICCTPCT" in the last 72 hours. Sepsis Labs: Recent Labs  Lab 01/21/23 0949 01/21/23 1439 01/21/23 1844 01/22/23 0330 01/23/23 0709 01/24/23 0626  PROCALCITON  --  0.10  --   --   --   --   WBC 9.6  --   --  9.4 7.3 7.2  LATICACIDVEN 1.9  --  3.1*  --   --   --     Microbiology Recent Results (from the past 240 hour(s))  Resp panel by RT-PCR (RSV, Flu A&B, Covid) Anterior Nasal Swab     Status: None   Collection Time: 01/21/23  9:49 AM   Specimen: Anterior Nasal Swab  Result Value Ref Range Status   SARS Coronavirus 2 by RT PCR NEGATIVE NEGATIVE  Final    Comment: (NOTE) SARS-CoV-2 target nucleic acids are NOT DETECTED.  The SARS-CoV-2 RNA is generally detectable in upper respiratory specimens during the acute phase of infection. The lowest concentration of SARS-CoV-2 viral copies this assay can detect is 138 copies/mL. A negative result does not preclude SARS-Cov-2 infection and should not be used as the sole basis for treatment or other patient management decisions. A negative result may occur with  improper specimen collection/handling, submission of specimen other than nasopharyngeal swab, presence of viral mutation(s) within the areas targeted by this assay, and inadequate number of viral copies(<138 copies/mL). A negative result must be combined with clinical observations, patient history, and epidemiological information. The expected result is Negative.  Fact Sheet for Patients:  BloggerCourse.com  Fact Sheet for Healthcare Providers:  SeriousBroker.it  This test is no t yet approved or cleared by the [place] FDA and  has been authorized for detection and/or diagnosis of SARS-CoV-2 by FDA under an Emergency Use Authorization (EUA). This EUA will remain  in effect (meaning this test can be used) for the duration of the COVID-19 declaration under Section 564(b)(1) of the Act, 21 U.S.C.section 360bbb-3(b)(1), unless the authorization is terminated  or revoked sooner.       Influenza A by PCR NEGATIVE NEGATIVE Final   Influenza B by PCR NEGATIVE NEGATIVE Final    Comment: (NOTE) The Xpert Xpress SARS-CoV-2/FLU/RSV plus assay is intended as an aid in the diagnosis of influenza from Nasopharyngeal swab specimens and should not be used as a sole basis for treatment. Nasal washings and aspirates are unacceptable for Xpert Xpress SARS-CoV-2/FLU/RSV testing.  Fact Sheet for Patients: BloggerCourse.com  Fact Sheet for Healthcare  Providers: SeriousBroker.it  This test is not yet approved or cleared by the [place] FDA and has been authorized for detection and/or diagnosis of SARS-CoV-2 by FDA  under an Emergency Use Authorization (EUA). This EUA will remain in effect (meaning this test can be used) for the duration of the COVID-19 declaration under Section 564(b)(1) of the Act, 21 U.S.C. section 360bbb-3(b)(1), unless the authorization is terminated or revoked.     Resp Syncytial Virus by PCR NEGATIVE NEGATIVE Final    Comment: (NOTE) Fact Sheet for Patients: BloggerCourse.com  Fact Sheet for Healthcare Providers: SeriousBroker.it  This test is not yet approved or cleared by the [place] FDA and has been authorized for detection and/or diagnosis of SARS-CoV-2 by FDA under an Emergency Use Authorization (EUA). This EUA will remain in effect (meaning this test can be used) for the duration of the COVID-19 declaration under Section 564(b)(1) of the Act, 21 U.S.C. section 360bbb-3(b)(1), unless the authorization is terminated or revoked.  Performed at [facility], [address] [place], [place] [address]   Blood Culture (routine x 2)     Status: None (Preliminary result)   Collection Time: 01/21/23 10:09 AM   Specimen: BLOOD  Result Value Ref Range Status   Specimen Description BLOOD RIGHT [facility]  Final   Special Requests   Final    BOTTLES DRAWN AEROBIC AND ANAEROBIC Blood Culture adequate volume   Culture   Final    NO GROWTH 4 DAYS Performed at [facility], [address], [place], [place] [address]    Report Status PENDING  Incomplete  Blood Culture (routine x 2)     Status: None (Preliminary result)   Collection Time: 01/21/23 10:09 AM   Specimen: BLOOD  Result Value Ref Range Status   Specimen Description   Final    BLOOD LEFT AC Performed at [facility], [address]  [place], [place] [address]    Special Requests   Final    BOTTLES DRAWN AEROBIC AND ANAEROBIC Blood Culture results may not be optimal due to an inadequate volume of blood received in culture bottles Performed at [facility], [address], [place], [place] [address]    Culture  Setup Time   Final    GRAM POSITIVE COCCI IN BOTH AEROBIC AND ANAEROBIC BOTTLES Organism ID to follow CRITICAL RESULT CALLED TO, READ BACK BY AND VERIFIED WITH: JASON ROBBINS @ 0229 01/22/23 LFD  REVIEWED BY A. LAFRANCE    Culture   Final    GRAM POSITIVE COCCI IDENTIFICATION TO FOLLOW STAPHYLOCOCCUS EPIDERMIDIS THE SIGNIFICANCE OF ISOLATING THIS ORGANISM FROM A SINGLE SET OF BLOOD CULTURES WHEN MULTIPLE SETS ARE DRAWN IS UNCERTAIN. PLEASE NOTIFY THE MICROBIOLOGY DEPARTMENT WITHIN ONE WEEK IF SPECIATION AND SENSITIVITIES ARE REQUIRED. Performed at [facility], [address] [place], [place] [address]    Report Status PENDING  Incomplete  Blood Culture ID Panel (Reflexed)     Status: Abnormal   Collection Time: 01/21/23 10:09 AM  Result Value Ref Range Status   Enterococcus faecalis NOT DETECTED NOT DETECTED Final   Enterococcus Faecium NOT DETECTED NOT DETECTED Final   Listeria monocytogenes NOT DETECTED NOT DETECTED Final   Staphylococcus species DETECTED (A) NOT DETECTED Final    Comment: CRITICAL RESULT CALLED TO, READ BACK BY AND VERIFIED WITH: JASON ROBBINS @ 0230 01/22/23 LFD    Staphylococcus aureus (BCID) NOT DETECTED NOT DETECTED Final   Staphylococcus epidermidis NOT DETECTED NOT DETECTED Final   Staphylococcus lugdunensis NOT DETECTED NOT DETECTED Final   Streptococcus species NOT DETECTED NOT DETECTED Final   Streptococcus agalactiae NOT DETECTED NOT DETECTED Final   Streptococcus pneumoniae  NOT DETECTED NOT DETECTED Final   Streptococcus pyogenes NOT DETECTED NOT DETECTED Final   A.calcoaceticus-baumannii NOT DETECTED NOT DETECTED Final   Bacteroides fragilis NOT DETECTED  NOT DETECTED Final   Enterobacterales NOT DETECTED NOT DETECTED Final   Enterobacter cloacae complex NOT DETECTED NOT DETECTED Final   Escherichia coli NOT DETECTED NOT DETECTED Final   Klebsiella aerogenes NOT DETECTED NOT DETECTED Final   Klebsiella oxytoca NOT DETECTED NOT DETECTED Final   Klebsiella pneumoniae NOT DETECTED NOT DETECTED Final   Proteus species NOT DETECTED NOT DETECTED Final   Salmonella species NOT DETECTED NOT DETECTED Final   Serratia marcescens NOT DETECTED NOT DETECTED Final   Haemophilus influenzae NOT DETECTED NOT DETECTED Final   Neisseria meningitidis NOT DETECTED NOT DETECTED Final   Pseudomonas aeruginosa NOT DETECTED NOT DETECTED Final   Stenotrophomonas maltophilia NOT DETECTED NOT DETECTED Final   Candida albicans NOT DETECTED NOT DETECTED Final   Candida auris NOT DETECTED NOT DETECTED Final   Candida glabrata NOT DETECTED NOT DETECTED Final   Candida krusei NOT DETECTED NOT DETECTED Final   Candida parapsilosis NOT DETECTED NOT DETECTED Final   Candida tropicalis NOT DETECTED NOT DETECTED Final   Cryptococcus neoformans/gattii NOT DETECTED NOT DETECTED Final    Comment: Performed at [facility], [address] [place], [place] [address]  MRSA Next Gen by PCR, Nasal     Status: None   Collection Time: 01/21/23  6:27 PM   Specimen: Nasal Mucosa; Nasal Swab  Result Value Ref Range Status   MRSA by PCR Next Gen NOT DETECTED NOT DETECTED Final    Comment: (NOTE) The GeneXpert MRSA Assay (FDA approved for NASAL specimens only), is one component of a comprehensive MRSA colonization surveillance program. It is not intended to diagnose MRSA infection nor to guide or monitor treatment for MRSA infections. Test performance is not FDA approved in patients less than 110 years old. Performed at [facility], [address] [place], [place] [address]     Procedures and diagnostic studies:  No results found.              LOS: 4 days   Kaige Whistler  [facility]   Pager on [email or web address]. If 7PM-7AM, please contact night-coverage at [email or web address]     01/25/2023, 12:14 PM

## 2023-01-25 NOTE — Progress Notes (Signed)
Dr Myriam Forehand also made aware that pt has refused PO nystatin, acknowledged

## 2023-01-26 DIAGNOSIS — R739 Hyperglycemia, unspecified: Secondary | ICD-10-CM | POA: Diagnosis not present

## 2023-01-26 LAB — GLUCOSE, CAPILLARY
Glucose-Capillary: 119 mg/dL — ABNORMAL HIGH (ref 70–99)
Glucose-Capillary: 190 mg/dL — ABNORMAL HIGH (ref 70–99)
Glucose-Capillary: 68 mg/dL — ABNORMAL LOW (ref 70–99)
Glucose-Capillary: 71 mg/dL (ref 70–99)
Glucose-Capillary: 76 mg/dL (ref 70–99)
Glucose-Capillary: 95 mg/dL (ref 70–99)

## 2023-01-26 LAB — CULTURE, BLOOD (ROUTINE X 2)
Culture: NO GROWTH
Special Requests: ADEQUATE

## 2023-01-26 MED ORDER — GLUCERNA SHAKE PO LIQD
237.0000 mL | Freq: Three times a day (TID) | ORAL | Status: DC
  Administered 2023-01-27 – 2023-01-28 (×3): 237 mL via ORAL

## 2023-01-26 MED ORDER — DEXTROSE 50 % IV SOLN: 1.0000 | INTRAVENOUS | Status: DC | PRN

## 2023-01-26 MED ORDER — LORAZEPAM 0.5 MG PO TABS: 0.5000 mg | ORAL_TABLET | ORAL | Status: DC | PRN

## 2023-01-26 MED ORDER — QUETIAPINE FUMARATE 25 MG PO TABS
25.0000 mg | ORAL_TABLET | Freq: Every day | ORAL | Status: DC
  Administered 2023-01-26 – 2023-02-02 (×7): 25 mg via ORAL
  Filled 2023-01-26 (×7): qty 1

## 2023-01-26 MED ORDER — MIRABEGRON ER 25 MG PO TB24
25.0000 mg | ORAL_TABLET | Freq: Every day | ORAL | Status: DC
  Filled 2023-01-26: qty 1

## 2023-01-26 MED ORDER — INSULIN ASPART 100 UNIT/ML IJ SOLN
0.0000 [IU] | INTRAMUSCULAR | Status: DC
  Administered 2023-01-26: 1 [IU] via SUBCUTANEOUS
  Administered 2023-01-27 (×2): 3 [IU] via SUBCUTANEOUS
  Administered 2023-01-28: 2 [IU] via SUBCUTANEOUS
  Administered 2023-01-28: 1 [IU] via SUBCUTANEOUS
  Filled 2023-01-26 (×5): qty 1

## 2023-01-26 MED ORDER — INSULIN GLARGINE-YFGN 100 UNIT/ML ~~LOC~~ SOLN
10.0000 [IU] | Freq: Every day | SUBCUTANEOUS | Status: DC
  Administered 2023-01-26: 10 [IU] via SUBCUTANEOUS
  Filled 2023-01-26: qty 0.1

## 2023-01-26 NOTE — Progress Notes (Signed)
Multiple attempts made to give night time meds. Patient was combative, cursing, ordering staff out of her room and refusing administration. POC glucose was 106 and patient refused any po intake. Made NP aware. Patient pleasant this am. Explained need for checking glucose. Patient agrees until patient is touched by staff.  Patient cursing and ordering staff out of room.

## 2023-01-26 NOTE — Progress Notes (Signed)
Physical Therapy Treatment Patient Details Name: Leah Ritter MRN: [id number] DOB: 06-20-1951 Today's Date: 01/26/2023   History of Present Illness 71yo female presented to ER secondary to AMS, lethargy, unable to eat/drink; admitted for management of non-ketotic hyperglycemia    PT Comments    Pt was long sitting in bed with BUE mitts donned. She is awake but presents slightly lethargic. Overall confused and disoriented to current situation. She was inconsistently able to follow commands however mostly likes to perform task in her own manor. Pt was able to exit bed, stand, and ambulate with RW. Pt ambulated into hallway prior to requesting to use BR. Impulsively turns and ambulated to BR. Successfully urinated and had small BM prior to standing and ambulating back into bed. Mitts reapplied and bed alarm replaced. Pt will need extensive PT to maximize her independence and safety while decreasing caregiver burden.   Recommendations for follow up therapy are one component of a multi-disciplinary discharge planning process, led by the attending physician.  Recommendations may be updated based on patient status, additional functional criteria and insurance authorization.     Assistance Recommended at Discharge Frequent or constant Supervision/Assistance  Patient can return home with the following A lot of help with walking and/or transfers;Two people to help with bathing/dressing/bathroom;Assistance with cooking/housework;Direct supervision/assist for medications management;Direct supervision/assist for financial management;Assist for transportation;Help with stairs or ramp for entrance   Equipment Recommendations  Other (comment) (Defer to next level of care)       Precautions / Restrictions Precautions Precautions: Fall Restrictions Weight Bearing Restrictions: No     Mobility  Bed Mobility Overal bed mobility: Needs Assistance Bed Mobility: Supine to Sit, Sit to Supine  Supine to sit:  Mod assist, Max assist, +2 for safety/equipment, HOB elevated Sit to supine: Mod assist, HOB elevated, +2 for safety/equipment General bed mobility comments: +2 sssistance in room however not required. pt's cognition and poor awareness of current scope of situation, greatly limits session progression    Transfers Overall transfer level: Needs assistance Equipment used: Rolling walker (2 wheels), 1 person hand held assist, None Transfers: Sit to/from Stand Sit to Stand: Min assist  General transfer comment: Pt was able to stand from EOB without AD then with +1 HHA and then to RW. Highly recommend use fo RW at all times in standing. she was able to stand from toilet(without BSC on top) with mod assist fo one    Ambulation/Gait Ambulation/Gait assistance: Min assist, Mod assist Gait Distance (Feet): 30 Feet Assistive device: Rolling walker (2 wheels) Gait Pattern/deviations: Step-through pattern, Trunk flexed, Staggering left, Staggering right, Drifts right/left Gait velocity: decreased  General Gait Details: Pt ambulated ~ 10 ft into hallway prior to requesting to use BR. min assist with gait in straight course however nmod assist during turning. pt extremely high fall risk due to balance deficits + overall cognition    Balance    Poor safety awareness    Cognition Arousal/Alertness: Awake/alert, Lethargic Behavior During Therapy: Impulsive, Restless Overall Cognitive Status: History of cognitive impairments - at baseline      General Comments: Follows simple commands intermittently and requiring additional repeated simple cues               Pertinent Vitals/Pain Pain Assessment Pain Assessment: No/denies pain     PT Goals (current goals can now be found in the care plan section) Acute Rehab PT Goals Patient Stated Goal: none stated Progress towards PT goals: Progressing toward goals    Frequency  Min 3X/week      PT Plan Current plan remains appropriate        AM-PAC PT "6 Clicks" Mobility   Outcome Measure  Help needed turning from your back to your side while in a flat bed without using bedrails?: A Lot Help needed moving from lying on your back to sitting on the side of a flat bed without using bedrails?: A Lot Help needed moving to and from a bed to a chair (including a wheelchair)?: A Little Help needed standing up from a chair using your arms (e.g., wheelchair or bedside chair)?: A Lot Help needed to walk in hospital room?: A Lot Help needed climbing 3-5 steps with a railing? : A Lot 6 Click Score: 13    End of Session Equipment Utilized During Treatment: Gait belt Activity Tolerance: Patient tolerated treatment well;Other (comment) (cognition/awareness limiting) Patient left: in bed;with call bell/phone within reach Nurse Communication: Mobility status PT Visit Diagnosis: Muscle weakness (generalized) (M62.81);Difficulty in walking, not elsewhere classified (R26.2)     Time: 1533-1550 PT Time Calculation (min) (ACUTE ONLY): 17 min  Charges:  $Gait Training: 8-22 mins                     Jetta Lout PTA 01/26/23, 5:00 PM

## 2023-01-26 NOTE — Progress Notes (Addendum)
[facility] Progress Note Patient: Leah Ritter ZOX:[id number] DOB: [birth year] DOA: 01/21/2023  DOS: the patient was seen and examined on 01/26/2023  Brief hospital course: PMH of type II DM, seizures, CVA, chronic diastolic CHF, OSA, CKD 3a, HTN, HLD, chronic A-fib presented to hospital with complaints of poor p.o. intake and lethargy.  Workup in the ED showed blood sugars of 622 with hypothermia and hypernatremia concerning for hyperglycemic nonketotic hyperosmolar state.  Admitted to the ICU. Treated with IV fluids and IV insulin therapy.  Transfer to hospital service after completion of the IV insulin protocol. Currently remaining encephalopathy with poor p.o. intake with frequent hypoglycemic spells. Assessment and Plan: Hyperosmolar hyperglycemic nonketotic state. Blood sugars was severely elevated with hyponatremia at the time of admission. Treated with IV insulin and IV fluid. Currently on basal bolus regimen as well as Premeal coverage. Due to hypoglycemic events Lantus dose was reduced to 10 units only and Premeal coverage was discontinued.  She continues to have poor p.o. intake as well as ongoing hypoglycemia therefore we will discontinue the long-acting insulin and only use as needed sliding scale.  Changing from ACHS to every 4 hours as the patient has poor p.o. intake.  Also add D50 as needed.  May require dextrose drip if blood sugars remain low. Add Glucerna as well. Monitor for now.  Acute kidney injury on CKD stage IIIa Baseline GFR around 50s serum creatinine 1-1.1. On admission serum creatinine 2.23. Treated with IV hydration. Will monitor.  Hypokalemia. Hypernatremia. Sodium potassium corrected earlier.  At peak sodium was 165. Patient is not allowing blood work in the last 24 hours. Will continue to monitor and encourage patient to allow blood work.  Community-acquired pneumonia. Patient completed 5-day antibiotic therapy.  Currently no evidence of acute  infection.  Staph epi bacteria and blood. Contamination. Repeat cultures ordered. Will monitor.  Acute delirium secondary to metabolic encephalopathy in the setting of underlying dementia. Still not clear and still not back to baseline. Will resume Seroquel tonight. Will hold Remeron for now. Monitor for improvement in mentation.  Chronic diastolic CHF. Appears to be euvolemic. Diuretics on hold.  History of OSA. Not on any CPAP therapy.  Paroxysmal A-fib. Not on any anticoagulation right now. Will monitor.  HTN. Blood pressure stable. Holding medications.  HLD. Due to poor p.o. intake currently holding medication.  Goals of care conversation. From the chart review it appears that the patient is active with [facility] at may have been reach ALF memory care unit. At baseline patient is able to walk but mostly walk with assistance and speaks some. At the time of my evaluation patient is not eating or drinking anything and is minimally verbal and answering questions only with 'Hmmm' sound despite able to speak, she had incoherent speech briefly. I suspect her prognosis remains poor. Unable to reach the daughter on 5/1.  Will continue current care and discuss with daughter with regards to her goals of care and likely recommend transitioning to comfort and hospice.    Subjective: Incoherent speech.  No nausea no vomiting.  Minimal oral intake.  Physical Exam: General: in Mild distress, No Rash Cardiovascular: S1 and S2 Present, No Murmur Respiratory: Good respiratory effort, Bilateral Air entry present. No Crackles, No wheezes Abdomen: Bowel Sound present, No tenderness Extremities: No edema Neuro: Drowsy but easily awakened, incoherent speech, unable to follow commands, withdraws to painful stimuli, rest of the examination limited due to patient's cooperation  Data Reviewed: I have Reviewed nursing notes, Vitals, and  Lab results. Reviewed CBG.  Patient refused rest  of the blood work. Disposition: Status is: Inpatient Remains inpatient appropriate because: Need further clarity on goals of care.  Remains hypoglycemic and presented with hyperglycemia  heparin injection 5,000 Units Start: 01/21/23 1730 SCDs Start: 01/21/23 1719   Family Communication: Unable to reach daughter on phone.  Left voicemail x 2. Level of care: Med-Surg   Vitals:   01/25/23 2151 01/26/23 0735 01/26/23 1117 01/26/23 1556  BP: (!) 84/31 (!) 124/52 116/62 125/86  Pulse: 79 76 70 98  Resp: 18 18 18 18   Temp: 98.2 F (36.8 C) 98.1 F (36.7 C) 97.7 F (36.5 C) 97.9 F (36.6 C)  TempSrc: Oral Oral Oral   SpO2: 100% 100% 100% 100%  Weight:      Height:         Author: Lynden Oxford, MD 01/26/2023 4:44 PM  Please look on [email or web address] to find out who is on call.

## 2023-01-27 DIAGNOSIS — R739 Hyperglycemia, unspecified: Secondary | ICD-10-CM

## 2023-01-27 LAB — CBC
HCT: 37 % (ref 36.0–46.0)
Hemoglobin: 12 g/dL (ref 12.0–15.0)
MCH: 29.1 pg (ref 26.0–34.0)
MCHC: 32.4 g/dL (ref 30.0–36.0)
MCV: 89.6 fL (ref 80.0–100.0)
Platelets: 147 10*3/uL — ABNORMAL LOW (ref 150–400)
RBC: 4.13 MIL/uL (ref 3.87–5.11)
RDW: 13.3 % (ref 11.5–15.5)
WBC: 6.4 10*3/uL (ref 4.0–10.5)
nRBC: 0 % (ref 0.0–0.2)

## 2023-01-27 LAB — BASIC METABOLIC PANEL
Anion gap: 8 (ref 5–15)
BUN: 24 mg/dL — ABNORMAL HIGH (ref 8–23)
CO2: 24 mmol/L (ref 22–32)
Calcium: 9.1 mg/dL (ref 8.9–10.3)
Chloride: 112 mmol/L — ABNORMAL HIGH (ref 98–111)
Creatinine, Ser: 1.03 mg/dL — ABNORMAL HIGH (ref 0.44–1.00)
GFR, Estimated: 58 mL/min — ABNORMAL LOW (ref >60)
Glucose, Bld: 116 mg/dL — ABNORMAL HIGH (ref 70–99)
Potassium: 3.9 mmol/L (ref 3.5–5.1)
Sodium: 144 mmol/L (ref 135–145)

## 2023-01-27 LAB — GLUCOSE, CAPILLARY
Glucose-Capillary: 89 mg/dL (ref 70–99)
Glucose-Capillary: 97 mg/dL (ref 70–99)
Glucose-Capillary: 271 mg/dL — ABNORMAL HIGH (ref 70–99)
Glucose-Capillary: 297 mg/dL — ABNORMAL HIGH (ref 70–99)

## 2023-01-27 LAB — MAGNESIUM: Magnesium: 2.2 mg/dL (ref 1.7–2.4)

## 2023-01-27 MED ORDER — MIRTAZAPINE 15 MG PO TABS
15.0000 mg | ORAL_TABLET | Freq: Every day | ORAL | Status: DC
  Administered 2023-01-28 – 2023-02-02 (×6): 15 mg via ORAL
  Filled 2023-01-27 (×6): qty 1

## 2023-01-27 NOTE — TOC Progression Note (Addendum)
Transition of Care [facility]) - Progression Note    Patient Details  Name: Leah Ritter MRN: [id number] Date of Birth: 03/14/1951  [facility] CM/SW Contact  Allena Katz, LCSW Phone Number: 01/27/2023, 10:27 AM  Clinical Narrative:   FL2 faxed to [facility] per the request of Pam in admissions. Pam reports her nurse will have to come assess patient before pt can return. Pam to call CSW back with a date of when this will occur.    5/2 3:56pm  LVM with Pam at [facility] to see when they are able to assess patient.   Expected Discharge Plan: Memory Care (with hospice) Barriers to Discharge: Continued Medical Work up  Expected Discharge Plan and Services     Post Acute Care Choice: Resumption of Svcs/PTA Provider Living arrangements for the past 2 months: Assisted Living Facility (Memory Care side)                                       Social Determinants of Health (SDOH) Interventions SDOH Screenings   Food Insecurity: No Food Insecurity (07/16/2022)  Housing: Low Risk  (07/16/2022)  Transportation Needs: No Transportation Needs (07/16/2022)  Utilities: Not At Risk (07/16/2022)  Alcohol Screen: Low Risk  (09/05/2018)  Tobacco Use: Low Risk  (01/25/2023)    Readmission Risk Interventions     No data to display

## 2023-01-27 NOTE — Progress Notes (Signed)
Occupational Therapy Treatment Patient Details Name: Leah Ritter MRN: [id number] DOB: [birth year] Today's Date: 01/27/2023   History of present illness 71yo female presented to ER secondary to AMS, lethargy, unable to eat/drink; admitted for management of non-ketotic hyperglycemia   OT comments  Pt seen for OT tx this date. Pt alert, pleasant, and agreeable to session. Noted breakfast tray had not been touched yet. When asked, pt expressed strong interest in eating. With pt in chair position in bed, OT was required to cut and prepare food. Pt able to verbalize preferences for condiments with cues. Once prepared, pt was able to hold coffee mug to sip without spilling. She had an easier time using spoon to scoop bites of pancake and crumbled sausage to bring to mouth than a fork. Pt poured milk into her oatmeal to prepare it to her liking after milk carton was opened. PRN VC and tactile cues for safety to take small bites. Tends to hold coffee mug just under her mouth, OT noting a small piece of pancake fell off the spoon into the coffee but pt appears not to notice. PRN MIN A + VC for adjusting spoonful to smaller portion to minimize risk of aspiration/choking. Pt has tendency to intermittently perseverate with scooping food, leading to over full spoon or food being pushed off the plate. Pt able to pick up OJ cup to take a sip without spillage. Pt requires supv and intermittent assist for feeding to ensure safety. Pt able to wipe her mouth appropriately. Pt continues to benefit from skilled OT services to maximize safety/indep and minimize caregiver burden.    Recommendations for follow up therapy are one component of a multi-disciplinary discharge planning process, led by the attending physician.  Recommendations may be updated based on patient status, additional functional criteria and insurance authorization.    Assistance Recommended at Discharge Frequent or constant Supervision/Assistance  Patient  can return home with the following  Two people to help with walking and/or transfers;Two people to help with bathing/dressing/bathroom;Assist for transportation;Assistance with cooking/housework;Direct supervision/assist for medications management;Direct supervision/assist for financial management;Assistance with feeding;Help with stairs or ramp for entrance   Equipment Recommendations  Other (comment) (defer to next venue of care)    Recommendations for Other Services      Precautions / Restrictions Precautions Precautions: Fall Restrictions Weight Bearing Restrictions: No       Mobility Bed Mobility                    Transfers                         Balance                                           ADL either performed or assessed with clinical judgement   ADL Overall ADL's : Needs assistance/impaired Eating/Feeding: Set up;Cueing for safety;Cueing for compensatory techinques Eating/Feeding Details (indicate cue type and reason): With pt in chair position in bed, OT was required to cut and prepare food. Once prepared, pt was able to hold coffee mug to sip without spilling. She had an easier time using spoon to scoop bites of pancake and crumbled sausage to bring to mouth than a fork. Pt poured milk into her oatmeal to prepare it to her liking after milk carton was opened. PRN VC for safety to take  small bites. Tends to hold coffee mug just under her mouth, OT noting a small piece of pancake fell off the spoon into the coffee but pt appears not to notice. PRN MIN A + VC for adjusting spoonful to smaller portion to minimize risk of aspiration/choking. Pt has tendency to intermittently perseverate with scooping food, leading to over full spoon or food being pushed off the plate. Pt able to pick up OJ cup to take a sip without spillage. Pt requires supv and intermittent assist for feeding to ensure safety. Pt able to wipe her mouth appropriately.                                         Extremity/Trunk Assessment              Vision       Perception     Praxis      Cognition Arousal/Alertness: Awake/alert Behavior During Therapy: WFL for tasks assessed/performed Overall Cognitive Status: History of cognitive impairments - at baseline                                 General Comments: cues for following simple commands intermittently        Exercises      Shoulder Instructions       General Comments      Pertinent Vitals/ Pain       Pain Assessment Pain Assessment: Faces Faces Pain Scale: No hurt  Home Living                                          Prior Functioning/Environment              Frequency  Min 2X/week        Progress Toward Goals  OT Goals(current goals can now be found in the care plan section)  Progress towards OT goals: Progressing toward goals  Acute Rehab OT Goals Patient Stated Goal: get better OT Goal Formulation: With family Time For Goal Achievement: 02/08/23 Potential to Achieve Goals: Good  Plan Discharge plan remains appropriate;Frequency remains appropriate    Co-evaluation                 AM-PAC OT "6 Clicks" Daily Activity     Outcome Measure   Help from another person eating meals?: A Little Help from another person taking care of personal grooming?: A Little Help from another person toileting, which includes using toliet, bedpan, or urinal?: Total Help from another person bathing (including washing, rinsing, drying)?: Total Help from another person to put on and taking off regular upper body clothing?: A Lot Help from another person to put on and taking off regular lower body clothing?: Total 6 Click Score: 11    End of Session    OT Visit Diagnosis: Repeated falls (R29.6);Muscle weakness (generalized) (M62.81)   Activity Tolerance Patient tolerated treatment well   Patient Left in bed;with  call bell/phone within reach;with bed alarm set   Nurse Communication Other (comment) (pt requires assist for all meals)        Time: 5621-3086 OT Time Calculation (min): 38 min  Charges: OT General Charges $OT Visit: 1 Visit OT Treatments $Self Care/Home Management : 38-52  mins  Arman Filter., MPH, MS, OTR/L ascom [phone] 01/27/23, 1:21 PM

## 2023-01-27 NOTE — [place] FL2 (Signed)
[place] MEDICAID FL2 LEVEL OF CARE FORM     IDENTIFICATION  Patient Name: Leah Ritter Birthdate: Feb 27, 1951 Sex: female Admission Date (Current Location): 01/21/2023  [facility] and [place] Number:  Chiropodist and Address:         Provider Number: [id number]  Attending Physician Name and Address:  Rolly Salter, MD  Relative Name and Phone Number:  Fredric Mare (Legal Guardian) (Daughter) [phone] (    Current Level of Care: Hospital Recommended Level of Care: Assisted Living Facility Prior Approval Number:    Date Approved/Denied:   PASRR Number:    Discharge Plan: Domiciliary (Rest home) With Hospice   Current Diagnoses: Patient Active Problem List   Diagnosis Date Noted   Hypernatremia 01/25/2023   Hyperglycemia without ketosis 01/21/2023   Seizure (HCC) 07/16/2022   Sepsis due to gram-negative UTI (HCC) 07/16/2022   Dyslipidemia 07/16/2022   Essential hypertension 07/16/2022   Paroxysmal atrial fibrillation (HCC) 07/16/2022   Dementia with behavioral disturbance (HCC) 07/16/2022   Uncontrolled type 2 diabetes mellitus with hypoglycemia, without long-term current use of insulin (HCC) 07/16/2022   Cystocele with prolapse 12/15/2021   Bacteremia 12/12/2021   UTI (urinary tract infection) 12/11/2021   Abnormal LFTs 12/11/2021   Hyperkalemia 12/10/2021   Anemia 12/10/2021   Acute metabolic encephalopathy 07/06/2021   Hypothermia 07/06/2021   Type II diabetes mellitus with renal manifestations (HCC) 07/06/2021   History of stroke    A-fib (HCC)    Chronic diastolic CHF (congestive heart failure) (HCC)    Depression with anxiety    CKD (chronic kidney disease), stage IIIb    Aspiration pneumonia (HCC)    Nausea & vomiting    AKI (acute kidney injury) (HCC)    Hyperglycemia    SIRS (systemic inflammatory response syndrome) (HCC) 01/09/2019   Acute on chronic systolic CHF (congestive heart failure) (HCC) 01/09/2019   Pain in joint  involving ankle and foot 01/03/2018   Localized, primary osteoarthritis of shoulder region 01/03/2018   Rupture of tendon of biceps, long head 01/03/2018   Disorder of bursae of shoulder region 01/03/2018   Screening declined by patient 10/25/2017   Full thickness rotator cuff tear 04/21/2016   Chronic bilateral low back pain without sciatica 02/10/2016   H/O rotator cuff tear 02/10/2016   History of vitamin D deficiency 02/10/2016   Type 2 diabetes mellitus without complication, with long-term current use of insulin (HCC) 02/10/2016   HLD (hyperlipidemia) 11/24/2006   OBESITY, NOS 11/24/2006   POST TRAUMATIC STRESS DISORDER 11/24/2006   DEPRESSIVE DISORDER, NOS 11/24/2006   HYPERTENSION, BENIGN SYSTEMIC 11/24/2006   RHINITIS, ALLERGIC 11/24/2006   GERD (gastroesophageal reflux disease) 11/24/2006   OSTEOARTHRITIS, MULTI SITES 11/24/2006   Sleep apnea 11/24/2006    Orientation RESPIRATION BLADDER Height & Weight     Self    Incontinent Weight: 187 lb 2.7 oz (84.9 kg) Height:  5\' 9"  (175.3 cm)  BEHAVIORAL SYMPTOMS/MOOD NEUROLOGICAL BOWEL NUTRITION STATUS      Incontinent Diet (See DC summary)  AMBULATORY STATUS COMMUNICATION OF NEEDS Skin   Extensive Assist Verbally                         Personal Care Assistance Level of Assistance  Feeding, Bathing, Dressing Bathing Assistance: Maximum assistance Feeding assistance: Maximum assistance Dressing Assistance: Maximum assistance     Functional Limitations Info             SPECIAL CARE FACTORS FREQUENCY  Contractures Contractures Info: Not present    Additional Factors Info  Code Status, Allergies Code Status Info: DNR Allergies Info: Oxycodone-acetaminophen  Ezetimibe  Lisinopril  Statins           Current Medications (01/27/2023):  This is the current hospital active medication list Current Facility-Administered Medications  Medication Dose Route Frequency Provider Last Rate  Last Admin   dextrose 50 % solution 50 mL  1 ampule Intravenous PRN Rolly Salter, MD       feeding supplement (GLUCERNA SHAKE) (GLUCERNA SHAKE) liquid 237 mL  237 mL Oral TID BM Rolly Salter, MD       heparin injection 5,000 Units  5,000 Units Subcutaneous Q8H Earley Brooke, MD   5,000 Units at 01/27/23 0555   insulin aspart (novoLOG) injection 0-6 Units  0-6 Units Subcutaneous Q4H Rolly Salter, MD   1 Units at 01/26/23 2033   LORazepam (ATIVAN) tablet 0.5 mg  0.5 mg Oral Q4H PRN Rolly Salter, MD       nystatin (MYCOSTATIN) 100000 UNIT/ML suspension 500,000 Units  5 mL Oral QID Earley Brooke, MD   500,000 Units at 01/25/23 1609   Oral care mouth rinse  15 mL Mouth Rinse 4 times per day Pokhrel, Rebekah [place], MD   15 mL at 01/25/23 1610   Oral care mouth rinse  15 mL Mouth Rinse PRN Pokhrel, Laxman, MD       QUEtiapine (SEROQUEL) tablet 25 mg  25 mg Oral QHS Rolly Salter, MD   25 mg at 01/26/23 2155     Discharge Medications: Please see discharge summary for a list of discharge medications.  Relevant Imaging Results:  Relevant Lab Results:   Additional Information SS [id number]  Bryanne Riquelme, LCSW

## 2023-01-27 NOTE — Progress Notes (Signed)
[facility] Progress Note Patient: Leah [facility] ZOX:[id number] DOB: [birth year] DOA: 01/21/2023  DOS: the patient was seen and examined on 01/27/2023  Brief hospital course: PMH of type II DM, seizures, CVA, chronic diastolic CHF, OSA, CKD 3a, HTN, HLD, chronic A-fib presented to hospital with complaints of poor p.o. intake and lethargy.  Workup in the ED showed blood sugars of 622 with hypothermia and hypernatremia concerning for hyperglycemic nonketotic hyperosmolar state.  Admitted to the ICU. Treated with IV fluids and IV insulin therapy.  Transfer to hospital service after completion of the IV insulin protocol. Currently remaining encephalopathy with poor p.o. intake with frequent hypoglycemic spells. Assessment and Plan: Hyperosmolar hyperglycemic nonketotic state. Blood sugars was severely elevated with hyponatremia at the time of admission. Treated with IV insulin and IV fluid. Currently on basal bolus regimen as well as Premeal coverage. Due to hypoglycemic events Lantus dose was reduced to 10 units only and Premeal coverage was discontinued.  She continues to have poor p.o. intake as well as ongoing hypoglycemia therefore currently only on sliding scale insulin. Continue every 4 hour coverage.   Add D50 as needed.   Add Glucerna as well. Monitor for now.  Acute kidney injury on CKD stage IIIa Baseline GFR around 50s serum creatinine 1-1.1. On admission serum creatinine 2.23. Treated with IV hydration. Will monitor.  Hypokalemia. Hypernatremia. Sodium potassium corrected earlier.  At peak sodium was 165. Patient is not allowing blood work in the last 24 hours. Will continue to monitor and encourage patient to allow blood work.  Community-acquired pneumonia. Patient completed 5-day antibiotic therapy.  Currently no evidence of acute infection.  Staph epi bacteria and blood. Contamination. Repeat cultures so far negative. Will monitor.  Acute delirium secondary to  metabolic encephalopathy in the setting of underlying dementia. Still not clear and still not back to baseline. Will continue Seroquel. Will resume Remeron for now. Monitor for improvement in mentation.  Chronic diastolic CHF. Appears to be euvolemic. Diuretics on hold.  History of OSA. Not on any CPAP therapy.  Paroxysmal A-fib. Not on any anticoagulation right now. Will monitor.  HTN. Blood pressure stable. Holding medications.  HLD. Due to poor p.o. intake currently holding medication.  Goals of care conversation. From the chart review it appears that the patient is active with [facility] at may have been reach ALF memory care unit. At baseline patient is able to walk but mostly walk with assistance and speaks some. Patient continues to exhibit poor p.o. intake. Discussed with daughter on 5/2.  Patient is on hospice at the facility for support and assistance rather than for end-of-life care.  She signs up paper to enroll in hospice on her return back to the facility.  Daughter currently prefers patient's medical conditions to be treated. Daughter understands that the patient's condition can be recurrent if her nutritional intake is inconsistent. Patient did eat good on 5/1 at dinnertime but not much intake today.  Speech is still incoherent.  I still suspect that the patient's prognosis will remain guarded going forward. Discussed that I will be observing her oral intake today and will have further conversation with regards to nutritional needs tomorrow on 5/3. Also informed her that I will be consulting palliative care for further clarity on goals of care. Met with daughter at bedside later in the evening again to discuss this and decide on the goal of care pathway for the patient.  Answered questions about possible future trajectory for the patient.  Subjective: She answered and  greeted me with good morning this morning but after that her speech was incoherent.  Able to  follow occasional commands.  Denied any acute pain.  Answer questions in yes or no.  Physical Exam: General: in Mild distress, No Rash, oral mucosa dry Cardiovascular: S1 and S2 Present, No Murmur Respiratory: Good respiratory effort, Bilateral Air entry present. No Crackles, No wheezes Abdomen: Bowel Sound present, No tenderness Extremities: Trace edema Neuro: Drowsy but easily awakened and not oriented x3, no new focal deficit   Data Reviewed: I have Reviewed nursing notes, Vitals, and Lab results. Reviewed CBG.  Patient refused rest of the blood work. Disposition: Status is: Inpatient Remains inpatient appropriate because: Need further clarity on goals of care.  Remains hypoglycemic and presented with hyperglycemia  heparin injection 5,000 Units Start: 01/21/23 1730 SCDs Start: 01/21/23 1719   Family Communication: Discussed with daughter at the bedside. Level of care: Med-Surg   Vitals:   01/26/23 1939 01/26/23 2025 01/27/23 0411 01/27/23 0837  BP: (!) 89/53 116/67 121/85 138/80  Pulse: 92 86 75 77  Resp: 19  18 18   Temp: 98 F (36.7 C)  98.6 F (37 C) 97.9 F (36.6 C)  TempSrc:      SpO2: 100%  100%   Weight:      Height:         Author: Lynden Oxford, MD 01/27/2023 4:14 PM  Please look on [email or web address] to find out who is on call.

## 2023-01-28 DIAGNOSIS — N179 Acute kidney failure, unspecified: Secondary | ICD-10-CM

## 2023-01-28 DIAGNOSIS — R739 Hyperglycemia, unspecified: Secondary | ICD-10-CM | POA: Diagnosis not present

## 2023-01-28 DIAGNOSIS — Z515 Encounter for palliative care: Secondary | ICD-10-CM

## 2023-01-28 DIAGNOSIS — R4182 Altered mental status, unspecified: Secondary | ICD-10-CM

## 2023-01-28 DIAGNOSIS — Z7189 Other specified counseling: Secondary | ICD-10-CM

## 2023-01-28 LAB — CULTURE, BLOOD (ROUTINE X 2)

## 2023-01-28 LAB — CBC
HCT: 37.1 % (ref 36.0–46.0)
Hemoglobin: 11.8 g/dL — ABNORMAL LOW (ref 12.0–15.0)
MCH: 28.7 pg (ref 26.0–34.0)
MCHC: 31.8 g/dL (ref 30.0–36.0)
MCV: 90.3 fL (ref 80.0–100.0)
Platelets: 175 10*3/uL (ref 150–400)
RBC: 4.11 MIL/uL (ref 3.87–5.11)
RDW: 13.4 % (ref 11.5–15.5)
WBC: 6.9 10*3/uL (ref 4.0–10.5)
nRBC: 0 % (ref 0.0–0.2)

## 2023-01-28 LAB — GLUCOSE, CAPILLARY
Glucose-Capillary: 103 mg/dL — ABNORMAL HIGH (ref 70–99)
Glucose-Capillary: 116 mg/dL — ABNORMAL HIGH (ref 70–99)
Glucose-Capillary: 123 mg/dL — ABNORMAL HIGH (ref 70–99)
Glucose-Capillary: 162 mg/dL — ABNORMAL HIGH (ref 70–99)
Glucose-Capillary: 183 mg/dL — ABNORMAL HIGH (ref 70–99)
Glucose-Capillary: 239 mg/dL — ABNORMAL HIGH (ref 70–99)

## 2023-01-28 LAB — BASIC METABOLIC PANEL
Anion gap: 7 (ref 5–15)
BUN: 22 mg/dL (ref 8–23)
CO2: 27 mmol/L (ref 22–32)
Calcium: 8.8 mg/dL — ABNORMAL LOW (ref 8.9–10.3)
Chloride: 108 mmol/L (ref 98–111)
Creatinine, Ser: 1.09 mg/dL — ABNORMAL HIGH (ref 0.44–1.00)
GFR, Estimated: 54 mL/min — ABNORMAL LOW (ref >60)
Glucose, Bld: 160 mg/dL — ABNORMAL HIGH (ref 70–99)
Potassium: 3.5 mmol/L (ref 3.5–5.1)
Sodium: 142 mmol/L (ref 135–145)

## 2023-01-28 LAB — MAGNESIUM: Magnesium: 2.1 mg/dL (ref 1.7–2.4)

## 2023-01-28 MED ORDER — INSULIN ASPART 100 UNIT/ML IJ SOLN: 0.0000 [IU] | Freq: Every day | INTRAMUSCULAR | Status: DC

## 2023-01-28 MED ORDER — MIRABEGRON ER 25 MG PO TB24
25.0000 mg | ORAL_TABLET | Freq: Every day | ORAL | Status: DC
  Administered 2023-01-28 – 2023-02-03 (×7): 25 mg via ORAL
  Filled 2023-01-28 (×7): qty 1

## 2023-01-28 MED ORDER — INSULIN ASPART 100 UNIT/ML IJ SOLN
0.0000 [IU] | Freq: Three times a day (TID) | INTRAMUSCULAR | Status: DC
  Administered 2023-01-28: 3 [IU] via SUBCUTANEOUS
  Administered 2023-01-28: 2 [IU] via SUBCUTANEOUS
  Administered 2023-01-29: 11 [IU] via SUBCUTANEOUS
  Administered 2023-01-29: 2 [IU] via SUBCUTANEOUS
  Administered 2023-01-30: 5 [IU] via SUBCUTANEOUS
  Administered 2023-01-30: 3 [IU] via SUBCUTANEOUS
  Administered 2023-01-30: 8 [IU] via SUBCUTANEOUS
  Administered 2023-01-31: 15 [IU] via SUBCUTANEOUS
  Administered 2023-01-31: 8 [IU] via SUBCUTANEOUS
  Administered 2023-01-31: 2 [IU] via SUBCUTANEOUS
  Administered 2023-02-01: 3 [IU] via SUBCUTANEOUS
  Administered 2023-02-01: 8 [IU] via SUBCUTANEOUS
  Administered 2023-02-02: 1 [IU] via SUBCUTANEOUS
  Administered 2023-02-02: 2 [IU] via SUBCUTANEOUS
  Administered 2023-02-02: 11 [IU] via SUBCUTANEOUS
  Filled 2023-01-28 (×13): qty 1

## 2023-01-28 MED ORDER — ADULT MULTIVITAMIN W/MINERALS CH
1.0000 | ORAL_TABLET | Freq: Every day | ORAL | Status: DC
  Administered 2023-01-28 – 2023-02-03 (×7): 1 via ORAL
  Filled 2023-01-28 (×7): qty 1

## 2023-01-28 MED ORDER — ENSURE ENLIVE PO LIQD
237.0000 mL | Freq: Three times a day (TID) | ORAL | Status: DC
  Administered 2023-01-28 – 2023-02-03 (×16): 237 mL via ORAL

## 2023-01-28 NOTE — Progress Notes (Signed)
PT Cancellation Note  Patient Details Name: Leah Ritter MRN: [id number] DOB: 1951-06-13   Cancelled Treatment:     PT attempt.Pt's supportive grandson at bedside.  Max encouragement to participate but pt very resistive. Unwilling to participate at this time. PT will continue to follow and progress as able per current POC.    [name] 01/28/2023, 11:31 AM

## 2023-01-28 NOTE — Progress Notes (Addendum)
Initial Nutrition Assessment  DOCUMENTATION CODES:   Not applicable  INTERVENTION:   -D/c Glucerna -Ensure Enlive po TID, each supplement provides 350 kcal and 20 grams of protein.  -MVI with minerals daily -Magic cup TID with meals, each supplement provides 290 kcal and 9 grams of protein  -Feeding assistance with meals -48 hour calorie count per MD -Continue with regular diet for widest variety of meal selections  NUTRITION DIAGNOSIS:   Inadequate oral intake related to poor appetite as evidenced by per patient/family report.  GOAL:   Patient will meet greater than or equal to 90% of their needs  MONITOR:   PO intake, Supplement acceptance  REASON FOR ASSESSMENT:   Consult, Low Braden Assessment of nutrition requirement/status, Calorie Count  ASSESSMENT:   Pt with past medical history of multiple medical issues including afib, CHF, diabetes, history of seizures, hx/o CVA presenting w/ hypothermia, HHS, hypernatremia, encephalopathy.  Pt admitted with AMS, HHS, and hyperglycemia.   4/27- s/p BSE- regular diet with thin liquids  Reviewed I/O's: +500 ml x 24 hours and -3.1 L since admission  Pt is a resident of [facility]. She is followed by hospice.   Pt sitting up in bed, speaking nonsensical language at time of visit. She greeted RD and stated she was feeling well, however, otherwise did not interact with this RD. No family at bedside to provide additional history.   Case discussed with SLP. Pt would benefit from softer textured foods, but remains on regular diet for widest variety of meal selections. Meal completions 0-40%. Pt requires feeding assistance. Noted empty Glucerna carton on table.   Reviewed wt hx; pt has experienced a 11.6% wt loss over the past 6 months, which is significant for time frame. Pt does not meet criteria for malnutrition at this time.   MD requesting calorie count. He expressed concern of pt's poor oral intake and concern for  hypoglycemia.   5/2-5/3 Breakfast: 0% completed Lunch: 434 kcals, 24 grams protein Dinner: 85 kcals, 3 grams protein   Total intake: 519 kcal (32% of minimum estimated needs)  27 grams protein (32% of minimum estimated needs)  Pt with poor oral intake and would benefit from nutrient dense supplement. One Ensure Enlive supplement provides 350 kcals, 20 grams protein, and 44-45 grams of carbohydrate vs one Glucerna shake supplement, which provides 220 kcals, 10 grams of protein, and 26 grams of carbohydrate. Given pt's hx of DM, RD will reassess adequacy of PO intake, CBGS, and adjust supplement regimen as appropriate at follow-up.    Palliative care consult pending for goals of care. Per TOC notes, pt daughter is considering hospice facility placement.   Medications reviewed and include remeron.   Lab Results  Component Value Date   HGBA1C 12.6 (H) 01/24/2023   PTA DM medications are none.   Labs reviewed: CBGS: 123-183 (inpatient orders for glycemic control are 0-15 units insulin aspart TID with meals and 0-5 units insulin aspart daily at bedtime).    NUTRITION - FOCUSED PHYSICAL EXAM:  Flowsheet Row Most Recent Value  Orbital Region No depletion  Upper Arm Region No depletion  Thoracic and Lumbar Region No depletion  Buccal Region No depletion  Temple Region No depletion  Clavicle Bone Region No depletion  Clavicle and Acromion Bone Region No depletion  Scapular Bone Region No depletion  Dorsal Hand No depletion  Patellar Region Mild depletion  Anterior Thigh Region Mild depletion  Posterior Calf Region Mild depletion  Edema (RD Assessment) Mild  Hair Reviewed  Eyes Reviewed  Mouth Reviewed  Skin Reviewed  Nails Reviewed       Diet Order:   Diet Order             Diet regular Room service appropriate? Yes; Fluid consistency: Thin  Diet effective now                   EDUCATION NEEDS:   Not appropriate for education at this time  Skin:  Skin  Assessment: Reviewed RN Assessment  Last BM:  01/26/23 (type 2)  Height:   Ht Readings from Last 1 Encounters:  01/24/23 5\' 9"  (1.753 m)    Weight:   Wt Readings from Last 1 Encounters:  01/21/23 84.9 kg    Ideal Body Weight:  72.7 kg  BMI:  Body mass index is 27.64 kg/m.  Estimated Nutritional Needs:   Kcal:  1600-1800  Protein:  85-100 grams  Fluid:  > 1.6 L    Levada Schilling, RD, LDN, CDCES Registered Dietitian II Certified Diabetes Care and Education Specialist Please refer to [facility] for RD and/or RD on-call/weekend/after hours pager

## 2023-01-28 NOTE — Progress Notes (Signed)
Physical Therapy Treatment Patient Details Name: Leah Ritter MRN: [id number] DOB: Apr 26, 1951 Today's Date: 01/28/2023   History of Present Illness 71yo female presented to ER secondary to AMS, lethargy, unable to eat/drink; admitted for management of non-ketotic hyperglycemia    PT Comments    Pt was supine in bed with HOB elevated ~ 20 degrees. Supportive grandson present and helpful throughout session. Pt's cognition greatly impacts session progression. She was able to exit bed, stand to RW, and ambulate 1 lap with RW and 1 lap without RW (~400 ft total). Pt does have balance deficits and author highly recommends use of AD at all times. She will benefit from continued skilled PT to maximize her safety and balance during all ADLs.     Recommendations for follow up therapy are one component of a multi-disciplinary discharge planning process, led by the attending physician.  Recommendations may be updated based on patient status, additional functional criteria and insurance authorization.     Assistance Recommended at Discharge Frequent or constant Supervision/Assistance  Patient can return home with the following A little help with walking and/or transfers;A little help with bathing/dressing/bathroom;Assistance with cooking/housework;Direct supervision/assist for medications management;Direct supervision/assist for financial management;Assist for transportation;Help with stairs or ramp for entrance   Equipment Recommendations  Other (comment) (defer to next level of care)       Precautions / Restrictions Precautions Precautions: Fall Restrictions Weight Bearing Restrictions: No     Mobility  Bed Mobility Overal bed mobility: Needs Assistance Bed Mobility: Supine to Sit, Sit to Supine Rolling: Min assist  Supine to sit: Min assist Sit to supine: Min assist General bed mobility comments: pt is able to perform in/out of bed with min assist when agreeable and following commands. pt  does occasionally get resistive if she doesnt want to get OOB when she doesnt want to    Transfers Overall transfer level: Needs assistance Equipment used: Rolling walker (2 wheels), 1 person hand held assist, None Transfers: Sit to/from Stand Sit to Stand: Min guard  General transfer comment: CGA for safety    Ambulation/Gait Ambulation/Gait assistance: Min assist Gait Distance (Feet): 400 Feet Assistive device: Rolling walker (2 wheels) Gait Pattern/deviations: Step-through pattern Gait velocity: WNL/ fast at time  General Gait Details: Pt was able to ambulate two laps in hallway. 1 lap with BUE support on RW and 1 lap with uingle UE support. pt needs constant vcs for staying on task and focusing. Easily distracted.       Cognition Arousal/Alertness: Awake/alert Behavior During Therapy: Impulsive Overall Cognitive Status: History of cognitive impairments - at baseline  General Comments: pt requires constant cueing to stay on task and to perform desired task requested of her           General Comments General comments (skin integrity, edema, etc.): pt had urination in hallway and in room. After gait training, pt in BR an urinated some more. Pt I'ly able to perform hygiene/wiping without assistance      Pertinent Vitals/Pain Pain Assessment Pain Assessment: No/denies pain Faces Pain Scale: No hurt     PT Goals (current goals can now be found in the care plan section) Acute Rehab PT Goals Patient Stated Goal: none stated Progress towards PT goals: Progressing toward goals    Frequency    Min 3X/week      PT Plan Current plan remains appropriate       AM-PAC PT "6 Clicks" Mobility   Outcome Measure  Help needed turning from your back to  your side while in a flat bed without using bedrails?: A Little Help needed moving from lying on your back to sitting on the side of a flat bed without using bedrails?: A Little Help needed moving to and from a bed to a chair  (including a wheelchair)?: A Little Help needed standing up from a chair using your arms (e.g., wheelchair or bedside chair)?: A Little Help needed to walk in hospital room?: A Little Help needed climbing 3-5 steps with a railing? : A Lot 6 Click Score: 17    End of Session   Activity Tolerance: Patient tolerated treatment well;Other (comment) (cognition limiting) Patient left: in bed;with call bell/phone within reach;with bed alarm set;with family/visitor present Nurse Communication: Mobility status PT Visit Diagnosis: Muscle weakness (generalized) (M62.81);Difficulty in walking, not elsewhere classified (R26.2)     Time: 1610-9604 PT Time Calculation (min) (ACUTE ONLY): 16 min  Charges:  $Gait Training: 8-22 mins                     Jetta Lout PTA 01/28/23, 2:32 PM

## 2023-01-28 NOTE — TOC Progression Note (Addendum)
Transition of Care [facility]) - Progression Note    Patient Details  Name: Leah Ritter MRN: [id number] Date of Birth: 1951-09-24  [facility] Care [facility]) CM/SW Contact  Allena Katz, LCSW Phone Number: 01/28/2023, 11:03 AM  Clinical Narrative:  CSW spoke with pt's daughter who reports she would like evaluated for hospice house at [facility].  CSW sent message to MD. Dan Humphreys [facility] notified to wait on assessment for returning. Daughter feels that patient needs more care than she was being provided at [facility]. Daughter reports she will be out of town tomorrow and will want everyone to follow up via phone.     Expected Discharge Plan: Memory Care (with hospice) Barriers to Discharge: Continued Medical Work up  Expected Discharge Plan and Services     Post Acute Care Choice: Resumption of Svcs/PTA Provider Living arrangements for the past 2 months: Assisted Living Facility (Memory Care side)                                       Social Determinants of Health (SDOH) Interventions SDOH Screenings   Food Insecurity: No Food Insecurity (07/16/2022)  Housing: Low Risk  (07/16/2022)  Transportation Needs: No Transportation Needs (07/16/2022)  Utilities: Not At Risk (07/16/2022)  Alcohol Screen: Low Risk  (09/05/2018)  Tobacco Use: Low Risk  (01/25/2023)    Readmission Risk Interventions     No data to display

## 2023-01-28 NOTE — Consult Note (Signed)
Consultation Note Date: 01/28/2023   Patient Name: Leah Ritter  DOB: April 25, 1951  MRN: [id number]  Age / Sex: 72 y.o., female  PCP: Leah Ritter Referring Physician: Rolly Salter, MD  Reason for Consultation: Establishing goals of care   HPI/Brief Ritter Course: 72 y.o. female  with past medical history of T2DM, seizure disorder, CVA, chronic diastolic CHF, OSA, CKD 3a, HTN, HLD, dementia and chronic A. Fib [facility] on 01/21/2023 with complaints of poor PO intake x 3-4 days and with increased lethargy. Found to be hyperglycemic, hypothermic and hypernatremic.   Treated for HHS, AKI superimposed by CKD 3a, CAP and electrolyte imbalances.  Ritter course complicated by acute delirium secondary to metabolic encephalopathy with underlying dementia.   Palliative medicine was consulted for assisting with goals of care conversations.  Subjective:  Extensive chart review has been completed prior to meeting patient including labs, vital signs, imaging, progress notes, orders, and available advanced directive documents from current and previous encounters.  Introduced myself as a Publishing rights manager as a member of the palliative care team. Explained palliative medicine is specialized medical care for people living with serious illness. It focuses on providing relief from the symptoms and stress of a serious illness. The goal is to improve quality of life for both the patient and the family.   Visited with Leah Ritter at her bedside. Awake and alert, remains confused, unable to answer orientation questions. Denies acute pain or discomfort, no signs of distress. Mittens remain in place. No family at bedside during time of visit. Leah Ritter is unable to participate in GOC conversations.  Called and spoke with daughter-Leah Ritter. Daughter shares that Leah Ritter has been a resident at [facility] for about 3 years.  Prior to admission, Ms. Joseph Ritter was able to ambulate with assistance from a rollator and required assistance with completing ADL's. Daughter has noticed a decline in overall function and mentation over the last 6 months.  Leah shares a noticeable decline a few days ago when Leah Ritter was refusing to eat or drink. These symptoms are what prompted Leah [facility]itter to be brought to the [facility].  Daughter confirms [name] has been receiving Hospice services from [facility] since her last [facility]itter admission in October 2023.  Daughter able to share her understanding of current medical condition and most recent updates. Shares her appreciation to medical staff of providing her with updates.  We discussed the anticipated disease trajectory of dementia with the understanding of progression and anticipated decline. Possible sudden decline with each hospitalization and acute illness/infection.  Philosophy of Hospice was reviewed. Daughter again shares her appreciation of primary team explaining difference between Hospice and Palliative care. At this time, Leah Ritter feels it is most appropriate for [facility] to resume hospice services on her return to [facility]. Questioned her ability to be placed at a Hospice facility. Briefly reviewed purpose and eligibility of Hospice IPU and at this time do not feel as though Leah Ritter would qualify.  I discussed importance of continued conversations with family/support persons and all members of their medical team regarding overall plan of care and treatment options ensuring decisions are in alignment with patients goals of care.  All questions/concerns addressed.  PMT will continue to follow and support patient as needed.  Objective: Primary Diagnoses: Present on Admission:  Hyperglycemia without ketosis  AKI (acute kidney injury) (HCC)  Acute metabolic encephalopathy  Hypernatremia  Chronic diastolic CHF (congestive heart failure) (HCC)  Physical  Exam Constitutional:      General: She is not in acute distress.    Appearance: She is ill-appearing.  Pulmonary:     Effort: Pulmonary effort is normal. No respiratory distress.  Skin:    General: Skin is warm and dry.  Neurological:     Mental Status: She is alert. She is disoriented.     Vital Signs: BP 128/72 (BP Location: Left Arm)   Pulse 69   Temp 98.4 F (36.9 C)   Resp 16   Ht 5\' 9"  (1.753 m)   Wt 84.9 kg   SpO2 100%   BMI 27.64 kg/m  Pain Scale: 0-10 POSS *See Group Information*: 2-Acceptable,Slightly drowsy, easily aroused Pain Score: 0-No pain    Palliative Assessment/Data: 60%   Assessment and Plan  SUMMARY OF RECOMMENDATIONS   DNR Resume Hospice services with [facility] on return to [facility] per family request-TOC made aware PMT to continue to follow for ongoing needs and support  Discussed With: TOC and primary team   Thank you for this consult and allowing Palliative Medicine to participate in the care of Leah Ritter. Palliative medicine will continue to follow and assist as needed.   Time Total: 75 minutes  Time spent includes: Detailed review of medical records (labs, imaging, vital signs), medically appropriate exam (mental status, respiratory, cardiac, skin), discussed with treatment team, counseling and educating patient, family and staff, documenting clinical information, medication management and coordination of care.   Signed by: Leah Deed, DNP, AGNP-C Palliative Medicine    Please contact Palliative Medicine Team phone at [phone] for questions and concerns.  For individual provider: See [facility]

## 2023-01-28 NOTE — Progress Notes (Signed)
[facility] Progress Note Patient: Leah Ritter ZOX:[id number] DOB: [birth year] DOA: 01/21/2023  DOS: the patient was seen and examined on 01/28/2023  Brief hospital course: PMH of type II DM, seizures, CVA, chronic diastolic CHF, OSA, CKD 3a, HTN, HLD, chronic A-fib presented to hospital with complaints of poor p.o. intake and lethargy.  Workup in the ED showed blood sugars of 622 with hypothermia and hypernatremia concerning for hyperglycemic nonketotic hyperosmolar state.  Admitted to the ICU. Treated with IV fluids and IV insulin therapy.  Transfer to hospital service after completion of the IV insulin protocol. Patient had ongoing encephalopathy with poor p.o. intake with frequent hypoglycemic spells. On 5/3 mentation improving significantly, able to answer questions appropriately as well as able to eat adequately and able to ambulate in the hallway with physical therapy. Assessment and Plan: Hyperosmolar hyperglycemic nonketotic state. Blood sugars was severely elevated with hyponatremia at the time of admission. Treated with IV insulin and IV fluid. Currently on basal bolus regimen as well as Premeal coverage. Due to hypoglycemic events Lantus dose was reduced to 10 units only and Premeal coverage was discontinued.  She continues to have poor p.o. intake as well as ongoing hypoglycemia therefore currently only on sliding scale insulin. Continue every 4 hour coverage.   Add D50 as needed.   Add Glucerna as well.  Will initiate calorie count while awaiting clarity on goals of care. Monitor for now.  Acute kidney injury on CKD stage IIIa Baseline GFR around 50s serum creatinine 1-1.1. On admission serum creatinine 2.23. Treated with IV hydration. Will monitor.  Hypokalemia. Hypernatremia. Sodium potassium corrected earlier.  At peak sodium was 165. Patient is not allowing blood work in the last 24 hours. Will continue to monitor and encourage patient to allow blood  work.  Community-acquired pneumonia. Patient completed 5-day antibiotic therapy.  Currently no evidence of acute infection.  Staph epi bacteria and blood. Contamination. Repeat cultures so far negative. Will monitor.  Acute delirium secondary to metabolic encephalopathy in the setting of underlying dementia. Still not clear and still not back to baseline.  But significant improvement on 5/3.  Able to follow commands able to ambulate in the hallway.  Able to answer questions appropriately. Will continue Seroquel. Will resume Remeron for now. Mentation of to be improving.  Chronic diastolic CHF. Appears to be euvolemic. Diuretics on hold.  History of OSA. Not on any CPAP therapy.  Paroxysmal A-fib. Not on any anticoagulation right now. Will monitor.  HTN. Blood pressure stable. Holding medications.  HLD. Due to poor p.o. intake currently holding medication.  Goals of care conversation. From the chart review it appears that the patient is active with [facility] at may have been reach ALF memory care unit. At baseline patient is able to walk but mostly walk with assistance and speaks some. Patient continues to exhibit poor p.o. intake. Discussed with daughter on 5/2.  Patient is on hospice at the facility for support and assistance rather than for end-of-life care.  She signs up paper to enroll in hospice on her return back to the facility.  Daughter currently prefers patient's medical conditions to be treated. Daughter understands that the patient's condition can be recurrent if her nutritional intake is inconsistent. Patient did eat good on 5/1 at dinnertime but not much intake today.  Speech is still incoherent.  I still suspect that the patient's prognosis will remain guarded going forward. Discussed that I will be observing her oral intake today and will have further conversation with  regards to nutritional needs tomorrow on 5/3. Also informed her that I will be  consulting palliative care for further clarity on goals of care. Met with daughter at bedside later in the evening again to discuss this and decide on the goal of care pathway for the patient.  Answered questions about possible future trajectory for the patient. Palliative care following on 5/4.  Appears that the family is leaning towards hospice.  Subjective: Patient was initially drowsy and answering questions only with monosyllables response.  But after some encouragement was able to follow commands.  Was also able to ambulate in the hallway with the physical therapy.  Physical Exam: Clear to auscultation. S1-S2 present. Bowel sound present. Able to follow commands.  Alert awake and oriented to self.  Data Reviewed: I have Reviewed nursing notes, Vitals, and Lab results. Reviewed CBC and BMP.  Reordered CBC and BMP.  Disposition: Status is: Inpatient Remains inpatient appropriate because: Awaiting clarity of goal of care improvement in mentation or improvement in oral intake.  heparin injection 5,000 Units Start: 01/21/23 1730 SCDs Start: 01/21/23 1719   Family Communication: Discussed with grandson at the bedside. Level of care: Med-Surg   Vitals:   01/27/23 1959 01/28/23 0606 01/28/23 0730 01/28/23 1553  BP: 119/61 (!) 121/54 128/72 125/70  Pulse: 74 70 69 62  Resp: 18 18 16 16   Temp: 98.7 F (37.1 C) 98.4 F (36.9 C) 98.4 F (36.9 C) 97.9 F (36.6 C)  TempSrc:    Oral  SpO2: 100% 99% 100% 100%  Weight:      Height:         Author: Lynden Oxford, MD 01/28/2023 5:45 PM  Please look on [email or web address] to find out who is on call.

## 2023-01-29 DIAGNOSIS — R739 Hyperglycemia, unspecified: Secondary | ICD-10-CM | POA: Diagnosis not present

## 2023-01-29 LAB — BASIC METABOLIC PANEL
Anion gap: 9 (ref 5–15)
BUN: 19 mg/dL (ref 8–23)
CO2: 23 mmol/L (ref 22–32)
Calcium: 9.1 mg/dL (ref 8.9–10.3)
Chloride: 110 mmol/L (ref 98–111)
Creatinine, Ser: 1 mg/dL (ref 0.44–1.00)
GFR, Estimated: 60 mL/min — ABNORMAL LOW (ref >60)
Glucose, Bld: 140 mg/dL — ABNORMAL HIGH (ref 70–99)
Potassium: 3.7 mmol/L (ref 3.5–5.1)
Sodium: 142 mmol/L (ref 135–145)

## 2023-01-29 LAB — CULTURE, BLOOD (ROUTINE X 2): Culture: NO GROWTH

## 2023-01-29 LAB — MAGNESIUM: Magnesium: 2 mg/dL (ref 1.7–2.4)

## 2023-01-29 LAB — GLUCOSE, CAPILLARY
Glucose-Capillary: 311 mg/dL — ABNORMAL HIGH (ref 70–99)
Glucose-Capillary: 129 mg/dL — ABNORMAL HIGH (ref 70–99)
Glucose-Capillary: 144 mg/dL — ABNORMAL HIGH (ref 70–99)

## 2023-01-29 NOTE — Progress Notes (Signed)
PROGRESS NOTE  Leah Ritter ZOX:[id number] DOB: October 12, 1950 DOA: 01/21/2023 PCP: [facility]  Brief History   PMH of type II DM, seizures, CVA, chronic diastolic CHF, OSA, CKD 3a, HTN, HLD, chronic A-fib presented to hospital with complaints of poor p.o. intake and lethargy.  Workup in the ED showed blood sugars of 622 with hypothermia and hypernatremia concerning for hyperglycemic nonketotic hyperosmolar state.  Admitted to the ICU. Treated with IV fluids and IV insulin therapy.  Transfer to hospital service after completion of the IV insulin protocol. Patient had ongoing encephalopathy with poor p.o. intake with frequent hypoglycemic spells. On 5/3 mentation improving significantly, able to answer questions appropriately as well as able to eat adequately and able to ambulate in the hallway with physical therapy.  The patient has declined markedly over the past months with regard to function and mentation.   The plan is for the patient to return to [facility] with hospice services. Palliative care does not feel that she will meet criteria for residential hospice.   A & P  Hyperosmolar hyperglycemic nonketotic state. Blood sugars was severely elevated with hyponatremia at the time of admission. Treated with IV insulin and IV fluid. Currently on basal bolus regimen as well as Premeal coverage. Due to hypoglycemic events Lantus dose was reduced to 10 units only and Premeal coverage was discontinued.  She continues to have poor p.o. intake as well as ongoing hypoglycemia therefore currently only on sliding scale insulin. Continue every 4 hour coverage.   Add D50 as needed.   Add Glucerna as well.  Will initiate calorie count while awaiting clarity on goals of care. Monitor for now. Glucoses have been somewhat erratic over the past 24 hours ranging from 103 to 311. Continue Lantus as is. Follow with FSBS and SSI.    Acute kidney injury on CKD stage IIIa Resolved. Creatinine is  1.0 on 01/29/2023 Baseline GFR around 50s serum creatinine 1-1.1. On admission serum creatinine 2.23. Treated with IV hydration. Will monitor.   Hypokalemia. Hypernatremia. Resolved Sodium potassium corrected earlier.  At peak sodium was 165. Patient is not allowing blood work in the last 24 hours. Will continue to monitor and encourage patient to allow blood work.   Community-acquired pneumonia. Patient completed 5-day antibiotic therapy.  Currently no evidence of acute infection.   Staph epi bacteria and blood. Contamination. Repeat cultures so far negative x 3 days. Will monitor.   Acute delirium secondary to metabolic encephalopathy in the setting of underlying dementia. Slowly improving.   Able to follow commands able to ambulate in the hallway.  Able to answer questions appropriately. Will continue Seroquel. Will resume Remeron for now. Mentation of to be improving.   Chronic diastolic CHF. Appears to be euvolemic. Diuretics on hold.   History of OSA. Not on any CPAP therapy.   Paroxysmal A-fib. Not on any anticoagulation right now. Will monitor.   HTN. Blood pressure stable. Holding medications.   HLD. Due to poor p.o. intake currently holding medication.   Goals of care conversation. From the chart review it appears that the patient is active with [facility] at may have been reach ALF memory care unit. At baseline patient is able to walk but mostly walk with assistance and speaks some. Patient continues to exhibit poor p.o. intake. Discussed with daughter on 5/2.  Patient is on hospice at the facility for support and assistance rather than for end-of-life care.  She signs up paper to enroll in hospice on her return back  to the facility.  Daughter currently prefers patient's medical conditions to be treated. Daughter understands that the patient's condition can be recurrent if her nutritional intake is inconsistent. Patient did eat good on 5/1 at  dinnertime but not much intake today.  Speech is still incoherent.  I still suspect that the patient's prognosis will remain guarded going forward. Discussed that I will be observing her oral intake today and will have further conversation with regards to nutritional needs tomorrow on 5/3. Also informed her that I will be consulting palliative care for further clarity on goals of care. Met with daughter at bedside later in the evening again to discuss this and decide on the goal of care pathway for the patient.  Answered questions about possible future trajectory for the patient.  Plan is to go home to [facility] with Hospice as she is unlikely to meet criteria for residential hospice.  I have seen and examined this patient myself. I have spent 34 minutes in her evaluation and care.  DVT prophylaxis: Heparin Code Status: DNR Family Communication: None available. Disposition Plan: [facility] with hospice.    Cletis Muma, DO [facility] Direct contact: see [email or web address]  7PM-7AM contact night coverage as above 01/29/2023, 1:46 PM  LOS: 8 days   Consultants  Palliative care PCCM  Procedures  None  Antibiotics   Anti-infectives (From admission, onward)    Start     Dose/Rate Route Frequency Ordered Stop   01/21/23 0945  cefTRIAXone (ROCEPHIN) 2 g in sodium chloride 0.9 % 100 mL IVPB        2 g 200 mL/hr over 30 Minutes Intravenous Every 24 hours 01/21/23 0941 01/25/23 1031   01/21/23 0945  azithromycin (ZITHROMAX) 500 mg in sodium chloride 0.9 % 250 mL IVPB        500 mg 250 mL/hr over 60 Minutes Intravenous Every 24 hours 01/21/23 0941 01/25/23 0954        Interval History/Subjective   Patient was initially drowsy and answering questions only with monosyllables response.  But after some encouragement was able to follow commands.  Was also able to ambulate in the hallway with the physical therapy.   Objective   Vitals:  Vitals:   01/29/23 0523 01/29/23 0900   BP: 125/75 128/69  Pulse:  65  Resp: 19 18  Temp: 98.3 F (36.8 C) 98 F (36.7 C)  SpO2: 96% 98%    Exam:  Constitutional:  Appears calm and comfortable Eyes:  pupils and irises appear normal Normal lids and conjunctivae ENMT:  grossly normal hearing  Lips appear normal external ears, nose appear normal Oropharynx: mucosa, tongue,posterior pharynx appear normal Neck:  neck appears normal, no masses, normal ROM, supple no thyromegaly Respiratory:  CTA bilaterally, no w/r/r.  Respiratory effort normal. No retractions or accessory muscle use Cardiovascular:  RRR, no m/r/g No LE extremity edema   Normal pedal pulses Abdomen:  Abdomen appears normal; no tenderness or masses No hernias No HSM Musculoskeletal:  Digits/nails BUE: no clubbing, cyanosis, petechiae, infection exam of joints, bones, muscles of at least one of following: head/neck, RUE, LUE, RLE, LLE   strength and tone normal, no atrophy, no abnormal movements No tenderness, masses Normal ROM, no contractures  gait and station Skin:  No rashes, lesions, ulcers palpation of skin: no induration or nodules Neurologic:  CN 2-12 intact Sensation all 4 extremities intact Psychiatric:  Mental status Mood, affect appropriate Orientation to person, place, time  judgment and insight appear intact  I have seen and examined this patient myself. I have spent 34 minutes in her evaluation and care.    I have personally reviewed the following:   Today's Data   Vitals:   01/29/23 0523 01/29/23 0900  BP: 125/75 128/69  Pulse:  65  Resp: 19 18  Temp: 98.3 F (36.8 C) 98 F (36.7 C)  SpO2: 96% 98%   Lab Data  CBC    Component Value Date/Time   WBC 6.9 01/28/2023 0631   RBC 4.11 01/28/2023 0631   HGB 11.8 (L) 01/28/2023 0631   HCT 37.1 01/28/2023 0631   PLT 175 01/28/2023 0631   MCV 90.3 01/28/2023 0631   MCH 28.7 01/28/2023 0631   MCHC 31.8 01/28/2023 0631   RDW 13.4 01/28/2023 0631   LYMPHSABS  1.6 01/21/2023 0949   MONOABS 0.6 01/21/2023 0949   EOSABS 0.1 01/21/2023 0949   BASOSABS 0.1 01/21/2023 0949      Latest Ref Rng & Units 01/29/2023    6:11 AM 01/28/2023    6:31 AM 01/27/2023    8:52 AM  BMP  Glucose 70 - 99 mg/dL 098  119  147   BUN 8 - 23 mg/dL 19  22  24    Creatinine 0.44 - 1.00 mg/dL 8.29  5.62  1.30   Sodium 135 - 145 mmol/L 142  142  144   Potassium 3.5 - 5.1 mmol/L 3.7  3.5  3.9   Chloride 98 - 111 mmol/L 110  108  112   CO2 22 - 32 mmol/L 23  27  24    Calcium 8.9 - 10.3 mg/dL 9.1  8.8  9.1      Micro Data   Results for orders placed or performed during the hospital encounter of 01/21/23  Resp panel by RT-PCR (RSV, Flu A&B, Covid) Anterior Nasal Swab     Status: None   Collection Time: 01/21/23  9:49 AM   Specimen: Anterior Nasal Swab  Result Value Ref Range Status   SARS Coronavirus 2 by RT PCR NEGATIVE NEGATIVE Final    Comment: (NOTE) SARS-CoV-2 target nucleic acids are NOT DETECTED.  The SARS-CoV-2 RNA is generally detectable in upper respiratory specimens during the acute phase of infection. The lowest concentration of SARS-CoV-2 viral copies this assay can detect is 138 copies/mL. A negative result does not preclude SARS-Cov-2 infection and should not be used as the sole basis for treatment or other patient management decisions. A negative result may occur with  improper specimen collection/handling, submission of specimen other than nasopharyngeal swab, presence of viral mutation(s) within the areas targeted by this assay, and inadequate number of viral copies(<138 copies/mL). A negative result must be combined with clinical observations, patient history, and epidemiological information. The expected result is Negative.  Fact Sheet for Patients:  BloggerCourse.com  Fact Sheet for Healthcare Providers:  SeriousBroker.it  This test is no t yet approved or cleared by the [place] FDA and   has been authorized for detection and/or diagnosis of SARS-CoV-2 by FDA under an Emergency Use Authorization (EUA). This EUA will remain  in effect (meaning this test can be used) for the duration of the COVID-19 declaration under Section 564(b)(1) of the Act, 21 U.S.C.section 360bbb-3(b)(1), unless the authorization is terminated  or revoked sooner.       Influenza A by PCR NEGATIVE NEGATIVE Final   Influenza B by PCR NEGATIVE NEGATIVE Final    Comment: (NOTE) The Xpert Xpress SARS-CoV-2/FLU/RSV plus assay is intended as an aid  in the diagnosis of influenza from Nasopharyngeal swab specimens and should not be used as a sole basis for treatment. Nasal washings and aspirates are unacceptable for Xpert Xpress SARS-CoV-2/FLU/RSV testing.  Fact Sheet for Patients: BloggerCourse.com  Fact Sheet for Healthcare Providers: SeriousBroker.it  This test is not yet approved or cleared by the [place] FDA and has been authorized for detection and/or diagnosis of SARS-CoV-2 by FDA under an Emergency Use Authorization (EUA). This EUA will remain in effect (meaning this test can be used) for the duration of the COVID-19 declaration under Section 564(b)(1) of the Act, 21 U.S.C. section 360bbb-3(b)(1), unless the authorization is terminated or revoked.     Resp Syncytial Virus by PCR NEGATIVE NEGATIVE Final    Comment: (NOTE) Fact Sheet for Patients: BloggerCourse.com  Fact Sheet for Healthcare Providers: SeriousBroker.it  This test is not yet approved or cleared by the [place] FDA and has been authorized for detection and/or diagnosis of SARS-CoV-2 by FDA under an Emergency Use Authorization (EUA). This EUA will remain in effect (meaning this test can be used) for the duration of the COVID-19 declaration under Section 564(b)(1) of the Act, 21 U.S.C. section 360bbb-3(b)(1),  unless the authorization is terminated or revoked.  Performed at [facility], [address] [place], [place] [address]   Blood Culture (routine x 2)     Status: None   Collection Time: 01/21/23 10:09 AM   Specimen: BLOOD  Result Value Ref Range Status   Specimen Description BLOOD RIGHT [facility]  Final   Special Requests   Final    BOTTLES DRAWN AEROBIC AND ANAEROBIC Blood Culture adequate volume   Culture   Final    NO GROWTH 5 DAYS Performed at [facility], [address] [place], [place] [address]    Report Status 01/26/2023 FINAL  Final  Blood Culture (routine x 2)     Status: Abnormal   Collection Time: 01/21/23 10:09 AM   Specimen: BLOOD  Result Value Ref Range Status   Specimen Description   Final    BLOOD LEFT AC Performed at [facility], [address], [place], [place] [address]    Special Requests   Final    BOTTLES DRAWN AEROBIC AND ANAEROBIC Blood Culture results may not be optimal due to an inadequate volume of blood received in culture bottles Performed at [facility], [address], [place], [place] [address]    Culture  Setup Time   Final    GRAM POSITIVE COCCI IN BOTH AEROBIC AND ANAEROBIC BOTTLES Organism ID to follow CRITICAL RESULT CALLED TO, READ BACK BY AND VERIFIED WITH: JASON ROBBINS @ 0229 01/22/23 LFD  REVIEWED BY A. LAFRANCE    Culture (A)  Final    COAGULASE NEGATIVE STAPHYLOCOCCUS STAPHYLOCOCCUS EPIDERMIDIS THE SIGNIFICANCE OF ISOLATING THIS ORGANISM FROM A SINGLE SET OF BLOOD CULTURES WHEN MULTIPLE SETS ARE DRAWN IS UNCERTAIN. PLEASE NOTIFY THE MICROBIOLOGY DEPARTMENT WITHIN ONE WEEK IF SPECIATION AND SENSITIVITIES ARE REQUIRED. Performed at [facility], [address] [place], [place] [address]    Report Status 01/26/2023 FINAL  Final  Blood Culture ID Panel (Reflexed)     Status: Abnormal   Collection Time: 01/21/23 10:09 AM  Result Value Ref Range Status   Enterococcus faecalis NOT  DETECTED NOT DETECTED Final   Enterococcus Faecium NOT DETECTED NOT DETECTED Final   Listeria monocytogenes NOT DETECTED NOT DETECTED Final   Staphylococcus species DETECTED (A) NOT DETECTED Final    Comment: CRITICAL  RESULT CALLED TO, READ BACK BY AND VERIFIED WITH: JASON ROBBINS @ 0230 01/22/23 LFD    Staphylococcus aureus (BCID) NOT DETECTED NOT DETECTED Final   Staphylococcus epidermidis NOT DETECTED NOT DETECTED Final   Staphylococcus lugdunensis NOT DETECTED NOT DETECTED Final   Streptococcus species NOT DETECTED NOT DETECTED Final   Streptococcus agalactiae NOT DETECTED NOT DETECTED Final   Streptococcus pneumoniae NOT DETECTED NOT DETECTED Final   Streptococcus pyogenes NOT DETECTED NOT DETECTED Final   A.calcoaceticus-baumannii NOT DETECTED NOT DETECTED Final   Bacteroides fragilis NOT DETECTED NOT DETECTED Final   Enterobacterales NOT DETECTED NOT DETECTED Final   Enterobacter cloacae complex NOT DETECTED NOT DETECTED Final   Escherichia coli NOT DETECTED NOT DETECTED Final   Klebsiella aerogenes NOT DETECTED NOT DETECTED Final   Klebsiella oxytoca NOT DETECTED NOT DETECTED Final   Klebsiella pneumoniae NOT DETECTED NOT DETECTED Final   Proteus species NOT DETECTED NOT DETECTED Final   Salmonella species NOT DETECTED NOT DETECTED Final   Serratia marcescens NOT DETECTED NOT DETECTED Final   Haemophilus influenzae NOT DETECTED NOT DETECTED Final   Neisseria meningitidis NOT DETECTED NOT DETECTED Final   Pseudomonas aeruginosa NOT DETECTED NOT DETECTED Final   Stenotrophomonas maltophilia NOT DETECTED NOT DETECTED Final   Candida albicans NOT DETECTED NOT DETECTED Final   Candida auris NOT DETECTED NOT DETECTED Final   Candida glabrata NOT DETECTED NOT DETECTED Final   Candida krusei NOT DETECTED NOT DETECTED Final   Candida parapsilosis NOT DETECTED NOT DETECTED Final   Candida tropicalis NOT DETECTED NOT DETECTED Final   Cryptococcus neoformans/gattii NOT DETECTED NOT  DETECTED Final    Comment: Performed at [facility], [address] [place], [place] [address]  MRSA Next Gen by PCR, Nasal     Status: None   Collection Time: 01/21/23  6:27 PM   Specimen: Nasal Mucosa; Nasal Swab  Result Value Ref Range Status   MRSA by PCR Next Gen NOT DETECTED NOT DETECTED Final    Comment: (NOTE) The GeneXpert MRSA Assay (FDA approved for NASAL specimens only), is one component of a comprehensive MRSA colonization surveillance program. It is not intended to diagnose MRSA infection nor to guide or monitor treatment for MRSA infections. Test performance is not FDA approved in patients less than 74 years old. Performed at [facility], [address] [place], [place] [address]   Culture, blood (Routine X 2) w Reflex to ID Panel     Status: None (Preliminary result)   Collection Time: 01/26/23  3:01 PM   Specimen: BLOOD  Result Value Ref Range Status   Specimen Description BLOOD BLOOD LEFT HAND  Final   Special Requests   Final    BOTTLES DRAWN AEROBIC AND ANAEROBIC Blood Culture adequate volume   Culture   Final    NO GROWTH 3 DAYS Performed at [facility], [address], [place], [place] [address]    Report Status PENDING  Incomplete  Culture, blood (Routine X 2) w Reflex to ID Panel     Status: None (Preliminary result)   Collection Time: 01/26/23  3:18 PM   Specimen: BLOOD  Result Value Ref Range Status   Specimen Description BLOOD BLOOD RIGHT HAND  Final   Special Requests   Final    BOTTLES DRAWN AEROBIC ONLY Blood Culture results may not be optimal due to an inadequate volume of blood received in culture bottles   Culture   Final    NO GROWTH 3 DAYS Performed  at [facility], [address], [place], [place] [address]    Report Status PENDING  Incomplete     Imaging  CT head: Chronic right frontal infarct. No acute intracranial pathology  Scheduled Meds:  feeding supplement  237 mL Oral TID BM    heparin  5,000 Units Subcutaneous Q8H   insulin aspart  0-15 Units Subcutaneous TID WC   insulin aspart  0-5 Units Subcutaneous QHS   mirabegron ER  25 mg Oral Daily   mirtazapine  15 mg Oral QHS   multivitamin with minerals  1 tablet Oral Daily   nystatin  5 mL Oral QID   mouth rinse  15 mL Mouth Rinse 4 times per day   QUEtiapine  25 mg Oral QHS   Continuous Infusions: None  Principal Problem:   Hyperglycemia without ketosis Active Problems:   Acute metabolic encephalopathy   AKI (acute kidney injury) (HCC)   Chronic diastolic CHF (congestive heart failure) (HCC)   Hypernatremia   LOS: 8 days

## 2023-01-29 NOTE — Progress Notes (Signed)
Met with Ms. Deterding at her bedside. Improved mentation-able to answer simple orientation questions. No family at bedside during time of visit.  Attempted to call daughter-Suszette without success, VM left.  PMT will continue to follow for ongoing needs and support.  No Charge.  Leeanne Deed, DNP, AGNP-C Palliative Medicine  Please call Palliative Medicine team phone with any questions [phone]. For individual providers please see AMION.

## 2023-01-30 DIAGNOSIS — R739 Hyperglycemia, unspecified: Secondary | ICD-10-CM | POA: Diagnosis not present

## 2023-01-30 DIAGNOSIS — I5032 Chronic diastolic (congestive) heart failure: Secondary | ICD-10-CM

## 2023-01-30 LAB — CBC WITH DIFFERENTIAL/PLATELET
Abs Immature Granulocytes: 0.02 10*3/uL (ref 0.00–0.07)
Basophils Absolute: 0 10*3/uL (ref 0.0–0.1)
Basophils Relative: 1 %
Eosinophils Absolute: 0.2 10*3/uL (ref 0.0–0.5)
Eosinophils Relative: 3 %
HCT: 37.3 % (ref 36.0–46.0)
Hemoglobin: 12.2 g/dL (ref 12.0–15.0)
Immature Granulocytes: 0 %
Lymphocytes Relative: 33 %
Lymphs Abs: 2.1 10*3/uL (ref 0.7–4.0)
MCH: 29.3 pg (ref 26.0–34.0)
MCHC: 32.7 g/dL (ref 30.0–36.0)
MCV: 89.4 fL (ref 80.0–100.0)
Monocytes Absolute: 0.6 10*3/uL (ref 0.1–1.0)
Monocytes Relative: 9 %
Neutro Abs: 3.6 10*3/uL (ref 1.7–7.7)
Neutrophils Relative %: 54 %
Platelets: 148 10*3/uL — ABNORMAL LOW (ref 150–400)
RBC: 4.17 MIL/uL (ref 3.87–5.11)
RDW: 13.5 % (ref 11.5–15.5)
WBC: 6.5 10*3/uL (ref 4.0–10.5)
nRBC: 0 % (ref 0.0–0.2)

## 2023-01-30 LAB — BASIC METABOLIC PANEL
Anion gap: 6 (ref 5–15)
BUN: 21 mg/dL (ref 8–23)
CO2: 27 mmol/L (ref 22–32)
Calcium: 9.4 mg/dL (ref 8.9–10.3)
Chloride: 108 mmol/L (ref 98–111)
Creatinine, Ser: 1.13 mg/dL — ABNORMAL HIGH (ref 0.44–1.00)
GFR, Estimated: 52 mL/min — ABNORMAL LOW (ref >60)
Glucose, Bld: 192 mg/dL — ABNORMAL HIGH (ref 70–99)
Potassium: 4.5 mmol/L (ref 3.5–5.1)
Sodium: 141 mmol/L (ref 135–145)

## 2023-01-30 LAB — GLUCOSE, CAPILLARY
Glucose-Capillary: 152 mg/dL — ABNORMAL HIGH (ref 70–99)
Glucose-Capillary: 260 mg/dL — ABNORMAL HIGH (ref 70–99)
Glucose-Capillary: 235 mg/dL — ABNORMAL HIGH (ref 70–99)
Glucose-Capillary: 75 mg/dL (ref 70–99)

## 2023-01-30 LAB — CULTURE, BLOOD (ROUTINE X 2)
Culture: NO GROWTH
Special Requests: ADEQUATE

## 2023-01-30 MED ORDER — CHLORHEXIDINE GLUCONATE CLOTH 2 % EX PADS
6.0000 | MEDICATED_PAD | Freq: Every day | CUTANEOUS | Status: DC
  Administered 2023-01-30 – 2023-02-03 (×5): 6 via TOPICAL

## 2023-01-30 NOTE — Progress Notes (Signed)
       CROSS COVER NOTE  NAME: Leah Ritter MRN: [id number] DOB : 23-Apr-1951    HPI/Events of Note   Report:urinary retention 572 ml bladder scan. Patient very agitated and refusing nurse to assist. Reports patient more cooperative durin the day  On review of chart:    Assessment and  Interventions   Assessment:  Plan: Will delay in and out at this time until staff can assist patient with trying to void on own and then if unable to should be evaluated for in and out vs foley       Donnie Mesa NP [facility]

## 2023-01-30 NOTE — Progress Notes (Signed)
Daily Progress Note   Patient Name: Leah Ritter       Date: 01/30/2023 DOB: 07-Dec-1950  Age: 72 y.o. MRN#: [id number] Attending Physician: Fran Lowes, DO Primary Care Physician: Almetta Lovely, [facility] Admit Date: 01/21/2023  Reason for Consultation/Follow-up: Establishing goals of care  HPI/Brief Hospital Review: 72 y.o. female  with past medical history of T2DM, seizure disorder, CVA, chronic diastolic CHF, OSA, CKD 3a, HTN, HLD, dementia and chronic A. Fib admitted from [facility] on 01/21/2023 with complaints of poor PO intake x 3-4 days and with increased lethargy. Found to be hyperglycemic, hypothermic and hypernatremic.    Treated for HHS, AKI superimposed by CKD 3a, CAP and electrolyte imbalances.   Hospital course complicated by acute delirium secondary to metabolic encephalopathy with underlying dementia.    Palliative medicine was consulted for assisting with goals of care conversations.  Subjective: Extensive chart review has been completed prior to meeting patient including labs, vital signs, imaging, progress notes, orders, and available advanced directive documents from current and previous encounters.    Visited with Leah Ritter at her bedside. Awake and alert and able to answer simple orientation questions. Without full capacity to participate in complex medical decision making.  Called and spoke with daughter-Leah Ritter, plan remains to return to [facility] with resumption of Hospice services through [facility]. Leah Ritter was anticipating a call from Hospice-recommended she follow up with her point of contact for [facility]. TOC will be in contact regarding return to [facility].  Answered and addressed all questions and concerns. PMT will follow  along for ongoing needs and support.  Objective:  Vital Signs: BP (!) 171/94 (BP Location: Left Arm) Comment: patient was fighting when trying to obtain BP  Pulse 73   Temp 97.6 F (36.4 C) (Oral)   Resp 16   Ht 5\' 9"  (1.753 m)   Wt 84.9 kg   SpO2 100%   BMI 27.64 kg/m  SpO2: SpO2: 100 % O2 Device: O2 Device: Room Air O2 Flow Rate:     Palliative Care Assessment & Plan   Assessment/Recommendation/Plan  DNR Anticipate discharge back to [facility] with resuming hospice services through [place] PMT will follow for ongoing needs and support  Thank you for allowing the Palliative Medicine Team to assist in the care of this patient.  Total time:  25 minutes  Time spent includes: Detailed review of medical records (labs, imaging, vital signs), medically appropriate exam (mental status, respiratory, cardiac, skin), discussed with treatment team, counseling and educating patient, family and staff, documenting clinical information, medication management and coordination of care.  Leeanne Deed, DNP, AGNP-C Palliative Medicine   Please contact Palliative Medicine Team phone at [phone] for questions and concerns.

## 2023-01-30 NOTE — Progress Notes (Signed)
PROGRESS NOTE  Leah Ritter ZOX:[id number] DOB: 1951/05/07 DOA: 01/21/2023 PCP: [facility]  Brief History   PMH of type II DM, seizures, CVA, chronic diastolic CHF, OSA, CKD 3a, HTN, HLD, chronic A-fib presented to hospital with complaints of poor p.o. intake and lethargy.  Workup in the ED showed blood sugars of 622 with hypothermia and hypernatremia concerning for hyperglycemic nonketotic hyperosmolar state.  Admitted to the ICU. Treated with IV fluids and IV insulin therapy.  Transfer to hospital service after completion of the IV insulin protocol. Patient had ongoing encephalopathy with poor p.o. intake with frequent hypoglycemic spells. On 5/3 mentation improving significantly, able to answer questions appropriately as well as able to eat adequately and able to ambulate in the hallway with physical therapy.  The patient has declined markedly over the past months with regard to function and mentation.   Staph epi grown from 1/2 bottles on 01/21/2023. Repeat blood cultures negative x 5 days.   The plan is for the patient to return to [facility] with hospice services. Palliative care does not feel that she will meet criteria for residential hospice.   A & P  Hyperosmolar hyperglycemic nonketotic state. Blood sugars was severely elevated with hyponatremia at the time of admission. Treated with IV insulin and IV fluid. Currently on basal bolus regimen as well as Premeal coverage. Due to hypoglycemic events Lantus dose was reduced to 10 units only and Premeal coverage was discontinued.  She continues to have poor p.o. intake as well as ongoing hypoglycemia therefore currently only on sliding scale insulin. Continue every 4 hour coverage.   Add D50 as needed.   Add Glucerna as well.  Will initiate calorie count while awaiting clarity on goals of care. Monitor for now. Glucoses have been improved over the past 24 hours ranging from 129-260.  Continue Lantus as is. Follow with  FSBS and SSI.    Acute kidney injury on CKD stage IIIa Resolved. Creatinine is 1.13 on 01/30/2023 Baseline GFR around 50s serum creatinine 1-1.1. On admission serum creatinine 2.23. Treated with IV hydration. Will monitor.   Hypokalemia. Potassium 4.5 Hypernatremia. Sodium 141 Resolved Sodium potassium corrected earlier.  At peak sodium was 165. Patient is not allowing blood work in the last 24 hours. Will continue to monitor and encourage patient to allow blood work.   Community-acquired pneumonia. Patient completed 5-day antibiotic therapy.  Currently no evidence of acute infection.   Staph epi bacteria and blood. Contamination. Repeat cultures so far negative x 5 days. Will monitor.   Acute delirium secondary to metabolic encephalopathy in the setting of underlying dementia. Able to follow commands able to ambulate in the hallway.  Able to answer questions appropriately. Will continue Seroquel. Will resume Remeron for now. Mentation of to be improving. Slowly improving.      Chronic diastolic CHF. Appears to be euvolemic. Diuretics on hold. Monitor volume status.  History of OSA. Not on any CPAP therapy.   Paroxysmal A-fib. Not on any anticoagulation right now. Will monitor.   HTN. Blood pressure stable. Holding medications.   HLD. Due to poor p.o. intake currently holding medication.   Goals of care conversation. From the chart review it appears that the patient is active with [facility]. At baseline patient is able to walk with assistance and speaks some. Patient continues to exhibit poor p.o. intake. Discussed with daughter on 5/2.  Patient is on hospice at the facility for support and assistance rather than  for end-of-life care.  She signs up paper to enroll in hospice on her return back to the facility.  Daughter currently prefers patient's medical conditions to be treated. Daughter understands that the patient's condition  can be recurrent if her nutritional intake is inconsistent. Patient did eat well on 5/1 at dinnertime but not much intake today.  Speech is still incoherent.  I still suspect that the patient's prognosis will remain guarded going forward. Discussed that I will be observing her oral intake today and will have further conversation with regards to nutritional needs tomorrow on 5/3. Also informed her that I will be consulting palliative care for further clarity on goals of care. Met with daughter at bedside later in the evening again to discuss this and decide on the goal of care pathway for the patient.  Answered questions about possible future trajectory for the patient.  Plan is to discharge to [facility] with Hospice as she is unlikely to meet criteria for residential hospice.  I have seen and examined this patient myself. I have spent 34 minutes in her evaluation and care.  DVT prophylaxis: Heparin Code Status: DNR Family Communication: None available. Disposition Plan: [facility] with hospice.    Demarquis Osley, DO [facility] Direct contact: see [email or web address]  7PM-7AM contact night coverage as above 01/30/2023, 2:54 PM  LOS: 8 days   Consultants  Palliative care PCCM  Procedures  None  Antibiotics   Anti-infectives (From admission, onward)    Start     Dose/Rate Route Frequency Ordered Stop   01/21/23 0945  cefTRIAXone (ROCEPHIN) 2 g in sodium chloride 0.9 % 100 mL IVPB        2 g 200 mL/hr over 30 Minutes Intravenous Every 24 hours 01/21/23 0941 01/25/23 1031   01/21/23 0945  azithromycin (ZITHROMAX) 500 mg in sodium chloride 0.9 % 250 mL IVPB        500 mg 250 mL/hr over 60 Minutes Intravenous Every 24 hours 01/21/23 0941 01/25/23 0954        Interval History/Subjective   Patient was initially drowsy and answering questions only with monosyllables response.  But after some encouragement was able to follow commands.  Was also able to ambulate in the hallway  with the physical therapy.   Objective   Vitals:  Vitals:   01/30/23 0535 01/30/23 0900  BP: (!) 163/81 (!) 171/94  Pulse: 73   Resp: 16   Temp: 97.6 F (36.4 C)   SpO2: 100% 100%     Exam:  Constitutional:  The patient is awake, alert, and oriented x 2. No acute distress. Respiratory:  No increased work of breathing. No wheezes, rales, or rhonchi No tactile fremitus Cardiovascular:  Regular rate and rhythm No murmurs, ectopy, or gallups. No lateral PMI. No thrills. Abdomen:  Abdomen is soft, non-tender, non-distended No hernias, masses, or organomegaly Normoactive bowel sounds.  Musculoskeletal:  No cyanosis, clubbing, or edema Skin:  No rashes, lesions, ulcers palpation of skin: no induration or nodules Neurologic:  CN 2-12 intact Sensation all 4 extremities intact Psychiatric:  Mental status Mood, affect appropriate Orientation to person, place, time  judgment and insight appear intact  I have seen and examined this patient myself. I have spent 32 minutes in her evaluation and care.    I have personally reviewed the following:   Today's Data   Vitals:   01/30/23 0535 01/30/23 0900  BP: (!) 163/81 (!) 171/94  Pulse: 73   Resp: 16   Temp: 97.6  F (36.4 C)   SpO2: 100% 100%    Lab Data  CBC    Component Value Date/Time   WBC 6.5 01/30/2023 0900   RBC 4.17 01/30/2023 0900   HGB 12.2 01/30/2023 0900   HCT 37.3 01/30/2023 0900   PLT 148 (L) 01/30/2023 0900   MCV 89.4 01/30/2023 0900   MCH 29.3 01/30/2023 0900   MCHC 32.7 01/30/2023 0900   RDW 13.5 01/30/2023 0900   LYMPHSABS 2.1 01/30/2023 0900   MONOABS 0.6 01/30/2023 0900   EOSABS 0.2 01/30/2023 0900   BASOSABS 0.0 01/30/2023 0900      Latest Ref Rng & Units 01/30/2023    9:00 AM 01/29/2023    6:11 AM 01/28/2023    6:31 AM  BMP  Glucose 70 - 99 mg/dL 161  096  045   BUN 8 - 23 mg/dL 21  19  22    Creatinine 0.44 - 1.00 mg/dL 4.09  8.11  9.14   Sodium 135 - 145 mmol/L 141  142  142    Potassium 3.5 - 5.1 mmol/L 4.5  3.7  3.5   Chloride 98 - 111 mmol/L 108  110  108   CO2 22 - 32 mmol/L 27  23  27    Calcium 8.9 - 10.3 mg/dL 9.4  9.1  8.8        Micro Data   Results for orders placed or performed during the hospital encounter of 01/21/23  Resp panel by RT-PCR (RSV, Flu A&B, Covid) Anterior Nasal Swab     Status: None   Collection Time: 01/21/23  9:49 AM   Specimen: Anterior Nasal Swab  Result Value Ref Range Status   SARS Coronavirus 2 by RT PCR NEGATIVE NEGATIVE Final    Comment: (NOTE) SARS-CoV-2 target nucleic acids are NOT DETECTED.  The SARS-CoV-2 RNA is generally detectable in upper respiratory specimens during the acute phase of infection. The lowest concentration of SARS-CoV-2 viral copies this assay can detect is 138 copies/mL. A negative result does not preclude SARS-Cov-2 infection and should not be used as the sole basis for treatment or other patient management decisions. A negative result may occur with  improper specimen collection/handling, submission of specimen other than nasopharyngeal swab, presence of viral mutation(s) within the areas targeted by this assay, and inadequate number of viral copies(<138 copies/mL). A negative result must be combined with clinical observations, patient history, and epidemiological information. The expected result is Negative.  Fact Sheet for Patients:  BloggerCourse.com  Fact Sheet for Healthcare Providers:  SeriousBroker.it  This test is no t yet approved or cleared by the [place] FDA and  has been authorized for detection and/or diagnosis of SARS-CoV-2 by FDA under an Emergency Use Authorization (EUA). This EUA will remain  in effect (meaning this test can be used) for the duration of the COVID-19 declaration under Section 564(b)(1) of the Act, 21 U.S.C.section 360bbb-3(b)(1), unless the authorization is terminated  or revoked sooner.        Influenza A by PCR NEGATIVE NEGATIVE Final   Influenza B by PCR NEGATIVE NEGATIVE Final    Comment: (NOTE) The Xpert Xpress SARS-CoV-2/FLU/RSV plus assay is intended as an aid in the diagnosis of influenza from Nasopharyngeal swab specimens and should not be used as a sole basis for treatment. Nasal washings and aspirates are unacceptable for Xpert Xpress SARS-CoV-2/FLU/RSV testing.  Fact Sheet for Patients: BloggerCourse.com  Fact Sheet for Healthcare Providers: SeriousBroker.it  This test is not yet approved or cleared by the  [place] Fut[employer] trader and has been authorized for detection and/or diagnosis of SARS-CoV-2 by FDA under an TEFL teacher (EUA). This EUA will remain in effect (meaning this test can be used) for the duration of the COVID-19 declaration under Section 564(b)(1) of the Act, 21 U.S.C. section 360bbb-3(b)(1), unless the authorization is terminated or revoked.     Resp Syncytial Virus by PCR NEGATIVE NEGATIVE Final    Comment: (NOTE) Fact Sheet for Patients: BloggerCourse.com  Fact Sheet for Healthcare Providers: SeriousBroker.it  This test is not yet approved or cleared by the [place] FDA and has been authorized for detection and/or diagnosis of SARS-CoV-2 by FDA under an Emergency Use Authorization (EUA). This EUA will remain in effect (meaning this test can be used) for the duration of the COVID-19 declaration under Section 564(b)(1) of the Act, 21 U.S.C. section 360bbb-3(b)(1), unless the authorization is terminated or revoked.  Performed at [facility], [address] [place], [place] [address]   Blood Culture (routine x 2)     Status: None   Collection Time: 01/21/23 10:09 AM   Specimen: BLOOD  Result Value Ref Range Status   Specimen Description BLOOD RIGHT [facility]  Final   Special Requests   Final    BOTTLES DRAWN AEROBIC  AND ANAEROBIC Blood Culture adequate volume   Culture   Final    NO GROWTH 5 DAYS Performed at [facility], [address] [place], [place] [address]    Report Status 01/26/2023 FINAL  Final  Blood Culture (routine x 2)     Status: Abnormal   Collection Time: 01/21/23 10:09 AM   Specimen: BLOOD  Result Value Ref Range Status   Specimen Description   Final    BLOOD LEFT AC Performed at [facility], [address], [place], [place] [address]    Special Requests   Final    BOTTLES DRAWN AEROBIC AND ANAEROBIC Blood Culture results may not be optimal due to an inadequate volume of blood received in culture bottles Performed at [facility], [address], [place], [place] [address]    Culture  Setup Time   Final    GRAM POSITIVE COCCI IN BOTH AEROBIC AND ANAEROBIC BOTTLES Organism ID to follow CRITICAL RESULT CALLED TO, READ BACK BY AND VERIFIED WITH: JASON ROBBINS @ 0229 01/22/23 LFD  REVIEWED BY A. LAFRANCE    Culture (A)  Final    COAGULASE NEGATIVE STAPHYLOCOCCUS STAPHYLOCOCCUS EPIDERMIDIS THE SIGNIFICANCE OF ISOLATING THIS ORGANISM FROM A SINGLE SET OF BLOOD CULTURES WHEN MULTIPLE SETS ARE DRAWN IS UNCERTAIN. PLEASE NOTIFY THE MICROBIOLOGY DEPARTMENT WITHIN ONE WEEK IF SPECIATION AND SENSITIVITIES ARE REQUIRED. Performed at [facility], [address] [place], [place] [address]    Report Status 01/26/2023 FINAL  Final  Blood Culture ID Panel (Reflexed)     Status: Abnormal   Collection Time: 01/21/23 10:09 AM  Result Value Ref Range Status   Enterococcus faecalis NOT DETECTED NOT DETECTED Final   Enterococcus Faecium NOT DETECTED NOT DETECTED Final   Listeria monocytogenes NOT DETECTED NOT DETECTED Final   Staphylococcus species DETECTED (A) NOT DETECTED Final    Comment: CRITICAL RESULT CALLED TO, READ BACK BY AND VERIFIED WITH: JASON ROBBINS @ 0230 01/22/23 LFD    Staphylococcus aureus (BCID) NOT DETECTED NOT DETECTED Final    Staphylococcus epidermidis NOT DETECTED NOT DETECTED Final   Staphylococcus lugdunensis NOT DETECTED NOT DETECTED Final   Streptococcus species NOT DETECTED NOT DETECTED Final  Streptococcus agalactiae NOT DETECTED NOT DETECTED Final   Streptococcus pneumoniae NOT DETECTED NOT DETECTED Final   Streptococcus pyogenes NOT DETECTED NOT DETECTED Final   A.calcoaceticus-baumannii NOT DETECTED NOT DETECTED Final   Bacteroides fragilis NOT DETECTED NOT DETECTED Final   Enterobacterales NOT DETECTED NOT DETECTED Final   Enterobacter cloacae complex NOT DETECTED NOT DETECTED Final   Escherichia coli NOT DETECTED NOT DETECTED Final   Klebsiella aerogenes NOT DETECTED NOT DETECTED Final   Klebsiella oxytoca NOT DETECTED NOT DETECTED Final   Klebsiella pneumoniae NOT DETECTED NOT DETECTED Final   Proteus species NOT DETECTED NOT DETECTED Final   Salmonella species NOT DETECTED NOT DETECTED Final   Serratia marcescens NOT DETECTED NOT DETECTED Final   Haemophilus influenzae NOT DETECTED NOT DETECTED Final   Neisseria meningitidis NOT DETECTED NOT DETECTED Final   Pseudomonas aeruginosa NOT DETECTED NOT DETECTED Final   Stenotrophomonas maltophilia NOT DETECTED NOT DETECTED Final   Candida albicans NOT DETECTED NOT DETECTED Final   Candida auris NOT DETECTED NOT DETECTED Final   Candida glabrata NOT DETECTED NOT DETECTED Final   Candida krusei NOT DETECTED NOT DETECTED Final   Candida parapsilosis NOT DETECTED NOT DETECTED Final   Candida tropicalis NOT DETECTED NOT DETECTED Final   Cryptococcus neoformans/gattii NOT DETECTED NOT DETECTED Final    Comment: Performed at [facility], [address] [place], [place] [address]  MRSA Next Gen by PCR, Nasal     Status: None   Collection Time: 01/21/23  6:27 PM   Specimen: Nasal Mucosa; Nasal Swab  Result Value Ref Range Status   MRSA by PCR Next Gen NOT DETECTED NOT DETECTED Final    Comment: (NOTE) The GeneXpert MRSA Assay (FDA approved  for NASAL specimens only), is one component of a comprehensive MRSA colonization surveillance program. It is not intended to diagnose MRSA infection nor to guide or monitor treatment for MRSA infections. Test performance is not FDA approved in patients less than 25 years old. Performed at [facility], [address] [place], [place] [address]   Culture, blood (Routine X 2) w Reflex to ID Panel     Status: None (Preliminary result)   Collection Time: 01/26/23  3:01 PM   Specimen: BLOOD  Result Value Ref Range Status   Specimen Description BLOOD BLOOD LEFT HAND  Final   Special Requests   Final    BOTTLES DRAWN AEROBIC AND ANAEROBIC Blood Culture adequate volume   Culture   Final    NO GROWTH 3 DAYS Performed at [facility], [address], [place], [place] [address]    Report Status PENDING  Incomplete  Culture, blood (Routine X 2) w Reflex to ID Panel     Status: None (Preliminary result)   Collection Time: 01/26/23  3:18 PM   Specimen: BLOOD  Result Value Ref Range Status   Specimen Description BLOOD BLOOD RIGHT HAND  Final   Special Requests   Final    BOTTLES DRAWN AEROBIC ONLY Blood Culture results may not be optimal due to an inadequate volume of blood received in culture bottles   Culture   Final    NO GROWTH 3 DAYS Performed at [facility], [address], [place], [place] [address]    Report Status PENDING  Incomplete     Imaging  CT head: Chronic right frontal infarct. No acute intracranial pathology  Scheduled Meds:  feeding supplement  237 mL Oral TID BM   heparin  5,000 Units Subcutaneous Q8H  insulin aspart  0-15 Units Subcutaneous TID WC   insulin aspart  0-5 Units Subcutaneous QHS   mirabegron ER  25 mg Oral Daily   mirtazapine  15 mg Oral QHS   multivitamin with minerals  1 tablet Oral Daily   nystatin  5 mL Oral QID   mouth rinse  15 mL Mouth Rinse 4 times per day   QUEtiapine  25 mg Oral QHS   Continuous  Infusions: None  Principal Problem:   Hyperglycemia without ketosis Active Problems:   Acute metabolic encephalopathy   AKI (acute kidney injury) (HCC)   Chronic diastolic CHF (congestive heart failure) (HCC)   Hypernatremia   LOS: 8 days

## 2023-01-31 DIAGNOSIS — R739 Hyperglycemia, unspecified: Secondary | ICD-10-CM | POA: Diagnosis not present

## 2023-01-31 LAB — BASIC METABOLIC PANEL
Anion gap: 9 (ref 5–15)
BUN: 22 mg/dL (ref 8–23)
CO2: 23 mmol/L (ref 22–32)
Calcium: 9 mg/dL (ref 8.9–10.3)
Chloride: 110 mmol/L (ref 98–111)
Creatinine, Ser: 0.98 mg/dL (ref 0.44–1.00)
GFR, Estimated: >60 mL/min (ref >60)
Glucose, Bld: 160 mg/dL — ABNORMAL HIGH (ref 70–99)
Potassium: 3.9 mmol/L (ref 3.5–5.1)
Sodium: 142 mmol/L (ref 135–145)

## 2023-01-31 LAB — CULTURE, BLOOD (ROUTINE X 2)

## 2023-01-31 LAB — GLUCOSE, CAPILLARY
Glucose-Capillary: 146 mg/dL — ABNORMAL HIGH (ref 70–99)
Glucose-Capillary: 267 mg/dL — ABNORMAL HIGH (ref 70–99)
Glucose-Capillary: 381 mg/dL — ABNORMAL HIGH (ref 70–99)
Glucose-Capillary: 150 mg/dL — ABNORMAL HIGH (ref 70–99)

## 2023-01-31 LAB — PHOSPHORUS: Phosphorus: 2.6 mg/dL (ref 2.5–4.6)

## 2023-01-31 LAB — MAGNESIUM: Magnesium: 2 mg/dL (ref 1.7–2.4)

## 2023-01-31 NOTE — TOC Progression Note (Signed)
Transition of Care [facility]) - Progression Note    Patient Details  Name: Leah Ritter MRN: [id number] Date of Birth: 08-15-1951  Transition of Care [facility]) CM/SW Contact  Allena Katz, LCSW Phone Number: 01/31/2023, 10:16 AM  Clinical Narrative:   CSW spoke with Elita Quick at [facility] who states she is not at work yet but will follow up with her nurse as soon as she can to see when they can come out to assess patient for return.    Expected Discharge Plan: Memory Care (with hospice) Barriers to Discharge: Continued Medical Work up  Expected Discharge Plan and Services     Post Acute Care Choice: Resumption of Svcs/PTA Provider Living arrangements for the past 2 months: Assisted Living Facility (Memory Care side)                                       Social Determinants of Health (SDOH) Interventions SDOH Screenings   Food Insecurity: No Food Insecurity (07/16/2022)  Housing: Low Risk  (07/16/2022)  Transportation Needs: No Transportation Needs (07/16/2022)  Utilities: Not At Risk (07/16/2022)  Alcohol Screen: Low Risk  (09/05/2018)  Tobacco Use: Low Risk  (01/25/2023)    Readmission Risk Interventions     No data to display

## 2023-01-31 NOTE — Progress Notes (Signed)
Nutrition Follow-up  DOCUMENTATION CODES:   Not applicable  INTERVENTION:   -D/c calorie count -Continue Ensure Enlive po TID, each supplement provides 350 kcal and 20 grams of protein.  -Continue MVI with minerals daily -Continue Magic cup TID with meals, each supplement provides 290 kcal and 9 grams of protein  -Continue feeding assistance with meals -Continue with regular diet for widest variety of meal selections  NUTRITION DIAGNOSIS:   Inadequate oral intake related to poor appetite as evidenced by per patient/family report.  Ongoing  GOAL:   Patient will meet greater than or equal to 90% of their needs  Unmet  MONITOR:   PO intake, Supplement acceptance  REASON FOR ASSESSMENT:   Consult, Low Braden Assessment of nutrition requirement/status, Calorie Count  ASSESSMENT:   Pt with past medical history of multiple medical issues including afib, CHF, diabetes, history of seizures, hx/o CVA presenting w/ hypothermia, HHS, hypernatremia, encephalopathy.  4/27- s/p BSE- regular diet with thin liquids   Reviewed I/O's: -985 ml x 24 hours and -4.1 L since admission  UOP: 1.3 L x 24 hours   5/2-5/3 Breakfast: 0% completed Lunch: 434 kcals, 24 grams protein Dinner: 85 kcals, 3 grams protein    Total intake: 519 kcal (32% of minimum estimated needs)  27 grams protein (32% of minimum estimated needs)  5/3-5/4 Breakfast: 0% completed Lunch: 0% completed Dinner: 0% completed   Total intake: 0 kcal (0% of minimum estimated needs)  0 grams protein (0% of minimum estimated needs)  Total intake: 260 kcal (16% of minimum estimated needs)  14 grams protein (17% of minimum estimated needs)  Per palliative care notes, pt does not meet criteria for residential hospice at this time. Plan to d/c back to ALF [facility]) with hopsice.   Medications reviewed and include remeron  Labs reviewed: CBGS: 146 (inpatient orders for glycemic control are 0-15 units insulin  aspart TID with meals and 0-5 units insulin aspart daily at bedtime).    Diet Order:   Diet Order             Diet regular Room service appropriate? Yes; Fluid consistency: Thin  Diet effective now                   EDUCATION NEEDS:   Not appropriate for education at this time  Skin:  Skin Assessment: Reviewed RN Assessment  Last BM:  01/26/23 (type 2)  Height:   Ht Readings from Last 1 Encounters:  01/24/23 5\' 9"  (1.753 m)    Weight:   Wt Readings from Last 1 Encounters:  01/21/23 84.9 kg    Ideal Body Weight:  72.7 kg  BMI:  Body mass index is 27.64 kg/m.  Estimated Nutritional Needs:   Kcal:  1600-1800  Protein:  85-100 grams  Fluid:  > 1.6 L    Levada Schilling, RD, LDN, CDCES Registered Dietitian II Certified Diabetes Care and Education Specialist Please refer to [facility] for RD and/or RD on-call/weekend/after hours pager

## 2023-01-31 NOTE — Progress Notes (Signed)
Occupational Therapy Treatment Patient Details Name: Leah Ritter MRN: [id number] DOB: 11/16/1950 Today's Date: 01/31/2023   History of present illness 72yo female presented to ER secondary to AMS, lethargy, unable to eat/drink; admitted for management of non-ketotic hyperglycemia   OT comments  Upon entering the room, pt in recliner chair with upper body and head on section where her buttocks should be and feet hanging over chair towards floor very unsafely. Telesitter on in room. OT attempting to reposition pt in chair for safety and transfer to bed as she appears fatigued. Pt yelling, "Leave me the f**k alone" multiple times. OT called for another set of hands to scoot pt back unto chair for safety. Chair alarm belt donned to further decrease fall risk. Pt unwilling to attempt any additional therapy this session.    Recommendations for follow up therapy are one component of a multi-disciplinary discharge planning process, led by the attending physician.  Recommendations may be updated based on patient status, additional functional criteria and insurance authorization.    Assistance Recommended at Discharge Frequent or constant Supervision/Assistance  Patient can return home with the following  Two people to help with walking and/or transfers;Two people to help with bathing/dressing/bathroom;Assist for transportation;Assistance with cooking/housework;Direct supervision/assist for medications management;Direct supervision/assist for financial management;Assistance with feeding;Help with stairs or ramp for entrance   Equipment Recommendations  Other (comment) (defer to next venue of care)       Precautions / Restrictions Precautions Precautions: Fall Restrictions Weight Bearing Restrictions: No              ADL either performed or assessed with clinical judgement     Vision Patient Visual Report: No change from baseline            Cognition Arousal/Alertness:  Awake/alert Behavior During Therapy: Flat affect Overall Cognitive Status: History of cognitive impairments - at baseline                                                General Comments  (Pt required increased time to complete tasks due to cognitive impairments. Family present and helpful throughout session.)    Pertinent Vitals/ Pain       Pain Assessment Pain Assessment: Faces Faces Pain Scale: No hurt         Frequency  Min 1X/week        Progress Toward Goals  OT Goals(current goals can now be found in the care plan section)  Progress towards OT goals: Progressing toward goals     Plan Discharge plan remains appropriate;Frequency needs to be updated       AM-PAC OT "6 Clicks" Daily Activity     Outcome Measure   Help from another person eating meals?: A Little Help from another person taking care of personal grooming?: A Lot Help from another person toileting, which includes using toliet, bedpan, or urinal?: Total Help from another person bathing (including washing, rinsing, drying)?: Total Help from another person to put on and taking off regular upper body clothing?: A Lot Help from another person to put on and taking off regular lower body clothing?: Total 6 Click Score: 10    End of Session    OT Visit Diagnosis: Repeated falls (R29.6);Muscle weakness (generalized) (M62.81)   Activity Tolerance Treatment limited secondary to agitation   Patient Left with call bell/phone within reach;in chair;Other (comment) (chair  alarm belt)   Nurse Communication Mobility status        Time: 1426-1440 OT Time Calculation (min): 14 min  Charges: OT General Charges $OT Visit: 1 Visit OT Treatments $Therapeutic Activity: 8-22 mins  Jackquline Denmark, MS, OTR/L , CBIS ascom [phone]  01/31/23, 3:26 PM

## 2023-01-31 NOTE — TOC Progression Note (Signed)
Transition of Care [facility]) - Progression Note    Patient Details  Name: Leah Ritter MRN: [id number] Date of Birth: 10/10/1950  [facility]) CM/SW Contact  Allena Katz, LCSW Phone Number: 01/31/2023, 1:18 PM  Clinical Narrative:   CSW spoke with Pam at [facility] who states her nurse will come out tomorrow to do an assessment. She states if they are able to accept patient back she can come Wednesday.     Expected Discharge Plan: Memory Care (with hospice) Barriers to Discharge: Continued Medical Work up  Expected Discharge Plan and Services     Post Acute Care Choice: Resumption of Svcs/PTA Provider Living arrangements for the past 2 months: Assisted Living Facility (Memory Care side)                                       Social Determinants of Health (SDOH) Interventions SDOH Screenings   Food Insecurity: No Food Insecurity (07/16/2022)  Housing: Low Risk  (07/16/2022)  Transportation Needs: No Transportation Needs (07/16/2022)  Utilities: Not At Risk (07/16/2022)  Alcohol Screen: Low Risk  (09/05/2018)  Tobacco Use: Low Risk  (01/25/2023)    Readmission Risk Interventions     No data to display

## 2023-01-31 NOTE — Progress Notes (Signed)
[facility] Progress Note  Patient: Leah Ritter    JWJ:[id number]  DOA: 01/21/2023     Date of Service: the patient was seen and examined on 01/31/2023  Chief Complaint  Patient presents with   Altered Mental Status   Brief hospital course: PMH of type II DM, seizures, CVA, chronic diastolic CHF, OSA, CKD 3a, HTN, HLD, chronic A-fib presented to hospital with complaints of poor p.o. intake and lethargy.  Workup in the ED showed blood sugars of 622 with hypothermia and hypernatremia concerning for hyperglycemic nonketotic hyperosmolar state.  Admitted to the ICU. Treated with IV fluids and IV insulin therapy.  Transfer to hospital service after completion of the IV insulin protocol. Patient had ongoing encephalopathy with poor p.o. intake with frequent hypoglycemic spells. On 5/3 mentation improving significantly, able to answer questions appropriately as well as able to eat adequately and able to ambulate in the hallway with physical therapy.   The patient has declined markedly over the past months with regard to function and mentation.    Staph epi grown from 1/2 bottles on 01/21/2023. Repeat blood cultures negative x 5 days.    The plan is for the patient to return to [facility] with hospice services. Palliative care does not feel that she will meet criteria for residential hospice.     Assessment and Plan: Hyperosmolar hyperglycemic nonketotic state. Blood sugars was severely elevated with hyponatremia at the time of admission. Treated with IV insulin and IV fluid. Currently on basal bolus regimen as well as Premeal coverage. Due to hypoglycemic events Lantus dose was reduced to 10 units only and Premeal coverage was discontinued.  She continues to have poor p.o. intake as well as ongoing hypoglycemia therefore currently only on sliding scale insulin. Continue every 4 hour coverage.   Add D50 as needed.   Add Glucerna as well.  Will initiate calorie count while awaiting  clarity on goals of care. Monitor for now. Glucoses have been improved over the past 24 hours ranging from 129-260.  Continue Lantus as is. Follow with FSBS and SSI.    Acute kidney injury on CKD stage IIIa Resolved. Creatinine is 1.13 on 01/30/2023 Baseline GFR around 50s serum creatinine 1-1.1. On admission serum creatinine 2.23. Treated with IV hydration. Will monitor.   Hypokalemia. Potassium 4.5 Hypernatremia. Sodium 141 Resolved Sodium potassium corrected earlier.  At peak sodium was 165. Patient is not allowing blood work in the last 24 hours. Will continue to monitor and encourage patient to allow blood work.   Community-acquired pneumonia. Patient completed 5-day antibiotic therapy.  Currently no evidence of acute infection.   Staph epi bacteria and blood. Contamination. Repeat cultures so far negative x 5 days. Will monitor.   Acute delirium secondary to metabolic encephalopathy in the setting of underlying dementia. Able to follow commands able to ambulate in the hallway.  Able to answer questions appropriately. Will continue Seroquel. Will resume Remeron for now. Mentation of to be improving. Slowly improving.      Chronic diastolic CHF. Appears to be euvolemic. Diuretics on hold. Monitor volume status.   History of OSA. Not on any CPAP therapy.   Paroxysmal A-fib. Not on any anticoagulation right now. Will monitor.   HTN. Blood pressure stable. Holding medications.   HLD. Due to poor p.o. intake currently holding medication.   Goals of care conversation. From the chart review it appears that the patient is active with [facility] at reach ALF memory care unit. At baseline patient is able  to walk with assistance and speaks some. Patient continues to exhibit poor p.o. intake. Discussed with daughter on 5/2.  Patient is on hospice at the facility for support and assistance rather than for end-of-life care.  She signs up paper to enroll in hospice on  her return back to the facility.  Daughter currently prefers patient's medical conditions to be treated. Daughter understands that the patient's condition can be recurrent if her nutritional intake is inconsistent. Patient did eat well on 5/1 at dinnertime but not much intake today.  Speech is still incoherent.  I still suspect that the patient's prognosis will remain guarded going forward. Discussed that I will be observing her oral intake today and will have further conversation with regards to nutritional needs tomorrow on 5/3. Also informed her that I will be consulting palliative care for further clarity on goals of care. Met with daughter at bedside later in the evening again to discuss this and decide on the goal of care pathway for the patient.  Answered questions about possible future trajectory for the patient.   Plan is to discharge to [facility] with Hospice as she is unlikely to meet criteria for residential hospice.   Body mass index is 27.64 kg/m.  Nutrition Problem: Inadequate oral intake Etiology: poor appetite Interventions: Interventions: Ensure Enlive (each supplement provides 350kcal and 20 grams of protein), MVI, Magic cup, Calorie Count    Diet: Diabetic diet DVT Prophylaxis: Subcutaneous Heparin    Advance goals of care discussion: DNR  Family Communication: family was present at bedside, at the time of interview.  The pt provided permission to discuss medical plan with the family. Opportunity was given to ask question and all questions were answered satisfactorily.   Disposition:  Pt is from SNF, admitted with uncontrolled diabetes, stable to discharge, awaiting for evaluation by send facility.  TOC is following, most likely patient will be discharged on Wednesday, 02/02/2023 as per TOC.   Subjective: No significant events overnight, patient was sitting on the recliner, eating breakfast and listening music.  Family was at bedside.  Patient did not offer any  complaints.  Physical Exam: General: NAD, lying comfortably Appear in no distress, affect appropriate Eyes: PERRLA ENT: Oral Mucosa Clear, moist  Neck: no JVD,  Cardiovascular: S1 and S2 Present, no Murmur,  Respiratory: good respiratory effort, Bilateral Air entry equal and Decreased, no Crackles, no wheezes Abdomen: Bowel Sound present, Soft and no tenderness,  Skin: no rashes Extremities: no Pedal edema, no calf tenderness Neurologic: without any new focal findings Gait not checked due to patient safety concerns  Vitals:   01/30/23 1953 01/31/23 0339 01/31/23 0853 01/31/23 1147  BP: (!) 161/83 (!) 159/70 128/74 96/60  Pulse: 70 85 88 61  Resp: 20 20 16 18   Temp: 98 F (36.7 C) 99.2 F (37.3 C) 98.2 F (36.8 C) 98.4 F (36.9 C)  TempSrc:  Oral Oral Oral  SpO2: 100% 95% 98% 97%  Weight:      Height:        Intake/Output Summary (Last 24 hours) at 01/31/2023 1517 Last data filed at 01/31/2023 0600 Gross per 24 hour  Intake 240 ml  Output 500 ml  Net -260 ml   Filed Weights   01/21/23 1826  Weight: 84.9 kg    Data Reviewed: I have personally reviewed and interpreted daily labs, tele strips, imagings as discussed above. I reviewed all nursing notes, pharmacy notes, vitals, pertinent old records I have discussed plan of care as described  above with RN and patient/family.  CBC: Recent Labs  Lab 01/27/23 0852 01/28/23 0631 01/30/23 0900  WBC 6.4 6.9 6.5  NEUTROABS  --   --  3.6  HGB 12.0 11.8* 12.2  HCT 37.0 37.1 37.3  MCV 89.6 90.3 89.4  PLT 147* 175 148*   Basic Metabolic Panel: Recent Labs  Lab 01/27/23 0852 01/28/23 0631 01/29/23 0611 01/30/23 0900 01/31/23 0551 01/31/23 0553  NA 144 142 142 141  --  142  K 3.9 3.5 3.7 4.5  --  3.9  CL 112* 108 110 108  --  110  CO2 24 27 23 27   --  23  GLUCOSE 116* 160* 140* 192*  --  160*  BUN 24* 22 19 21   --  22  CREATININE 1.03* 1.09* 1.00 1.13*  --  0.98  CALCIUM 9.1 8.8* 9.1 9.4  --  9.0  MG 2.2 2.1 2.0   --  2.0  --   PHOS  --   --   --   --  2.6  --     Studies: No results found.  Scheduled Meds:  Chlorhexidine Gluconate Cloth  6 each Topical Daily   feeding supplement  237 mL Oral TID BM   heparin  5,000 Units Subcutaneous Q8H   insulin aspart  0-15 Units Subcutaneous TID WC   insulin aspart  0-5 Units Subcutaneous QHS   mirabegron ER  25 mg Oral Daily   mirtazapine  15 mg Oral QHS   multivitamin with minerals  1 tablet Oral Daily   nystatin  5 mL Oral QID   mouth rinse  15 mL Mouth Rinse 4 times per day   QUEtiapine  25 mg Oral QHS   Continuous Infusions: PRN Meds: dextrose, LORazepam, mouth rinse  Time spent: 35 minutes  Author: Gillis Santa. MD [facility] 01/31/2023 3:17 PM  To reach On-call, see care teams to locate the attending and reach out to them via [email or web address]. If 7PM-7AM, please contact night-coverage If you still have difficulty reaching the attending provider, please page the [facility] (Director on Call) for [facility] on amion for assistance.

## 2023-01-31 NOTE — Progress Notes (Signed)
Physical Therapy Treatment Patient Details Name: Leah Ritter MRN: [id number] DOB: 05/11/1951 Today's Date: 01/31/2023   History of Present Illness 72yo female presented to ER secondary to AMS, lethargy, unable to eat/drink; admitted for management of non-ketotic hyperglycemia    PT Comments    Pt received in bed late am. Family present and willing to assist with encouraging pt OOB. Pt required increased time with each task due to cognitive impairments at baseline. Good sitting balance with feet supported for several minutes at EOB. After several attempts, pt able to transfer to standing with HHA of 2 and side step to bedside chair. Pt positioned to comfort for breakfast and able to self feed. Therapist returned several times to progress to gait training, however pt N/A x 2 and was experiencing low BP upon final attempt.   Recommendations for follow up therapy are one component of a multi-disciplinary discharge planning process, led by the attending physician.  Recommendations may be updated based on patient status, additional functional criteria and insurance authorization.  Follow Up Recommendations  Can patient physically be transported by private vehicle: No    Assistance Recommended at Discharge Frequent or constant Supervision/Assistance  Patient can return home with the following A little help with walking and/or transfers;A little help with bathing/dressing/bathroom;Assistance with cooking/housework;Direct supervision/assist for medications management;Direct supervision/assist for financial management;Assist for transportation;Help with stairs or ramp for entrance   Equipment Recommendations  Other (comment) (defer to next level of care)    Recommendations for Other Services       Precautions / Restrictions Precautions Precautions: Fall Restrictions Weight Bearing Restrictions: No     Mobility  Bed Mobility Overal bed mobility: Needs Assistance Bed Mobility: Supine to  Sit Rolling: Min assist   Supine to sit: Mod assist, HOB elevated (Mostly due to dementia level)     General bed mobility comments: pt is able to perform in/out of bed with min assist when agreeable and following commands. pt does occasionally get resistive if she doesnt want to get OOB when she doesnt want to    Transfers Overall transfer level: Needs assistance Equipment used: Rolling walker (2 wheels), 1 person hand held assist, None Transfers: Sit to/from Stand Sit to Stand: Min guard           General transfer comment:  (ModA needed if pt does not understand desired task, requiring increased time and repeated verbal/tactile cues)    Ambulation/Gait               General Gait Details:  (Returned to room several times for gait progression, pt not available)   Optometrist    Modified Rankin (Stroke Patients Only)       Balance                                            Cognition Arousal/Alertness: Awake/alert Behavior During Therapy: Flat affect Overall Cognitive Status: History of cognitive impairments - at baseline                                 General Comments: pt requires constant cueing to stay on task and to perform desired task requested of her        Exercises  General Comments General comments (skin integrity, edema, etc.):  (Pt required increased time to complete tasks due to cognitive impairments. Family present and helpful throughout session.)      Pertinent Vitals/Pain Pain Assessment Pain Assessment: No/denies pain    Home Living                          Prior Function            PT Goals (current goals can now be found in the care plan section) Acute Rehab PT Goals Patient Stated Goal: none stated    Frequency    Min 3X/week      PT Plan Current plan remains appropriate    Co-evaluation              AM-PAC PT "6 Clicks"  Mobility   Outcome Measure  Help needed turning from your back to your side while in a flat bed without using bedrails?: A Little Help needed moving from lying on your back to sitting on the side of a flat bed without using bedrails?: A Little Help needed moving to and from a bed to a chair (including a wheelchair)?: A Little Help needed standing up from a chair using your arms (e.g., wheelchair or bedside chair)?: A Little Help needed to walk in hospital room?: A Little Help needed climbing 3-5 steps with a railing? : A Lot 6 Click Score: 17    End of Session Equipment Utilized During Treatment: Gait belt Activity Tolerance: Patient tolerated treatment well;Other (comment) (Cognition limiting, but pt pleasant) Patient left: in chair;with call bell/phone within reach;with family/visitor present;with nursing/sitter in room Nurse Communication: Mobility status PT Visit Diagnosis: Muscle weakness (generalized) (M62.81);Difficulty in walking, not elsewhere classified (R26.2)     Time: 1610-9604 PT Time Calculation (min) (ACUTE ONLY): 33 min  Charges:  $Therapeutic Activity: 23-37 mins                    Zadie Cleverly, PTA   Jannet Askew 01/31/2023, 3:06 PM

## 2023-02-01 DIAGNOSIS — R739 Hyperglycemia, unspecified: Secondary | ICD-10-CM | POA: Diagnosis not present

## 2023-02-01 LAB — GLUCOSE, CAPILLARY
Glucose-Capillary: 117 mg/dL — ABNORMAL HIGH (ref 70–99)
Glucose-Capillary: 288 mg/dL — ABNORMAL HIGH (ref 70–99)
Glucose-Capillary: 174 mg/dL — ABNORMAL HIGH (ref 70–99)
Glucose-Capillary: 146 mg/dL — ABNORMAL HIGH (ref 70–99)

## 2023-02-01 MED ORDER — BISACODYL 5 MG PO TBEC
10.0000 mg | DELAYED_RELEASE_TABLET | Freq: Every day | ORAL | Status: DC | PRN
  Administered 2023-02-02: 10 mg via ORAL
  Filled 2023-02-01 (×2): qty 2

## 2023-02-01 MED ORDER — POLYETHYLENE GLYCOL 3350 17 G PO PACK
17.0000 g | PACK | Freq: Two times a day (BID) | ORAL | Status: DC
  Administered 2023-02-01 – 2023-02-03 (×5): 17 g via ORAL
  Filled 2023-02-01 (×5): qty 1

## 2023-02-01 MED ORDER — BISACODYL 5 MG PO TBEC
10.0000 mg | DELAYED_RELEASE_TABLET | Freq: Once | ORAL | Status: AC
  Administered 2023-02-01: 10 mg via ORAL
  Filled 2023-02-01: qty 2

## 2023-02-01 MED ORDER — BISACODYL 10 MG RE SUPP
10.0000 mg | Freq: Every day | RECTAL | Status: DC | PRN
  Administered 2023-02-01: 10 mg via RECTAL
  Filled 2023-02-01 (×2): qty 1

## 2023-02-01 MED ORDER — ENOXAPARIN SODIUM 40 MG/0.4ML IJ SOSY
40.0000 mg | PREFILLED_SYRINGE | Freq: Every day | INTRAMUSCULAR | Status: DC
  Administered 2023-02-01 – 2023-02-02 (×2): 40 mg via SUBCUTANEOUS
  Filled 2023-02-01 (×2): qty 0.4

## 2023-02-01 NOTE — Progress Notes (Signed)
Patient has not had BM since 5/1. Made MD aware. New orders for Dulcolax once, Miralax, and suppository. Dulcolax and Miralax given this morning with no results. Later this afternoon suppository given with no results as of yet.

## 2023-02-01 NOTE — Plan of Care (Signed)
  Problem: Education: Goal: Knowledge of General Education information will improve Description: Including pain rating scale, medication(s)/side effects and non-pharmacologic comfort measures 02/01/2023 2106 by Raeford Razor, RN Outcome: Not Progressing 02/01/2023 2104 by Raeford Razor, RN Outcome: Not Progressing   Problem: Health Behavior/Discharge Planning: Goal: Ability to manage health-related needs will improve 02/01/2023 2106 by Raeford Razor, RN Outcome: Not Progressing 02/01/2023 2104 by Raeford Razor, RN Outcome: Not Progressing   Problem: Clinical Measurements: Goal: Ability to maintain clinical measurements within normal limits will improve Outcome: Not Progressing Goal: Will remain free from infection Outcome: Progressing Goal: Diagnostic test results will improve Outcome: Progressing Goal: Respiratory complications will improve Outcome: Not Applicable Goal: Cardiovascular complication will be avoided Outcome: Not Applicable

## 2023-02-01 NOTE — TOC Progression Note (Addendum)
Transition of Care [facility]) - Progression Note    Patient Details  Name: Leah Ritter MRN: [id number] Date of Birth: [birth year]  [facility]) CM/SW Contact  Allena Katz, LCSW Phone Number: 02/01/2023, 1:29 PM  Clinical Narrative:   CSW left message with Pam at [facility] to see what time they would be coming to assess patient for return.   2:06pm  [facility] will come to assess patient around 3:30pm today.    Expected Discharge Plan: Memory Care (with hospice) Barriers to Discharge: Continued Medical Work up  Expected Discharge Plan and Services     Post Acute Care Choice: Resumption of Svcs/PTA Provider Living arrangements for the past 2 months: Assisted Living Facility (Memory Care side)                                       Social Determinants of Health (SDOH) Interventions SDOH Screenings   Food Insecurity: No Food Insecurity (07/16/2022)  Housing: Low Risk  (07/16/2022)  Transportation Needs: No Transportation Needs (07/16/2022)  Utilities: Not At Risk (07/16/2022)  Alcohol Screen: Low Risk  (09/05/2018)  Tobacco Use: Low Risk  (01/25/2023)    Readmission Risk Interventions     No data to display
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
## 2023-02-01 NOTE — Progress Notes (Signed)
[facility] Progress Note  Patient: Leah Ritter    BJY:[id number]  DOA: 01/21/2023     Date of Service: the patient was seen and examined on 02/01/2023  Chief Complaint  Patient presents with   Altered Mental Status   Brief hospital course: PMH of type II DM, seizures, CVA, chronic diastolic CHF, OSA, CKD 3a, HTN, HLD, chronic A-fib presented to hospital with complaints of poor p.o. intake and lethargy.  Workup in the ED showed blood sugars of 622 with hypothermia and hypernatremia concerning for hyperglycemic nonketotic hyperosmolar state.  Admitted to the ICU. Treated with IV fluids and IV insulin therapy.  Transfer to hospital service after completion of the IV insulin protocol. Patient had ongoing encephalopathy with poor p.o. intake with frequent hypoglycemic spells. On 5/3 mentation improving significantly, able to answer questions appropriately as well as able to eat adequately and able to ambulate in the hallway with physical therapy.   The patient has declined markedly over the past months with regard to function and mentation.    Staph epi grown from 1/2 bottles on 01/21/2023. Repeat blood cultures negative x 5 days.    The plan is for the patient to return to [facility] with hospice services. Palliative care does not feel that she will meet criteria for residential hospice.     Assessment and Plan: Hyperosmolar hyperglycemic nonketotic state. Blood sugars was severely elevated with hyponatremia at the time of admission. Treated with IV insulin and IV fluid. Currently on basal bolus regimen as well as Premeal coverage. Due to hypoglycemic events Lantus dose was reduced to 10 units only and Premeal coverage was discontinued.  She continues to have poor p.o. intake as well as ongoing hypoglycemia therefore currently only on sliding scale insulin. Continue every 4 hour coverage.   Add D50 as needed.   Add Glucerna as well.  Will initiate calorie count while awaiting  clarity on goals of care. Monitor for now. Glucoses have been improved over the past 24 hours ranging from 129-260.  Continue Lantus as is. Follow with FSBS and SSI.    Acute kidney injury on CKD stage IIIa Resolved. Creatinine is 1.13 on 01/30/2023 Baseline GFR around 50s serum creatinine 1-1.1. On admission serum creatinine 2.23. Treated with IV hydration. Will monitor.   Hypokalemia. Potassium 4.5 Hypernatremia. Sodium 141 Resolved Sodium potassium corrected earlier.  At peak sodium was 165. Patient is not allowing blood work in the last 24 hours. Will continue to monitor and encourage patient to allow blood work.   Community-acquired pneumonia. Patient completed 5-day antibiotic therapy.  Currently no evidence of acute infection.   Staph epi bacteria and blood. Contamination. Repeat cultures so far negative x 5 days. Will monitor.   Acute delirium secondary to metabolic encephalopathy in the setting of underlying dementia. Able to follow commands able to ambulate in the hallway.  Able to answer questions appropriately. Will continue Seroquel. Will resume Remeron for now. Mentation of to be improving. Slowly improving.      Chronic diastolic CHF. Appears to be euvolemic. Diuretics on hold. Monitor volume status.   History of OSA. Not on any CPAP therapy.   Paroxysmal A-fib. Not on any anticoagulation right now. Will monitor.   HTN. Blood pressure stable. Holding medications.   HLD. Due to poor p.o. intake currently holding medication.   Goals of care conversation. From the chart review it appears that the patient is active with [facility]. At baseline patient is able  to walk with assistance and speaks some. Patient continues to exhibit poor p.o. intake. Discussed with daughter on 5/2.  Patient is on hospice at the facility for support and assistance rather than for end-of-life care.  She signs up paper to enroll in hospice on  her return back to the facility.  Daughter currently prefers patient's medical conditions to be treated. Daughter understands that the patient's condition can be recurrent if her nutritional intake is inconsistent. Patient did eat well on 5/1 at dinnertime but not much intake today.  Speech is still incoherent.  I still suspect that the patient's prognosis will remain guarded going forward. Discussed that I will be observing her oral intake today and will have further conversation with regards to nutritional needs tomorrow on 5/3. Also informed her that I will be consulting palliative care for further clarity on goals of care. Met with daughter at bedside later in the evening again to discuss this and decide on the goal of care pathway for the patient.  Answered questions about possible future trajectory for the patient.   Plan is to discharge to [facility] with Hospice as she is unlikely to meet criteria for residential hospice.   Body mass index is 27.64 kg/m.  Nutrition Problem: Inadequate oral intake Etiology: poor appetite Interventions: Interventions: Ensure Enlive (each supplement provides 350kcal and 20 grams of protein), MVI, Magic cup, Calorie Count    Diet: Diabetic diet DVT Prophylaxis: Subcutaneous Heparin    Advance goals of care discussion: DNR  Family Communication: family was present at bedside, at the time of interview.  The pt provided permission to discuss medical plan with the family. Opportunity was given to ask question and all questions were answered satisfactorily.   Disposition:  Pt is from SNF, admitted with uncontrolled diabetes, stable to discharge, awaiting for evaluation by send facility.  TOC is following, most likely patient will be discharged on Wednesday, 02/02/2023 as per TOC.   Subjective: No significant events overnight, patient was sleepy comfortably in the bed, she was mumbling unable to understand, had mittens and hands could be due to  confusion. AAO x 1, she was able to tell me her name and dozing off.   Physical Exam: General: NAD, lying comfortably Appear in no distress, affect appropriate Eyes: closed, sleepy ENT: Oral Mucosa Clear, moist  Neck: no JVD,  Cardiovascular: S1 and S2 Present, no Murmur,  Respiratory: good respiratory effort, Bilateral Air entry equal and Decreased, no Crackles, no wheezes Abdomen: Bowel Sound present, Soft and no tenderness,  Skin: no rashes Extremities: no Pedal edema, no calf tenderness Neurologic: without any new focal findings Gait not checked due to patient safety concerns  Vitals:   01/31/23 1626 01/31/23 2051 02/01/23 0457 02/01/23 0810  BP: 108/64 126/74 132/67 132/73  Pulse: 92 76 81 75  Resp: 16 18 16 20   Temp: 98.5 F (36.9 C) (!) 97.3 F (36.3 C) 97.6 F (36.4 C) 97.7 F (36.5 C)  TempSrc: Oral Oral Oral Oral  SpO2: 97% 100% 100% 100%  Weight:      Height:        Intake/Output Summary (Last 24 hours) at 02/01/2023 1606 Last data filed at 02/01/2023 1010 Gross per 24 hour  Intake 0 ml  Output 400 ml  Net -400 ml   Filed Weights   01/21/23 1826  Weight: 84.9 kg    Data Reviewed: I have personally reviewed and interpreted daily labs, tele strips, imagings as discussed above. I reviewed all nursing notes,  pharmacy notes, vitals, pertinent old records I have discussed plan of care as described above with RN and patient/family.  CBC: Recent Labs  Lab 01/27/23 0852 01/28/23 0631 01/30/23 0900  WBC 6.4 6.9 6.5  NEUTROABS  --   --  3.6  HGB 12.0 11.8* 12.2  HCT 37.0 37.1 37.3  MCV 89.6 90.3 89.4  PLT 147* 175 148*   Basic Metabolic Panel: Recent Labs  Lab 01/27/23 0852 01/28/23 0631 01/29/23 0611 01/30/23 0900 01/31/23 0551 01/31/23 0553  NA 144 142 142 141  --  142  K 3.9 3.5 3.7 4.5  --  3.9  CL 112* 108 110 108  --  110  CO2 24 27 23 27   --  23  GLUCOSE 116* 160* 140* 192*  --  160*  BUN 24* 22 19 21   --  22  CREATININE 1.03* 1.09*  1.00 1.13*  --  0.98  CALCIUM 9.1 8.8* 9.1 9.4  --  9.0  MG 2.2 2.1 2.0  --  2.0  --   PHOS  --   --   --   --  2.6  --     Studies: No results found.  Scheduled Meds:  Chlorhexidine Gluconate Cloth  6 each Topical Daily   enoxaparin (LOVENOX) injection  40 mg Subcutaneous QHS   feeding supplement  237 mL Oral TID BM   insulin aspart  0-15 Units Subcutaneous TID WC   insulin aspart  0-5 Units Subcutaneous QHS   mirabegron ER  25 mg Oral Daily   mirtazapine  15 mg Oral QHS   multivitamin with minerals  1 tablet Oral Daily   nystatin  5 mL Oral QID   mouth rinse  15 mL Mouth Rinse 4 times per day   polyethylene glycol  17 g Oral BID   QUEtiapine  25 mg Oral QHS   Continuous Infusions: PRN Meds: bisacodyl, bisacodyl, dextrose, LORazepam, mouth rinse  Time spent: 35 minutes  Author: Gillis Santa. MD [facility] 02/01/2023 4:06 PM  To reach On-call, see care teams to locate the attending and reach out to them via [email or web address]. If 7PM-7AM, please contact night-coverage If you still have difficulty reaching the attending provider, please page the [facility] (Director on Call) for [facility] on amion for assistance.

## 2023-02-02 DIAGNOSIS — R739 Hyperglycemia, unspecified: Secondary | ICD-10-CM | POA: Diagnosis not present

## 2023-02-02 LAB — GLUCOSE, CAPILLARY
Glucose-Capillary: 149 mg/dL — ABNORMAL HIGH (ref 70–99)
Glucose-Capillary: 126 mg/dL — ABNORMAL HIGH (ref 70–99)
Glucose-Capillary: 320 mg/dL — ABNORMAL HIGH (ref 70–99)
Glucose-Capillary: 103 mg/dL — ABNORMAL HIGH (ref 70–99)

## 2023-02-02 MED ORDER — LORAZEPAM 0.5 MG PO TABS: 0.5000 mg | ORAL_TABLET | ORAL | 0 refills | Status: DC | PRN

## 2023-02-02 MED ORDER — NYSTATIN 100000 UNIT/ML MT SUSP: 5.0000 mL | Freq: Four times a day (QID) | OROMUCOSAL | 0 refills | Status: DC

## 2023-02-02 MED ORDER — BISACODYL 5 MG PO TBEC: 10.0000 mg | DELAYED_RELEASE_TABLET | Freq: Every day | ORAL | 0 refills | Status: AC | PRN

## 2023-02-02 MED ORDER — POLYETHYLENE GLYCOL 3350 17 G PO PACK: 17.0000 g | PACK | Freq: Two times a day (BID) | ORAL | 0 refills | Status: DC

## 2023-02-02 NOTE — Progress Notes (Signed)
Patient pleasant and cooperative throughout shift. No behaviors noted.

## 2023-02-02 NOTE — Progress Notes (Signed)
[facility] Progress Note  Patient: Leah Ritter    ZOX:[id number]  DOA: 01/21/2023     Date of Service: the patient was seen and examined on 02/02/2023  Chief Complaint  Patient presents with   Altered Mental Status   Brief hospital course: PMH of type II DM, seizures, CVA, chronic diastolic CHF, OSA, CKD 3a, HTN, HLD, chronic A-fib presented to hospital with complaints of poor p.o. intake and lethargy.  Workup in the ED showed blood sugars of 622 with hypothermia and hypernatremia concerning for hyperglycemic nonketotic hyperosmolar state.  Admitted to the ICU. Treated with IV fluids and IV insulin therapy.  Transfer to hospital service after completion of the IV insulin protocol. Patient had ongoing encephalopathy with poor p.o. intake with frequent hypoglycemic spells. On 5/3 mentation improving significantly, able to answer questions appropriately as well as able to eat adequately and able to ambulate in the hallway with physical therapy.   The patient has declined markedly over the past months with regard to function and mentation.    Staph epi grown from 1/2 bottles on 01/21/2023. Repeat blood cultures negative x 5 days.    The plan is for the patient to return to [facility] with hospice services. Palliative care does not feel that she will meet criteria for residential hospice.     Assessment and Plan: Hyperosmolar hyperglycemic nonketotic state. Blood sugars was severely elevated with hyponatremia at the time of admission. Treated with IV insulin and IV fluid. Currently on basal bolus regimen as well as Premeal coverage. Due to hypoglycemic events Lantus dose was reduced to 10 units only and Premeal coverage was discontinued.  She continues to have poor p.o. intake as well as ongoing hypoglycemia therefore currently only on sliding scale insulin. Continue every 4 hour coverage.   Add D50 as needed.   Add Glucerna as well.  Will initiate calorie count while awaiting  clarity on goals of care. Monitor for now. Glucoses have been improved over the past 24 hours ranging from 129-260.  Continue Lantus as is. Follow with FSBS and SSI.    Acute kidney injury on CKD stage IIIa Resolved. Creatinine is 1.13 on 01/30/2023 Baseline GFR around 50s serum creatinine 1-1.1. On admission serum creatinine 2.23. Treated with IV hydration. Will monitor.   Hypokalemia. Potassium 4.5 Hypernatremia. Sodium 141 Resolved Sodium potassium corrected earlier.  At peak sodium was 165. Patient is not allowing blood work in the last 24 hours. Will continue to monitor and encourage patient to allow blood work.   Community-acquired pneumonia. Patient completed 5-day antibiotic therapy.  Currently no evidence of acute infection.   Staph epi bacteria and blood. Contamination. Repeat cultures so far negative x 5 days. Will monitor.   Acute delirium secondary to metabolic encephalopathy in the setting of underlying dementia. Able to follow commands able to ambulate in the hallway.  Able to answer questions appropriately. Will continue Seroquel. Will resume Remeron for now. Mentation of to be improving. Slowly improving.      Chronic diastolic CHF. Appears to be euvolemic. Diuretics on hold. Monitor volume status.   History of OSA. Not on any CPAP therapy.   Paroxysmal A-fib. Not on any anticoagulation right now. Will monitor.   HTN. Blood pressure stable. Holding medications.   HLD. Due to poor p.o. intake currently holding medication.   Goals of care conversation. From the chart review it appears that the patient is active with [facility]. At baseline patient is able  to walk with assistance and speaks some. Patient continues to exhibit poor p.o. intake. Discussed with daughter on 5/2.  Patient is on hospice at the facility for support and assistance rather than for end-of-life care.  She signs up paper to enroll in hospice on  her return back to the facility.  Daughter currently prefers patient's medical conditions to be treated. Daughter understands that the patient's condition can be recurrent if her nutritional intake is inconsistent. Patient did eat well on 5/1 at dinnertime but not much intake today.  Speech is still incoherent.  I still suspect that the patient's prognosis will remain guarded going forward. Discussed that I will be observing her oral intake today and will have further conversation with regards to nutritional needs tomorrow on 5/3. Also informed her that I will be consulting palliative care for further clarity on goals of care. Met with daughter at bedside later in the evening again to discuss this and decide on the goal of care pathway for the patient.  Answered questions about possible future trajectory for the patient.   Plan is to discharge to [facility] with Hospice as she is unlikely to meet criteria for residential hospice.   Body mass index is 27.64 kg/m.  Nutrition Problem: Inadequate oral intake Etiology: poor appetite Interventions: Interventions: Ensure Enlive (each supplement provides 350kcal and 20 grams of protein), MVI, Magic cup, Calorie Count    Diet: Diabetic diet DVT Prophylaxis: Subcutaneous Heparin    Advance goals of care discussion: DNR  Family Communication: family was present at bedside, at the time of interview.  The pt provided permission to discuss medical plan with the family. Opportunity was given to ask question and all questions were answered satisfactorily.   Disposition:  Pt is from SNF, admitted with uncontrolled diabetes, stable to discharge, awaiting for evaluation by send facility.  TOC is following, most likely patient will be discharged on Thursday, 02/03/2023 as per TOC.   Subjective: No significant events overnight, patient was awake, AO x 1, mumbling, trying to talk which does not make any sense.  Seems to be resting comfortably, did not offer  any complaints.    Physical Exam: General: NAD, lying comfortably Appear in no distress, affect confused and altered Eyes: closed, sleepy ENT: Oral Mucosa Clear, moist  Neck: no JVD,  Cardiovascular: S1 and S2 Present, no Murmur,  Respiratory: good respiratory effort, Bilateral Air entry equal and Decreased, no Crackles, no wheezes Abdomen: Bowel Sound present, Soft and no tenderness,  Skin: no rashes Extremities: no Pedal edema, no calf tenderness Neurologic: without any new focal findings Gait not checked due to patient safety concerns  Vitals:   02/01/23 1608 02/01/23 2135 02/02/23 0520 02/02/23 0828  BP: 138/76 (!) 95/40 (!) 147/86 (!) 151/81  Pulse: 61 77 76 68  Resp: 20 16 18 16   Temp: 98.2 F (36.8 C) 98.2 F (36.8 C) 98 F (36.7 C) 98.2 F (36.8 C)  TempSrc: Oral   Oral  SpO2: 100% 100% 97% 100%  Weight:      Height:        Intake/Output Summary (Last 24 hours) at 02/02/2023 1444 Last data filed at 02/01/2023 1900 Gross per 24 hour  Intake 120 ml  Output --  Net 120 ml   Filed Weights   01/21/23 1826  Weight: 84.9 kg    Data Reviewed: I have personally reviewed and interpreted daily labs, tele strips, imagings as discussed above. I reviewed all nursing notes, pharmacy notes, vitals, pertinent old  records I have discussed plan of care as described above with RN and patient/family.  CBC: Recent Labs  Lab 01/27/23 0852 01/28/23 0631 01/30/23 0900  WBC 6.4 6.9 6.5  NEUTROABS  --   --  3.6  HGB 12.0 11.8* 12.2  HCT 37.0 37.1 37.3  MCV 89.6 90.3 89.4  PLT 147* 175 148*   Basic Metabolic Panel: Recent Labs  Lab 01/27/23 0852 01/28/23 0631 01/29/23 0611 01/30/23 0900 01/31/23 0551 01/31/23 0553  NA 144 142 142 141  --  142  K 3.9 3.5 3.7 4.5  --  3.9  CL 112* 108 110 108  --  110  CO2 24 27 23 27   --  23  GLUCOSE 116* 160* 140* 192*  --  160*  BUN 24* 22 19 21   --  22  CREATININE 1.03* 1.09* 1.00 1.13*  --  0.98  CALCIUM 9.1 8.8* 9.1 9.4  --   9.0  MG 2.2 2.1 2.0  --  2.0  --   PHOS  --   --   --   --  2.6  --     Studies: No results found.  Scheduled Meds:  Chlorhexidine Gluconate Cloth  6 each Topical Daily   enoxaparin (LOVENOX) injection  40 mg Subcutaneous QHS   feeding supplement  237 mL Oral TID BM   insulin aspart  0-15 Units Subcutaneous TID WC   insulin aspart  0-5 Units Subcutaneous QHS   mirabegron ER  25 mg Oral Daily   mirtazapine  15 mg Oral QHS   multivitamin with minerals  1 tablet Oral Daily   nystatin  5 mL Oral QID   polyethylene glycol  17 g Oral BID   QUEtiapine  25 mg Oral QHS   Continuous Infusions: PRN Meds: bisacodyl, bisacodyl, dextrose, LORazepam, mouth rinse  Time spent: 35 minutes  Author: Gillis Santa. MD [facility] 02/02/2023 2:44 PM  To reach On-call, see care teams to locate the attending and reach out to them via [email or web address]. If 7PM-7AM, please contact night-coverage If you still have difficulty reaching the attending provider, please page the [facility] (Director on Call) for [facility] on amion for assistance.

## 2023-02-02 NOTE — Progress Notes (Signed)
Physical Therapy Treatment Patient Details Name: Leah Ritter MRN: [id number] DOB: 1951-02-24 Today's Date: 02/02/2023   History of Present Illness 71yo female presented to ER secondary to AMS, lethargy, unable to eat/drink; admitted for management of non-ketotic hyperglycemia    PT Comments    Pt was asleep in long sitting with BUE mitts applied. She easily awakes and agrees to PT session. Pt was able to exit bed, stand, and ambulate with CGA for safety. Pt's cognition greatly impacts session progression. She is disoriented x 3 and able to follow commands inconsistently. She did perform stairs however in unconventional manor/ impulsively. Mitts were reapplied post session with bed alarm in place and tele sitter present. PT will continue to follow. Recommend 24/7 assistance at DC.    Recommendations for follow up therapy are one component of a multi-disciplinary discharge planning process, led by the attending physician.  Recommendations may be updated based on patient status, additional functional criteria and insurance authorization.     Assistance Recommended at Discharge Frequent or constant Supervision/Assistance  Patient can return home with the following A little help with walking and/or transfers;A little help with bathing/dressing/bathroom;Assistance with cooking/housework;Direct supervision/assist for medications management;Direct supervision/assist for financial management;Assist for transportation;Help with stairs or ramp for entrance   Equipment Recommendations  Other (comment) (Defer to next level of care)       Precautions / Restrictions Precautions Precautions: Fall Restrictions Weight Bearing Restrictions: No     Mobility  Bed Mobility Overal bed mobility: Needs Assistance Bed Mobility: Supine to Sit, Sit to Supine Rolling: Supervision Supine to sit: HOB elevated Sit to supine: Supervision General bed mobility comments: Pt was able to progress to/from EOB with  supervision however due to impulsivity requires vcs throughout for safety.    Transfers Overall transfer level: Needs assistance Equipment used: Rolling walker (2 wheels) Transfers: Sit to/from Stand  General transfer comment: CGA for safety    Ambulation/Gait Ambulation/Gait assistance: Min guard Gait Distance (Feet): 200 Feet Assistive device: Rolling walker (2 wheels) Gait Pattern/deviations: Step-through pattern Gait velocity: WNL/ fast at time  General Gait Details: Pt was able to ambulate 2 laps in hallway without CGA for safety.Vcs for slowing down and correcting posture.   Stairs Stairs: Yes Stairs assistance: Min guard Stair Management: Two rails, Step to pattern, Alternating pattern, Forwards Number of Stairs: 4 General stair comments: Pt was able to ascend/descend 4 stair with BUE support + CGA for safety. pt does not perform a normal conventional why due to impulsivity/cognition deficits.    Balance Overall balance assessment: History of Falls (High fall risk due to impulsivity and cognition deficits)     Cognition Arousal/Alertness: Awake/alert Behavior During Therapy: Impulsive Overall Cognitive Status: History of cognitive impairments - at baseline  General Comments: Pt has baseline cognition deficits. Was able to follow simple commands but somewhat inconsistently               Pertinent Vitals/Pain Pain Assessment Pain Assessment: No/denies pain Faces Pain Scale: No hurt     PT Goals (current goals can now be found in the care plan section) Acute Rehab PT Goals Patient Stated Goal: none stated Progress towards PT goals: Progressing toward goals    Frequency    Min 3X/week      PT Plan Current plan remains appropriate       AM-PAC PT "6 Clicks" Mobility   Outcome Measure  Help needed turning from your back to your side while in a flat bed without using bedrails?: A  Little Help needed moving from lying on your back to sitting on the  side of a flat bed without using bedrails?: A Little Help needed moving to and from a bed to a chair (including a wheelchair)?: A Little Help needed standing up from a chair using your arms (e.g., wheelchair or bedside chair)?: A Little Help needed to walk in hospital room?: A Little Help needed climbing 3-5 steps with a railing? : A Little 6 Click Score: 18    End of Session Equipment Utilized During Treatment: Gait belt Activity Tolerance: Patient tolerated treatment well Patient left: in bed;with call bell/phone within reach;with bed alarm set;with nursing/sitter in room Chiropractor in room/ MItts donned) Nurse Communication: Mobility status PT Visit Diagnosis: Muscle weakness (generalized) (M62.81);Difficulty in walking, not elsewhere classified (R26.2)     Time: 1030-1047 PT Time Calculation (min) (ACUTE ONLY): 17 min  Charges:  $Gait Training: 8-22 mins                     Jetta Lout PTA 02/02/23, 12:50 PM

## 2023-02-02 NOTE — Discharge Summary (Signed)
[facility] Discharge Summary   Patient: Leah Ritter ZOX:[id number]  PCP: [facility]  Date of admission: 01/21/2023   Date of discharge:  02/03/2023     Discharge Diagnoses:   Principal Problem:   Hyperglycemia without ketosis Active Problems:   Acute metabolic encephalopathy   AKI (acute kidney injury) (HCC)   Chronic diastolic CHF (congestive heart failure) (HCC)   Hypernatremia   Admitted From: SNF Disposition:  LTC with hospice service  Recommendations for Outpatient Follow-up:  Follow hospice care for further management Follow up LABS/TEST: None   Diet recommendation: Regular diet  Activity: The patient is advised to gradually reintroduce usual activities, as tolerated  Discharge Condition: stable  Code Status: DNR   History of present illness: As per the H and P dictated on admission Hospital Course:  Leah Ritter is a 72 y.o. female with PMH of afib, CHF, diabetes, history of seizures, hx/o CVA presenting w/ hypothermia, HHS, hypernatremia, encephalopathy. History from daughter in setting of encephalopathy.  Workup in the ED showed blood sugars of 622 with hypothermia and hypernatremia concerning for hyperglycemic nonketotic hyperosmolar state.  Admitted to the ICU. Treated with IV fluids and IV insulin therapy.  Transfer to hospital service after completion of the IV insulin protocol. Patient had ongoing encephalopathy with poor p.o. intake with frequent hypoglycemic spells. On 5/3 mentation improving significantly, able to answer questions appropriately as well as able to eat adequately and able to ambulate in the hallway with physical therapy. The patient has declined markedly over the past months with regard to function and mentation.  Staph epi grown from 1/2 bottles on 01/21/2023. Repeat blood cultures negative x 5 days.  The plan is for the patient to return to [facility] with hospice services. Palliative care was consulted and patient did  not meet criteria for residential hospice.   Assessment and Plan: # Hyperosmolar hyperglycemic nonketotic state. Blood sugars was severely elevated with hyponatremia at the time of admission. Treated with IV insulin and IV fluid. S/p basal bolus regimen as well as Premeal coverage. Due to hypoglycemic events Lantus dose was reduced to 10 units only and Premeal coverage was discontinued.  She continues to have poor p.o. intake as well as ongoing hypoglycemia therefore currently only on sliding scale insulin. S/p D50 as needed.  Add ed Glucerna as well. Glucoses have been improved. # Acute kidney injury on CKD stage IIIa, Resolved. Creatinine 0.98 # Hypokalemia.  Potassium repleted.  Resolved. # Hypernatremia. Resolved # Community-acquired pneumonia: Patient completed 5-day antibiotic therapy.  Currently no evidence of acute infection. # Staph epi bacteria and blood: Contamination, Repeat cultures NGTD # Acute delirium secondary to metabolic encephalopathy in the setting of underlying dementia: continue Seroquel, Remeron  # Chronic diastolic CHF, Appears to be euvolemic.  Discontinued Lasix. # History of OSA; Not on any CPAP therapy. # Paroxysmal A-fib, Not on any anticoagulation right now. # HTN, blood pressure remained stable, slightly elevated, resumed amlodipine, monitor BP and titrate medications accordingly.  # HLD, discontinued statin, no long-term benefit as patient is on hospice service now   Plan is to discharge to [facility] with Hospice as she is unlikely to meet criteria for residential hospice.  Body mass index is 27.64 kg/m.  Nutrition Problem: Inadequate oral intake Etiology: poor appetite Nutrition Interventions: Interventions: Ensure Enlive (each supplement provides 350kcal and 20 grams of protein), MVI, Magic cup, Calorie Count  On the day of the discharge the patient's vitals were stable, and no other acute medical  condition were reported by patient. the patient was  felt safe to be discharge at long-term care with hospice services.   Consultants: Pulmonary critical care, palliative care and hospice Procedures: None  Discharge Exam: General: Appear in no distress, no Rash; Oral Mucosa Clear, moist. Cardiovascular: S1 and S2 Present, no Murmur, Respiratory: normal respiratory effort, Bilateral Air entry present and no Crackles, no wheezes Abdomen: Bowel Sound present, Soft and no tenderness, no hernia Extremities: no Pedal edema, no calf tenderness Neurology: alert and oriented to time, place, and person affect appropriate.  Filed Weights   01/21/23 1826  Weight: 84.9 kg   Vitals:   02/03/23 0536 02/03/23 0731  BP: (!) 146/59 131/77  Pulse: 80 82  Resp: 16 16  Temp: 97.6 F (36.4 C) 98.4 F (36.9 C)  SpO2: 96% 97%    DISCHARGE MEDICATION: Allergies as of 02/03/2023       Reactions   Oxycodone-acetaminophen Nausea And Vomiting   Other reaction(s): Vomiting   Ezetimibe Other (See Comments)   Reaction unknown   Lisinopril Other (See Comments)   Reaction unknown Other reaction(s): Unknown   Statins Other (See Comments)   Reaction unknown         Medication List     STOP taking these medications    atorvastatin 40 MG tablet Commonly known as: LIPITOR   furosemide 20 MG tablet Commonly known as: LASIX   nitrofurantoin (macrocrystal-monohydrate) 100 MG capsule Commonly known as: MACROBID       TAKE these medications    amLODipine 5 MG tablet Commonly known as: NORVASC Take 1 tablet (5 mg total) by mouth daily.   bisacodyl 10 MG suppository Commonly known as: DULCOLAX Place 10 mg rectally as needed for moderate constipation. What changed: Another medication with the same name was added. Make sure you understand how and when to take each.   bisacodyl 5 MG EC tablet Commonly known as: DULCOLAX Take 2 tablets (10 mg total) by mouth daily as needed for moderate constipation. What changed: You were already taking a  medication with the same name, and this prescription was added. Make sure you understand how and when to take each.   feeding supplement Liqd Take 237 mLs by mouth 3 (three) times daily between meals.   LORazepam 0.5 MG tablet Commonly known as: ATIVAN Take 1 tablet (0.5 mg total) by mouth every 4 (four) hours as needed for seizure, anxiety or sedation.   mirtazapine 15 MG disintegrating tablet Commonly known as: REMERON SOL-TAB Take 1 tablet (15 mg total) by mouth at bedtime.   Myrbetriq 25 MG Tb24 tablet Generic drug: mirabegron ER Take 25 mg by mouth daily.   nystatin 100000 UNIT/ML suspension Commonly known as: MYCOSTATIN Take 5 mLs (500,000 Units total) by mouth 4 (four) times daily.   polyethylene glycol 17 g packet Commonly known as: MIRALAX / GLYCOLAX Take 17 g by mouth 2 (two) times daily.   PRESCRIPTION MEDICATION Apply 2 mg topically 2 (two) times daily as needed (agitation or seizure activity). Lorazepam 2mg /mL Topical Gel   QUEtiapine 25 MG tablet Commonly known as: SEROQUEL Take 25 mg by mouth at bedtime.       Allergies  Allergen Reactions   Oxycodone-Acetaminophen Nausea And Vomiting    Other reaction(s): Vomiting   Ezetimibe Other (See Comments)    Reaction unknown   Lisinopril Other (See Comments)    Reaction unknown Other reaction(s): Unknown   Statins Other (See Comments)    Reaction unknown  The results of significant diagnostics from this hospitalization (including imaging, microbiology, ancillary and laboratory) are listed below for reference.    Significant Diagnostic Studies: CT Head Wo Contrast  Result Date: 01/21/2023 CLINICAL DATA:  Mental status change, unknown cause EXAM: CT HEAD WITHOUT CONTRAST TECHNIQUE: Contiguous axial images were obtained from the base of the skull through the vertex without intravenous contrast. RADIATION DOSE REDUCTION: This exam was performed according to the departmental dose-optimization program which  includes automated exposure control, adjustment of the mA and/or kV according to patient size and/or use of iterative reconstruction technique. COMPARISON:  CT head October 19, 23. FINDINGS: Brain: Similar appearance of a remote right frontal infarct with surrounding gliosis. No evidence of acute large vascular territory infarct, acute hemorrhage, mass lesion or midline shift. No hydrocephalus. Vascular: No hyperdense vessel identified. Skull: No acute fracture. Sinuses/Orbits: Clear sinuses.  No acute orbital findings. Other: No mastoid effusions. IMPRESSION: 1. No evidence of acute intracranial abnormality. 2. Chronic right frontal infarct. Electronically Signed   By: Feliberto Harts M.D.   On: 01/21/2023 14:11   CT CHEST ABDOMEN PELVIS WO CONTRAST  Result Date: 01/21/2023 CLINICAL DATA:  Sepsis. EXAM: CT CHEST, ABDOMEN AND PELVIS WITHOUT CONTRAST TECHNIQUE: Multidetector CT imaging of the chest, abdomen and pelvis was performed following the standard protocol without IV contrast. RADIATION DOSE REDUCTION: This exam was performed according to the departmental dose-optimization program which includes automated exposure control, adjustment of the mA and/or kV according to patient size and/or use of iterative reconstruction technique. COMPARISON:  July 06, 2021.  January 13, 2018. FINDINGS: CT CHEST FINDINGS Cardiovascular: Mild cardiomegaly. No evidence of thoracic aortic aneurysm. No pericardial effusion. Mild coronary artery calcifications are noted. Mediastinum/Nodes: No enlarged mediastinal, hilar, or axillary lymph nodes. Thyroid gland, trachea, and esophagus demonstrate no significant findings. Lungs/Pleura: No pneumothorax or pleural effusion is noted. Stable 7 mm nodule is noted in right lower lobe best seen on image number 57 of series 4. Stable 3 mm nodule is noted in right upper lobe compared to prior exam. These can be considered benign at this point with no further follow-up required. No acute  pulmonary abnormality is noted. Musculoskeletal: No chest wall mass or suspicious bone lesions identified. CT ABDOMEN PELVIS FINDINGS Hepatobiliary: Cholelithiasis. No biliary dilatation. Liver is unremarkable. Pancreas: Unremarkable. No pancreatic ductal dilatation or surrounding inflammatory changes. Spleen: Normal in size without focal abnormality. Adrenals/Urinary Tract: Adrenal glands appear normal. Stable right renal cyst is noted for which no further follow-up is required. No hydronephrosis or renal obstruction is noted. Urinary bladder is unremarkable. Stomach/Bowel: Stomach is within normal limits. Appendix appears normal. No evidence of bowel wall thickening, distention, or inflammatory changes. Vascular/Lymphatic: Aortic atherosclerosis. No enlarged abdominal or pelvic lymph nodes. Reproductive: Status post hysterectomy. No adnexal masses. Other: No abdominal wall hernia or abnormality. No abdominopelvic ascites. Musculoskeletal: No acute or significant osseous findings. IMPRESSION: No acute abnormality seen in the chest, abdomen or pelvis. Cholelithiasis. Mild coronary artery calcifications are noted. Aortic Atherosclerosis (ICD10-I70.0). Electronically Signed   By: Lupita Raider M.D.   On: 01/21/2023 12:57   DG Chest Port 1 View  Result Date: 01/21/2023 CLINICAL DATA:  Concern for sepsis. EXAM: PORTABLE CHEST 1 VIEW COMPARISON:  Chest x-ray dated July 15, 2022 FINDINGS: Cardiac and mediastinal contours are unchanged. Low lung volumes with hypoventilatory changes. No focal consolidation. No evidence of pleural effusion or pneumothorax. IMPRESSION: Low lung volumes with hypoventilatory changes. No focal consolidation. Electronically Signed   By: Allegra Lai  M.D.   On: 01/21/2023 10:30    Microbiology: Recent Results (from the past 240 hour(s))  Culture, blood (Routine X 2) w Reflex to ID Panel     Status: None   Collection Time: 01/26/23  3:01 PM   Specimen: BLOOD  Result Value Ref  Range Status   Specimen Description BLOOD BLOOD LEFT HAND  Final   Special Requests   Final    BOTTLES DRAWN AEROBIC AND ANAEROBIC Blood Culture adequate volume   Culture   Final    NO GROWTH 5 DAYS Performed at [facility], [address] [place], [place] [address]    Report Status 01/31/2023 FINAL  Final  Culture, blood (Routine X 2) w Reflex to ID Panel     Status: None   Collection Time: 01/26/23  3:18 PM   Specimen: BLOOD  Result Value Ref Range Status   Specimen Description BLOOD BLOOD RIGHT HAND  Final   Special Requests   Final    BOTTLES DRAWN AEROBIC ONLY Blood Culture results may not be optimal due to an inadequate volume of blood received in culture bottles   Culture   Final    NO GROWTH 5 DAYS Performed at [facility], [address] [place], [place] [address]    Report Status 01/31/2023 FINAL  Final     Labs: CBC: Recent Labs  Lab 01/28/23 0631 01/30/23 0900  WBC 6.9 6.5  NEUTROABS  --  3.6  HGB 11.8* 12.2  HCT 37.1 37.3  MCV 90.3 89.4  PLT 175 148*   Basic Metabolic Panel: Recent Labs  Lab 01/28/23 0631 01/29/23 0611 01/30/23 0900 01/31/23 0551 01/31/23 0553  NA 142 142 141  --  142  K 3.5 3.7 4.5  --  3.9  CL 108 110 108  --  110  CO2 27 23 27   --  23  GLUCOSE 160* 140* 192*  --  160*  BUN 22 19 21   --  22  CREATININE 1.09* 1.00 1.13*  --  0.98  CALCIUM 8.8* 9.1 9.4  --  9.0  MG 2.1 2.0  --  2.0  --   PHOS  --   --   --  2.6  --    Liver Function Tests: No results for input(s): "AST", "ALT", "ALKPHOS", "BILITOT", "PROT", "ALBUMIN" in the last 168 hours. No results for input(s): "LIPASE", "AMYLASE" in the last 168 hours. No results for input(s): "AMMONIA" in the last 168 hours. Cardiac Enzymes: No results for input(s): "CKTOTAL", "CKMB", "CKMBINDEX", "TROPONINI" in the last 168 hours. BNP (last 3 results) No results for input(s): "BNP" in the last 8760 hours. CBG: Recent Labs  Lab 02/02/23 0830 02/02/23 1211  02/02/23 1542 02/02/23 2055 02/03/23 0728  GLUCAP 149* 126* 320* 103* 106*    Time spent: 35 minutes  Signed:  Gillis Santa  [facility]  02/03/2023 10:17 AM

## 2023-02-02 NOTE — Progress Notes (Signed)
Nutrition Follow-up  DOCUMENTATION CODES:   Not applicable  INTERVENTION:   -Continue Ensure Enlive po TID, each supplement provides 350 kcal and 20 grams of protein.  -Continue MVI with minerals daily -Continue Magic cup TID with meals, each supplement provides 290 kcal and 9 grams of protein  -Continue feeding assistance with meals -Continue with regular diet for widest variety of meal selections  NUTRITION DIAGNOSIS:   Inadequate oral intake related to poor appetite as evidenced by per patient/family report.  Ongoing  GOAL:   Patient will meet greater than or equal to 90% of their needs  Unmet  MONITOR:   PO intake, Supplement acceptance  REASON FOR ASSESSMENT:   Consult, Low [place] Assessment of nutrition requirement/status, Calorie Count  ASSESSMENT:   Pt with past medical history of multiple medical issues including afib, CHF, diabetes, history of seizures, hx/o CVA presenting w/ hypothermia, HHS, hypernatremia, encephalopathy.  4/27- s/p BSE- regular diet with thin liquids  5/6- calorie count completed- 16% of estimated kcal needs, 17% of estimated protein needs  Reviewed I/O's: +120 ml x 24 hours and -4.4 L since admission  Pt lying in bed at time of visit. Breakfast tray was just delivered. Pt reports feeling better, but often answers questions inappropriately.   Pt remains with poor oral intake. Meal completions 0-50%.   Case discussed with DM coordinator, who reports concern of hyperglycemia which has improved. Concern of increasing basal rate and adding meal coverage but to erratic oral intake.   Per TOC notes, plan to /dc back to ALF with hospice services. Pt does not meet criteria for inpatient hospice.    Medications reviewed and include remeron and miralax.   Labs reviewed: CBGS: 126-149 (inpatient orders for glycemic control are 0-15 units insulin aspart TID with meals and 0-5 units insulin aspart daily at bedtime).    Diet Order:   Diet Order              Diet Carb Modified Fluid consistency: Thin; Room service appropriate? Yes  Diet effective now                   EDUCATION NEEDS:   Not appropriate for education at this time  Skin:  Skin Assessment: Reviewed RN Assessment  Last BM:  02/01/23  Height:   Ht Readings from Last 1 Encounters:  01/24/23 5\' 9"  (1.753 m)    Weight:   Wt Readings from Last 1 Encounters:  01/21/23 84.9 kg    Ideal Body Weight:  72.7 kg  BMI:  Body mass index is 27.64 kg/m.  Estimated Nutritional Needs:   Kcal:  1600-1800  Protein:  85-100 grams  Fluid:  > 1.6 L    Levada Schilling, RD, LDN, CDCES Registered Dietitian II Certified Diabetes Care and Education Specialist Please refer to [facility] for RD and/or RD on-call/weekend/after hours pager

## 2023-02-03 ENCOUNTER — Emergency Department: Payer: Medicare HMO

## 2023-02-03 ENCOUNTER — Emergency Department
Admission: EM | Admit: 2023-02-03 | Discharge: 2023-02-04 | Disposition: A | Discharge status: Home / Self Care | Payer: Medicare HMO | Attending: Emergency Medicine | Admitting: Emergency Medicine

## 2023-02-03 DIAGNOSIS — E119 Type 2 diabetes mellitus without complications: Secondary | ICD-10-CM | POA: Insufficient documentation

## 2023-02-03 DIAGNOSIS — W19XXXA Unspecified fall, initial encounter: Secondary | ICD-10-CM | POA: Diagnosis not present

## 2023-02-03 DIAGNOSIS — T83091A Other mechanical complication of indwelling urethral catheter, initial encounter: Secondary | ICD-10-CM | POA: Diagnosis present

## 2023-02-03 DIAGNOSIS — F0390 Unspecified dementia without behavioral disturbance: Secondary | ICD-10-CM | POA: Insufficient documentation

## 2023-02-03 DIAGNOSIS — R739 Hyperglycemia, unspecified: Secondary | ICD-10-CM | POA: Diagnosis not present

## 2023-02-03 DIAGNOSIS — T83021A Displacement of indwelling urethral catheter, initial encounter: Secondary | ICD-10-CM

## 2023-02-03 LAB — GLUCOSE, CAPILLARY
Glucose-Capillary: 106 mg/dL — ABNORMAL HIGH (ref 70–99)
Glucose-Capillary: 106 mg/dL — ABNORMAL HIGH (ref 70–99)

## 2023-02-03 LAB — URINALYSIS, ROUTINE W REFLEX MICROSCOPIC
Bacteria, UA: NONE SEEN
Bilirubin Urine: NEGATIVE
Glucose, UA: >=500 mg/dL — AB
Ketones, ur: 5 mg/dL — AB
Leukocytes,Ua: NEGATIVE
Nitrite: NEGATIVE
Protein, ur: NEGATIVE mg/dL
Specific Gravity, Urine: 1.026 (ref 1.005–1.030)
pH: 5 (ref 5.0–8.0)

## 2023-02-03 NOTE — TOC Transition Note (Addendum)
Transition of Care [facility]) - CM/SW Discharge Note   Patient Details  Name: Leah Ritter MRN: [id number] Date of Birth: Aug 12, 1951  [facility] CM/SW Contact:  Allena Katz, LCSW Phone Number: 02/03/2023, 1:35 PM   Clinical Narrative:   Pt has orders to discharge back to [facility] with hospice services. CSW spoke with Elita Quick and Velna Hatchet at facility who confirmed pt is now good to come back. Updated fl2 emailed to [place]. RN given number for report. Medical necessity printed to unit. CSW has called EMS and patient is first in line. CSW spoke with patients daughter to notify her of transfer. Signed DNR sent with patient.     Final next level of care: Memory Care (With Hospice) Barriers to Discharge: Barriers Resolved   Patient Goals and CMS Choice CMS Medicare.gov Compare Post Acute Care list provided to:: Patient Choice offered to / list presented to : Adult Children  Discharge Placement                  Patient to be transferred to facility by: [facility]   Patient and family notified of of transfer: 02/04/23  Discharge Plan and Services Additional resources added to the After Visit Summary for       Post Acute Care Choice: Resumption of Svcs/PTA Provider                               Social Determinants of Health (SDOH) Interventions SDOH Screenings   Food Insecurity: No Food Insecurity (02/02/2023)  Housing: Low Risk  (02/02/2023)  Transportation Needs: No Transportation Needs (07/16/2022)  Utilities: Not At Risk (07/16/2022)  Alcohol Screen: Low Risk  (09/05/2018)  Tobacco Use: Low Risk  (01/25/2023)     Readmission Risk Interventions     No data to display

## 2023-02-03 NOTE — ED Triage Notes (Signed)
First nurse note:Patient arrived by EMS from [facility] assisted living. Staff reports she pulled out her catheter and was combative. Baseline assists with walker  History dementia and diabetes  Currently alert and oriented to self only  EMS vitals: 120/72b/p 96% RA 106HR [id number]

## 2023-02-03 NOTE — Progress Notes (Signed)
Report given to [facility] at [facility]

## 2023-02-03 NOTE — Care Management Important Message (Signed)
Important Message  Patient Details  Name: Leah Ritter MRN: [id number] Date of Birth: [birth year]   Medicare Important Message Given:  Other (see comment)  Patient is to return to [facility] with Hospice. Out of respect for the patient and family no Important Message from [facility] given.    Olegario Messier A Keirsten Matuska 02/03/2023, 11:12 AM

## 2023-02-03 NOTE — ED Notes (Signed)
Pt up to bedside commode to provide urine sample.

## 2023-02-03 NOTE — Progress Notes (Signed)
Attempted to call report to [facility] twice with no answer. Will attempt one more time

## 2023-02-03 NOTE — Progress Notes (Signed)
Patient discharged in stable condition via EMS.  

## 2023-02-03 NOTE — ED Provider Notes (Signed)
[facility] Provider Note  Patient Contact: 8:58 PM (approximate)   History   catheter Issue   HPI  Leah Ritter is a 72 y.o. female who presents emergency department for complaint of self removing her urinary catheter, unwitnessed fall.  The daughter showed up, states that she was called by the facility, states that the patient had been sent back to the facility with an indwelling urinary catheter which the patient removed.  Patient also had an unwitnessed fall after returning to the long-term care facility.  The daughter does not see any visible signs of trauma, patient is at baseline according to the daughter.  Daughter states that she had a long discussion with the hospitalist and the patient was not supposed to go to the facility with a urinary catheter.  She states that she got notification from the facility that this patient had urinary retention, was supposed to see urology and that is why the catheter was left in.  Daughter states that she had no discussions with the hospitalist at any time about urinary retention and the need for indwelling catheter.  In fact patient is unable to keep indwelling catheter and unless it is placed by hospice at the long-term care facility.     Physical Exam   Triage Vital Signs: ED Triage Vitals  Enc Vitals Group     BP 02/03/23 1721 131/72     Pulse Rate 02/03/23 1721 97     Resp 02/03/23 1721 18     Temp 02/03/23 1721 97.8 F (36.6 C)     Temp Source 02/03/23 1721 Oral     SpO2 02/03/23 1721 100 %     Weight 02/03/23 1724 187 lb 2.7 oz (84.9 kg)     Height --      Head Circumference --      Peak Flow --      Pain Score 02/03/23 1725 0     Pain Loc --      Pain Edu? --      Excl. in GC? --     Most recent vital signs: Vitals:   02/03/23 1721  BP: 131/72  Pulse: 97  Resp: 18  Temp: 97.8 F (36.6 C)  SpO2: 100%     General: Alert and in no acute distress.  Patient is not oriented.  This is patient's  baseline. Cardiovascular:  Good peripheral perfusion Respiratory: Normal respiratory effort without tachypnea or retractions. Lungs CTAB. Good air entry to the bases with no decreased or absent breath sounds. Musculoskeletal: Full range of motion to all extremities.  No visible signs of trauma to the hips, back, head. Neurologic:  No gross focal neurologic deficits are appreciated.  Skin:   No rash noted Other:   ED Results / Procedures / Treatments   Labs (all labs ordered are listed, but only abnormal results are displayed) Labs Reviewed  URINALYSIS, ROUTINE W REFLEX MICROSCOPIC - Abnormal; Notable for the following components:      Result Value   Color, Urine YELLOW (*)    APPearance HAZY (*)    Glucose, UA >=500 (*)    Hgb urine dipstick MODERATE (*)    Ketones, ur 5 (*)    All other components within normal limits     EKG     RADIOLOGY  I personally viewed, evaluated, and interpreted these images as part of my medical decision making, as well as reviewing the written report by the radiologist.  ED Provider Interpretation: No acute  traumatic wounds a CT scan of the head, cervical spine or x-rays.  CT Head Wo Contrast  Result Date: 02/03/2023 CLINICAL DATA:  Unwitnessed fall.  Possibly hit head. EXAM: CT HEAD WITHOUT CONTRAST CT CERVICAL SPINE WITHOUT CONTRAST TECHNIQUE: Multidetector CT imaging of the head and cervical spine was performed following the standard protocol without intravenous contrast. Multiplanar CT image reconstructions of the cervical spine were also generated. RADIATION DOSE REDUCTION: This exam was performed according to the departmental dose-optimization program which includes automated exposure control, adjustment of the mA and/or kV according to patient size and/or use of iterative reconstruction technique. COMPARISON:  CT head 01/21/2023 and CT cervical spine 06/17/2019 FINDINGS: CT HEAD FINDINGS Brain: No intracranial hemorrhage, mass effect, or  evidence of acute infarct. No hydrocephalus. No extra-axial fluid collection. Generalized cerebral atrophy. Ill-defined hypoattenuation within the cerebral white matter is nonspecific but consistent with chronic small vessel ischemic disease. Chronic right frontal infarct. Vascular: No hyperdense vessel. Intracranial arterial calcification. Skull: No fracture or focal lesion. Sinuses/Orbits: No acute finding. Paranasal sinuses and mastoid air cells are well aerated. Other: None. CT CERVICAL SPINE FINDINGS Alignment: No evidence of traumatic malalignment. Skull base and vertebrae: No acute fracture. No primary bone lesion or focal pathologic process. Soft tissues and spinal canal: No prevertebral fluid or swelling. No visible canal hematoma. Disc levels: Multilevel spondylosis, disc space height loss, and degenerative endplate change greatest at C4-C5 and C5-C6 where there is bulky anterior osteophytes. No high-grade spinal canal or neural foraminal narrowing. Upper chest: No acute abnormality. Other: None. IMPRESSION: 1. No acute intracranial abnormality. Chronic right frontal infarct. 2. No acute fracture in the cervical spine. Electronically Signed   By: Minerva Fester M.D.   On: 02/03/2023 21:41   CT Cervical Spine Wo Contrast  Result Date: 02/03/2023 CLINICAL DATA:  Unwitnessed fall.  Possibly hit head. EXAM: CT HEAD WITHOUT CONTRAST CT CERVICAL SPINE WITHOUT CONTRAST TECHNIQUE: Multidetector CT imaging of the head and cervical spine was performed following the standard protocol without intravenous contrast. Multiplanar CT image reconstructions of the cervical spine were also generated. RADIATION DOSE REDUCTION: This exam was performed according to the departmental dose-optimization program which includes automated exposure control, adjustment of the mA and/or kV according to patient size and/or use of iterative reconstruction technique. COMPARISON:  CT head 01/21/2023 and CT cervical spine 06/17/2019  FINDINGS: CT HEAD FINDINGS Brain: No intracranial hemorrhage, mass effect, or evidence of acute infarct. No hydrocephalus. No extra-axial fluid collection. Generalized cerebral atrophy. Ill-defined hypoattenuation within the cerebral white matter is nonspecific but consistent with chronic small vessel ischemic disease. Chronic right frontal infarct. Vascular: No hyperdense vessel. Intracranial arterial calcification. Skull: No fracture or focal lesion. Sinuses/Orbits: No acute finding. Paranasal sinuses and mastoid air cells are well aerated. Other: None. CT CERVICAL SPINE FINDINGS Alignment: No evidence of traumatic malalignment. Skull base and vertebrae: No acute fracture. No primary bone lesion or focal pathologic process. Soft tissues and spinal canal: No prevertebral fluid or swelling. No visible canal hematoma. Disc levels: Multilevel spondylosis, disc space height loss, and degenerative endplate change greatest at C4-C5 and C5-C6 where there is bulky anterior osteophytes. No high-grade spinal canal or neural foraminal narrowing. Upper chest: No acute abnormality. Other: None. IMPRESSION: 1. No acute intracranial abnormality. Chronic right frontal infarct. 2. No acute fracture in the cervical spine. Electronically Signed   By: Minerva Fester M.D.   On: 02/03/2023 21:41   DG Femur Min 2 Views Left  Result Date: 02/03/2023 CLINICAL DATA:  Fall  left hip pain EXAM: LEFT FEMUR 2 VIEWS COMPARISON:  None Available. FINDINGS: There is no evidence of fracture or other focal bone lesions. Soft tissues are unremarkable. IMPRESSION: Negative. Electronically Signed   By: [name] D.O.   On: 02/03/2023 21:36   DG Lumbar Spine 2-3 Views  Result Date: 02/03/2023 CLINICAL DATA:  Unwitnessed fall. EXAM: LUMBAR SPINE - 2-3 VIEW COMPARISON:  None Available. FINDINGS: There is no evidence of lumbar spine fracture. Straightening of the lumbar spine. Multilevel degenerate disc disease with disc height loss and marginal  osteophytes. Multilevel facet joint arthropathy. IMPRESSION: 1. No acute fracture. 2. Multilevel degenerative disc disease and facet joint arthropathy. Electronically Signed   By: [name] D.O.   On: 02/03/2023 21:36   DG Pelvis 1-2 Views  Result Date: 02/03/2023 CLINICAL DATA:  Unwitnessed fall. EXAM: PELVIS - 1-2 VIEW COMPARISON:  None Available. FINDINGS: There is no evidence of pelvic fracture or diastasis. Mild bilateral hip osteoarthritis. Degenerate disc disease of the lower lumbar spine. No pelvic bone lesions are seen. IMPRESSION: No acute findings. Mild bilateral hip osteoarthritis. Electronically Signed   By: [name] D.O.   On: 02/03/2023 21:35    PROCEDURES:  Critical Care performed: No  Procedures   MEDICATIONS ORDERED IN ED: Medications - No data to display   IMPRESSION / MDM / ASSESSMENT AND PLAN / ED COURSE  I reviewed the triage vital signs and the nursing notes.                              Clinical Course as of 02/03/23 2340  Thu Feb 03, 2023  2100 Patient arrived after removing Foley catheter on her own.  Evidently she was discharged from her recent admission with indwelling Foley catheter.  I have reviewed her notes extensively looking at nursing notes, provider ongoing documentation, PT notes I do not see any indication that patient must keep Foley catheter in.  I do not see any notes where patient had urinary retention, nor do I see any documentation necessitating urology follow-up after discharge..  At this time if this is an issue at the validity where she has urinary retention and subsequently needs a Foley catheter hospice will place.  Patient will have x-rays to ensure no traumatic finding from her recent fall but will not replace urinary catheter at this time. [JC]    Clinical Course User Index [JC] Haylee Mcanany, Delorise Royals, PA-C    Differential diagnosis includes, but is not limited to, fall, hip contusion, hip fracture, head trauma, skull fracture,  intracranial hemorrhage, bladder trauma from Foley catheter removal, UTI  Patient's presentation is most consistent with acute presentation with potential threat to life or bodily function.   Patient's diagnosis is consistent with accidental Foley cath removal, fall.  Patient presents emergency department after pulling out her Foley catheter and sustaining a unwitnessed fall.  Imaging reveals no traumatic findings.  Physical exam is reassuring with no obvious signs of trauma.  Patient is at her baseline according to the daughter who is present.  Patient was discharged with a Foley catheter which the facility is apparently having difficult time maintaining.  Patient removed it by herself.  There is no signs of trauma from the Foley cath removal.  Patient is not retaining any urine, does not have a UTI, according to her previous hospitalization notes I do not see any evidence of urinary outlet obstruction, urinary retention or need for close  follow-up with urology.  As this is a issue with the facility, patient will not leave the Foley catheter alone, I feel that it is safe to leave out at this time.  Foley cath will not be replaced.  If patient does develop signs of urinary retention recommend Foley cath placement at the facility is not sure return to the emergency department as needed.  Follow-up with primary care..  Patient is given ED precautions to return to the ED for any worsening or new symptoms.     FINAL CLINICAL IMPRESSION(S) / ED DIAGNOSES   Final diagnoses:  Fall, initial encounter  Displacement of indwelling urethral catheter, initial encounter (HCC)     Rx / DC Orders   ED Discharge Orders     None        Note:  This document was prepared using Dragon voice recognition software and may include unintentional dictation errors.   Lanette Hampshire 02/03/23 2340    Sharman Cheek, MD 02/08/23 1004

## 2023-02-03 NOTE — Discharge Instructions (Addendum)
At this time patient has sustained no trauma from the fall.  Urinalysis is reassuring with no evidence of significant bladder trauma from accidental removal of Foley cath.  There is no evidence of infection.  At this time patient is not retaining urine, is appropriate for discharge without a Foley catheter.  I do not see any notes from her previous admission with urinary retention, urinary outlet obstruction or need for urology follow-up at this time.  Should patient have any future symptoms of retention, would suggest either return to the emergency department or placement of Foley cath.

## 2023-02-03 NOTE — NC FL2 (Addendum)
[place] MEDICAID FL2 LEVEL OF CARE FORM     IDENTIFICATION  Patient Name: Leah Ritter Birthdate: 21-Jan-1951 Sex: female Admission Date (Current Location): 01/21/2023  [facility] and [place] Number:  Chiropodist and Address:         Provider Number: [id number]  Attending Physician Name and Address:  Gillis Santa, MD  Relative Name and Phone Number:  Fredric Mare (Legal Guardian) (Daughter) [phone] (    Current Level of Care: Hospital Recommended Level of Care: Assisted Living Facility/Memory Care (With Hospice) Prior Approval Number:    Date Approved/Denied:   PASRR Number:    Discharge Plan: Domiciliary (Rest home)    Current Diagnoses: Patient Active Problem List   Diagnosis Date Noted   Hypernatremia 01/25/2023   Hyperglycemia without ketosis 01/21/2023   Seizure (HCC) 07/16/2022   Sepsis due to gram-negative UTI (HCC) 07/16/2022   Dyslipidemia 07/16/2022   Essential hypertension 07/16/2022   Paroxysmal atrial fibrillation (HCC) 07/16/2022   Dementia with behavioral disturbance (HCC) 07/16/2022   Uncontrolled type 2 diabetes mellitus with hypoglycemia, without long-term current use of insulin (HCC) 07/16/2022   Cystocele with prolapse 12/15/2021   Bacteremia 12/12/2021   UTI (urinary tract infection) 12/11/2021   Abnormal LFTs 12/11/2021   Hyperkalemia 12/10/2021   Anemia 12/10/2021   Acute metabolic encephalopathy 07/06/2021   Hypothermia 07/06/2021   Type II diabetes mellitus with renal manifestations (HCC) 07/06/2021   History of stroke    A-fib (HCC)    Chronic diastolic CHF (congestive heart failure) (HCC)    Depression with anxiety    CKD (chronic kidney disease), stage IIIb    Aspiration pneumonia (HCC)    Nausea & vomiting    AKI (acute kidney injury) (HCC)    Hyperglycemia    SIRS (systemic inflammatory response syndrome) (HCC) 01/09/2019   Acute on chronic systolic CHF (congestive heart failure) (HCC) 01/09/2019   Pain in  joint involving ankle and foot 01/03/2018   Localized, primary osteoarthritis of shoulder region 01/03/2018   Rupture of tendon of biceps, long head 01/03/2018   Disorder of bursae of shoulder region 01/03/2018   Screening declined by patient 10/25/2017   Full thickness rotator cuff tear 04/21/2016   Chronic bilateral low back pain without sciatica 02/10/2016   H/O rotator cuff tear 02/10/2016   History of vitamin D deficiency 02/10/2016   Type 2 diabetes mellitus without complication, with long-term current use of insulin (HCC) 02/10/2016   HLD (hyperlipidemia) 11/24/2006   OBESITY, NOS 11/24/2006   POST TRAUMATIC STRESS DISORDER 11/24/2006   DEPRESSIVE DISORDER, NOS 11/24/2006   HYPERTENSION, BENIGN SYSTEMIC 11/24/2006   RHINITIS, ALLERGIC 11/24/2006   GERD (gastroesophageal reflux disease) 11/24/2006   OSTEOARTHRITIS, MULTI SITES 11/24/2006   Sleep apnea 11/24/2006    Orientation RESPIRATION BLADDER Height & Weight     Self    Incontinent Weight: 187 lb 2.7 oz (84.9 kg) Height:  5\' 9"  (175.3 cm)  BEHAVIORAL SYMPTOMS/MOOD NEUROLOGICAL BOWEL NUTRITION STATUS      Incontinent Diet (See DC summary)  AMBULATORY STATUS COMMUNICATION OF NEEDS Skin   Extensive Assist Nonverbal                         Personal Care Assistance Level of Assistance  Feeding, Bathing, Dressing Bathing Assistance: Maximum assistance Feeding assistance: Maximum assistance Dressing Assistance: Maximum assistance     Functional Limitations Info             SPECIAL CARE FACTORS FREQUENCY  Contractures Contractures Info: Not present    Additional Factors Info  Code Status, Allergies Code Status Info: DNR Allergies Info: Oxycodone-acetaminophen  Ezetimibe  Lisinopril  Statins           Current Medications (02/03/2023):  This is the current hospital active medication list Current Facility-Administered Medications  Medication Dose Route Frequency Provider Last  Rate Last Admin   bisacodyl (DULCOLAX) EC tablet 10 mg  10 mg Oral Daily PRN Gillis Santa, MD   10 mg at 02/02/23 0819   bisacodyl (DULCOLAX) suppository 10 mg  10 mg Rectal Daily PRN Gillis Santa, MD   10 mg at 02/01/23 1653   Chlorhexidine Gluconate Cloth 2 % PADS 6 each  6 each Topical Daily Swayze, Ava, DO   6 each at 02/03/23 0945   dextrose 50 % solution 50 mL  1 ampule Intravenous PRN Rolly Salter, MD       enoxaparin (LOVENOX) injection 40 mg  40 mg Subcutaneous QHS Gillis Santa, MD   40 mg at 02/02/23 2112   feeding supplement (ENSURE ENLIVE / ENSURE PLUS) liquid 237 mL  237 mL Oral TID BM Rolly Salter, MD   237 mL at 02/03/23 0946   insulin aspart (novoLOG) injection 0-15 Units  0-15 Units Subcutaneous TID WC Rolly Salter, MD   11 Units at 02/02/23 1633   insulin aspart (novoLOG) injection 0-5 Units  0-5 Units Subcutaneous QHS Rolly Salter, MD       LORazepam (ATIVAN) tablet 0.5 mg  0.5 mg Oral Q4H PRN Rolly Salter, MD       mirabegron ER [facility]) tablet 25 mg  25 mg Oral Daily Cordella Register A, RPH   25 mg at 02/03/23 0900   mirtazapine (REMERON) tablet 15 mg  15 mg Oral QHS Rolly Salter, MD   15 mg at 02/02/23 2113   multivitamin with minerals tablet 1 tablet  1 tablet Oral Daily Rolly Salter, MD   1 tablet at 02/03/23 0900   nystatin (MYCOSTATIN) 100000 UNIT/ML suspension 500,000 Units  5 mL Oral QID Earley Brooke, MD   500,000 Units at 02/03/23 0900   Oral care mouth rinse  15 mL Mouth Rinse PRN Pokhrel, Laxman, MD       polyethylene glycol (MIRALAX / GLYCOLAX) packet 17 g  17 g Oral BID Gillis Santa, MD   17 g at 02/03/23 0900   QUEtiapine (SEROQUEL) tablet 25 mg  25 mg Oral QHS Rolly Salter, MD   25 mg at 02/02/23 2113     Discharge Medications: Please see discharge summary for a list of discharge medications.  STOP taking these medications     atorvastatin 40 MG tablet Commonly known as: LIPITOR    furosemide 20 MG tablet Commonly known  as: LASIX    nitrofurantoin (macrocrystal-monohydrate) 100 MG capsule Commonly known as: MACROBID           TAKE these medications     amLODipine 5 MG tablet Commonly known as: NORVASC Take 1 tablet (5 mg total) by mouth daily.    bisacodyl 10 MG suppository Commonly known as: DULCOLAX Place 10 mg rectally as needed for moderate constipation. What changed: Another medication with the same name was added. Make sure you understand how and when to take each.    bisacodyl 5 MG EC tablet Commonly known as: DULCOLAX Take 2 tablets (10 mg total) by mouth daily as needed for moderate constipation. What changed: You were already  taking a medication with the same name, and this prescription was added. Make sure you understand how and when to take each.    feeding supplement Liqd Take 237 mLs by mouth 3 (three) times daily between meals.    LORazepam 0.5 MG tablet Commonly known as: ATIVAN Take 1 tablet (0.5 mg total) by mouth every 4 (four) hours as needed for seizure, anxiety or sedation.    mirtazapine 15 MG disintegrating tablet Commonly known as: REMERON SOL-TAB Take 1 tablet (15 mg total) by mouth at bedtime.    Myrbetriq 25 MG Tb24 tablet Generic drug: mirabegron ER Take 25 mg by mouth daily.    nystatin 100000 UNIT/ML suspension Commonly known as: MYCOSTATIN Take 5 mLs (500,000 Units total) by mouth 4 (four) times daily.    polyethylene glycol 17 g packet Commonly known as: MIRALAX / GLYCOLAX Take 17 g by mouth 2 (two) times daily.    PRESCRIPTION MEDICATION Apply 2 mg topically 2 (two) times daily as needed (agitation or seizure activity). Lorazepam 2mg /mL Topical Gel    QUEtiapine 25 MG tablet Commonly known as: SEROQUEL Take 25 mg by mouth at bedtime.    Relevant Imaging Results:  Relevant Lab Results:   Additional Information [id number]  Marvelle Span, LCSW

## 2023-02-03 NOTE — Plan of Care (Signed)
  Problem: Education: Goal: Knowledge of General Education information will improve Description: Including pain rating scale, medication(s)/side effects and non-pharmacologic comfort measures Outcome: Progressing   Problem: Clinical Measurements: Goal: Ability to maintain clinical measurements within normal limits will improve Outcome: Progressing   Problem: Activity: Goal: Risk for activity intolerance will decrease Outcome: Progressing   Problem: Coping: Goal: Level of anxiety will decrease Outcome: Progressing   Problem: Elimination: Goal: Will not experience complications related to bowel motility Outcome: Progressing   Problem: Pain Managment: Goal: General experience of comfort will improve Outcome: Progressing   Problem: Safety: Goal: Ability to remain free from injury will improve Outcome: Progressing   

## 2023-02-04 ENCOUNTER — Other Ambulatory Visit: Payer: Self-pay

## 2023-02-04 NOTE — ED Notes (Signed)
Patient discharged at this time. Wheeled to lobby and assisted into POV of daughter.  Pt Breathing unlabored speaking in full sentences. Pt legal guardian verbalized understanding of all discharge, follow up, and medication teaching. Discharged homed with all belongings. Pt daughter and legal guardian transporting pt back to [facility] via POV.

## 2023-10-09 ENCOUNTER — Emergency Department: Payer: 59

## 2023-10-09 ENCOUNTER — Inpatient Hospital Stay: Payer: 59

## 2023-10-09 ENCOUNTER — Inpatient Hospital Stay
Admission: EM | Admit: 2023-10-09 | Discharge: 2023-10-18 | DRG: 637 | Disposition: A | Discharge status: Nursing Home (Includes ICF) | Payer: 59 | Source: Skilled Nursing Facility | Attending: Internal Medicine | Admitting: Internal Medicine

## 2023-10-09 ENCOUNTER — Encounter: Payer: Self-pay | Admitting: Emergency Medicine

## 2023-10-09 ENCOUNTER — Other Ambulatory Visit: Payer: Self-pay

## 2023-10-09 DIAGNOSIS — G4733 Obstructive sleep apnea (adult) (pediatric): Secondary | ICD-10-CM | POA: Diagnosis present

## 2023-10-09 DIAGNOSIS — Z7984 Long term (current) use of oral hypoglycemic drugs: Secondary | ICD-10-CM

## 2023-10-09 DIAGNOSIS — Z90711 Acquired absence of uterus with remaining cervical stump: Secondary | ICD-10-CM

## 2023-10-09 DIAGNOSIS — Z794 Long term (current) use of insulin: Secondary | ICD-10-CM

## 2023-10-09 DIAGNOSIS — E1165 Type 2 diabetes mellitus with hyperglycemia: Secondary | ICD-10-CM | POA: Diagnosis present

## 2023-10-09 DIAGNOSIS — E785 Hyperlipidemia, unspecified: Secondary | ICD-10-CM | POA: Diagnosis present

## 2023-10-09 DIAGNOSIS — M7989 Other specified soft tissue disorders: Secondary | ICD-10-CM | POA: Diagnosis present

## 2023-10-09 DIAGNOSIS — E11649 Type 2 diabetes mellitus with hypoglycemia without coma: Secondary | ICD-10-CM | POA: Diagnosis present

## 2023-10-09 DIAGNOSIS — G4700 Insomnia, unspecified: Secondary | ICD-10-CM | POA: Diagnosis present

## 2023-10-09 DIAGNOSIS — I5032 Chronic diastolic (congestive) heart failure: Secondary | ICD-10-CM | POA: Diagnosis present

## 2023-10-09 DIAGNOSIS — G4730 Sleep apnea, unspecified: Secondary | ICD-10-CM | POA: Diagnosis present

## 2023-10-09 DIAGNOSIS — Z8249 Family history of ischemic heart disease and other diseases of the circulatory system: Secondary | ICD-10-CM

## 2023-10-09 DIAGNOSIS — Z6830 Body mass index (BMI) 30.0-30.9, adult: Secondary | ICD-10-CM

## 2023-10-09 DIAGNOSIS — E1122 Type 2 diabetes mellitus with diabetic chronic kidney disease: Secondary | ICD-10-CM | POA: Diagnosis present

## 2023-10-09 DIAGNOSIS — E1100 Type 2 diabetes mellitus with hyperosmolarity without nonketotic hyperglycemic-hyperosmolar coma (NKHHC): Principal | ICD-10-CM | POA: Diagnosis present

## 2023-10-09 DIAGNOSIS — I10 Essential (primary) hypertension: Secondary | ICD-10-CM | POA: Diagnosis not present

## 2023-10-09 DIAGNOSIS — K219 Gastro-esophageal reflux disease without esophagitis: Secondary | ICD-10-CM | POA: Diagnosis present

## 2023-10-09 DIAGNOSIS — I471 Supraventricular tachycardia, unspecified: Secondary | ICD-10-CM | POA: Diagnosis present

## 2023-10-09 DIAGNOSIS — Z823 Family history of stroke: Secondary | ICD-10-CM

## 2023-10-09 DIAGNOSIS — G4739 Other sleep apnea: Secondary | ICD-10-CM | POA: Diagnosis not present

## 2023-10-09 DIAGNOSIS — I4891 Unspecified atrial fibrillation: Secondary | ICD-10-CM | POA: Diagnosis present

## 2023-10-09 DIAGNOSIS — R6510 Systemic inflammatory response syndrome (SIRS) of non-infectious origin without acute organ dysfunction: Secondary | ICD-10-CM | POA: Diagnosis present

## 2023-10-09 DIAGNOSIS — F03C18 Unspecified dementia, severe, with other behavioral disturbance: Secondary | ICD-10-CM | POA: Diagnosis present

## 2023-10-09 DIAGNOSIS — B9620 Unspecified Escherichia coli [E. coli] as the cause of diseases classified elsewhere: Secondary | ICD-10-CM | POA: Diagnosis present

## 2023-10-09 DIAGNOSIS — Z833 Family history of diabetes mellitus: Secondary | ICD-10-CM

## 2023-10-09 DIAGNOSIS — E66811 Obesity, class 1: Secondary | ICD-10-CM | POA: Diagnosis present

## 2023-10-09 DIAGNOSIS — N3001 Acute cystitis with hematuria: Secondary | ICD-10-CM | POA: Diagnosis present

## 2023-10-09 DIAGNOSIS — Z79899 Other long term (current) drug therapy: Secondary | ICD-10-CM

## 2023-10-09 DIAGNOSIS — Z841 Family history of disorders of kidney and ureter: Secondary | ICD-10-CM

## 2023-10-09 DIAGNOSIS — N179 Acute kidney failure, unspecified: Secondary | ICD-10-CM | POA: Diagnosis present

## 2023-10-09 DIAGNOSIS — Z885 Allergy status to narcotic agent status: Secondary | ICD-10-CM

## 2023-10-09 DIAGNOSIS — N189 Chronic kidney disease, unspecified: Secondary | ICD-10-CM

## 2023-10-09 DIAGNOSIS — K5900 Constipation, unspecified: Secondary | ICD-10-CM | POA: Insufficient documentation

## 2023-10-09 DIAGNOSIS — N1831 Chronic kidney disease, stage 3a: Secondary | ICD-10-CM | POA: Diagnosis present

## 2023-10-09 DIAGNOSIS — E870 Hyperosmolality and hypernatremia: Secondary | ICD-10-CM | POA: Diagnosis present

## 2023-10-09 DIAGNOSIS — G9341 Metabolic encephalopathy: Secondary | ICD-10-CM | POA: Diagnosis present

## 2023-10-09 DIAGNOSIS — Z7189 Other specified counseling: Secondary | ICD-10-CM | POA: Diagnosis not present

## 2023-10-09 DIAGNOSIS — Z66 Do not resuscitate: Secondary | ICD-10-CM | POA: Diagnosis present

## 2023-10-09 DIAGNOSIS — F03C2 Unspecified dementia, severe, with psychotic disturbance: Secondary | ICD-10-CM | POA: Diagnosis present

## 2023-10-09 DIAGNOSIS — E669 Obesity, unspecified: Secondary | ICD-10-CM | POA: Diagnosis present

## 2023-10-09 DIAGNOSIS — Z803 Family history of malignant neoplasm of breast: Secondary | ICD-10-CM

## 2023-10-09 DIAGNOSIS — F03C4 Unspecified dementia, severe, with anxiety: Secondary | ICD-10-CM | POA: Diagnosis present

## 2023-10-09 DIAGNOSIS — I130 Hypertensive heart and chronic kidney disease with heart failure and stage 1 through stage 4 chronic kidney disease, or unspecified chronic kidney disease: Secondary | ICD-10-CM | POA: Diagnosis present

## 2023-10-09 DIAGNOSIS — E1129 Type 2 diabetes mellitus with other diabetic kidney complication: Secondary | ICD-10-CM | POA: Diagnosis present

## 2023-10-09 DIAGNOSIS — Z888 Allergy status to other drugs, medicaments and biological substances status: Secondary | ICD-10-CM

## 2023-10-09 DIAGNOSIS — N390 Urinary tract infection, site not specified: Secondary | ICD-10-CM | POA: Diagnosis present

## 2023-10-09 DIAGNOSIS — K5909 Other constipation: Secondary | ICD-10-CM | POA: Diagnosis not present

## 2023-10-09 DIAGNOSIS — Z8673 Personal history of transient ischemic attack (TIA), and cerebral infarction without residual deficits: Secondary | ICD-10-CM

## 2023-10-09 DIAGNOSIS — R739 Hyperglycemia, unspecified: Principal | ICD-10-CM

## 2023-10-09 LAB — BASIC METABOLIC PANEL
Anion gap: 15 (ref 5–15)
Anion gap: 14 (ref 5–15)
BUN: 55 mg/dL — ABNORMAL HIGH (ref 8–23)
BUN: 55 mg/dL — ABNORMAL HIGH (ref 8–23)
CO2: 25 mmol/L (ref 22–32)
CO2: 24 mmol/L (ref 22–32)
Calcium: 9.5 mg/dL (ref 8.9–10.3)
Calcium: 9.5 mg/dL (ref 8.9–10.3)
Chloride: 118 mmol/L — ABNORMAL HIGH (ref 98–111)
Chloride: 122 mmol/L — ABNORMAL HIGH (ref 98–111)
Creatinine, Ser: 1.98 mg/dL — ABNORMAL HIGH (ref 0.44–1.00)
Creatinine, Ser: 1.59 mg/dL — ABNORMAL HIGH (ref 0.44–1.00)
GFR, Estimated: 26 mL/min — ABNORMAL LOW (ref >60)
GFR, Estimated: 34 mL/min — ABNORMAL LOW (ref >60)
Glucose, Bld: 357 mg/dL — ABNORMAL HIGH (ref 70–99)
Glucose, Bld: 135 mg/dL — ABNORMAL HIGH (ref 70–99)
Potassium: 3.4 mmol/L — ABNORMAL LOW (ref 3.5–5.1)
Potassium: 3.7 mmol/L (ref 3.5–5.1)
Sodium: 158 mmol/L — ABNORMAL HIGH (ref 135–145)
Sodium: 160 mmol/L — ABNORMAL HIGH (ref 135–145)

## 2023-10-09 LAB — GLUCOSE, CAPILLARY
Glucose-Capillary: 335 mg/dL — ABNORMAL HIGH (ref 70–99)
Glucose-Capillary: 258 mg/dL — ABNORMAL HIGH (ref 70–99)
Glucose-Capillary: 221 mg/dL — ABNORMAL HIGH (ref 70–99)
Glucose-Capillary: 163 mg/dL — ABNORMAL HIGH (ref 70–99)
Glucose-Capillary: 134 mg/dL — ABNORMAL HIGH (ref 70–99)
Glucose-Capillary: 162 mg/dL — ABNORMAL HIGH (ref 70–99)
Glucose-Capillary: 107 mg/dL — ABNORMAL HIGH (ref 70–99)

## 2023-10-09 LAB — URINALYSIS, ROUTINE W REFLEX MICROSCOPIC
Bilirubin Urine: NEGATIVE
Glucose, UA: >=500 mg/dL — AB
Ketones, ur: NEGATIVE mg/dL
Nitrite: POSITIVE — AB
Protein, ur: NEGATIVE mg/dL
Specific Gravity, Urine: 1.027 (ref 1.005–1.030)
WBC, UA: >50 WBC/hpf (ref 0–5)
pH: 5 (ref 5.0–8.0)

## 2023-10-09 LAB — CBC WITH DIFFERENTIAL/PLATELET
Abs Immature Granulocytes: 0.05 10*3/uL (ref 0.00–0.07)
Basophils Absolute: 0.1 10*3/uL (ref 0.0–0.1)
Basophils Relative: 0 %
Eosinophils Absolute: 0 10*3/uL (ref 0.0–0.5)
Eosinophils Relative: 0 %
HCT: 41.2 % (ref 36.0–46.0)
Hemoglobin: 12.9 g/dL (ref 12.0–15.0)
Immature Granulocytes: 0 %
Lymphocytes Relative: 9 %
Lymphs Abs: 1.2 10*3/uL (ref 0.7–4.0)
MCH: 29.8 pg (ref 26.0–34.0)
MCHC: 31.3 g/dL (ref 30.0–36.0)
MCV: 95.2 fL (ref 80.0–100.0)
Monocytes Absolute: 1 10*3/uL (ref 0.1–1.0)
Monocytes Relative: 7 %
Neutro Abs: 11.3 10*3/uL — ABNORMAL HIGH (ref 1.7–7.7)
Neutrophils Relative %: 84 %
Platelets: 239 10*3/uL (ref 150–400)
RBC: 4.33 MIL/uL (ref 3.87–5.11)
RDW: 13.5 % (ref 11.5–15.5)
WBC: 13.5 10*3/uL — ABNORMAL HIGH (ref 4.0–10.5)
nRBC: 0 % (ref 0.0–0.2)

## 2023-10-09 LAB — BLOOD GAS, VENOUS

## 2023-10-09 LAB — TROPONIN I (HIGH SENSITIVITY)
Troponin I (High Sensitivity): 32 ng/L — ABNORMAL HIGH (ref <18)
Troponin I (High Sensitivity): 32 ng/L — ABNORMAL HIGH (ref <18)

## 2023-10-09 LAB — CBG MONITORING, ED
Glucose-Capillary: 549 mg/dL (ref 70–99)
Glucose-Capillary: 443 mg/dL — ABNORMAL HIGH (ref 70–99)
Glucose-Capillary: 457 mg/dL — ABNORMAL HIGH (ref 70–99)

## 2023-10-09 LAB — COMPREHENSIVE METABOLIC PANEL
ALT: 21 U/L (ref 0–44)
AST: 25 U/L (ref 15–41)
Albumin: 3.9 g/dL (ref 3.5–5.0)
Alkaline Phosphatase: 104 U/L (ref 38–126)
Anion gap: 12 (ref 5–15)
BUN: 70 mg/dL — ABNORMAL HIGH (ref 8–23)
CO2: 27 mmol/L (ref 22–32)
Calcium: 9.7 mg/dL (ref 8.9–10.3)
Chloride: 114 mmol/L — ABNORMAL HIGH (ref 98–111)
Creatinine, Ser: 2.37 mg/dL — ABNORMAL HIGH (ref 0.44–1.00)
GFR, Estimated: 21 mL/min — ABNORMAL LOW (ref >60)
Glucose, Bld: 589 mg/dL (ref 70–99)
Potassium: 3.8 mmol/L (ref 3.5–5.1)
Sodium: 153 mmol/L — ABNORMAL HIGH (ref 135–145)
Total Bilirubin: 0.7 mg/dL (ref 0.0–1.2)
Total Protein: 8.3 g/dL — ABNORMAL HIGH (ref 6.5–8.1)

## 2023-10-09 LAB — OSMOLALITY: Osmolality: 382 mosm/kg (ref 275–295)

## 2023-10-09 LAB — MRSA NEXT GEN BY PCR, NASAL: MRSA by PCR Next Gen: NOT DETECTED

## 2023-10-09 LAB — PROTIME-INR
INR: 1.2 (ref 0.8–1.2)
Prothrombin Time: 15.7 s — ABNORMAL HIGH (ref 11.4–15.2)

## 2023-10-09 LAB — PROCALCITONIN: Procalcitonin: <0.1 ng/mL

## 2023-10-09 LAB — POTASSIUM: Potassium: 4.1 mmol/L (ref 3.5–5.1)

## 2023-10-09 LAB — BETA-HYDROXYBUTYRIC ACID: Beta-Hydroxybutyric Acid: 0.52 mmol/L — ABNORMAL HIGH (ref 0.05–0.27)

## 2023-10-09 LAB — APTT: aPTT: 24 s (ref 24–36)

## 2023-10-09 LAB — MAGNESIUM: Magnesium: 2.8 mg/dL — ABNORMAL HIGH (ref 1.7–2.4)

## 2023-10-09 LAB — LACTIC ACID, PLASMA: Lactic Acid, Venous: 1.7 mmol/L (ref 0.5–1.9)

## 2023-10-09 MED ORDER — HEPARIN SODIUM (PORCINE) 5000 UNIT/ML IJ SOLN
5000.0000 [IU] | Freq: Three times a day (TID) | INTRAMUSCULAR | Status: DC
  Administered 2023-10-09 – 2023-10-18 (×27): 5000 [IU] via SUBCUTANEOUS
  Filled 2023-10-09 (×26): qty 1

## 2023-10-09 MED ORDER — DEXTROSE 50 % IV SOLN: 0.0000 mL | INTRAVENOUS | Status: DC | PRN

## 2023-10-09 MED ORDER — POTASSIUM CHLORIDE 10 MEQ/100ML IV SOLN
10.0000 meq | INTRAVENOUS | Status: AC
  Administered 2023-10-09 (×2): 10 meq via INTRAVENOUS
  Filled 2023-10-09 (×2): qty 100

## 2023-10-09 MED ORDER — HYDRALAZINE HCL 20 MG/ML IJ SOLN
5.0000 mg | Freq: Four times a day (QID) | INTRAMUSCULAR | Status: AC | PRN
  Administered 2023-10-10 (×2): 5 mg via INTRAVENOUS
  Filled 2023-10-09 (×2): qty 1

## 2023-10-09 MED ORDER — DEXTROSE IN LACTATED RINGERS 5 % IV SOLN: INTRAVENOUS | Status: AC

## 2023-10-09 MED ORDER — LACTATED RINGERS IV SOLN: INTRAVENOUS | Status: DC

## 2023-10-09 MED ORDER — LACTATED RINGERS IV BOLUS
1000.0000 mL | Freq: Once | INTRAVENOUS | Status: AC
  Administered 2023-10-09: 1000 mL via INTRAVENOUS

## 2023-10-09 MED ORDER — METOPROLOL TARTRATE 5 MG/5ML IV SOLN
5.0000 mg | INTRAVENOUS | Status: DC | PRN
Start: 2023-10-09 — End: 2023-10-19

## 2023-10-09 MED ORDER — INSULIN REGULAR(HUMAN) IN NACL 100-0.9 UT/100ML-% IV SOLN
INTRAVENOUS | Status: AC
  Administered 2023-10-09: 11 [IU]/h via INTRAVENOUS
  Filled 2023-10-09: qty 100

## 2023-10-09 MED ORDER — SODIUM CHLORIDE 0.9 % IV SOLN
2.0000 g | Freq: Once | INTRAVENOUS | Status: DC
  Filled 2023-10-09: qty 20

## 2023-10-09 MED ORDER — SODIUM CHLORIDE 0.9 % IV SOLN
2.0000 g | INTRAVENOUS | Status: AC
  Administered 2023-10-09 – 2023-10-13 (×5): 2 g via INTRAVENOUS
  Filled 2023-10-09 (×4): qty 20

## 2023-10-09 MED ORDER — POTASSIUM CHLORIDE 10 MEQ/100ML IV SOLN
10.0000 meq | INTRAVENOUS | Status: AC
Start: 2023-10-09 — End: 2023-10-09
  Administered 2023-10-09: 10 meq via INTRAVENOUS
  Filled 2023-10-09 (×2): qty 100

## 2023-10-09 NOTE — ED Triage Notes (Signed)
 Patient presents to ED via [facility] from [facility] side; hx of dementia  Called for seizure, not a seizure on EMS evaluation. Does have hx of tremors.  Pt was hyperglycemic - Memory care glucometer read HI, EMS glucometer read 565. Facility denies any recent infections.

## 2023-10-09 NOTE — ED Notes (Signed)
  CBG 457  

## 2023-10-09 NOTE — Hospital Course (Addendum)
 EMS responded and the blood glucose check was 565.  Vitals in the ED showed temperature of 97.8, respiration rate of 8, heart rate of 98, blood pressure 120/107, SpO2 of 94% on room air.  Serum sodium is 153, potassium 2.8, chloride 114, bicarb 27, BUN of 70, serum creatinine of 2.37, EGFR of 21, nonfasting blood glucose 589, WBC 13.5, hemoglobin 12.9, platelet 239.  Anion gap was not elevated at 12.  Magnesium  elevated at 2.8.  Lactic acid 1.7.  Serum osmolality was elevated at 382.  Procalcitonin was less than 0.10.  Beta hydroxybutyrate was minimally elevated at 0.52.  UA was positive for moderate leukocytes and nitrates.  Blood cultures x 2 are in process.  ED treatment: Ceftriaxone  2 g IV one-time dose, insulin  per Endo tool, LR 2 L bolus, potassium chloride  10 mill: X   73 year old female with history of prior insulin -dependent diabetes mellitus now non-insulin -dependent diabetes mellitus, hypertension, insomnia, anxiety, obstructive sleep apnea, dementia, hyperlipidemia, history of CVA, who presents emergency department for chief concerns of hyperglycemia.  01/12: admitted for hyperglycemia HHS, also UTI.  01/13: off insulin  gtt and onto sq insulin   1/15.  Mental status still impaired.  Kept her eyes closed the entire time.  CT scan of the head negative.  Spoke with patient's POA and stated her baseline mental status as she can get fussy but usually speaks. 1/16.  Patient seen while family was in the room and patient was more awake.  Periods where she is mumbling and periods where she is able to speak.  As per family she is sleeping a lot more than usual.  Sodium 143 1/17.  Patient more talkative this morning while I was in the room with nursing staff.  Able to understand more of the words that she was saying.  Sodium 141, creatinine 1.04 1/18.  Patient had a bowel movement today. 1/19.  I ordered labs today but they must be canceled for some reason.  Will reorder for tomorrow. 1/20.   Sodium 138, creatinine 1.09, hemoglobin 11.4.

## 2023-10-09 NOTE — Assessment & Plan Note (Addendum)
Mental status better the last few days.  Patient does have underlying dementia.  Likely worsened with hyperosmolar hyperglycemic state, hypernatremia and UTI.

## 2023-10-09 NOTE — Assessment & Plan Note (Addendum)
-   CPAP at night °

## 2023-10-09 NOTE — ED Notes (Signed)
 Patient transported to CT by float RN. Float RN to take patient up to ICU 19

## 2023-10-09 NOTE — ED Notes (Signed)
CBG 549

## 2023-10-09 NOTE — Assessment & Plan Note (Deleted)
Hydralazine 5 mg IV every 6 hours.  For SBP greater 160, 5 days ordered

## 2023-10-09 NOTE — H&P (Addendum)
 History and Physical   Leah Ritter FMW:[id number] DOB: 1951/08/30 DOA: 10/09/2023  PCP: [place]  Outpatient Specialists: Dr. Hester, cardiology Patient coming from: [facility] memory unit via EMS  I have personally briefly reviewed patient's old medical records in [facility] EMR.  Chief Concern: hyperglycemia  HPI: [name] is a 73 year old female with history of prior insulin -dependent diabetes mellitus now non-insulin -dependent diabetes mellitus, hypertension, insomnia, anxiety, obstructive sleep apnea, dementia with delusion misidentification disorder, hyperlipidemia, history of CVA, who presents emergency department for chief concerns of hyperglycemia.  EMS responded and the blood glucose check was 565.  Vitals in the ED showed temperature of 97.8, respiration rate of 8, heart rate of 98, blood pressure 120/107, SpO2 of 94% on room air.  Serum sodium is 153, potassium 2.8, chloride 114, bicarb 27, BUN of 70, serum creatinine of 2.37, EGFR of 21, nonfasting blood glucose 589, WBC 13.5, hemoglobin 12.9, platelet 239.  Anion gap was not elevated at 12.  Magnesium  elevated at 2.8.  Lactic acid 1.7.  Serum osmolality was elevated at 382.  Procalcitonin was less than 0.10.  Beta hydroxybutyrate was minimally elevated at 0.52.  UA was positive for moderate leukocytes and nitrates.  Blood cultures x 2 are in process.  ED treatment: Ceftriaxone  2 g IV one-time dose, insulin  per Endo tool, LR 2 L bolus, potassium chloride  10 mill: X 2. --------------------------------- At bedside, patient did not open her eyes to verbal stimuli.  Patient mumbles incoherently when I perform sternal rub and tries to open her eyes.  Daughter Leah Ritter was at bedside.  Daughter reports that there is no reports of patient not eating or drinking very well.  However daughter states that patient rarely drinks water and mostly she drinks unsweetened or sweetened tea, milk.  Social  history: Patient is currently at [facility] memory unit.  Daughter states that patient does not have history of tobacco, EtOH, recreational drug use.  Patient is retired and formerly was a associate professor.  ROS: Unable to complete due to acuity of patient presentation  ED Course: Discussed with EDP, patient requiring hospitalization for chief concerns of hyperosmolar hyperglycemic state.  Assessment/Plan  Principal Problem:   Hyperosmolar hyperglycemic state (HHS) (HCC) Active Problems:   AKI (acute kidney injury) (HCC)   Acute metabolic encephalopathy   UTI (urinary tract infection)   Uncontrolled type 2 diabetes mellitus with hypoglycemia, without long-term current use of insulin  (HCC)   Sleep apnea   A-fib (HCC)   GERD (gastroesophageal reflux disease)   HLD (hyperlipidemia)   Type II diabetes mellitus with renal manifestations (HCC)   Essential hypertension   Hypernatremia   Assessment and Plan:  * Hyperosmolar hyperglycemic state (HHS) (HCC) Suspect secondary to UTI in setting of patient with severe dementia Status post 2 L fluid bolus per EDP Continue with lactated ringer  infusion when CBG > 250 Can start D5 LR when CBG < 250 Continue insulin  per Endo tool Recheck BMP every 4 hours  UTI (urinary tract infection) Continue ceftriaxone  2 g IV daily to complete 5-day course  Acute metabolic encephalopathy Secondary to HHS however patient is not responding and/or moving appropriately on physical exam CT of the head without contrast has been ordered Check high sensitive troponin  AKI (acute kidney injury) (HCC) 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  For SBP greater 160, 5 days ordered  Chart reviewed.   DVT prophylaxis: Heparin  5000 units subcutaneous every 8 hours Code Status: DNR/DNI; MOST form at bedside and confirmed with  daughter Leah Ritter Diet: N.p.o. Family Communication: Updated daughters to set at bedside Disposition Plan: Pending clinical course Consults called: Diabetes coordinator Admission status: Stepdown, inpatient  Past Medical History:  Diagnosis Date   A-fib (HCC)    Anxiety    CHF (congestive heart failure) (HCC)    Diabetes (HCC)    GERD (gastroesophageal reflux disease)    Hypertension    Sleep apnea    Stroke [facility])    Past Surgical History:  Procedure Laterality Date   PARTIAL HYSTERECTOMY     tubligation     Social History:  reports that she has never smoked. She has never used smokeless tobacco. She reports that she does not currently use alcohol. She reports that she does not use drugs.  Allergies  Allergen Reactions   Oxycodone-Acetaminophen  Nausea And Vomiting    Other reaction(s): Vomiting   Ezetimibe Other (See Comments)    Reaction unknown   Lisinopril Other (See Comments)    Reaction unknown Other reaction(s): Unknown   Statins Other (See Comments)    Reaction unknown    Family History  Problem Relation Age of Onset   Diabetes Mother    Breast cancer Mother    Stroke Father    Hypertension Father    Kidney disease Brother    Cancer Brother    Family history: Family history reviewed and not pertinent.  Prior to Admission medications   Medication Sig Start Date End Date Taking? Authorizing Provider  amLODipine  (NORVASC ) 5 MG tablet Take 1 tablet (5 mg total) by mouth daily. 07/15/21   Pokhrel, Vernal, MD  bisacodyl  (DULCOLAX) 10 MG suppository Place 10 mg rectally as needed for moderate constipation.    [provider]  bisacodyl  (DULCOLAX) 5 MG EC tablet Take 2 tablets (10 mg total) by mouth daily as needed for moderate constipation. 02/02/23   Von Bellis, MD  feeding supplement (ENSURE ENLIVE / ENSURE PLUS) LIQD Take 237 mLs by mouth 3 (three) times daily between meals. 07/14/21   Pokhrel, Laxman, MD  LORazepam  (ATIVAN ) 0.5 MG tablet Take 1  tablet (0.5 mg total) by mouth every 4 (four) hours as needed for seizure, anxiety or sedation. 02/02/23   Von Bellis, MD  mirabegron  ER (MYRBETRIQ ) 25 MG TB24 tablet Take 25 mg by mouth daily.    [provider]  mirtazapine  (REMERON  SOL-TAB) 15 MG disintegrating tablet Take 1 tablet (15 mg total) by mouth at bedtime. 05/08/21   Amin, Sumayya, MD  nystatin  (MYCOSTATIN ) 100000 UNIT/ML suspension Take 5 mLs (500,000 Units total) by mouth 4 (four) times daily. 02/02/23   Von Bellis, MD  polyethylene glycol (MIRALAX  / GLYCOLAX ) 17 g packet Take 17 g by mouth 2 (two) times daily. 02/02/23   Von Bellis, MD  PRESCRIPTION MEDICATION Apply 2 mg topically 2 (two) times daily as needed (agitation or seizure activity). Lorazepam  2mg /mL Topical Gel    [provider]  QUEtiapine  (SEROQUEL ) 25 MG tablet Take 25 mg by mouth at bedtime.    [provider]   Physical Exam: Vitals:   10/09/23 1300 10/09/23 1330 10/09/23 1345 10/09/23 1447  BP: 132/86 135/68  133/69  Pulse:   81 84  Resp: 19 20 18 16   Temp:      TempSrc:      SpO2:   95% 94%   Constitutional: appears acutely ill, frail, altered states,  mumbling incoherently Eyes: Unable to assess as patient refuses to open her eyes.  She squeezes them tight shut when I try to open her lids. ENMT: Mucous membranes are dry.  Unable to assess posterior pharynx, dentition as patient refuses to open her mouth, even with attempts at gentle prying.  Unable to assess Neck: normal, supple, no masses, no thyromegaly Respiratory: clear to auscultation bilaterally, no wheezing, no crackles. Normal respiratory effort. No accessory muscle use.  Cardiovascular: Regular rate and rhythm, no murmurs / rubs / gallops. No extremity edema. 2+ pedal pulses. No carotid bruits.  Abdomen: Obese abdomen, no tenderness, no masses palpated, no hepatosplenomegaly. Bowel sounds positive.  Musculoskeletal: no clubbing / cyanosis. No joint deformity upper and  lower extremities.  Unable to assess range of motion, muscle tone.   no contractures, no atrophy. Normal muscle tone.  Skin: no rashes, lesions, ulcers. No induration Neurologic: Sensation intact. Strength 5/5 in all 4.  Psychiatric: Unable to assess judgment, insight, alertness, orientation, mood   EKG: independently reviewed, showing sinus rhythm with rate 92, QTc 469  Chest x-ray on Admission: I personally reviewed and I agree with radiologist reading as below.  DG Chest Portable 1 View Result Date: 10/09/2023 CLINICAL DATA:  Altered mental status. Possible diabetic ketoacidosis. EXAM: PORTABLE CHEST 1 VIEW COMPARISON:  One-view chest x-ray 01/21/2023 FINDINGS: The heart size is exaggerated by low lung volumes. Mild pulmonary vascular congestion is present. No significant edema is present. No effusion or pneumothorax is present. No focal airspace consolidation is present. IMPRESSION: Low lung volumes and mild pulmonary vascular congestion. Electronically Signed   By: Lonni Necessary M.D.   On: 10/09/2023 12:41   Labs on Admission: I have personally reviewed following labs  CBC: Recent Labs  Lab 10/09/23 1158  WBC 13.5*  NEUTROABS 11.3*  HGB 12.9  HCT 41.2  MCV 95.2  PLT 239   Basic Metabolic Panel: Recent Labs  Lab 10/09/23 1158  NA 153*  K 3.8  CL 114*  CO2 27  GLUCOSE 589*  BUN 70*  CREATININE 2.37*  CALCIUM  9.7  MG 2.8*   GFR: CrCl cannot be calculated (Unknown ideal weight.).  Liver Function Tests: Recent Labs  Lab 10/09/23 1158  AST 25  ALT 21  ALKPHOS 104  BILITOT 0.7  PROT 8.3*  ALBUMIN 3.9   Coagulation Profile: Recent Labs  Lab 10/09/23 1213  INR 1.2   CBG: Recent Labs  Lab 10/09/23 1224 10/09/23 1432  GLUCAP 549* 443*   Urine analysis:    Component Value Date/Time   COLORURINE YELLOW (A) 10/09/2023 1158   APPEARANCEUR CLOUDY (A) 10/09/2023 1158   LABSPEC 1.027 10/09/2023 1158   PHURINE 5.0 10/09/2023 1158   GLUCOSEU >=500 (A)  10/09/2023 1158   HGBUR MODERATE (A) 10/09/2023 1158   BILIRUBINUR NEGATIVE 10/09/2023 1158   KETONESUR NEGATIVE 10/09/2023 1158   PROTEINUR NEGATIVE 10/09/2023 1158   NITRITE POSITIVE (A) 10/09/2023 1158   LEUKOCYTESUR MODERATE (A) 10/09/2023 1158   CRITICAL CARE Performed by: Dr. Sherre  Total critical care time: 32 minutes  Critical care time was exclusive of separately billable procedures and treating other patients.  Critical care was necessary to treat or prevent imminent or life-threatening deterioration.  Critical care was time spent personally by me on the following activities: development of treatment plan with patient and/or surrogate as well as nursing, discussions with consultants, evaluation of patient's response to treatment, examination of patient, obtaining history from patient or surrogate, ordering and performing treatments and interventions,  ordering and review of laboratory studies, ordering and review of radiographic studies, pulse oximetry and re-evaluation of patient's condition.  This document was prepared using Dragon Voice Recognition software and may include unintentional dictation errors.  Dr. Sherre [facility]  If 7PM-7AM, please contact overnight-coverage provider If 7AM-7PM, please contact day attending provider [email or web address]  10/09/2023, 3:16 PM

## 2023-10-09 NOTE — Assessment & Plan Note (Deleted)
 Continue ceftriaxone 2 g IV daily to complete 5-day course

## 2023-10-09 NOTE — Assessment & Plan Note (Addendum)
 Last sodium 138 (off IV fluids).  Peak sodium 161.  Usually this is a poor prognostic factor.  Likely will be unable to keep up with nutritional needs.  Palliative care consultation appreciated.  Patient will go back to her facility on hospice services. Patient on dysphagia 1 diet with nectar thick liquids.

## 2023-10-09 NOTE — Assessment & Plan Note (Addendum)
Initially was on insulin drip but now on Semglee insulin 12 units at bedtime with sliding scale. Continue to check fingersticks qac and at bedtime.

## 2023-10-09 NOTE — ED Notes (Signed)
[id number] 

## 2023-10-09 NOTE — ED Notes (Signed)
 Advised nurse that patient has ready bed

## 2023-10-09 NOTE — ED Provider Notes (Signed)
 [facility] Provider Note    Event Date/Time   First MD Initiated Contact with Patient 10/09/23 1157     (approximate)   History   Hyperglycemia   HPI  Leah Ritter is a 73 y.o. female who presents to the ED for evaluation of Hyperglycemia   Review of medical DC summary from May.  Admitted for encephalopathy associated with HHS.  Demented patient presents from her SNF via EMS for evaluation of lesser generalized mentation and hyperglycemia.  History is limited as patient is encephalopathic, nonverbal.   Physical Exam   Triage Vital Signs: ED Triage Vitals [10/09/23 1153]  Encounter Vitals Group     BP (!) 120/107     Systolic BP Percentile      Diastolic BP Percentile      Pulse Rate 98     Resp (!) 8     Temp      Temp Source Oral     SpO2      Weight      Height      Head Circumference      Peak Flow      Pain Score      Pain Loc      Pain Education      Exclude from Growth Chart     Most recent vital signs: Vitals:   10/09/23 1345 10/09/23 1447  BP:  133/69  Pulse: 81 84  Resp: 18 16  Temp:    SpO2: 95% 94%    General:  no distress.  CV:  Good peripheral perfusion.  Resp:  Normal effort.  Abd:  No distention.  MSK:  No deformity noted.  Neuro:  No focal deficits appreciated. Other:     ED Results / Procedures / Treatments   Labs (all labs ordered are listed, but only abnormal results are displayed) Labs Reviewed  BETA-HYDROXYBUTYRIC ACID - Abnormal; Notable for the following components:      Result Value   Beta-Hydroxybutyric Acid 0.52 (*)    All other components within normal limits  BLOOD GAS, VENOUS - Abnormal; Notable for the following components:   Bicarbonate 33.3 (*)    Acid-Base Excess 6.0 (*)    All other components within normal limits  COMPREHENSIVE METABOLIC PANEL - Abnormal; Notable for the following components:   Sodium 153 (*)    Chloride 114 (*)    Glucose, Bld 589 (*)    BUN 70 (*)     Creatinine, Ser 2.37 (*)    Total Protein 8.3 (*)    GFR, Estimated 21 (*)    All other components within normal limits  CBC WITH DIFFERENTIAL/PLATELET - Abnormal; Notable for the following components:   WBC 13.5 (*)    Neutro Abs 11.3 (*)    All other components within normal limits  URINALYSIS, ROUTINE W REFLEX MICROSCOPIC - Abnormal; Notable for the following components:   Color, Urine YELLOW (*)    APPearance CLOUDY (*)    Glucose, UA >=500 (*)    Hgb urine dipstick MODERATE (*)    Nitrite POSITIVE (*)    Leukocytes,Ua MODERATE (*)    Bacteria, UA MANY (*)    All other components within normal limits  MAGNESIUM  - Abnormal; Notable for the following components:   Magnesium  2.8 (*)    All other components within normal limits  OSMOLALITY - Abnormal; Notable for the following components:   Osmolality 382 (*)    All other components within normal limits  PROTIME-INR -  Abnormal; Notable for the following components:   Prothrombin Time 15.7 (*)    All other components within normal limits  CBG MONITORING, ED - Abnormal; Notable for the following components:   Glucose-Capillary 549 (*)    All other components within normal limits  CBG MONITORING, ED - Abnormal; Notable for the following components:   Glucose-Capillary 443 (*)    All other components within normal limits  CULTURE, BLOOD (ROUTINE X 2)  CULTURE, BLOOD (ROUTINE X 2)  URINE CULTURE  LACTIC ACID, PLASMA  APTT  PROCALCITONIN  POTASSIUM  POTASSIUM  POTASSIUM  BASIC METABOLIC PANEL  BASIC METABOLIC PANEL  BASIC METABOLIC PANEL  TROPONIN I (HIGH SENSITIVITY)    EKG Sinus rhythm with a rate of 92 bpm.  Normal axis.  No STEMI.  RADIOLOGY CXR interpreted by me without evidence of acute cardiopulmonary pathology.  Official radiology report(s): DG Chest Portable 1 View Result Date: 10/09/2023 CLINICAL DATA:  Altered mental status. Possible diabetic ketoacidosis. EXAM: PORTABLE CHEST 1 VIEW COMPARISON:  One-view  chest x-ray 01/21/2023 FINDINGS: The heart size is exaggerated by low lung volumes. Mild pulmonary vascular congestion is present. No significant edema is present. No effusion or pneumothorax is present. No focal airspace consolidation is present. IMPRESSION: Low lung volumes and mild pulmonary vascular congestion. Electronically Signed   By: Lonni Necessary M.D.   On: 10/09/2023 12:41    PROCEDURES and INTERVENTIONS:  .Critical Care  Performed by: Claudene Rover, MD Authorized by: Claudene Rover, MD   Critical care provider statement:    Critical care time (minutes):  30   Critical care time was exclusive of:  Separately billable procedures and treating other patients   Critical care was necessary to treat or prevent imminent or life-threatening deterioration of the following conditions:  Metabolic crisis and endocrine crisis   Critical care was time spent personally by me on the following activities:  Development of treatment plan with patient or surrogate, discussions with consultants, evaluation of patient's response to treatment, examination of patient, ordering and review of laboratory studies, ordering and review of radiographic studies, ordering and performing treatments and interventions, pulse oximetry, re-evaluation of patient's condition and review of old charts .1-3 Lead EKG Interpretation  Performed by: Claudene Rover, MD Authorized by: Claudene Rover, MD     Interpretation: normal     ECG rate:  90   ECG rate assessment: normal     Rhythm: sinus rhythm     Ectopy: none     Conduction: normal     Medications  insulin  regular, human (MYXREDLIN ) 100 units/ 100 mL infusion (11 Units/hr Intravenous New Bag/Given 10/09/23 1439)  dextrose  50 % solution 0-50 mL (has no administration in time range)  potassium chloride  10 mEq in 100 mL IVPB (10 mEq Intravenous New Bag/Given 10/09/23 1444)  dextrose  5 % in lactated ringers  infusion ( Intravenous New Bag/Given 10/09/23 1437)  heparin   injection 5,000 Units (has no administration in time range)  cefTRIAXone  (ROCEPHIN ) 2 g in sodium chloride  0.9 % 100 mL IVPB (has no administration in time range)  hydrALAZINE  (APRESOLINE ) injection 5 mg (has no administration in time range)  lactated ringers  infusion (has no administration in time range)  lactated ringers  bolus 1,000 mL (1,000 mLs Intravenous New Bag/Given 10/09/23 1224)  lactated ringers  bolus 1,000 mL (1,000 mLs Intravenous New Bag/Given 10/09/23 1349)     IMPRESSION / MDM / ASSESSMENT AND PLAN / ED COURSE  I reviewed the triage vital signs and the nursing notes.  Differential  diagnosis includes, but is not limited to, DKA, HHS, sepsis  {Patient presents with symptoms of an acute illness or injury that is potentially life-threatening.  Patient presents to the ED encephalopathic and hyperglycemic with signs of HHS and sepsis from a UTI requiring medical admission.  Mild leukocytosis normal lactic.  Negative procalcitonin but otherwise concerning for infection with cath urinalysis.  Possible etiology of her HHS.  Started on insulin  and fluid resuscitation.  Consult medicine for admission.  Clinical Course as of 10/09/23 1519  Sun Oct 09, 2023  1412 Reassessed.  Daughter at the bedside.  She would not want the patient transferred to [facility].  So we will admit the patient here [DS]  1429 Consult with medicine who agrees to admit. [DS]    Clinical Course User Index [DS] Claudene Rover, MD     FINAL CLINICAL IMPRESSION(S) / ED DIAGNOSES   Final diagnoses:  Hyperglycemia  AKI (acute kidney injury) (HCC)  Hypernatremia     Rx / DC Orders   ED Discharge Orders     None        Note:  This document was prepared using Dragon voice recognition software and may include unintentional dictation errors.   Claudene Rover, MD 10/09/23 [phone]

## 2023-10-09 NOTE — Assessment & Plan Note (Deleted)
 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

## 2023-10-10 DIAGNOSIS — E1100 Type 2 diabetes mellitus with hyperosmolarity without nonketotic hyperglycemic-hyperosmolar coma (NKHHC): Secondary | ICD-10-CM | POA: Diagnosis not present

## 2023-10-10 LAB — POTASSIUM
Potassium: 3.5 mmol/L (ref 3.5–5.1)
Potassium: 3.5 mmol/L (ref 3.5–5.1)
Potassium: 3.7 mmol/L (ref 3.5–5.1)

## 2023-10-10 LAB — BASIC METABOLIC PANEL
Anion gap: 13 (ref 5–15)
Anion gap: 11 (ref 5–15)
Anion gap: 12 (ref 5–15)
BUN: 51 mg/dL — ABNORMAL HIGH (ref 8–23)
BUN: 49 mg/dL — ABNORMAL HIGH (ref 8–23)
BUN: 44 mg/dL — ABNORMAL HIGH (ref 8–23)
CO2: 25 mmol/L (ref 22–32)
CO2: 28 mmol/L (ref 22–32)
CO2: 28 mmol/L (ref 22–32)
Calcium: 9.4 mg/dL (ref 8.9–10.3)
Calcium: 9.4 mg/dL (ref 8.9–10.3)
Calcium: 9.3 mg/dL (ref 8.9–10.3)
Chloride: 122 mmol/L — ABNORMAL HIGH (ref 98–111)
Chloride: 122 mmol/L — ABNORMAL HIGH (ref 98–111)
Chloride: 120 mmol/L — ABNORMAL HIGH (ref 98–111)
Creatinine, Ser: 1.37 mg/dL — ABNORMAL HIGH (ref 0.44–1.00)
Creatinine, Ser: 1.31 mg/dL — ABNORMAL HIGH (ref 0.44–1.00)
Creatinine, Ser: 1.23 mg/dL — ABNORMAL HIGH (ref 0.44–1.00)
GFR, Estimated: 41 mL/min — ABNORMAL LOW (ref >60)
GFR, Estimated: 43 mL/min — ABNORMAL LOW (ref >60)
GFR, Estimated: 46 mL/min — ABNORMAL LOW (ref >60)
Glucose, Bld: 141 mg/dL — ABNORMAL HIGH (ref 70–99)
Glucose, Bld: 151 mg/dL — ABNORMAL HIGH (ref 70–99)
Glucose, Bld: 174 mg/dL — ABNORMAL HIGH (ref 70–99)
Potassium: 3.7 mmol/L (ref 3.5–5.1)
Potassium: 3.5 mmol/L (ref 3.5–5.1)
Potassium: 3.8 mmol/L (ref 3.5–5.1)
Sodium: 160 mmol/L — ABNORMAL HIGH (ref 135–145)
Sodium: 161 mmol/L (ref 135–145)
Sodium: 160 mmol/L — ABNORMAL HIGH (ref 135–145)

## 2023-10-10 LAB — GLUCOSE, CAPILLARY
Glucose-Capillary: 121 mg/dL — ABNORMAL HIGH (ref 70–99)
Glucose-Capillary: 126 mg/dL — ABNORMAL HIGH (ref 70–99)
Glucose-Capillary: 127 mg/dL — ABNORMAL HIGH (ref 70–99)
Glucose-Capillary: 134 mg/dL — ABNORMAL HIGH (ref 70–99)
Glucose-Capillary: 134 mg/dL — ABNORMAL HIGH (ref 70–99)
Glucose-Capillary: 137 mg/dL — ABNORMAL HIGH (ref 70–99)
Glucose-Capillary: 138 mg/dL — ABNORMAL HIGH (ref 70–99)
Glucose-Capillary: 140 mg/dL — ABNORMAL HIGH (ref 70–99)
Glucose-Capillary: 143 mg/dL — ABNORMAL HIGH (ref 70–99)
Glucose-Capillary: 143 mg/dL — ABNORMAL HIGH (ref 70–99)
Glucose-Capillary: 145 mg/dL — ABNORMAL HIGH (ref 70–99)
Glucose-Capillary: 165 mg/dL — ABNORMAL HIGH (ref 70–99)
Glucose-Capillary: 185 mg/dL — ABNORMAL HIGH (ref 70–99)
Glucose-Capillary: 196 mg/dL — ABNORMAL HIGH (ref 70–99)
Glucose-Capillary: 209 mg/dL — ABNORMAL HIGH (ref 70–99)

## 2023-10-10 LAB — BLOOD GAS, VENOUS
Bicarbonate: 33.3 mmol/L — ABNORMAL HIGH (ref 20.0–28.0)
O2 Saturation: 23.3 mmol/L — ABNORMAL HIGH (ref 0.0–2.0)
Patient temperature: 37
Patient temperature: 37 %
pCO2, Ven: 59 mm[Hg] (ref 44–60)
pH, Ven: 7.36 (ref 7.25–7.43)
pO2, Ven: 33.3 mmol/L — ABNORMAL HIGH (ref 32–45)

## 2023-10-10 LAB — HEMOGLOBIN A1C
Hgb A1c MFr Bld: 11.4 % — ABNORMAL HIGH (ref 4.8–5.6)
Mean Plasma Glucose: 280.48 mg/dL

## 2023-10-10 LAB — SODIUM
Sodium: 153 mmol/L — ABNORMAL HIGH (ref 135–145)
Sodium: 155 mmol/L — ABNORMAL HIGH (ref 135–145)

## 2023-10-10 LAB — OSMOLALITY: Osmolality: 346 mosm/kg (ref 275–295)

## 2023-10-10 MED ORDER — INSULIN GLARGINE-YFGN 100 UNIT/ML ~~LOC~~ SOLN
12.0000 [IU] | Freq: Every day | SUBCUTANEOUS | Status: DC
  Administered 2023-10-10 – 2023-10-17 (×8): 12 [IU] via SUBCUTANEOUS
  Filled 2023-10-10 (×9): qty 0.12

## 2023-10-10 MED ORDER — CHLORHEXIDINE GLUCONATE CLOTH 2 % EX PADS
6.0000 | MEDICATED_PAD | Freq: Every day | CUTANEOUS | Status: DC
  Administered 2023-10-10: 6 via TOPICAL

## 2023-10-10 MED ORDER — SODIUM CHLORIDE 0.45 % IV SOLN: INTRAVENOUS | Status: AC

## 2023-10-10 MED ORDER — INSULIN ASPART 100 UNIT/ML IJ SOLN
0.0000 [IU] | Freq: Every day | INTRAMUSCULAR | Status: DC
  Administered 2023-10-10: 2 [IU] via SUBCUTANEOUS
  Administered 2023-10-11: 4 [IU] via SUBCUTANEOUS
  Administered 2023-10-17: 2 [IU] via SUBCUTANEOUS
  Filled 2023-10-10 (×3): qty 1

## 2023-10-10 MED ORDER — INSULIN ASPART 100 UNIT/ML IJ SOLN
0.0000 [IU] | Freq: Three times a day (TID) | INTRAMUSCULAR | Status: DC
  Administered 2023-10-10: 2 [IU] via SUBCUTANEOUS
  Administered 2023-10-11: 3 [IU] via SUBCUTANEOUS
  Administered 2023-10-11: 5 [IU] via SUBCUTANEOUS
  Administered 2023-10-12: 8 [IU] via SUBCUTANEOUS
  Administered 2023-10-12 (×2): 3 [IU] via SUBCUTANEOUS
  Administered 2023-10-13: 2 [IU] via SUBCUTANEOUS
  Administered 2023-10-13: 8 [IU] via SUBCUTANEOUS
  Administered 2023-10-14 (×2): 5 [IU] via SUBCUTANEOUS
  Administered 2023-10-14: 2 [IU] via SUBCUTANEOUS
  Administered 2023-10-15: 5 [IU] via SUBCUTANEOUS
  Administered 2023-10-15 (×2): 3 [IU] via SUBCUTANEOUS
  Administered 2023-10-16: 2 [IU] via SUBCUTANEOUS
  Administered 2023-10-16: 8 [IU] via SUBCUTANEOUS
  Administered 2023-10-17 – 2023-10-18 (×3): 3 [IU] via SUBCUTANEOUS
  Administered 2023-10-18: 5 [IU] via SUBCUTANEOUS
  Filled 2023-10-10 (×20): qty 1

## 2023-10-10 MED ORDER — POTASSIUM CHLORIDE 10 MEQ/100ML IV SOLN
10.0000 meq | INTRAVENOUS | Status: AC
  Administered 2023-10-10 (×2): 10 meq via INTRAVENOUS
  Filled 2023-10-10 (×3): qty 100

## 2023-10-10 NOTE — Plan of Care (Signed)
  Problem: Education: Goal: Ability to describe self-care measures that may prevent or decrease complications (Diabetes Survival Skills Education) will improve 10/10/2023 0414 by Debby Bailer, RN Outcome: Progressing 10/10/2023 0412 by Debby Bailer, RN Outcome: Progressing Goal: Individualized Educational Video(s) Outcome: Progressing   Problem: Coping: Goal: Ability to adjust to condition or change in health will improve 10/10/2023 0414 by Debby Bailer, RN Outcome: Progressing 10/10/2023 0412 by Debby Bailer, RN Outcome: Progressing   Problem: Fluid Volume: Goal: Ability to maintain a balanced intake and output will improve Outcome: Progressing   Problem: Health Behavior/Discharge Planning: Goal: Ability to identify and utilize available resources and services will improve 10/10/2023 0414 by Debby Bailer, RN Outcome: Progressing 10/10/2023 0412 by Debby Bailer, RN Outcome: Progressing Goal: Ability to manage health-related needs will improve Outcome: Progressing   Problem: Health Behavior/Discharge Planning: Goal: Ability to identify and utilize available resources and services will improve 10/10/2023 0414 by Debby Bailer, RN Outcome: Progressing 10/10/2023 0412 by Debby Bailer, RN Outcome: Progressing   Problem: Health Behavior/Discharge Planning: Goal: Ability to manage health-related needs will improve Outcome: Progressing   Problem: Education: Goal: Individualized Educational Video(s) Outcome: Progressing

## 2023-10-10 NOTE — Progress Notes (Addendum)
 PROGRESS NOTE    Leah Ritter   FMW:[id number] DOB: February 25, 1951  DOA: 10/09/2023 Date of Service: 10/10/23 which is hospital day 1  PCP: [facility] course / significant events:   HPI: [name] is a 73 year old female with history of prior insulin -dependent diabetes mellitus now non-insulin -dependent diabetes mellitus, hypertension, insomnia, anxiety, obstructive sleep apnea, dementia, hyperlipidemia, history of CVA, who presents emergency department for chief concerns of hyperglycemia.  01/12: admitted for hyperglycemia HHS, also UTI.  01/13: off insulin  gtt and onto sq insulin       Consultants:  none  Procedures/Surgeries: none      ASSESSMENT & PLAN:   Hyperosmolar hyperglycemic state (HHS) (HCC) Suspect secondary to UTI in setting of patient with severe dementia IV fluids per protocol and accounting for hypernatremia, see below A1C 11.4 likely will need to be back on insulin  long-term on discharge  Basal insulin  Semglee  12 units at bedtime Moderate sliding scale achs   UTI (urinary tract infection) Continue ceftriaxone  2 g IV daily to complete 5-day course Pending culture  Acute metabolic encephalopathy - improved  Complicated by underlying dementia  Secondary to HHS however on admission patient was not responding and/or moving appropriately on physical exam No concerns on CT head, troponin Monitor    AKI (acute kidney injury) (HCC) 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  For SBP greater 160, 5 days ordered      DVT prophylaxis: heparin  IV fluids: see above re: continuous IV fluids  Nutrition: puree w/ thickened liquids  Central lines / invasive devices: none  Code Status: DNR ACP documentation reviewed:  none on file  in [facility]  TOC needs: TBD Barriers to dispo / significant pending items: Glc stabilisation and improvement in sodium anticiapte possible dc next day or two              Subjective / Brief ROS:  Patient not able to contribute d/t mental/congitive state  Daughter at bedside has no concerns for new/worsening changes   Family Communication: daughter at bedside    Objective Findings:  Vitals:   10/10/23 1400 10/10/23 1500 10/10/23 1600 10/10/23 1700  BP: (!) 166/82 (!) 163/71  (!) 167/82  Pulse: 73 78 86 83  Resp: 20 20 19  (!) 25  Temp:      TempSrc:      SpO2: 94% 95% 96% 95%    Intake/Output Summary (Last 24 hours) at 10/10/2023 1719 Last data filed at 10/10/2023 1300 Gross per 24 hour  Intake 1941.98 ml  Output 550 ml  Net 1391.98 ml   There were no vitals filed for this visit.  Examination:  Physical Exam Constitutional:      General: She is not in acute distress. Cardiovascular:     Rate and Rhythm: Normal rate and regular rhythm.  Pulmonary:     Effort: Pulmonary effort is normal.     Breath sounds: Normal breath sounds.  Musculoskeletal:     Right lower leg: No edema.     Left lower leg: No edema.  Skin:    General: Skin is warm and dry.  Neurological:     Mental Status: She is alert. Mental status is at baseline. She is disoriented.  Psychiatric:  Behavior: Behavior normal.          Scheduled Medications:   Chlorhexidine  Gluconate Cloth  6 each Topical Daily   heparin   5,000 Units Subcutaneous Q8H   insulin  aspart  0-15 Units Subcutaneous TID WC   insulin  aspart  0-5 Units Subcutaneous QHS   insulin  glargine-yfgn  12 Units Subcutaneous QHS    Continuous Infusions:  sodium chloride  150 mL/hr at 10/10/23 1553   cefTRIAXone  (ROCEPHIN )  IV 2 g (10/10/23 1508)    PRN Medications:  dextrose , hydrALAZINE , metoprolol  tartrate  Antimicrobials from admission:  Anti-infectives (From admission, onward)    Start     Dose/Rate Route  Frequency Ordered Stop   10/09/23 1500  cefTRIAXone  (ROCEPHIN ) 2 g in sodium chloride  0.9 % 100 mL IVPB        2 g 200 mL/hr over 30 Minutes Intravenous Every 24 hours 10/09/23 1432 10/14/23 1459   10/09/23 1415  cefTRIAXone  (ROCEPHIN ) 2 g in sodium chloride  0.9 % 100 mL IVPB  Status:  Discontinued        2 g 200 mL/hr over 30 Minutes Intravenous  Once 10/09/23 1413 10/09/23 1432           Data Reviewed:  I have personally reviewed the following...  CBC: Recent Labs  Lab 10/09/23 1158  WBC 13.5*  NEUTROABS 11.3*  HGB 12.9  HCT 41.2  MCV 95.2  PLT 239   Basic Metabolic Panel: Recent Labs  Lab 10/09/23 1158 10/09/23 1550 10/09/23 1649 10/09/23 2108 10/10/23 0111 10/10/23 0351 10/10/23 0829 10/10/23 1405  NA 153*  --  158* 160* 160* 161* 160* 153*  K 3.8   < > 3.4* 3.7 3.7 3.5 3.8 3.5  CL 114*  --  118* 122* 122* 122* 120*  --   CO2 27  --  25 24 25 28 28   --   GLUCOSE 589*  --  357* 135* 141* 151* 174*  --   BUN 70*  --  55* 55* 51* 49* 44*  --   CREATININE 2.37*  --  1.98* 1.59* 1.37* 1.31* 1.23*  --   CALCIUM  9.7  --  9.5 9.5 9.4 9.4 9.3  --   MG 2.8*  --   --   --   --   --   --   --    < > = values in this interval not displayed.   GFR: CrCl cannot be calculated (Unknown ideal weight.). Liver Function Tests: Recent Labs  Lab 10/09/23 1158  AST 25  ALT 21  ALKPHOS 104  BILITOT 0.7  PROT 8.3*  ALBUMIN 3.9   No results for input(s): LIPASE, AMYLASE in the last 168 hours. No results for input(s): AMMONIA in the last 168 hours. Coagulation Profile: Recent Labs  Lab 10/09/23 1213  INR 1.2   Cardiac Enzymes: No results for input(s): CKTOTAL, CKMB, CKMBINDEX, TROPONINI in the last 168 hours. BNP (last 3 results) No results for input(s): PROBNP in the last 8760 hours. HbA1C: Recent Labs    10/10/23 0112  HGBA1C 11.4*   CBG: Recent Labs  Lab 10/10/23 1114 10/10/23 1215 10/10/23 1353 10/10/23 1549 10/10/23 1717  GLUCAP  165* 196* 185* 137* 127*   Lipid Profile: No results for input(s): CHOL, HDL, LDLCALC, TRIG, CHOLHDL, LDLDIRECT in the last 72 hours. Thyroid Function Tests: No results for input(s): TSH, T4TOTAL, FREET4, T3FREE, THYROIDAB in the last 72 hours. Anemia Panel: No results for input(s): VITAMINB12, FOLATE, FERRITIN, TIBC, IRON, RETICCTPCT in the last  72 hours. Most Recent Urinalysis On File:     Component Value Date/Time   COLORURINE YELLOW (A) 10/09/2023 1158   APPEARANCEUR CLOUDY (A) 10/09/2023 1158   LABSPEC 1.027 10/09/2023 1158   PHURINE 5.0 10/09/2023 1158   GLUCOSEU >=500 (A) 10/09/2023 1158   HGBUR MODERATE (A) 10/09/2023 1158   BILIRUBINUR NEGATIVE 10/09/2023 1158   KETONESUR NEGATIVE 10/09/2023 1158   PROTEINUR NEGATIVE 10/09/2023 1158   NITRITE POSITIVE (A) 10/09/2023 1158   LEUKOCYTESUR MODERATE (A) 10/09/2023 1158   Sepsis Labs: @LABRCNTIP (procalcitonin:4,lacticidven:4) Microbiology: Recent Results (from the past 240 hours)  Blood Culture (routine x 2)     Status: None (Preliminary result)   Collection Time: 10/09/23 12:13 PM   Specimen: BLOOD  Result Value Ref Range Status   Specimen Description BLOOD RIGHT ANTECUBITAL  Final   Special Requests   Final    BOTTLES DRAWN AEROBIC AND ANAEROBIC Blood Culture adequate volume   Culture   Final    NO GROWTH < 24 HOURS Performed at [facility], [address] [place], [place] [address]    Report Status PENDING  Incomplete  Blood Culture (routine x 2)     Status: None (Preliminary result)   Collection Time: 10/09/23 12:28 PM   Specimen: BLOOD  Result Value Ref Range Status   Specimen Description BLOOD RIGHT ANTECUBITAL  Final   Special Requests   Final    BOTTLES DRAWN AEROBIC AND ANAEROBIC Blood Culture results may not be optimal due to an inadequate volume of blood received in culture bottles   Culture   Final    NO GROWTH < 24 HOURS Performed at [facility],  [address] [place], [place] [address]    Report Status PENDING  Incomplete  Urine Culture     Status: Abnormal (Preliminary result)   Collection Time: 10/09/23  2:14 PM   Specimen: Urine, Random  Result Value Ref Range Status   Specimen Description   Final    URINE, RANDOM Performed at [facility], [address], [place], [place] [address]    Special Requests   Final    NONE Performed at [facility], [address] [place], [place] [address]    Culture (A)  Final    70,000 COLONIES/mL ESCHERICHIA COLI SUSCEPTIBILITIES TO FOLLOW Performed at [facility], [address], [place], [place] [address]    Report Status PENDING  Incomplete  MRSA Next Gen by PCR, Nasal     Status: None   Collection Time: 10/09/23  4:31 PM   Specimen: Nasal Mucosa; Nasal Swab  Result Value Ref Range Status   MRSA by PCR Next Gen NOT DETECTED NOT DETECTED Final    Comment: (NOTE) The GeneXpert MRSA Assay (FDA approved for NASAL specimens only), is one component of a comprehensive MRSA colonization surveillance program. It is not intended to diagnose MRSA infection nor to guide or monitor treatment for MRSA infections. Test performance is not FDA approved in patients less than 42 years old. Performed at [facility], [address], [place], [place] [address]       Radiology Studies last 3 days: CT HEAD WO CONTRAST ( ) Result Date: 10/09/2023 CLINICAL DATA:  Stroke suspected, altered mental status EXAM: CT HEAD WITHOUT CONTRAST TECHNIQUE: Contiguous axial images were obtained from the base of the skull through the vertex without intravenous contrast. RADIATION DOSE REDUCTION: This exam was performed according to the departmental dose-optimization program which includes automated exposure control, adjustment of the  mA and/or kV according to patient size and/or use of iterative reconstruction technique. COMPARISON:  02/03/2023 FINDINGS: Brain: No evidence of  acute infarction, hemorrhage, mass, mass effect, or midline shift. No hydrocephalus or extra-axial fluid collection. Redemonstrated encephalomalacia in the right frontal lobe. Periventricular white matter changes, likely the sequela of chronic small vessel ischemic disease. Vascular: No hyperdense vessel. Skull: Negative for fracture or focal lesion. Sinuses/Orbits: No acute finding. Other: The mastoid air cells are well aerated. IMPRESSION: No acute intracranial process. Electronically Signed   By: Donald Campion M.D.   On: 10/09/2023 16:17   DG Chest Portable 1 View Result Date: 10/09/2023 CLINICAL DATA:  Altered mental status. Possible diabetic ketoacidosis. EXAM: PORTABLE CHEST 1 VIEW COMPARISON:  One-view chest x-ray 01/21/2023 FINDINGS: The heart size is exaggerated by low lung volumes. Mild pulmonary vascular congestion is present. No significant edema is present. No effusion or pneumothorax is present. No focal airspace consolidation is present. IMPRESSION: Low lung volumes and mild pulmonary vascular congestion. Electronically Signed   By: Lonni Necessary M.D.   On: 10/09/2023 12:41         Jeoffrey Eleazer, DO [facility] 10/10/2023, 5:19 PM    Dictation software may have been used to generate the above note. Typos may occur and escape review in typed/dictated notes. Please contact Dr Marsa directly for clarity if needed.  Staff may message me via secure chat in Epic  but this may not receive an immediate response,  please page me for urgent matters!  If 7PM-7AM, please contact night coverage [email or web address]

## 2023-10-10 NOTE — Inpatient Diabetes Management (Addendum)
 Inpatient Diabetes Program Recommendations  AACE/ADA: New Consensus Statement on Inpatient Glycemic Control (2015)  Target Ranges:  Prepandial:   less than 140 mg/dL      Peak postprandial:   less than 180 mg/dL (1-2 hours)      Critically ill patients:  140 - 180 mg/dL    Latest Reference Range & Units 01/24/23 06:19 10/10/23 01:12  Hemoglobin A1C 4.8 - 5.6 % 12.6 (H) 11.4 (H)  280 mg/dl    Latest Reference Range & Units 10/09/23 11:58  Sodium 135 - 145 mmol/L 153 (H)  Potassium 3.5 - 5.1 mmol/L 3.8  Chloride 98 - 111 mmol/L 114 (H)  CO2 22 - 32 mmol/L 27  Glucose 70 - 99 mg/dL 410 (HH)  BUN 8 - 23 mg/dL 70 (H)  Creatinine 9.55 - 1.00 mg/dL 7.62 (H)  Calcium  8.9 - 10.3 mg/dL 9.7  Anion gap 5 - 15  12    Latest Reference Range & Units 10/09/23 12:24 10/09/23 14:32 10/09/23 15:37 10/09/23 16:26 10/09/23 17:53 10/09/23 18:52 10/09/23 20:01 10/09/23 20:59  Glucose-Capillary 70 - 99 mg/dL 450 (HH) 556 (H)  IV lnsulin Drip Started 457 (H) 335 (H) 258 (H) 221 (H) 163 (H) 134 (H)    Latest Reference Range & Units 10/09/23 21:56 10/09/23 23:03 10/10/23 00:05 10/10/23 01:09 10/10/23 02:14 10/10/23 03:17 10/10/23 04:23 10/10/23 05:36  Glucose-Capillary 70 - 99 mg/dL 837 (H) 892 (H) 878 (H) 126 (H) 140 (H) 134 (H) 138 (H) 143 (H)  IV Insulin  Drip Running  (H): Data is abnormally high  Admit from [facility] memory unit via EMS for Hyperglycemia Hyperosmolar hyperglycemic state  UTI  History: DM2, Dementia with delusion misidentification disorder, CVA   Home DM Meds: None   Current Orders: IV Insulin  Drip     MD- Note 3:51am BMET shows Glucose 151/ Anion Gap 11/ CO2 level 28  When you allow pt to transition to SQ Insulin , please consider:  1. Start Semglee  12 units daily (0.15 units/kg) Can Stop IV Insulin  Drip 2 hours after Semglee  on board  2. Start Novolog  Moderate Correction Scale/ SSI (0-15 units) TID AC + HS  Addendum 11am--May need Insulin  at time of discharge  given A1c is currently 11.4 and pt admitted with glucose of 589, however, after discussion with pt's Daughter at bedside, daughter told me when pt's mental status returns to baseline that pt is often easily agitated when people try to bathe her or give her meds.  Staff at the memory care unit often have to put meds in applesauce or pudding to get pt to take her meds.  Dtr told me pt often refuses fingerstick CBGs.  Dtr told me pt was receiving Hospice care at the memory care facility.  It may be easiest to prescribe orals for OP therapy.  Perhaps low dose Sulfonylurea like Amaryl 2 mg daily or DPP-4 inhibitor like Tradjenta 5 mg daily?     --Will follow patient during hospitalization--  Adina Rudolpho Arrow RN, MSN, CDCES Diabetes Coordinator Inpatient Glycemic Control Team Team Pager: [phone] (8a-5p)

## 2023-10-11 DIAGNOSIS — E1100 Type 2 diabetes mellitus with hyperosmolarity without nonketotic hyperglycemic-hyperosmolar coma (NKHHC): Secondary | ICD-10-CM | POA: Diagnosis not present

## 2023-10-11 LAB — BASIC METABOLIC PANEL
Anion gap: 13 (ref 5–15)
BUN: 28 mg/dL — ABNORMAL HIGH (ref 8–23)
CO2: 24 mmol/L (ref 22–32)
Calcium: 9.1 mg/dL (ref 8.9–10.3)
Chloride: 114 mmol/L — ABNORMAL HIGH (ref 98–111)
Creatinine, Ser: 1.04 mg/dL — ABNORMAL HIGH (ref 0.44–1.00)
GFR, Estimated: 57 mL/min — ABNORMAL LOW (ref >60)
Glucose, Bld: 154 mg/dL — ABNORMAL HIGH (ref 70–99)
Potassium: 3.5 mmol/L (ref 3.5–5.1)
Sodium: 151 mmol/L — ABNORMAL HIGH (ref 135–145)

## 2023-10-11 LAB — CBC
HCT: 39.6 % (ref 36.0–46.0)
Hemoglobin: 12.7 g/dL (ref 12.0–15.0)
MCH: 29.7 pg (ref 26.0–34.0)
MCHC: 32.1 g/dL (ref 30.0–36.0)
MCV: 92.7 fL (ref 80.0–100.0)
Platelets: 209 10*3/uL (ref 150–400)
RBC: 4.27 MIL/uL (ref 3.87–5.11)
RDW: 13.5 % (ref 11.5–15.5)
WBC: 9.4 10*3/uL (ref 4.0–10.5)
nRBC: 0 % (ref 0.0–0.2)

## 2023-10-11 LAB — SODIUM
Sodium: 146 mmol/L — ABNORMAL HIGH (ref 135–145)
Sodium: 148 mmol/L — ABNORMAL HIGH (ref 135–145)
Sodium: 152 mmol/L — ABNORMAL HIGH (ref 135–145)
Sodium: 154 mmol/L — ABNORMAL HIGH (ref 135–145)

## 2023-10-11 LAB — GLUCOSE, CAPILLARY
Glucose-Capillary: 174 mg/dL — ABNORMAL HIGH (ref 70–99)
Glucose-Capillary: 204 mg/dL — ABNORMAL HIGH (ref 70–99)
Glucose-Capillary: 115 mg/dL — ABNORMAL HIGH (ref 70–99)
Glucose-Capillary: 350 mg/dL — ABNORMAL HIGH (ref 70–99)

## 2023-10-11 LAB — URINE CULTURE: Culture: 70000 — AB

## 2023-10-11 LAB — POTASSIUM: Potassium: 3.6 mmol/L (ref 3.5–5.1)

## 2023-10-11 MED ORDER — DOCUSATE SODIUM 100 MG PO CAPS: 100.0000 mg | ORAL_CAPSULE | Freq: Two times a day (BID) | ORAL | Status: DC | PRN

## 2023-10-11 MED ORDER — SODIUM CHLORIDE 0.45 % IV SOLN: INTRAVENOUS | Status: AC

## 2023-10-11 MED ORDER — POLYETHYLENE GLYCOL 3350 17 G PO PACK: 17.0000 g | PACK | Freq: Every day | ORAL | Status: DC | PRN

## 2023-10-11 NOTE — Progress Notes (Signed)
 PROGRESS NOTE    [name]   FMW:[id number] DOB: 09-21-1951  DOA: 10/09/2023 Date of Service: 10/11/23 which is hospital day 2  PCP: [facility] course / significant events:   HPI: [name] is a 73 year old female with history of prior insulin -dependent diabetes mellitus now non-insulin -dependent diabetes mellitus, hypertension, insomnia, anxiety, obstructive sleep apnea, dementia, hyperlipidemia, history of CVA, who presents emergency department for chief concerns of hyperglycemia.  01/12: admitted for hyperglycemia HHS, also UTI.  01/13: off insulin  gtt and onto sq insulin   01/14 Glc stabilized, still hypernatremia, awaiting labs...      Consultants:  none  Procedures/Surgeries: none      ASSESSMENT & PLAN:   Hyperosmolar hyperglycemic state (HHS) (HCC) Suspect secondary to UTI in setting of patient with severe dementia IV fluids per protocol and accounting for hypernatremia, see below A1C 11.4 likely will need to be back on insulin  long-term on discharge  Basal insulin  Semglee  12 units at bedtime Moderate sliding scale achs   UTI (urinary tract infection) Ecoli pansensitive  Continue ceftriaxone  2 g IV daily to complete 5-day course / transition to appropriate po on discharge   Acute metabolic encephalopathy - improved  Complicated by underlying dementia  Secondary to HHS however on admission patient was not responding and/or moving appropriately on physical exam No concerns on CT head, troponin Monitor    AKI (acute kidney injury) (HCC) 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    Essential hypertension Hydralazine  5 mg IV every 6 hours.  For SBP greater 160, 5 days ordered      DVT prophylaxis: heparin  IV fluids: see above re: IV fluids  Nutrition: puree w/ thickened liquids is baseline diet   Central lines / invasive devices: none  Code Status: DNR ACP documentation reviewed:  none on file in [facility]  TOC needs: TBD Barriers to dispo / significant pending items: Glc stabilisation and improvement in sodium anticiapte possible dc next day or two              Subjective / Brief ROS:  Patient not able to contribute d/t mental/congitive state but she is calm    Family Communication: spoke to daughter Farrel on the phone 10/11/23 5:31 PM     Objective Findings:  Vitals:   10/11/23 1400 10/11/23 1500 10/11/23 1600 10/11/23 1700  BP: 133/73 124/70 (!) 147/81 (!) 155/80  Pulse: 79 84 92 83  Resp: (!) 22 16 19 20   Temp:      TempSrc:      SpO2: 96% 94% 95% 94%    Intake/Output Summary (Last 24 hours) at 10/11/2023 1731 Last data filed at 10/11/2023 1300 Gross per 24 hour  Intake 684.01 ml  Output --  Net 684.01 ml   There were no vitals filed for this visit.  Examination:  Physical Exam Constitutional:      General: She is not in acute distress. Cardiovascular:     Rate and Rhythm: Normal rate and regular rhythm.  Pulmonary:     Effort: Pulmonary effort is normal.     Breath sounds: Normal breath sounds.  Musculoskeletal:     Right lower leg: No edema.     Left lower leg: No edema.  Skin:    General: Skin is warm and dry.  Neurological:     Mental Status: She is alert.  Mental status is at baseline. She is disoriented.  Psychiatric:        Behavior: Behavior normal.          Scheduled Medications:   Chlorhexidine  Gluconate Cloth  6 each Topical Daily   heparin   5,000 Units Subcutaneous Q8H   insulin  aspart  0-15 Units Subcutaneous TID WC   insulin  aspart  0-5 Units Subcutaneous QHS   insulin  glargine-yfgn  12 Units Subcutaneous QHS    Continuous Infusions:  cefTRIAXone  (ROCEPHIN )  IV 2 g (10/11/23 1637)    PRN Medications:  dextrose , docusate sodium , hydrALAZINE , metoprolol  tartrate, polyethylene glycol  Antimicrobials from  admission:  Anti-infectives (From admission, onward)    Start     Dose/Rate Route Frequency Ordered Stop   10/09/23 1500  cefTRIAXone  (ROCEPHIN ) 2 g in sodium chloride  0.9 % 100 mL IVPB        2 g 200 mL/hr over 30 Minutes Intravenous Every 24 hours 10/09/23 1432 10/14/23 1459   10/09/23 1415  cefTRIAXone  (ROCEPHIN ) 2 g in sodium chloride  0.9 % 100 mL IVPB  Status:  Discontinued        2 g 200 mL/hr over 30 Minutes Intravenous  Once 10/09/23 1413 10/09/23 1432           Data Reviewed:  I have personally reviewed the following...  CBC: Recent Labs  Lab 10/09/23 1158 10/11/23 0408  WBC 13.5* 9.4  NEUTROABS 11.3*  --   HGB 12.9 12.7  HCT 41.2 39.6  MCV 95.2 92.7  PLT 239 209   Basic Metabolic Panel: Recent Labs  Lab 10/09/23 1158 10/09/23 1550 10/09/23 2108 10/10/23 0111 10/10/23 0351 10/10/23 0829 10/10/23 1405 10/10/23 1757 10/10/23 1949 10/10/23 2348 10/11/23 0408 10/11/23 0726  NA 153*   < > 160* 160* 161* 160* 153*  --  155* 154* 151* 152*  K 3.8   < > 3.7 3.7 3.5 3.8 3.5 3.5 3.7 3.6 3.5  --   CL 114*   < > 122* 122* 122* 120*  --   --   --   --  114*  --   CO2 27   < > 24 25 28 28   --   --   --   --  24  --   GLUCOSE 589*   < > 135* 141* 151* 174*  --   --   --   --  154*  --   BUN 70*   < > 55* 51* 49* 44*  --   --   --   --  28*  --   CREATININE 2.37*   < > 1.59* 1.37* 1.31* 1.23*  --   --   --   --  1.04*  --   CALCIUM  9.7   < > 9.5 9.4 9.4 9.3  --   --   --   --  9.1  --   MG 2.8*  --   --   --   --   --   --   --   --   --   --   --    < > = values in this interval not displayed.   GFR: CrCl cannot be calculated (Unknown ideal weight.). Liver Function Tests: Recent Labs  Lab 10/09/23 1158  AST 25  ALT 21  ALKPHOS 104  BILITOT 0.7  PROT 8.3*  ALBUMIN 3.9   No results for input(s): LIPASE, AMYLASE in the last 168 hours. No results for input(s): AMMONIA in  the last 168 hours. Coagulation Profile: Recent Labs  Lab 10/09/23 1213   INR 1.2   Cardiac Enzymes: No results for input(s): CKTOTAL, CKMB, CKMBINDEX, TROPONINI in the last 168 hours. BNP (last 3 results) No results for input(s): PROBNP in the last 8760 hours. HbA1C: Recent Labs    10/10/23 0112  HGBA1C 11.4*   CBG: Recent Labs  Lab 10/10/23 1717 10/10/23 2041 10/11/23 0803 10/11/23 1101 10/11/23 1626  GLUCAP 127* 209* 174* 204* 115*   Lipid Profile: No results for input(s): CHOL, HDL, LDLCALC, TRIG, CHOLHDL, LDLDIRECT in the last 72 hours. Thyroid Function Tests: No results for input(s): TSH, T4TOTAL, FREET4, T3FREE, THYROIDAB in the last 72 hours. Anemia Panel: No results for input(s): VITAMINB12, FOLATE, FERRITIN, TIBC, IRON, RETICCTPCT in the last 72 hours. Most Recent Urinalysis On File:     Component Value Date/Time   COLORURINE YELLOW (A) 10/09/2023 1158   APPEARANCEUR CLOUDY (A) 10/09/2023 1158   LABSPEC 1.027 10/09/2023 1158   PHURINE 5.0 10/09/2023 1158   GLUCOSEU >=500 (A) 10/09/2023 1158   HGBUR MODERATE (A) 10/09/2023 1158   BILIRUBINUR NEGATIVE 10/09/2023 1158   KETONESUR NEGATIVE 10/09/2023 1158   PROTEINUR NEGATIVE 10/09/2023 1158   NITRITE POSITIVE (A) 10/09/2023 1158   LEUKOCYTESUR MODERATE (A) 10/09/2023 1158   Sepsis Labs: @LABRCNTIP (procalcitonin:4,lacticidven:4) Microbiology: Recent Results (from the past 240 hours)  Blood Culture (routine x 2)     Status: None (Preliminary result)   Collection Time: 10/09/23 12:13 PM   Specimen: BLOOD  Result Value Ref Range Status   Specimen Description BLOOD RIGHT ANTECUBITAL  Final   Special Requests   Final    BOTTLES DRAWN AEROBIC AND ANAEROBIC Blood Culture adequate volume   Culture   Final    NO GROWTH 2 DAYS Performed at [facility], [address] [place], [place] [address]    Report Status PENDING  Incomplete  Blood Culture (routine x 2)     Status: None (Preliminary result)   Collection Time: 10/09/23  12:28 PM   Specimen: BLOOD  Result Value Ref Range Status   Specimen Description BLOOD RIGHT ANTECUBITAL  Final   Special Requests   Final    BOTTLES DRAWN AEROBIC AND ANAEROBIC Blood Culture results may not be optimal due to an inadequate volume of blood received in culture bottles   Culture   Final    NO GROWTH 2 DAYS Performed at [facility], [address], [place], [place] [address]    Report Status PENDING  Incomplete  Urine Culture     Status: Abnormal   Collection Time: 10/09/23  2:14 PM   Specimen: Urine, Random  Result Value Ref Range Status   Specimen Description   Final    URINE, RANDOM Performed at [facility], [address], [place], [place] [address]    Special Requests   Final    NONE Performed at [facility], [address] [place], [place] [address]    Culture 70,000 COLONIES/mL ESCHERICHIA COLI (A)  Final   Report Status 10/11/2023 FINAL  Final   Organism ID, Bacteria ESCHERICHIA COLI (A)  Final      Susceptibility   Escherichia coli - MIC*    AMPICILLIN  <=2 SENSITIVE Sensitive     CEFAZOLIN <=4 SENSITIVE Sensitive     CEFEPIME  <=0.12 SENSITIVE Sensitive     CEFTRIAXONE  <=0.25 SENSITIVE Sensitive     CIPROFLOXACIN <=0.25 SENSITIVE Sensitive     GENTAMICIN <=1 SENSITIVE Sensitive     IMIPENEM <=  0.25 SENSITIVE Sensitive     NITROFURANTOIN <=16 SENSITIVE Sensitive     TRIMETH/SULFA <=20 SENSITIVE Sensitive     AMPICILLIN /SULBACTAM <=2 SENSITIVE Sensitive     PIP/TAZO <=4 SENSITIVE Sensitive ug/mL    * 70,000 COLONIES/mL ESCHERICHIA COLI  MRSA Next Gen by PCR, Nasal     Status: None   Collection Time: 10/09/23  4:31 PM   Specimen: Nasal Mucosa; Nasal Swab  Result Value Ref Range Status   MRSA by PCR Next Gen NOT DETECTED NOT DETECTED Final    Comment: (NOTE) The GeneXpert MRSA Assay (FDA approved for NASAL specimens only), is one component of a comprehensive MRSA colonization surveillance program. It is not intended to  diagnose MRSA infection nor to guide or monitor treatment for MRSA infections. Test performance is not FDA approved in patients less than 41 years old. Performed at [facility], [address] [place], [place] [address]       Radiology Studies last 3 days: CT HEAD WO CONTRAST ( ) Result Date: 10/09/2023 CLINICAL DATA:  Stroke suspected, altered mental status EXAM: CT HEAD WITHOUT CONTRAST TECHNIQUE: Contiguous axial images were obtained from the base of the skull through the vertex without intravenous contrast. RADIATION DOSE REDUCTION: This exam was performed according to the departmental dose-optimization program which includes automated exposure control, adjustment of the mA and/or kV according to patient size and/or use of iterative reconstruction technique. COMPARISON:  02/03/2023 FINDINGS: Brain: No evidence of acute infarction, hemorrhage, mass, mass effect, or midline shift. No hydrocephalus or extra-axial fluid collection. Redemonstrated encephalomalacia in the right frontal lobe. Periventricular white matter changes, likely the sequela of chronic small vessel ischemic disease. Vascular: No hyperdense vessel. Skull: Negative for fracture or focal lesion. Sinuses/Orbits: No acute finding. Other: The mastoid air cells are well aerated. IMPRESSION: No acute intracranial process. Electronically Signed   By: Donald Campion M.D.   On: 10/09/2023 16:17   DG Chest Portable 1 View Result Date: 10/09/2023 CLINICAL DATA:  Altered mental status. Possible diabetic ketoacidosis. EXAM: PORTABLE CHEST 1 VIEW COMPARISON:  One-view chest x-ray 01/21/2023 FINDINGS: The heart size is exaggerated by low lung volumes. Mild pulmonary vascular congestion is present. No significant edema is present. No effusion or pneumothorax is present. No focal airspace consolidation is present. IMPRESSION: Low lung volumes and mild pulmonary vascular congestion. Electronically Signed   By: Lonni Necessary M.D.    On: 10/09/2023 12:41         Leah Blunt, DO [facility] 10/11/2023, 5:31 PM    Dictation software may have been used to generate the above note. Typos may occur and escape review in typed/dictated notes. Please contact Dr Ritter directly for clarity if needed.  Staff may message me via secure chat in Epic  but this may not receive an immediate response,  please page me for urgent matters!  If 7PM-7AM, please contact night coverage [email or web address]

## 2023-10-11 NOTE — Progress Notes (Signed)
 Report given to 1C RN at this time.

## 2023-10-12 ENCOUNTER — Inpatient Hospital Stay: Payer: 59

## 2023-10-12 DIAGNOSIS — N189 Chronic kidney disease, unspecified: Secondary | ICD-10-CM

## 2023-10-12 DIAGNOSIS — E870 Hyperosmolality and hypernatremia: Secondary | ICD-10-CM | POA: Diagnosis not present

## 2023-10-12 DIAGNOSIS — N3001 Acute cystitis with hematuria: Secondary | ICD-10-CM | POA: Diagnosis not present

## 2023-10-12 DIAGNOSIS — E1100 Type 2 diabetes mellitus with hyperosmolarity without nonketotic hyperglycemic-hyperosmolar coma (NKHHC): Secondary | ICD-10-CM | POA: Diagnosis not present

## 2023-10-12 DIAGNOSIS — N179 Acute kidney failure, unspecified: Secondary | ICD-10-CM

## 2023-10-12 DIAGNOSIS — G9341 Metabolic encephalopathy: Secondary | ICD-10-CM

## 2023-10-12 DIAGNOSIS — G4733 Obstructive sleep apnea (adult) (pediatric): Secondary | ICD-10-CM

## 2023-10-12 DIAGNOSIS — I10 Essential (primary) hypertension: Secondary | ICD-10-CM

## 2023-10-12 DIAGNOSIS — E669 Obesity, unspecified: Secondary | ICD-10-CM

## 2023-10-12 LAB — BASIC METABOLIC PANEL
Anion gap: 8 (ref 5–15)
BUN: 34 mg/dL — ABNORMAL HIGH (ref 8–23)
CO2: 24 mmol/L (ref 22–32)
Calcium: 8.4 mg/dL — ABNORMAL LOW (ref 8.9–10.3)
Chloride: 114 mmol/L — ABNORMAL HIGH (ref 98–111)
Creatinine, Ser: 1.12 mg/dL — ABNORMAL HIGH (ref 0.44–1.00)
GFR, Estimated: 52 mL/min — ABNORMAL LOW (ref >60)
Glucose, Bld: 164 mg/dL — ABNORMAL HIGH (ref 70–99)
Potassium: 3.7 mmol/L (ref 3.5–5.1)
Sodium: 146 mmol/L — ABNORMAL HIGH (ref 135–145)

## 2023-10-12 LAB — GLUCOSE, CAPILLARY
Glucose-Capillary: 163 mg/dL — ABNORMAL HIGH (ref 70–99)
Glucose-Capillary: 299 mg/dL — ABNORMAL HIGH (ref 70–99)
Glucose-Capillary: 159 mg/dL — ABNORMAL HIGH (ref 70–99)
Glucose-Capillary: 114 mg/dL — ABNORMAL HIGH (ref 70–99)

## 2023-10-12 MED ORDER — QUETIAPINE FUMARATE 25 MG PO TABS
12.5000 mg | ORAL_TABLET | Freq: Every day | ORAL | Status: DC
  Administered 2023-10-12 – 2023-10-17 (×6): 12.5 mg via ORAL
  Filled 2023-10-12 (×6): qty 1

## 2023-10-12 MED ORDER — MIRTAZAPINE 15 MG PO TABS
15.0000 mg | ORAL_TABLET | Freq: Every day | ORAL | Status: DC
  Administered 2023-10-12 – 2023-10-17 (×6): 15 mg via ORAL
  Filled 2023-10-12 (×6): qty 1

## 2023-10-12 NOTE — Assessment & Plan Note (Addendum)
 Class I with BMI 30.02

## 2023-10-12 NOTE — Evaluation (Signed)
 Occupational Therapy Evaluation Patient Details Name: Leah Ritter MRN: [id number] DOB: [birth year] Today's Date: 10/12/2023   History of Present Illness 73 y/o female presented to ED on 10/09/23 for hyperglycemia. Admitted for hyperglycemic state 2/2 UTI. PMH: T2DM, HTN, dementia, hx of CVA, OSA, insomnia   Clinical Impression   Pt admitted with above diagnosis from Memory Care at [facility]. Pt with hx of cognitive deficits, poor historian and no family present. Pt requires assist with all ADLs at facility and utilizes a rollator for mobility. Presents with generalized weakness, impaired balance, tolerance to activity and poor cognition impacting ADL performance. Likely maxA for bed level ADLs, pt has staff to assist with feeding. ModA +2 to stand and perform lateral side steps. Anticipate level of assist may fluctuate based on time of day with pt's level of cognitive deficits. Pt would benefit from skilled OT services to address noted impairments and functional limitations (see below for any additional details) in order to maximize safety and independence while minimizing falls risk and caregiver burden. Pt would benefit from OT services at next LOC.       If plan is discharge home, recommend the following: Two people to help with walking and/or transfers;Two people to help with bathing/dressing/bathroom;Assistance with cooking/housework;Assistance with feeding;Direct supervision/assist for medications management;Direct supervision/assist for financial management;Assist for transportation;Supervision due to cognitive status    Functional Status Assessment  Patient has had a recent decline in their functional status and demonstrates the ability to make significant improvements in function in a reasonable and predictable amount of time.  Equipment Recommendations  None recommended by OT       Precautions / Restrictions Precautions Precautions: Fall Precaution Comments:  dementia Restrictions Weight Bearing Restrictions Per Provider Order: No      Mobility Bed Mobility Overal bed mobility: Needs Assistance Bed Mobility: Supine to Sit, Sit to Supine     Supine to sit: Min assist, Mod assist Sit to supine: Min assist, Mod assist   General bed mobility comments: sits EOB with max multmodal cuing due to dementia    Transfers Overall transfer level: Needs assistance Equipment used: 2 person hand held assist Transfers: Sit to/from Stand Sit to Stand: Mod assist, +2 physical assistance, +2 safety/equipment                  Balance Overall balance assessment: Needs assistance Sitting-balance support: No upper extremity supported, Feet supported Sitting balance-Leahy Scale: Good     Standing balance support: Bilateral upper extremity supported, During functional activity Standing balance-Leahy Scale: Poor                             ADL either performed or assessed with clinical judgement   ADL Overall ADL's : Needs assistance/impaired Eating/Feeding: Maximal assistance;Bed level (pt has staff assisting)   Grooming: Maximal assistance   Upper Body Bathing: Maximal assistance   Lower Body Bathing: Maximal assistance   Upper Body Dressing : Maximal assistance   Lower Body Dressing: Maximal assistance   Toilet Transfer: +2 for physical assistance;BSC/3in1;Cueing for safety;Cueing for sequencing Toilet Transfer Details (indicate cue type and reason): ability to transfer safely will likely require +2, and depend on timing due to pt's cognition.           General ADL Comments: Pt likely at or near baseline for ADL performance. Pt requiring staff assistance for self-feeding, requires maxA for pericare in standing, and follows commands inconsistently. Staff noted that pt changes  level of alertness depending on time of day, so level of assist may flucuate as well.      Pertinent Vitals/Pain Pain Assessment Pain  Assessment: Faces Pain Score: 0-No pain Pain Intervention(s): Monitored during session     Extremity/Trunk Assessment Upper Extremity Assessment Upper Extremity Assessment: Generalized weakness;Difficult to assess due to impaired cognition   Lower Extremity Assessment Lower Extremity Assessment: Generalized weakness   Cervical / Trunk Assessment Cervical / Trunk Assessment: Kyphotic   Communication Communication Communication: Difficulty following commands/understanding Following commands: Follows one step commands consistently Cueing Techniques: Verbal cues;Tactile cues;Gestural cues   Cognition Arousal: Alert Behavior During Therapy: Flat affect Overall Cognitive Status: History of cognitive impairments - at baseline                                 General Comments: pleasantly confused throughout. Perseverates on same sentence. Follows commands inconsistently with increased time and cueing                Home Living Family/patient expects to be discharged to:: Other (Comment)                                 Additional Comments: From [facility] Care at [facility]      Prior Functioning/Environment Prior Level of Function : Needs assist             Mobility Comments: Per dtr from last admission in April 2024, pt typically requires some assist for transfers and bed mobility and then is up with a rollator, patient modified independent ambulating with rollator. Hx of falls ADLs Comments: Per previous documentation (6 months prior) and dtr, pt wears depends; assist for bathing, dressing, medications, meals, and cleaning. Per daughter, often refuses oral care. Dtr notes pt lost her dentures and partial last year.        OT Problem List: Decreased strength;Decreased activity tolerance;Impaired vision/perception;Decreased cognition;Decreased safety awareness;Obesity;Decreased knowledge of use of DME or AE      OT Treatment/Interventions:  Self-care/ADL training;Therapeutic exercise;DME and/or AE instruction;Therapeutic activities;Patient/family education;Balance training;Cognitive remediation/compensation    OT Goals(Current goals can be found in the care plan section) Acute Rehab OT Goals OT Goal Formulation: Patient unable to participate in goal setting Time For Goal Achievement: 10/26/23 Potential to Achieve Goals: Fair  OT Frequency: Min 1X/week    Co-evaluation PT/OT/SLP Co-Evaluation/Treatment: Yes Reason for Co-Treatment: For patient/therapist safety;To address functional/ADL transfers;Necessary to address cognition/behavior during functional activity PT goals addressed during session: Balance;Mobility/safety with mobility OT goals addressed during session: ADL's and self-care;Strengthening/ROM      AM-PAC OT "6 Clicks" Daily Activity     Outcome Measure Help from another person eating meals?: A Little Help from another person taking care of personal grooming?: A Lot Help from another person toileting, which includes using toliet, bedpan, or urinal?: Total Help from another person bathing (including washing, rinsing, drying)?: Total Help from another person to put on and taking off regular upper body clothing?: Total Help from another person to put on and taking off regular lower body clothing?: Total 6 Click Score: 9   End of Session Nurse Communication: Mobility status  Activity Tolerance: Patient tolerated treatment well Patient left: in bed;with call bell/phone within reach;with nursing/sitter in room;with bed alarm set  OT Visit Diagnosis: Other symptoms and signs involving cognitive function;Muscle weakness (generalized) (M62.81)  Time: 5638-7564 OT Time Calculation (min): 15 min Charges:  OT General Charges $OT Visit: 1 Visit OT Evaluation $OT Eval Low Complexity: 1 Low  Alissandra Geoffroy L. Christiaan Strebeck, OTR/L  10/12/23, 2:40 PM

## 2023-10-12 NOTE — Evaluation (Signed)
 Clinical/Bedside Swallow Evaluation Patient Details  Name: Leah Ritter MRN: [id number] Date of Birth: 31-Jul-1951  Today's Date: 10/12/2023 Time: SLP Start Time (ACUTE ONLY): 0830 SLP Stop Time (ACUTE ONLY): 0930 SLP Time Calculation (min) (ACUTE ONLY): 60 min  Past Medical History:  Past Medical History:  Diagnosis Date   A-fib (HCC)    Anxiety    CHF (congestive heart failure) (HCC)    Diabetes (HCC)    GERD (gastroesophageal reflux disease)    Hypertension    Sleep apnea    Stroke [facility])    Past Surgical History:  Past Surgical History:  Procedure Laterality Date   PARTIAL HYSTERECTOMY     tubligation     HPI:  Pt is a 73 y/o female presented to ED on 10/09/23 for hyperglycemia. Admitted for hyperglycemic state 2/2 UTI. PMH: Dementia, dysphagia, UTIs, T2DM, HTN, hx of CVA, OSA, insomnia.  SHe resides at a Memory Care facility and requires full support w/ ADLs.   Head CTs: no acute findings.   CXR: Low lung volumes and mild pulmonary vascular congestion.    Assessment / Plan / Recommendation  Clinical Impression   Pt seen for BSE this AM. Pt nonverbal, eyes closed and did not respond verbally to direct question/stim nor follow any commands. When tactile stim given(spoon at lower lip), she opened her mouth spontaneously to take the boluses fed to her. Increased oral phase time/gumming noted. Pt resides at a Memory Care facility per chart.  NSG present checking blood sugar level - WFL per NSG.  On RA, afebrile. WBC WNL.  Pt appears to present w/ oropharyngeal phase dysphagia in setting of acute illness, need for MAX support w/ feeding, and declined Cognitive status; Baseline Dementia. ANY Cognitive decline can impact her overall awareness/timing of swallow and safety during po tasks which increases risk for aspiration, choking. Pt's risk for aspiration/aspiration pneumonia is increased at this time. Such can be reduced when following general aspiration precautions and using a  modified diet consistency(Dysphagia diet) as already in place in chart per MD.         Pt consumed several trials of purees and Nectar consistency liquids via TSP w/ No overt clinical s/s of aspiration noted: no cough and no decline in respiratory status during/post trials. O2 sats 98% when checked. Pharyngeal swallowing appeared complete w/ WFL laryngeal excursion. Oral phase was c/b POOR awareness and increased oral phase time for gumming on boluses/bolus residue afterwards(?). Pt did NOT immediately open mouth (nor eyes) during tasks to appropriately engage in them. However, shen tactile stim was given(spoon at lower lip), she opened her mouth spontaneously to take the boluses fed to her. Increased oral phase time/gumming noted. Time and moistening the foods given. She required MAX cues/stim w/ po trials, feeding. OM Exam was cursory(she did not follow any commands) but appeared [facility] w/ No unilateral weakness noted during bolus management. Some confusion of oral care noted(chewing on swabs).   In setting of Baseline Dementia and current Cognitive presentation, Edentulous status, and her risk for aspiration/choking, recommend continue the current dysphagia level 1(PUREE foods) moistened for ease of oral phase w/ Nectar liquids. General aspiration precautions; reduce Distractions during meals and engage pt during meals for self-feeding. 100% Supervision and support w/ feeding -- monitor pt's engagement and responses giving po's only when engaged in task. Pills Crushed in Puree for safer swallowing as needed.  ST services will f/u during admit w/ trials to upgrade diet as appropriate, safe. MD/NSG updated.  ST services recommends  follow w/ Palliative Care for GOC and education re: impact of Cognitive decline/Dementia on swallowing. Suspect pt's Cognitive decline could impact ability to upgrade diet consistency safely. Recommend Dietician f/u. Precautions posted in room. SLP Visit Diagnosis: Dysphagia,  oropharyngeal phase (R13.12) (Dementia baseline)    Aspiration Risk  Moderate aspiration risk;Risk for inadequate nutrition/hydration    Diet Recommendation   Nectar;Dysphagia 1 (puree) = dysphagia level 1(PUREE foods) moistened for ease of oral phase w/ Nectar liquids. General aspiration precautions; reduce Distractions during meals and engage pt during meals for self-feeding. 100% Supervision and support w/ feeding -- monitor pt's engagement and responses giving po's only when engaged in task.   Medication Administration: Crushed with puree    Other  Recommendations Recommended Consults:  (Palliative Care; Dietician) Oral Care Recommendations: Oral care BID;Oral care before and after PO;Staff/trained caregiver to provide oral care Caregiver Recommendations: Avoid jello, ice cream, thin soups, popsicles;Remove water pitcher;Have oral suction available    Recommendations for follow up therapy are one component of a multi-disciplinary discharge planning process, led by the attending physician.  Recommendations may be updated based on patient status, additional functional criteria and insurance authorization.  Follow up Recommendations Follow physician's recommendations for discharge plan and follow up therapies (at next venue of care)      Assistance Recommended at Discharge  FULL  Functional Status Assessment Patient has had a recent decline in their functional status and/or demonstrates limited ability to make significant improvements in function in a reasonable and predictable amount of time  Frequency and Duration min 2x/week  2 weeks       Prognosis Prognosis for improved oropharyngeal function: Fair Barriers to Reach Goals: Cognitive deficits;Language deficits;Time post onset;Severity of deficits;Behavior Barriers/Prognosis Comment: declined Cognitive presentation/Dementia; poor awareness      Swallow Study   General Date of Onset: 10/09/23 HPI: Pt is a 73 y/o female presented  to ED on 10/09/23 for hyperglycemia. Admitted for hyperglycemic state 2/2 UTI. PMH: Dementia, dysphagia, UTIs, T2DM, HTN, hx of CVA, OSA, insomnia.  SHe resides at a Memory Care facility and requires full support w/ ADLs.   Head CTs: no acute findings.   CXR: Low lung volumes and mild pulmonary vascular congestion. Type of Study: Bedside Swallow Evaluation Previous Swallow Assessment: 2023; 2022 - mech soft diet w/ thins then Diet Prior to this Study: Dysphagia 1 (pureed);Mildly thick liquids (Level 2, nectar thick) Temperature Spikes Noted: No (wbc 9.4) Respiratory Status: Room air History of Recent Intubation: No Behavior/Cognition: Confused;Lethargic/Drowsy;Requires cueing;Doesn't follow directions (responded to tactile stim) Oral Cavity Assessment: Dry Oral Care Completed by SLP: Yes (pt chewed on the swab) Oral Cavity - Dentition:  (no upper dentition; no molars) Vision:  (n/a) Self-Feeding Abilities: Total assist Patient Positioning: Upright in bed (max assist) Baseline Vocal Quality:  (nonverbal) Volitional Cough: Cognitively unable to elicit Volitional Swallow: Unable to elicit    Oral/Motor/Sensory Function Overall Oral Motor/Sensory Function:  (WFL w/ bolus management -- unable to assess formally d/t Cognitive decline)   Ice Chips Ice chips: Not tested   Thin Liquid Thin Liquid: Not tested    Nectar Thick Nectar Thick Liquid: Impaired (oral prep/awareness deficits) Presentation: Spoon (12+ trials) Oral Phase Impairments: Poor awareness of bolus Oral phase functional implications:  (increased time; gumming) Pharyngeal Phase Impairments:  (no overt)   Honey Thick Honey Thick Liquid: Not tested   Puree Puree: Impaired (oral prep/awareness deficits) Presentation: Spoon (fed; 15+ trials) Oral Phase Impairments: Poor awareness of bolus Oral Phase Functional Implications:  (increased  time; gumming) Pharyngeal Phase Impairments:  (no overt)   Solid     Solid: Not tested         Darla Edward, MS, CCC-SLP Speech Language Pathologist Rehab Services; [facility] [phone] (ascom) Jetta Murray 10/12/2023,3:50 PM

## 2023-10-12 NOTE — Care Management Important Message (Signed)
 Important Message  Patient Details  Name: Leah Ritter MRN: [id number] Date of Birth: 1951-06-30   Important Message Given:  Yes - Medicare IM     Brookie Cantor, CMA 10/12/2023, 11:52 AM

## 2023-10-12 NOTE — Inpatient Diabetes Management (Signed)
 Inpatient Diabetes Program Recommendations  AACE/ADA: New Consensus Statement on Inpatient Glycemic Control (2015)  Target Ranges:  Prepandial:   less than 140 mg/dL      Peak postprandial:   less than 180 mg/dL (1-2 hours)      Critically ill patients:  140 - 180 mg/dL    Latest Reference Range & Units 10/11/23 08:03 10/11/23 11:01 10/11/23 16:26 10/11/23 20:59  Glucose-Capillary 70 - 99 mg/dL 409 (H)  3 units Novolog   204 (H)  5 units Novolog   115 (H) 350 (H)  4 units Novolog  @2307   12 units Semglee  @2307   (H): Data is abnormally high  Latest Reference Range & Units 10/12/23 09:04  Glucose-Capillary 70 - 99 mg/dL 811 (H)  (H): Data is abnormally high   Admit from [facility] memory unit via EMS for Hyperglycemia Hyperosmolar hyperglycemic state  UTI   History: DM2, Dementia with delusion misidentification disorder, CVA    Home DM Meds: None    Current Orders: Semglee  12 units at bedtime      Novolog  Moderate Correction Scale/ SSI (0-15 units) TID AC + HS     May need Insulin  at time of discharge given A1c is currently 11.4 and pt admitted with glucose of 589, however, after discussion with pt's Daughter at bedside (01/13), daughter told me when pt's mental status returns to baseline that pt is often easily agitated when people try to bathe her or give her meds.  Staff at the memory care unit often have to put meds in applesauce or pudding to get pt to take her meds.  Dtr told me pt often refuses fingerstick CBGs.  Dtr told me pt was receiving Hospice care at the memory care facility.  It may be easiest to prescribe Basal Insulin  Once daily + Oral DM Medication for OP therapy--Perhaps DPP-4 inhibitor like Tradjenta 5 mg daily?   --Will follow patient during hospitalization--  Langston Pippins RN, MSN, CDCES Diabetes Coordinator Inpatient Glycemic Control Team Team Pager: [phone] (8a-5p)

## 2023-10-12 NOTE — Plan of Care (Signed)
 GOC consult noted. Notes reviewed. Pper notes, patient talks and eats at baseline. She is currently sleeping. PMT will reattempt speaking with patient tomorrow.

## 2023-10-12 NOTE — Evaluation (Signed)
 Physical Therapy Evaluation Patient Details Name: Leah Ritter MRN: [id number] DOB: 10-10-1950 Today's Date: 10/12/2023  History of Present Illness  73 y/o female presented to ED on 10/09/23 for hyperglycemia. Admitted for hyperglycemic state 2/2 UTI. PMH: T2DM, HTN, dementia, hx of CVA, OSA, insomnia  Clinical Impression  Patient admitted with the above. PTA, patient lives at [facility] unit. From previous admission 12/2022, patient required assistance for mobility and utilizes rollator. Patient presents with weakness, impaired balance, decreased activity tolerance, and impaired cognition. Required min-modA for bed mobility and modA+2 for sit to stand and sidesteps towards [facility] with HHAx2. Oriented to self throughout session. No family present during evaluation to provide PLOF or cognitive baseline. Patient will benefit from skilled PT services during acute stay to address listed deficits. Patient will benefit from ongoing therapy at discharge to maximize functional independence and safety.         If plan is discharge home, recommend the following: A lot of help with walking and/or transfers;A lot of help with bathing/dressing/bathroom;Assistance with cooking/housework;Assistance with feeding;Direct supervision/assist for medications management;Assist for transportation;Direct supervision/assist for financial management;Supervision due to cognitive status   Can travel by private vehicle        Equipment Recommendations None recommended by PT  Recommendations for Other Services       Functional Status Assessment Patient has had a recent decline in their functional status and demonstrates the ability to make significant improvements in function in a reasonable and predictable amount of time.     Precautions / Restrictions Precautions Precautions: Fall Restrictions Weight Bearing Restrictions Per Provider Order: No      Mobility  Bed Mobility Overal bed mobility: Needs  Assistance Bed Mobility: Supine to Sit, Sit to Supine     Supine to sit: Min assist, Mod assist Sit to supine: Min assist, Mod assist        Transfers Overall transfer level: Needs assistance Equipment used: 2 person hand held assist Transfers: Sit to/from Stand Sit to Stand: Mod assist, +2 physical assistance, +2 safety/equipment                Ambulation/Gait Ambulation/Gait assistance: Mod assist, +2 safety/equipment, +2 physical assistance Gait Distance (Feet): 3 Feet Assistive device: 2 person hand held assist Gait Pattern/deviations: Step-to pattern       General Gait Details: sidesteps towards HOB with modA for weight shifting and guiding to [facility] with HHAx2  Stairs            Wheelchair Mobility     Tilt Bed    Modified Rankin (Stroke Patients Only)       Balance Overall balance assessment: Needs assistance Sitting-balance support: No upper extremity supported, Feet supported Sitting balance-Leahy Scale: Good     Standing balance support: Bilateral upper extremity supported, During functional activity Standing balance-Leahy Scale: Poor                               Pertinent Vitals/Pain Pain Assessment Pain Assessment: Faces Faces Pain Scale: No hurt Pain Intervention(s): Monitored during session    Home Living Family/patient expects to be discharged to:: Other (Comment)                   Additional Comments: From Memory Care at [facility]    Prior Function Prior Level of Function : Needs assist             Mobility Comments: Per dtr  from last admission in April 2024, pt typically requires some assist for transfers and bed mobility and then is up with a rollator, patient modified independent ambulating with rollator. Hx of falls       Extremity/Trunk Assessment   Upper Extremity Assessment Upper Extremity Assessment: Defer to OT evaluation    Lower Extremity Assessment Lower Extremity Assessment:  Generalized weakness    Cervical / Trunk Assessment Cervical / Trunk Assessment: Kyphotic  Communication   Communication Communication: Difficulty following commands/understanding Following commands: Follows one step commands inconsistently Cueing Techniques: Verbal cues;Gestural cues;Tactile cues  Cognition Arousal: Alert Behavior During Therapy: Flat affect Overall Cognitive Status: History of cognitive impairments - at baseline                                 General Comments: pleasantly confused throughout. Follows commands inconsistently with increased time and cueing        General Comments      Exercises     Assessment/Plan    PT Assessment Patient needs continued PT services  PT Problem List Decreased strength;Decreased activity tolerance;Decreased balance;Decreased mobility;Decreased cognition;Decreased knowledge of use of DME;Decreased safety awareness;Decreased knowledge of precautions;Cardiopulmonary status limiting activity       PT Treatment Interventions DME instruction;Gait training;Functional mobility training;Therapeutic activities;Therapeutic exercise;Balance training;Patient/family education    PT Goals (Current goals can be found in the Care Plan section)  Acute Rehab PT Goals Patient Stated Goal: did not state PT Goal Formulation: Patient unable to participate in goal setting Time For Goal Achievement: 10/26/23 Potential to Achieve Goals: Fair    Frequency Min 1X/week     Co-evaluation PT/OT/SLP Co-Evaluation/Treatment: Yes Reason for Co-Treatment: For patient/therapist safety;To address functional/ADL transfers;Necessary to address cognition/behavior during functional activity PT goals addressed during session: Balance;Mobility/safety with mobility         AM-PAC PT "6 Clicks" Mobility  Outcome Measure Help needed turning from your back to your side while in a flat bed without using bedrails?: A Lot Help needed moving from  lying on your back to sitting on the side of a flat bed without using bedrails?: A Lot Help needed moving to and from a bed to a chair (including a wheelchair)?: Total Help needed standing up from a chair using your arms (e.g., wheelchair or bedside chair)?: Total Help needed to walk in hospital room?: Total Help needed climbing 3-5 steps with a railing? : Total 6 Click Score: 8    End of Session   Activity Tolerance: Patient tolerated treatment well Patient left: in bed;with call bell/phone within reach;with bed alarm set;with nursing/sitter in room Nurse Communication: Mobility status PT Visit Diagnosis: Unsteadiness on feet (R26.81);Muscle weakness (generalized) (M62.81)    Time: 4098-1191 PT Time Calculation (min) (ACUTE ONLY): 15 min   Charges:   PT Evaluation $PT Eval Moderate Complexity: 1 Mod   PT General Charges $$ ACUTE PT VISIT: 1 Visit         Janine Melbourne, PT, DPT Physical Therapist - [facility]  [facility]   Jeraldean Wechter A Koreen Lizaola 10/12/2023, 12:51 PM

## 2023-10-12 NOTE — Assessment & Plan Note (Addendum)
E. coli growing out of urine culture and patient has completed 5 days of Rocephin.

## 2023-10-12 NOTE — TOC Progression Note (Signed)
 Transition of Care [facility]) - Progression Note    Patient Details  Name: Leah Ritter MRN: [id number] Date of Birth: May 04, 1951  [facility] CM/SW Contact  Aleene Amor, LCSW Phone Number: 10/12/2023, 1:00 PM  Clinical Narrative:   Per MD pt stopped community hospice on 1/10. Palliative to consult. CSW following.         Expected Discharge Plan and Services                                               Social Determinants of Health (SDOH) Interventions SDOH Screenings   Food Insecurity: Patient Unable To Answer (10/09/2023)  Housing: Patient Unable To Answer (10/09/2023)  Transportation Needs: Patient Unable To Answer (10/09/2023)  Utilities: Patient Unable To Answer (10/09/2023)  Alcohol Screen: Low Risk  (09/05/2018)  Social Connections: Patient Unable To Answer (10/09/2023)  Tobacco Use: Low Risk  (10/09/2023)    Readmission Risk Interventions     No data to display

## 2023-10-12 NOTE — Plan of Care (Signed)

## 2023-10-12 NOTE — Progress Notes (Signed)
 Attempted to call Chrystal Crape, listed as LG in chart, to complete admission profile. No response to phone call. Unable to complete admission profile at this time

## 2023-10-12 NOTE — Assessment & Plan Note (Addendum)
AKI on CKD stage IIIa.  Creatinine 2.37 on presentation and down to 1.09.

## 2023-10-12 NOTE — Progress Notes (Signed)
 Progress Note   Patient: Leah Ritter AVW:[id number] DOB: 10-Nov-1950 DOA: 10/09/2023     3 DOS: the patient was seen and examined on 10/12/2023   Brief hospital course: EMS responded and the blood glucose check was 565.  Vitals in the ED showed temperature of 97.8, respiration rate of 8, heart rate of 98, blood pressure 120/107, SpO2 of 94% on room air.  Serum sodium is 153, potassium 2.8, chloride 114, bicarb 27, BUN of 70, serum creatinine of 2.37, EGFR of 21, nonfasting blood glucose 589, WBC 13.5, hemoglobin 12.9, platelet 239.  Anion gap was not elevated at 12.  Magnesium  elevated at 2.8.  Lactic acid 1.7.  Serum osmolality was elevated at 382.  Procalcitonin was less than 0.10.  Beta hydroxybutyrate was minimally elevated at 0.52.  UA was positive for moderate leukocytes and nitrates.  Blood cultures x 2 are in process.  ED treatment: Ceftriaxone  2 g IV one-time dose, insulin  per Endo tool, LR 2 L bolus, potassium chloride  10 mill: X   73 year old female with history of prior insulin -dependent diabetes mellitus now non-insulin -dependent diabetes mellitus, hypertension, insomnia, anxiety, obstructive sleep apnea, dementia, hyperlipidemia, history of CVA, who presents emergency department for chief concerns of hyperglycemia.  01/12: admitted for hyperglycemia HHS, also UTI.  01/13: off insulin  gtt and onto sq insulin   1/15.  Mental status still impaired.  Kept her eyes closed the entire time.  CT scan of the head negative.  Spoke with patient's POA and stated her baseline mental status as she can get fussy but usually speaks.                Assessment and Plan: * Hyperosmolar hyperglycemic state (HHS) (HCC) Initially was on insulin  drip but now on Semglee  insulin  12 units at bedtime with sliding scale.  Hypernatremia Today sodium 146 on half-normal saline.  Peak sodium 161.  Usually this is a poor prognostic factor.  Likely will be unable to keep up with nutritional  needs.  Palliative care consultation.  Patient's POA states hospice has signed off on her case.  Acute cystitis with hematuria E. coli growing out of urine culture on Rocephin .  Acute metabolic encephalopathy Mental status still impaired.  Patient does have underlying dementia.  Likely worsened with hyperosmolar hyperglycemic state, hypernatremia and UTI.  Acute kidney injury superimposed on CKD (HCC) AKI on CKD stage IIIa.  Creatinine 2.37 on presentation and down to 1.12.  Sleep apnea Will try CPAP at night.  Essential hypertension Hydralazine  5 mg IV every 6 hours.  For SBP greater 160, 5 days ordered  Obesity (BMI 30-39.9) Class I with BMI 30.02        Subjective: Patient kept her eyes closed the entire time I was talking with her.  She did not speak with me.  As per POA she normally is able to talk and eat.  POA states that over the past few weeks she has been less talkative and sleeping more.  Physical Exam: Vitals:   10/11/23 2053 10/11/23 2346 10/12/23 0458 10/12/23 0911  BP: 111/64 124/69 95/68 (!) 135/53  Pulse: 99 90 91 75  Resp: 16 18 18    Temp: 98.5 F (36.9 C) 98.5 F (36.9 C) 98.4 F (36.9 C) 98.1 F (36.7 C)  TempSrc: Oral  Oral Axillary  SpO2: 96% 98% 98% 100%  Weight: 92.2 kg      Physical Exam HENT:     Head: Normocephalic.     Mouth/Throat:     Pharynx: No oropharyngeal  exudate.  Eyes:     General: Lids are normal.     Conjunctiva/sclera: Conjunctivae normal.  Cardiovascular:     Rate and Rhythm: Normal rate and regular rhythm.     Heart sounds: Normal heart sounds, S1 normal and S2 normal.  Pulmonary:     Breath sounds: No decreased breath sounds, wheezing, rhonchi or rales.  Abdominal:     Palpations: Abdomen is soft.     Tenderness: There is no abdominal tenderness.  Musculoskeletal:     Right lower leg: Swelling present.     Left lower leg: Swelling present.  Skin:    General: Skin is warm.     Findings: No rash.  Neurological:      Mental Status: She is lethargic.     Data Reviewed: Sodium 146, creatinine 1.12, GFR 52 Repeat CT scan of the head negative  Family Communication: Spoke with POA on the phone  Disposition: Status is: Inpatient Remains inpatient appropriate because: Mental status still impaired from baseline  Planned Discharge Destination: Back to memory care    Time spent: 28 minutes  Author: Verla Glaze, MD 10/12/2023 12:51 PM  For on call review [email or web address].

## 2023-10-13 DIAGNOSIS — Z7189 Other specified counseling: Secondary | ICD-10-CM | POA: Diagnosis not present

## 2023-10-13 DIAGNOSIS — G9341 Metabolic encephalopathy: Secondary | ICD-10-CM | POA: Diagnosis not present

## 2023-10-13 DIAGNOSIS — K219 Gastro-esophageal reflux disease without esophagitis: Secondary | ICD-10-CM

## 2023-10-13 DIAGNOSIS — G4739 Other sleep apnea: Secondary | ICD-10-CM

## 2023-10-13 DIAGNOSIS — N3001 Acute cystitis with hematuria: Secondary | ICD-10-CM | POA: Diagnosis not present

## 2023-10-13 DIAGNOSIS — E870 Hyperosmolality and hypernatremia: Secondary | ICD-10-CM | POA: Diagnosis not present

## 2023-10-13 DIAGNOSIS — E1100 Type 2 diabetes mellitus with hyperosmolarity without nonketotic hyperglycemic-hyperosmolar coma (NKHHC): Secondary | ICD-10-CM | POA: Diagnosis not present

## 2023-10-13 LAB — BASIC METABOLIC PANEL
Anion gap: 10 (ref 5–15)
BUN: 30 mg/dL — ABNORMAL HIGH (ref 8–23)
CO2: 23 mmol/L (ref 22–32)
Calcium: 8.8 mg/dL — ABNORMAL LOW (ref 8.9–10.3)
Chloride: 110 mmol/L (ref 98–111)
Creatinine, Ser: 1.13 mg/dL — ABNORMAL HIGH (ref 0.44–1.00)
GFR, Estimated: 51 mL/min — ABNORMAL LOW (ref >60)
Glucose, Bld: 246 mg/dL — ABNORMAL HIGH (ref 70–99)
Potassium: 4.2 mmol/L (ref 3.5–5.1)
Sodium: 143 mmol/L (ref 135–145)

## 2023-10-13 LAB — GLUCOSE, CAPILLARY
Glucose-Capillary: 102 mg/dL — ABNORMAL HIGH (ref 70–99)
Glucose-Capillary: 143 mg/dL — ABNORMAL HIGH (ref 70–99)
Glucose-Capillary: 273 mg/dL — ABNORMAL HIGH (ref 70–99)
Glucose-Capillary: 130 mg/dL — ABNORMAL HIGH (ref 70–99)

## 2023-10-13 MED ORDER — ORAL CARE MOUTH RINSE: 15.0000 mL | OROMUCOSAL | Status: DC | PRN

## 2023-10-13 MED ORDER — ORAL CARE MOUTH RINSE
15.0000 mL | OROMUCOSAL | Status: DC
  Administered 2023-10-13 – 2023-10-18 (×18): 15 mL via OROMUCOSAL

## 2023-10-13 NOTE — Progress Notes (Signed)
Speech Language Pathology Treatment: Dysphagia  Patient Details Name: Leah Ritter MRN: [id number] DOB: 03-Apr-1951 Today's Date: 10/13/2023 Time: 1320-1400 SLP Time Calculation (min) (ACUTE ONLY): 40 min  Assessment / Plan / Recommendation Clinical Impression  Pt seen for ongoing assessment of pt's swallowing and toleration of diet today. Pt mostly nonverbal, eyes closed and did not respond verbally to direct question/stim nor follow any commands. She was awake to mumbled "thank you, thank you" when moving her up in the bed. When tactile stim given(spoon at lower lip), she opened her mouth spontaneously to take the boluses fed to her. Increased oral phase time/gumming noted. Pt resides at a Memory [facility] facility per chart.   On RA, afebrile. WBC WNL.   Pt appears to present w/ ongoing oropharyngeal phase dysphagia in setting of acute illness, need for MAX support w/ feeding and tactile cues at mouth Cues to take po's, and declined Cognitive status; Baseline Dementia. ANY Cognitive decline can impact her overall awareness/timing of swallow and safety during po tasks which increases risk for aspiration, choking. Pt's risk for aspiration/aspiration pneumonia is increased at this time. Such can be reduced when following general aspiration precautions and using a modified diet consistency(Dysphagia diet) as already in place in chart per MD.         Pt consumed several trials of purees and Nectar consistency liquids via Straw w/ No overt clinical s/s of aspiration noted: no cough and no decline in respiratory status during/post trials. Pharyngeal swallowing appeared complete w/ WFL laryngeal excursion. Oral phase was c/b POOR oral prep awareness and increased oral phase time for gumming on boluses/bolus residue afterwards(Dementia?). Pt did NOT immediately open mouth (nor eyes) during tasks to appropriately engage in them. However, when tactile stim was given(straw, spoon at lower lip), she opened her mouth  spontaneously to take the boluses fed to her. Increased oral phase time/gumming noted. Time and moistening the foods given. She required MAX cues/stim w/ po trials, feeding.    In setting of Baseline Dementia and current Cognitive presentation, Edentulous status, and her risk for aspiration/choking, recommend continue the current dysphagia level 1(PUREE foods) moistened for ease of oral phase w/ Nectar liquids. General aspiration precautions; reduce Distractions during meals and engage pt during meals for self-feeding. 100% Supervision and support w/ feeding -- monitor pt's engagement and responses giving po's only when engaged in task. Pills Crushed in Puree for safer swallowing as needed.   ST services recommends ongoing followup w/ Palliative Care for GOC and education re: impact of Cognitive decline/Dementia on swallowing, eating/drinking. Suspect pt's Cognitive decline could impact ability to upgrade diet consistency safely. Pt could have f/u at her next venue of care if her presentation indicates appropriateness for diet upgrade then. Recommend Dietician f/u. Precautions posted in room. MD/NSG updated, agreed.     HPI HPI: Pt is a 73 y/o female presented to ED on 10/09/23 for hyperglycemia. Admitted for hyperglycemic state 2/2 UTI. PMH: Dementia, dysphagia, UTIs, T2DM, HTN, hx of CVA, OSA, insomnia.  SHe resides at a Memory Care facility and requires full support w/ ADLs.   Head CTs: no acute findings.   CXR: Low lung volumes and mild pulmonary vascular congestion.      SLP Plan  All goals met      Recommendations for follow up therapy are one component of a multi-disciplinary discharge planning process, led by the attending physician.  Recommendations may be updated based on patient status, additional functional criteria and insurance authorization.    Recommendations  Diet recommendations: Dysphagia 1 (puree);Nectar-thick liquid Liquids provided via: Cup;Straw (monitor) Medication  Administration: Crushed with puree Supervision: Staff to assist with self feeding;Full supervision/cueing for compensatory strategies Compensations: Minimize environmental distractions;Slow rate;Small sips/bites;Lingual sweep for clearance of pocketing;Follow solids with liquid Postural Changes and/or Swallow Maneuvers: Out of bed for meals;Seated upright 90 degrees;Upright 30-60 min after meal                 (Dietician f/u; Palliative Care f/u) Oral care BID;Oral care before and after PO;Staff/trained caregiver to provide oral care   Frequent or constant Supervision/Assistance Dysphagia, oropharyngeal phase (R13.12) (Demntia baseline)     All goals met        Jerilynn Som, MS, CCC-SLP Speech Language Pathologist Rehab Services; [facility] [phone] (ascom) Jahn Franchini  10/13/2023, 4:52 PM

## 2023-10-13 NOTE — Plan of Care (Signed)

## 2023-10-13 NOTE — Progress Notes (Signed)
Progress Note   Patient: Leah Ritter UJW:[id number] DOB: 1950-10-28 DOA: 10/09/2023     4 DOS: the patient was seen and examined on 10/13/2023   Brief hospital course: EMS responded and the blood glucose check was 565.  Vitals in the ED showed temperature of 97.8, respiration rate of 8, heart rate of 98, blood pressure 120/107, SpO2 of 94% on room air.  Serum sodium is 153, potassium 2.8, chloride 114, bicarb 27, BUN of 70, serum creatinine of 2.37, EGFR of 21, nonfasting blood glucose 589, WBC 13.5, hemoglobin 12.9, platelet 239.  Anion gap was not elevated at 12.  Magnesium elevated at 2.8.  Lactic acid 1.7.  Serum osmolality was elevated at 382.  Procalcitonin was less than 0.10.  Beta hydroxybutyrate was minimally elevated at 0.52.  UA was positive for moderate leukocytes and nitrates.  Blood cultures x 2 are in process.  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  01/12: admitted for hyperglycemia HHS, also UTI.  01/13: off insulin gtt and onto sq insulin  1/15.  Mental status still impaired.  Kept her eyes closed the entire time.  CT scan of the head negative.  Spoke with patient's POA and stated her baseline mental status as she can get fussy but usually speaks. 1/16.  Patient seen while family was in the room and patient was more awake.  Periods where she is mumbling and periods where she is able to speak.  As per family she is sleeping a lot more than usual.                Assessment and Plan: * Hyperosmolar hyperglycemic state (HHS) (HCC) Initially was on insulin drip but now on Semglee insulin 12 units  at bedtime with sliding scale.  Hypernatremia Yesterday's sodium 146 on half-normal saline.  Peak sodium 161.  Usually this is a poor prognostic factor.  Likely will be unable to keep up with nutritional needs.  Palliative care consultation.  Patient was previously followed by hospice at facility but as per daughter they have signed off.  Acute cystitis with hematuria E. coli growing out of urine culture on Rocephin.  Acute metabolic encephalopathy Mental status better while family at the bedside.  Patient does have underlying dementia.  Likely worsened with hyperosmolar hyperglycemic state, hypernatremia and UTI.  Acute kidney injury superimposed on CKD (HCC) AKI on CKD stage IIIa.  Creatinine 2.37 on presentation and down to 1.12.  Sleep apnea CPAP at night.  Essential hypertension Hydralazine 5 mg IV every 6 hours.  For SBP greater 160, 5 days ordered  Obesity (BMI 30-39.9) Class I with BMI 30.02        Subjective: Patient more alert while family is around.  Able to actually understand a few words but mumbling with other words.  Patient's family states that she is normally able to ambulate.  Physical Exam: Vitals:   10/12/23 1642 10/12/23 2025 10/13/23 0504 10/13/23 0722  BP: 120/60 129/82 120/70 134/65  Pulse: 79 85 76 73  Resp: 19 18 18 17   Temp: 98.9 F (37.2 C) 98.7 F (37.1 C) 98.5 F (36.9 C) 98.2 F (36.8 C)  TempSrc:      SpO2: 96% 99% 100% 100%  Weight:       Physical Exam HENT:     Head: Normocephalic.     Mouth/Throat:     Pharynx: No oropharyngeal exudate.  Eyes:     General: Lids are normal.     Conjunctiva/sclera: Conjunctivae normal.  Cardiovascular:     Rate and Rhythm: Normal rate and regular rhythm.     Heart sounds: Normal heart sounds, S1 normal and S2 normal.  Pulmonary:     Breath sounds: No decreased breath sounds, wheezing, rhonchi or rales.  Abdominal:     Palpations: Abdomen is soft.     Tenderness: There is no abdominal  tenderness.  Musculoskeletal:     Right lower leg: Swelling present.     Left lower leg: Swelling present.  Skin:    General: Skin is warm.     Findings: No rash.  Neurological:     Mental Status: She is alert.     Data Reviewed: Labs still pending from today.  Family Communication: Spoke with daughter at the bedside  Disposition: Status is: Inpatient Remains inpatient appropriate because: Patient's daughter would like patient walk around.  Continue to monitor.  Her facility may have to reevaluate her prior to accepting back.  Planned Discharge Destination: Back to her facility    Time spent: 28 minutes  Author: Alford Highland, MD 10/13/2023 12:38 PM  For on call review [email or web address].

## 2023-10-13 NOTE — TOC Progression Note (Signed)
Transition of Care [facility]) - Progression Note    Patient Details  Name: Meagan Reddy MRN: [id number] Date of Birth: [birth year]  [facility] CM/SW Contact  Allena Katz, LCSW Phone Number: 10/13/2023, 11:36 AM  Clinical Narrative:   CSW spoke with sheila at [facility] to see if pt is able to return. She would like [place] to email notes to her. CSW has emailed notes over to her. Velna Hatchet is coming to assesses patient tomorrow.          Expected Discharge Plan and Services                                               Social Determinants of Health (SDOH) Interventions SDOH Screenings   Food Insecurity: Patient Unable To Answer (10/09/2023)  Housing: Patient Unable To Answer (10/09/2023)  Transportation Needs: Patient Unable To Answer (10/09/2023)  Utilities: Patient Unable To Answer (10/09/2023)  Alcohol Screen: Low Risk  (09/05/2018)  Social Connections: Patient Unable To Answer (10/09/2023)  Tobacco Use: Low Risk  (10/09/2023)    Readmission Risk Interventions     No data to display

## 2023-10-13 NOTE — Consult Note (Signed)
Consultation Note Date: 10/13/2023   Patient Name: Leah Ritter  DOB: [birth year]  MRN: [id number]  Age / Sex: 73 y.o., female  PCP: [facility] Referring Physician: Alford Highland, MD  Reason for Consultation: Establishing goals of care  HPI/Patient Profile: Per H&P, Leah Ritter is a 73 year old female with history of prior insulin-dependent diabetes mellitus now non-insulin-dependent diabetes mellitus, hypertension, insomnia, anxiety, obstructive sleep apnea, dementia with delusion misidentification disorder, hyperlipidemia, history of CVA, who presents emergency department for chief concerns of hyperglycemia.   Clinical Assessment and Goals of Care: Notes and labs reviewed.  In to see patient.  She is currently sitting in bed with her daughter feeding her lunch.  She does not open her eyes or speak while eating.  Daughter states patient has been living in her facility since 2021.  She discusses that dementia onset occurred shortly after a CVA.    She states her mother uses a rollator walker, and at baseline will joke and sometimes dance.  She states her mother has difficulty with speech and so oftentimes she is unable to understand what she is saying.  Daughter states her mother has been under hospice care twice.  She states her mother was just discharged from hospice care.  She states she knew that patient was working towards an admission as typically her mother sleeps a lot more prior to hospital admissions.   We discussed her diagnosis, prognosis, GOC, EOL wishes disposition and options.  Created space and opportunity for patient  to explore thoughts and feelings regarding current medical information.   A detailed discussion was had today regarding advanced directives.  Concepts specific to code status, artifical feeding and hydration, IV antibiotics and rehospitalization were  discussed.  The difference between an aggressive medical intervention path and a comfort care path was discussed.  Values and goals of care important to patient and family were attempted to be elicited.  Discussed limitations of medical interventions to prolong quality of life in some situations and discussed the concept of human mortality.  Daughter states she would like to continue her mother's current hospitalization, as she is already here.  She states she would like to resume hospice level care upon discharge from this hospitalization.  She states she is not interested in returning to the hospital.  She states she will reach out to her previous hospice agency and then if needed will look to determine a different agency to follow for hospice care.  SUMMARY OF RECOMMENDATIONS   Continue current care.  Daughter would like hospice level care following discharge from this hospitalization.  Prognosis:  < 6 months      Primary Diagnoses: Present on Admission:  Hyperosmolar hyperglycemic state (HHS) (HCC)  Type II diabetes mellitus with renal manifestations (HCC)  Sleep apnea  Hypernatremia  HLD (hyperlipidemia)  Essential hypertension  GERD (gastroesophageal reflux disease)  Acute metabolic encephalopathy  Obesity (BMI 30-39.9)   I have reviewed the medical record, interviewed the patient and family, and examined the patient. The following aspects are  pertinent.  Past Medical History:  Diagnosis Date   A-fib (HCC)    Anxiety    CHF (congestive heart failure) (HCC)    Diabetes (HCC)    GERD (gastroesophageal reflux disease)    Hypertension    Sleep apnea    Stroke [facility])    Social History   Socioeconomic History   Marital status: Single    Spouse name: Not on file   Number of children: Not on file   Years of education: Not on file   Highest education level: Not on file  Occupational History   Not on file  Tobacco Use   Smoking status: Never   Smokeless tobacco: Never   Vaping Use   Vaping status: Never Used  Substance and Sexual Activity   Alcohol use: Not Currently   Drug use: Never   Sexual activity: Not Currently  Other Topics Concern   Not on file  Social History Narrative   Not on file   Social Drivers of Health   Financial Resource Strain: Not on file  Food Insecurity: Patient Unable To Answer (10/09/2023)   Hunger Vital Sign    Worried About Running Out of Food in the Last Year: Patient unable to answer    Ran Out of Food in the Last Year: Patient unable to answer  Transportation Needs: Patient Unable To Answer (10/09/2023)   PRAPARE - Transportation    Lack of Transportation (Medical): Patient unable to answer    Lack of Transportation (Non-Medical): Patient unable to answer  Physical Activity: Not on file  Stress: Not on file  Social Connections: Patient Unable To Answer (10/09/2023)   Social Connection and Isolation Panel [NHANES]    Frequency of Communication with Friends and Family: Patient unable to answer    Frequency of Social Gatherings with Friends and Family: Patient unable to answer    Attends Religious Services: Patient unable to answer    Active Member of Clubs or Organizations: Patient unable to answer    Attends Banker Meetings: Patient unable to answer    Marital Status: Patient unable to answer   Family History  Problem Relation Age of Onset   Diabetes Mother    Breast cancer Mother    Stroke Father    Hypertension Father    Kidney disease Brother    Cancer Brother    Scheduled Meds:  heparin  5,000 Units Subcutaneous Q8H   insulin aspart  0-15 Units Subcutaneous TID WC   insulin aspart  0-5 Units Subcutaneous QHS   insulin glargine-yfgn  12 Units Subcutaneous QHS   mirtazapine  15 mg Oral QHS   mouth rinse  15 mL Mouth Rinse 4 times per day   QUEtiapine  12.5 mg Oral QHS   Continuous Infusions:  cefTRIAXone (ROCEPHIN)  IV Stopped (10/12/23 1537)   PRN Meds:.dextrose, docusate sodium,  hydrALAZINE, metoprolol tartrate, mouth rinse, polyethylene glycol Medications Prior to Admission:  Prior to Admission medications   Medication Sig Start Date End Date Taking? Authorizing Provider  amLODipine (NORVASC) 5 MG tablet Take 1 tablet (5 mg total) by mouth daily. 07/15/21  Yes Pokhrel, Laxman, MD  bisacodyl (DULCOLAX) 5 MG EC tablet Take 2 tablets (10 mg total) by mouth daily as needed for moderate constipation. 02/02/23  Yes Gillis Santa, MD  melatonin 5 MG TABS Take 5 mg by mouth at bedtime.   Yes [provider]  mirabegron ER (MYRBETRIQ) 25 MG TB24 tablet Take 25 mg by mouth daily.   Yes  [provider]  QUEtiapine (SEROQUEL) 25 MG tablet Take 25 mg by mouth at bedtime.   Yes [provider]  traMADol (ULTRAM) 50 MG tablet Take 50 mg by mouth every 6 (six) hours as needed.   Yes [provider]  bisacodyl (DULCOLAX) 10 MG suppository Place 10 mg rectally as needed for moderate constipation. Patient not taking: Reported on 10/10/2023    [provider]  feeding supplement (ENSURE ENLIVE / ENSURE PLUS) LIQD Take 237 mLs by mouth 3 (three) times daily between meals. 07/14/21   Pokhrel, Rebekah [place], MD  LORazepam (ATIVAN) 0.5 MG tablet Take 1 tablet (0.5 mg total) by mouth every 4 (four) hours as needed for seizure, anxiety or sedation. 02/02/23   Gillis Santa, MD  mirtazapine (REMERON SOL-TAB) 15 MG disintegrating tablet Take 1 tablet (15 mg total) by mouth at bedtime. 05/08/21   Arnetha Courser, MD  nystatin (MYCOSTATIN) 100000 UNIT/ML suspension Take 5 mLs (500,000 Units total) by mouth 4 (four) times daily. Patient not taking: Reported on 10/10/2023 02/02/23   Gillis Santa, MD  polyethylene glycol (MIRALAX / GLYCOLAX) 17 g packet Take 17 g by mouth 2 (two) times daily. Patient not taking: Reported on 10/10/2023 02/02/23   Gillis Santa, MD  PRESCRIPTION MEDICATION Apply 2 mg topically 2 (two) times daily as needed (agitation or seizure activity).  Lorazepam 2mg /mL Topical Gel Patient not taking: Reported on 10/10/2023    [provider]   Allergies  Allergen Reactions   Oxycodone-Acetaminophen Nausea And Vomiting    Other reaction(s): Vomiting   Ezetimibe Other (See Comments)    Reaction unknown   Lisinopril Other (See Comments)    Reaction unknown Other reaction(s): Unknown   Statins Other (See Comments)    Reaction unknown    Review of Systems  Unable to perform ROS   Physical Exam Constitutional:      Comments: Eyes closed but eating well as she is being fed by her daughter.  Pulmonary:     Effort: Pulmonary effort is normal.     Vital Signs: BP 134/65 (BP Location: Left Arm)   Pulse 73   Temp 98.2 F (36.8 C)   Resp 17   Wt 92.2 kg   SpO2 100%   BMI 30.02 kg/m  Pain Scale: 0-10   Pain Score: 0-No pain   SpO2: SpO2: 100 % O2 Device:SpO2: 100 % O2 Flow Rate: .   IO: Intake/output summary:  Intake/Output Summary (Last 24 hours) at 10/13/2023 1424 Last data filed at 10/13/2023 1300 Gross per 24 hour  Intake 3559.93 ml  Output --  Net 3559.93 ml    LBM: Last BM Date :  (not able to get date of last BM) Baseline Weight: Weight: 92.2 kg Most recent weight: Weight: 92.2 kg        Signed by: Morton Stall, NP   Please contact Palliative Medicine Team phone at [phone] for questions and concerns.  For individual provider: See Loretha Stapler

## 2023-10-14 DIAGNOSIS — E870 Hyperosmolality and hypernatremia: Secondary | ICD-10-CM | POA: Diagnosis not present

## 2023-10-14 DIAGNOSIS — G9341 Metabolic encephalopathy: Secondary | ICD-10-CM | POA: Diagnosis not present

## 2023-10-14 DIAGNOSIS — Z7189 Other specified counseling: Secondary | ICD-10-CM | POA: Diagnosis not present

## 2023-10-14 DIAGNOSIS — N3001 Acute cystitis with hematuria: Secondary | ICD-10-CM | POA: Diagnosis not present

## 2023-10-14 DIAGNOSIS — E1100 Type 2 diabetes mellitus with hyperosmolarity without nonketotic hyperglycemic-hyperosmolar coma (NKHHC): Secondary | ICD-10-CM | POA: Diagnosis not present

## 2023-10-14 LAB — BASIC METABOLIC PANEL
Anion gap: 8 (ref 5–15)
BUN: 25 mg/dL — ABNORMAL HIGH (ref 8–23)
CO2: 25 mmol/L (ref 22–32)
Calcium: 8.6 mg/dL — ABNORMAL LOW (ref 8.9–10.3)
Chloride: 108 mmol/L (ref 98–111)
Creatinine, Ser: 1.04 mg/dL — ABNORMAL HIGH (ref 0.44–1.00)
GFR, Estimated: 57 mL/min — ABNORMAL LOW (ref >60)
Glucose, Bld: 120 mg/dL — ABNORMAL HIGH (ref 70–99)
Potassium: 3.5 mmol/L (ref 3.5–5.1)
Sodium: 141 mmol/L (ref 135–145)

## 2023-10-14 LAB — CULTURE, BLOOD (ROUTINE X 2)
Culture: NO GROWTH
Culture: NO GROWTH
Special Requests: ADEQUATE

## 2023-10-14 LAB — CBC
HCT: 35.7 % — ABNORMAL LOW (ref 36.0–46.0)
Hemoglobin: 11.5 g/dL — ABNORMAL LOW (ref 12.0–15.0)
MCH: 29.7 pg (ref 26.0–34.0)
MCHC: 32.2 g/dL (ref 30.0–36.0)
MCV: 92.2 fL (ref 80.0–100.0)
Platelets: 158 10*3/uL (ref 150–400)
RBC: 3.87 MIL/uL (ref 3.87–5.11)
RDW: 12.8 % (ref 11.5–15.5)
WBC: 6.3 10*3/uL (ref 4.0–10.5)
nRBC: 0 % (ref 0.0–0.2)

## 2023-10-14 LAB — GLUCOSE, CAPILLARY
Glucose-Capillary: 123 mg/dL — ABNORMAL HIGH (ref 70–99)
Glucose-Capillary: 235 mg/dL — ABNORMAL HIGH (ref 70–99)
Glucose-Capillary: 248 mg/dL — ABNORMAL HIGH (ref 70–99)
Glucose-Capillary: 103 mg/dL — ABNORMAL HIGH (ref 70–99)

## 2023-10-14 NOTE — Progress Notes (Signed)
PT Cancellation Note  Patient Details Name: Leah Ritter MRN: [id number] DOB: 04/02/1951   Cancelled Treatment:    Reason Eval/Treat Not Completed: Other (comment) (unable due to poor cognition) Patient not able to participate. Resisting mobility. Will continue to re-assess for appropriateness for PT intervention.   Vanessa Alesi 10/14/2023, 10:08 AM

## 2023-10-14 NOTE — [place] FL2 (Signed)
[place] MEDICAID FL2 LEVEL OF CARE FORM     IDENTIFICATION  Patient Name: Leah Ritter Birthdate: [birth year] Sex: female Admission Date (Current Location): 10/09/2023  [place] and [place] Number:  Chiropodist and Address:  [facility], [address] [place], [place] [address]      Provider Number: [id number]  Attending Physician Name and Address:  Alford Highland, MD  Relative Name and Phone Number:  Leah Ritter Guardian) (Daughter)  [phone] (Mobile)    Current Level of Care: Hospital Recommended Level of Care: Assisted Living Facility (with hospice) Prior Approval Number:    Date Approved/Denied:   PASRR Number:    Discharge Plan: Domiciliary (Rest home) (with hospice)    Current Diagnoses: Patient Active Problem List   Diagnosis Date Noted   Acute kidney injury superimposed on CKD (HCC) 10/12/2023   Acute cystitis with hematuria 10/12/2023   Hyperosmolar hyperglycemic state (HHS) (HCC) 10/09/2023   Hypernatremia 01/25/2023   Hyperglycemia without ketosis 01/21/2023   Seizure (HCC) 07/16/2022   Sepsis due to gram-negative UTI (HCC) 07/16/2022   Dyslipidemia 07/16/2022   Essential hypertension 07/16/2022   Paroxysmal atrial fibrillation (HCC) 07/16/2022   Dementia with behavioral disturbance (HCC) 07/16/2022   Cystocele with prolapse 12/15/2021   Bacteremia 12/12/2021   Abnormal LFTs 12/11/2021   Hyperkalemia 12/10/2021   Anemia 12/10/2021   Acute metabolic encephalopathy 07/06/2021   Hypothermia 07/06/2021   Type II diabetes mellitus with renal manifestations (HCC) 07/06/2021   History of stroke    Chronic diastolic CHF (congestive heart failure) (HCC)    Depression with anxiety    CKD (chronic kidney disease), stage IIIb    Aspiration pneumonia (HCC)    Nausea & vomiting    Hyperglycemia    SIRS (systemic inflammatory response syndrome) (HCC) 01/09/2019   Acute on chronic systolic CHF (congestive  heart failure) (HCC) 01/09/2019   Pain in joint involving ankle and foot 01/03/2018   Localized, primary osteoarthritis of shoulder region 01/03/2018   Rupture of tendon of biceps, long head 01/03/2018   Disorder of bursae of shoulder region 01/03/2018   Screening declined by patient 10/25/2017   Full thickness rotator cuff tear 04/21/2016   Chronic bilateral low back pain without sciatica 02/10/2016   H/O rotator cuff tear 02/10/2016   History of vitamin D deficiency 02/10/2016   Type 2 diabetes mellitus without complication, with long-term current use of insulin (HCC) 02/10/2016   HLD (hyperlipidemia) 11/24/2006   Obesity (BMI 30-39.9) 11/24/2006   POST TRAUMATIC STRESS DISORDER 11/24/2006   DEPRESSIVE DISORDER, NOS 11/24/2006   HYPERTENSION, BENIGN SYSTEMIC 11/24/2006   Allergic rhinitis 11/24/2006   GERD (gastroesophageal reflux disease) 11/24/2006   OSTEOARTHRITIS, MULTI SITES 11/24/2006   Sleep apnea 11/24/2006    Orientation RESPIRATION BLADDER Height & Weight        Normal Incontinent Weight: 203 lb 4.2 oz (92.2 kg) Height:     BEHAVIORAL SYMPTOMS/MOOD NEUROLOGICAL BOWEL NUTRITION STATUS      Incontinent    AMBULATORY STATUS COMMUNICATION OF NEEDS Skin     Verbally Normal                       Personal Care Assistance Level of Assistance              Functional Limitations Info             SPECIAL CARE FACTORS FREQUENCY  Contractures Contractures Info: Not present    Additional Factors Info  Code Status, Allergies Code Status Info: DNR Allergies Info: Oxycodone-acetaminophen, Ezetimibe, Lisinopril, Statins           Current Medications (10/14/2023):  This is the current hospital active medication list Current Facility-Administered Medications  Medication Dose Route Frequency Provider Last Rate Last Admin   dextrose 50 % solution 0-50 mL  0-50 mL Intravenous PRN Cox, Amy N, DO       docusate sodium (COLACE)  capsule 100 mg  100 mg Oral BID PRN Sunnie Nielsen, DO       heparin injection 5,000 Units  5,000 Units Subcutaneous Q8H Cox, Amy N, DO   5,000 Units at 10/14/23 0757   hydrALAZINE (APRESOLINE) injection 5 mg  5 mg Intravenous Q6H PRN Cox, Amy N, DO   5 mg at 10/10/23 1819   insulin aspart (novoLOG) injection 0-15 Units  0-15 Units Subcutaneous TID WC Sunnie Nielsen, DO   2 Units at 10/14/23 1610   insulin aspart (novoLOG) injection 0-5 Units  0-5 Units Subcutaneous QHS Sunnie Nielsen, DO   4 Units at 10/11/23 2307   insulin glargine-yfgn (SEMGLEE) injection 12 Units  12 Units Subcutaneous QHS Sunnie Nielsen, DO   12 Units at 10/13/23 2215   metoprolol tartrate (LOPRESSOR) injection 5 mg  5 mg Intravenous Q4H PRN Cox, Amy N, DO       mirtazapine (REMERON) tablet 15 mg  15 mg Oral QHS Alford Highland, MD   15 mg at 10/13/23 2215   Oral care mouth rinse  15 mL Mouth Rinse 4 times per day Alford Highland, MD   15 mL at 10/14/23 9604   Oral care mouth rinse  15 mL Mouth Rinse PRN Alford Highland, MD       polyethylene glycol (MIRALAX / GLYCOLAX) packet 17 g  17 g Oral Daily PRN Sunnie Nielsen, DO       QUEtiapine (SEROQUEL) tablet 12.5 mg  12.5 mg Oral QHS Alford Highland, MD   12.5 mg at 10/13/23 2214     Discharge Medications: Please see discharge summary for a list of discharge medications.  Relevant Imaging Results:  Relevant Lab Results:   Additional Information SS [id number]  [name], LCSW

## 2023-10-14 NOTE — TOC Progression Note (Signed)
Transition of Care [facility]) - Progression Note    Patient Details  Name: Leah Ritter MRN: [id number] Date of Birth: [birth year]  Transition of Care [facility]) CM/SW Contact  Allena Katz, LCSW Phone Number: 10/14/2023, 11:04 AM  Clinical Narrative:   Velna Hatchet at [facility] reports they can take pt back Monday if choosing to go back with hospice. Suzette daughter would like for her to go back with hospice on monday         Expected Discharge Plan and Services                                               Social Determinants of Health (SDOH) Interventions SDOH Screenings   Food Insecurity: Patient Unable To Answer (10/09/2023)  Housing: Patient Unable To Answer (10/09/2023)  Transportation Needs: Patient Unable To Answer (10/09/2023)  Utilities: Patient Unable To Answer (10/09/2023)  Alcohol Screen: Low Risk  (09/05/2018)  Social Connections: Patient Unable To Answer (10/09/2023)  Tobacco Use: Low Risk  (10/09/2023)    Readmission Risk Interventions     No data to display

## 2023-10-14 NOTE — Progress Notes (Signed)
Daily Progress Note   Patient Name: Leah Ritter       Date: 10/14/2023 DOB: 1950/10/04  Age: 73 y.o. MRN#: [id number] Attending Physician: Alford Highland, MD Primary Care Physician: Almetta Lovely, [facility] Admit Date: 10/09/2023  Reason for Consultation/Follow-up: Establishing goals of care  Subjective: Notes and labs reviewed.  In to see patient.  No family at bedside.  She is currently resting in bed with eyes closed.  She does not open her eyes or speak during my visit.  Even and unlabored respirations.  Patient is awaiting discharge back to her long-term care facility with hospice to resume services.  As goals are set into will sign off at this time.  Please reconsult if needs arise.  Length of Stay: 5  Current Medications: Scheduled Meds:   heparin  5,000 Units Subcutaneous Q8H   insulin aspart  0-15 Units Subcutaneous TID WC   insulin aspart  0-5 Units Subcutaneous QHS   insulin glargine-yfgn  12 Units Subcutaneous QHS   mirtazapine  15 mg Oral QHS   mouth rinse  15 mL Mouth Rinse 4 times per day   QUEtiapine  12.5 mg Oral QHS    Continuous Infusions:   PRN Meds: dextrose, docusate sodium, hydrALAZINE, metoprolol tartrate, mouth rinse, polyethylene glycol  Physical Exam Constitutional:      Comments: Eyes closed  Pulmonary:     Effort: Pulmonary effort is normal.             Vital Signs: BP (!) 136/58 (BP Location: Left Arm)   Pulse 84   Temp 98.5 F (36.9 C)   Resp 18   Wt 92.2 kg   SpO2 99%   BMI 30.02 kg/m  SpO2: SpO2: 99 % O2 Device: O2 Device: Room Air O2 Flow Rate:    Intake/output summary:  Intake/Output Summary (Last 24 hours) at 10/14/2023 1241 Last data filed at 10/14/2023 1055 Gross per 24 hour  Intake 240 ml  Output --  Net 240 ml    LBM: Last BM Date :  (not able to get date of last BM) Baseline Weight: Weight: 92.2 kg Most recent weight: Weight: 92.2 kg   Patient Active Problem List   Diagnosis Date Noted   Acute kidney injury superimposed on CKD (HCC) 10/12/2023   Acute cystitis with hematuria 10/12/2023   Hyperosmolar  hyperglycemic state (HHS) (HCC) 10/09/2023   Hypernatremia 01/25/2023   Hyperglycemia without ketosis 01/21/2023   Seizure (HCC) 07/16/2022   Sepsis due to gram-negative UTI (HCC) 07/16/2022   Dyslipidemia 07/16/2022   Essential hypertension 07/16/2022   Paroxysmal atrial fibrillation (HCC) 07/16/2022   Dementia with behavioral disturbance (HCC) 07/16/2022   Cystocele with prolapse 12/15/2021   Bacteremia 12/12/2021   Abnormal LFTs 12/11/2021   Hyperkalemia 12/10/2021   Anemia 12/10/2021   Acute metabolic encephalopathy 07/06/2021   Hypothermia 07/06/2021   Type II diabetes mellitus with renal manifestations (HCC) 07/06/2021   History of stroke    Chronic diastolic CHF (congestive heart failure) (HCC)    Depression with anxiety    CKD (chronic kidney disease), stage IIIb    Aspiration pneumonia (HCC)    Nausea & vomiting    Hyperglycemia    SIRS (systemic inflammatory response syndrome) (HCC) 01/09/2019   Acute on chronic systolic CHF (congestive heart failure) (HCC) 01/09/2019   Pain in joint involving ankle and foot 01/03/2018   Localized, primary osteoarthritis of shoulder region 01/03/2018   Rupture of tendon of biceps, long head 01/03/2018   Disorder of bursae of shoulder region 01/03/2018   Screening declined by patient 10/25/2017   Full thickness rotator cuff tear 04/21/2016   Chronic bilateral low back pain without sciatica 02/10/2016   H/O rotator cuff tear 02/10/2016   History of vitamin D deficiency 02/10/2016   Type 2 diabetes mellitus without complication, with long-term current use of insulin (HCC) 02/10/2016   HLD (hyperlipidemia) 11/24/2006   Obesity (BMI  30-39.9) 11/24/2006   POST TRAUMATIC STRESS DISORDER 11/24/2006   DEPRESSIVE DISORDER, NOS 11/24/2006   HYPERTENSION, BENIGN SYSTEMIC 11/24/2006   Allergic rhinitis 11/24/2006   GERD (gastroesophageal reflux disease) 11/24/2006   OSTEOARTHRITIS, MULTI SITES 11/24/2006   Sleep apnea 11/24/2006    Palliative Care Assessment & Plan     Recommendations/Plan: Continue plans for patient to discharge back with hospice to resume services. PMT will sign off as goals are currently set.  Code Status:    Code Status Orders  (From admission, onward)           Start     Ordered   10/09/23 1433  Do not attempt resuscitation (DNR)- Limited -Do Not Intubate (DNI)  Continuous       Question Answer Comment  If pulseless and not breathing No CPR or chest compressions.   In Pre-Arrest Conditions (Patient Is Breathing and Has A Pulse) Do not intubate. Provide all appropriate non-invasive medical interventions. Avoid ICU transfer unless indicated or required.   Consent: Discussion documented in EHR or advanced directives reviewed      10/09/23 1432           Code Status History     Date Active Date Inactive Code Status Order ID Comments User Context   01/21/2023 1719 02/03/2023 1700 DNR [id number]  Earley Brooke, MD ED   07/16/2022 0345 07/21/2022 1832 DNR [id number]  Mansy, Vernetta Honey, MD ED   12/11/2021 0102 12/15/2021 1839 Full Code [id number]  Gertha Calkin, MD ED   07/06/2021 1101 07/14/2021 2058 DNR [id number]  Lorretta Harp, MD ED   05/05/2021 2134 05/08/2021 1854 Full Code [id number]  Darlin Drop, DO ED   01/10/2019 0014 01/10/2019 1912 Full Code [id number]  Oralia Manis, MD Inpatient       Prognosis:  < 6 months   Thank you for allowing the Palliative Medicine Team to assist in the care of this  patient.   Morton Stall, NP  Please contact Palliative Medicine Team phone at [phone] for questions and concerns.

## 2023-10-14 NOTE — Progress Notes (Signed)
Progress Note   Patient: Leah Ritter UEA:[id number] DOB: 24-Dec-1950 DOA: 10/09/2023     5 DOS: the patient was seen and examined on 10/14/2023   Brief hospital course: EMS responded and the blood glucose check was 565.  Vitals in the ED showed temperature of 97.8, respiration rate of 8, heart rate of 98, blood pressure 120/107, SpO2 of 94% on room air.  Serum sodium is 153, potassium 2.8, chloride 114, bicarb 27, BUN of 70, serum creatinine of 2.37, EGFR of 21, nonfasting blood glucose 589, WBC 13.5, hemoglobin 12.9, platelet 239.  Anion gap was not elevated at 12.  Magnesium elevated at 2.8.  Lactic acid 1.7.  Serum osmolality was elevated at 382.  Procalcitonin was less than 0.10.  Beta hydroxybutyrate was minimally elevated at 0.52.  UA was positive for moderate leukocytes and nitrates.  Blood cultures x 2 are in process.  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  01/12: admitted for hyperglycemia HHS, also UTI.  01/13: off insulin gtt and onto sq insulin  1/15.  Mental status still impaired.  Kept her eyes closed the entire time.  CT scan of the head negative.  Spoke with patient's POA and stated her baseline mental status as she can get fussy but usually speaks. 1/16.  Patient seen while family was in the room and patient was more awake.  Periods where she is mumbling and periods where she is able to speak.  As per family she is sleeping a lot more than usual.  Sodium 143 1/17.  Patient more talkative this morning while I was in the room with nursing staff.  Able to understand more of the words that she was saying.  Sodium  141, creatinine 1.04                Assessment and Plan: * Hyperosmolar hyperglycemic state (HHS) (HCC) Initially was on insulin drip but now on Semglee insulin 12 units at bedtime with sliding scale.  Hypernatremia Today sodium 141 (off IV fluids).  Peak sodium 161.  Usually this is a poor prognostic factor.  Likely will be unable to keep up with nutritional needs.  Palliative care consultation appreciated.  Patient will go back on hospice services once back at facility.    Acute cystitis with hematuria E. coli growing out of urine culture and patient has completed 5 days of Rocephin.  Acute metabolic encephalopathy Mental status better the last couple days..  Patient does have underlying dementia.  Likely worsened with hyperosmolar hyperglycemic state, hypernatremia and UTI.  Acute kidney injury superimposed on CKD (HCC) AKI on CKD stage IIIa.  Creatinine 2.37 on presentation and down to 1.04.  Sleep apnea CPAP at night.  Obesity (BMI 30-39.9) Class I with BMI 30.02        Subjective: Patient seen while nursing was in the room.  Patient was a lot more talkative this morning and can actually understand more words than yesterday.  As per daughter did eat a better lunch today.  Patient initially admitted with hyperosmolar coma.  Physical Exam: Vitals:   10/13/23 0504 10/13/23 0722 10/13/23 1603 10/13/23 1944  BP: 120/70 134/65 113/69 (!) 136/58  Pulse: 76 73 81 84  Resp: 18  17 17 18   Temp: 98.5 F (36.9 C) 98.2 F (36.8 C)  98.5 F (36.9 C)  TempSrc:      SpO2: 100% 100% 100% 99%  Weight:       Physical Exam HENT:     Head: Normocephalic.     Mouth/Throat:     Pharynx: No oropharyngeal exudate.  Eyes:     General: Lids are normal.     Conjunctiva/sclera: Conjunctivae normal.  Cardiovascular:     Rate and Rhythm: Normal rate and regular rhythm.     Heart sounds: Normal heart sounds, S1 normal and S2 normal.  Pulmonary:     Breath sounds: No  decreased breath sounds, wheezing, rhonchi or rales.  Abdominal:     Palpations: Abdomen is soft.     Tenderness: There is no abdominal tenderness.  Musculoskeletal:     Right lower leg: Swelling present.     Left lower leg: Swelling present.  Skin:    General: Skin is warm.     Findings: No rash.  Neurological:     Mental Status: She is alert.     Data Reviewed: Creatinine 1.04, sodium 141, potassium 3.5  Family Communication: Spoke with patient's daughter on the phone  Disposition: Status is: Inpatient Remains inpatient appropriate because: Her facility will not take back till Monday.  Medically stable.  Planned Discharge Destination: Memory care unit    Time spent: 27 I minutes  Author: Alford Highland, MD 10/14/2023 2:34 PM  For on call review [email or web address].

## 2023-10-14 NOTE — Progress Notes (Signed)
Speech Language Pathology Treatment: Dysphagia  Patient Details Name: Leah Ritter MRN: [id number] DOB: April 12, 1951 Today's Date: 10/14/2023 Time: 1415-1500 SLP Time Calculation (min) (ACUTE ONLY): 45 min  Assessment / Plan / Recommendation Clinical Impression  Pt seen for ongoing toleration of current Dysphagia diet; Education w/ Daughter who was present. Dtr stated the plan is for pt to discharge back to her long-term care facility with Hospice to resume services.  Pt was resting in bed w/ eyes closed; minimal engagement. Dtr stated pt drank "2 of those thickened liquids today at lunch and seemed to like them" -- Dtr denied any difficulty swallowing when drinking them(no overt coughing).   Pt appears to present w/ oropharyngeal phase dysphagia in setting of declined Cognitive status; Baseline Dementia. ANY Cognitive decline can impact her overall awareness/timing of swallow and safety during po tasks which increases risk for aspiration, choking. Pt's risk for aspiration can be reduced when following general aspiration precautions, when given Supervision and support feeding at meals, and when using a modified diet consistency of Pureed foods w/ Nectar consistency liquids. She does require Mod verbal/visual cues for follow through during po tasks.        Education re: pt's swallowing/dysphagia; aspiration/aspiration pneumonia; diet consistency including Nectar liquids for their viscosity and thickening liquids to Nectar consistency; supplies and aspiration precautions were provided to Daughter/pt for Discharge to [facility]. Handouts, supplies given.     In setting of baseline Dementia and Cognitive decline and and her risk for aspiration, recommend continue the dysphagia level 1(Pureed) foods moistened for ease of oral phase w/ Nectar liquids; general aspiration precautions; reduce Distractions during meals and engage pt during meals for self-feeding as able. Pills Crushed in Puree for  safer swallowing as needed. Supervision and Support w/ feeding at meals.  MD/NSG updated.  ST services recommends follow w/ Palliative Care/Hospice for GOC/POC at [facility](briefly discussed QOL in setting of dysphagia) and education re: impact of Cognitive decline/Dementia on swallowing. Suspect pt is close to/at her baseline. Daughter agreed. No further Acute needs indicated. F/u at next venue of care as indicated.  Precautions posted in room, chart.       HPI HPI: Pt is a 73 y/o female presented to ED on 10/09/23 for hyperglycemia. Admitted for hyperglycemic state 2/2 UTI. PMH: Dementia, dysphagia, UTIs, T2DM, HTN, hx of CVA, OSA, insomnia.  SHe resides at a Memory Care facility and requires full support w/ ADLs.   Head CTs: no acute findings.   CXR: Low lung volumes and mild pulmonary vascular congestion.      SLP Plan  All goals met      Recommendations for follow up therapy are one component of a multi-disciplinary discharge planning process, led by the attending physician.  Recommendations may be updated based on patient status, additional functional criteria and insurance authorization.    Recommendations  Diet recommendations: Dysphagia 1 (puree);Nectar-thick liquid Liquids provided via: Cup;Straw (monitor) Medication Administration: Crushed with puree Supervision: Staff to assist with self feeding;Full supervision/cueing for compensatory strategies Compensations: Minimize environmental distractions;Slow rate;Small sips/bites;Lingual sweep for clearance of pocketing;Follow solids with liquid Postural Changes and/or Swallow Maneuvers: Out of bed for meals;Seated upright 90 degrees;Upright 30-60 min after meal                 (Palliative Care f/u) Oral care BID;Oral care before and after PO;Staff/trained caregiver to provide oral care   Frequent or constant Supervision/Assistance Dysphagia, oropharyngeal phase (R13.12) (Dementia Baseline; dependent for ADLs)  All  goals met       Jerilynn Som, MS, CCC-SLP Speech Language Pathologist Rehab Services; [facility] [phone] (ascom) Thamar Holik  10/14/2023, 4:49 PM

## 2023-10-14 NOTE — Plan of Care (Addendum)
Patient is alert and disoriented X 4. Denies any pain.  Problem: Education: Goal: Ability to describe self-care measures that may prevent or decrease complications (Diabetes Survival Skills Education) will improve Outcome: Progressing Goal: Individualized Educational Video(s) Outcome: Progressing   Problem: Coping: Goal: Ability to adjust to condition or change in health will improve Outcome: Progressing   Problem: Fluid Volume: Goal: Ability to maintain a balanced intake and output will improve Outcome: Progressing   Problem: Health Behavior/Discharge Planning: Goal: Ability to identify and utilize available resources and services will improve Outcome: Progressing Goal: Ability to manage health-related needs will improve Outcome: Progressing   Problem: Metabolic: Goal: Ability to maintain appropriate glucose levels will improve Outcome: Progressing   Problem: Nutritional: Goal: Maintenance of adequate nutrition will improve Outcome: Progressing Goal: Progress toward achieving an optimal weight will improve Outcome: Progressing   Problem: Skin Integrity: Goal: Risk for impaired skin integrity will decrease Outcome: Progressing   Problem: Tissue Perfusion: Goal: Adequacy of tissue perfusion will improve Outcome: Progressing   Problem: Education: Goal: Ability to describe self-care measures that may prevent or decrease complications (Diabetes Survival Skills Education) will improve Outcome: Progressing Goal: Individualized Educational Video(s) Outcome: Progressing   Problem: Cardiac: Goal: Ability to maintain an adequate cardiac output will improve Outcome: Progressing   Problem: Health Behavior/Discharge Planning: Goal: Ability to identify and utilize available resources and services will improve Outcome: Progressing Goal: Ability to manage health-related needs will improve Outcome: Progressing   Problem: Fluid Volume: Goal: Ability to achieve a balanced intake  and output will improve Outcome: Progressing   Problem: Metabolic: Goal: Ability to maintain appropriate glucose levels will improve Outcome: Progressing   Problem: Nutritional: Goal: Maintenance of adequate nutrition will improve Outcome: Progressing Goal: Maintenance of adequate weight for body size and type will improve Outcome: Progressing   Problem: Respiratory: Goal: Will regain and/or maintain adequate ventilation Outcome: Progressing   Problem: Urinary Elimination: Goal: Ability to achieve and maintain adequate renal perfusion and functioning will improve Outcome: Progressing   Problem: Education: Goal: Knowledge of General Education information will improve Description: Including pain rating scale, medication(s)/side effects and non-pharmacologic comfort measures Outcome: Progressing   Problem: Health Behavior/Discharge Planning: Goal: Ability to manage health-related needs will improve Outcome: Progressing   Problem: Clinical Measurements: Goal: Ability to maintain clinical measurements within normal limits will improve Outcome: Progressing Goal: Will remain free from infection Outcome: Progressing Goal: Diagnostic test results will improve Outcome: Progressing Goal: Respiratory complications will improve Outcome: Progressing Goal: Cardiovascular complication will be avoided Outcome: Progressing   Problem: Activity: Goal: Risk for activity intolerance will decrease Outcome: Progressing   Problem: Nutrition: Goal: Adequate nutrition will be maintained Outcome: Progressing   Problem: Coping: Goal: Level of anxiety will decrease Outcome: Progressing   Problem: Elimination: Goal: Will not experience complications related to bowel motility Outcome: Progressing Goal: Will not experience complications related to urinary retention Outcome: Progressing   Problem: Pain Management: Goal: General experience of comfort will improve Outcome: Progressing    Problem: Safety: Goal: Ability to remain free from injury will improve Outcome: Progressing   Problem: Skin Integrity: Goal: Risk for impaired skin integrity will decrease Outcome: Progressing

## 2023-10-14 NOTE — Progress Notes (Addendum)
[facility] Liaison Note   Patient's daughter called [facility] office today, asking to speak to someone about Hospice services.  TOC, Darrian Shoffner, [address], gavie HL permission to contact patient's daughter, Zada Finders. Ms Joseph Art had several questions about the differences between Palliative Care and Hospice services.  HL explained the differences in the two services.  She stated that patient is from [facility], and the plan is for her to return there Monday.  She reported that patient was followed by [facility].  She is not sure if she wants to continue with [name], or change to  [facility].  She asked that HL follow up with her tomorrow after she has given it some thought.      [facility] information and contact numbers given to family & above information shared with TOC.   Please call with any questions/concerns.    Thank you for the opportunity to participate in this patient's care.   [facility] Liaison [phone]

## 2023-10-15 DIAGNOSIS — G9341 Metabolic encephalopathy: Secondary | ICD-10-CM | POA: Diagnosis not present

## 2023-10-15 DIAGNOSIS — N3001 Acute cystitis with hematuria: Secondary | ICD-10-CM | POA: Diagnosis not present

## 2023-10-15 DIAGNOSIS — E870 Hyperosmolality and hypernatremia: Secondary | ICD-10-CM | POA: Diagnosis not present

## 2023-10-15 DIAGNOSIS — E1100 Type 2 diabetes mellitus with hyperosmolarity without nonketotic hyperglycemic-hyperosmolar coma (NKHHC): Secondary | ICD-10-CM | POA: Diagnosis not present

## 2023-10-15 DIAGNOSIS — G4730 Sleep apnea, unspecified: Secondary | ICD-10-CM

## 2023-10-15 DIAGNOSIS — K5909 Other constipation: Secondary | ICD-10-CM

## 2023-10-15 DIAGNOSIS — K5900 Constipation, unspecified: Secondary | ICD-10-CM | POA: Insufficient documentation

## 2023-10-15 LAB — GLUCOSE, CAPILLARY
Glucose-Capillary: 160 mg/dL — ABNORMAL HIGH (ref 70–99)
Glucose-Capillary: 195 mg/dL — ABNORMAL HIGH (ref 70–99)
Glucose-Capillary: 243 mg/dL — ABNORMAL HIGH (ref 70–99)
Glucose-Capillary: 92 mg/dL (ref 70–99)

## 2023-10-15 MED ORDER — DOCUSATE SODIUM 100 MG PO CAPS: 100.0000 mg | ORAL_CAPSULE | Freq: Two times a day (BID) | ORAL | Status: DC

## 2023-10-15 MED ORDER — DOCUSATE SODIUM 50 MG/5ML PO LIQD
100.0000 mg | Freq: Two times a day (BID) | ORAL | Status: DC
Start: 2023-10-15 — End: 2023-10-19
  Administered 2023-10-15 – 2023-10-18 (×6): 100 mg via ORAL
  Filled 2023-10-15 (×8): qty 10

## 2023-10-15 MED ORDER — LACTULOSE 10 GM/15ML PO SOLN
30.0000 g | Freq: Once | ORAL | Status: AC
  Administered 2023-10-15: 30 g via ORAL
  Filled 2023-10-15: qty 60

## 2023-10-15 NOTE — Assessment & Plan Note (Signed)
Bowel movement documented today on 1/18.

## 2023-10-15 NOTE — Progress Notes (Signed)
Patient remains free from any noted signs of acute changes in current physical status/condition.  No significant changes in current physical status/condition.  Patient has rested throughout current shift. Patient to continue to be monitored by staff until discharge.

## 2023-10-15 NOTE — Progress Notes (Signed)
Progress Note   Patient: Leah Ritter MWN:[id number] DOB: Dec 13, 1950 DOA: 10/09/2023     6 DOS: the patient was seen and examined on 10/15/2023   Brief hospital course: EMS responded and the blood glucose check was 565.  Vitals in the ED showed temperature of 97.8, respiration rate of 8, heart rate of 98, blood pressure 120/107, SpO2 of 94% on room air.  Serum sodium is 153, potassium 2.8, chloride 114, bicarb 27, BUN of 70, serum creatinine of 2.37, EGFR of 21, nonfasting blood glucose 589, WBC 13.5, hemoglobin 12.9, platelet 239.  Anion gap was not elevated at 12.  Magnesium elevated at 2.8.  Lactic acid 1.7.  Serum osmolality was elevated at 382.  Procalcitonin was less than 0.10.  Beta hydroxybutyrate was minimally elevated at 0.52.  UA was positive for moderate leukocytes and nitrates.  Blood cultures x 2 are in process.  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  01/12: admitted for hyperglycemia HHS, also UTI.  01/13: off insulin gtt and onto sq insulin  1/15.  Mental status still impaired.  Kept her eyes closed the entire time.  CT scan of the head negative.  Spoke with patient's POA and stated her baseline mental status as she can get fussy but usually speaks. 1/16.  Patient seen while family was in the room and patient was more awake.  Periods where she is mumbling and periods where she is able to speak.  As per family she is sleeping a lot more than usual.  Sodium 143 1/17.  Patient more talkative this morning while I was in the room with nursing staff.  Able to understand more of the words that she was saying.  Sodium  141, creatinine 1.04 Patient had a bowel movement today.                Assessment and Plan: * Hyperosmolar hyperglycemic state (HHS) (HCC) Initially was on insulin drip but now on Semglee insulin 12 units at bedtime with sliding scale.  Hypernatremia Last sodium 141 (off IV fluids).  Peak sodium 161.  Usually this is a poor prognostic factor.  Likely will be unable to keep up with nutritional needs.  Palliative care consultation appreciated.  Patient will go back on hospice services once back at facility.    Acute cystitis with hematuria E. coli growing out of urine culture and patient has completed 5 days of Rocephin.  Acute metabolic encephalopathy Mental status better the last few days.  Patient does have underlying dementia.  Likely worsened with hyperosmolar hyperglycemic state, hypernatremia and UTI.  Acute kidney injury superimposed on CKD (HCC) AKI on CKD stage IIIa.  Creatinine 2.37 on presentation and down to 1.04.  Sleep apnea CPAP at night.  Constipation Bowel movement documented today on 1/18.  Obesity (BMI 30-39.9) Class I with BMI 30.02        Subjective: Less talkative this morning when I saw her but more talkative when daughter in the room.  Patient eating a good lunch as per daughter.  Medications given for constipation.  Physical Exam: Vitals:   10/14/23 2111 10/14/23 2253 10/15/23 0454 10/15/23 0810  BP: 134/62  (!) 152/82 (!) 131/54  Pulse: 75 75 83 65  Resp: 18  18 17   Temp: 97.8 F (36.6 C)  98.1 F (36.7 C) 98.5 F (36.9 C)  TempSrc:      SpO2: 100% 95% 97% 99%  Weight:       Physical Exam HENT:     Head: Normocephalic.     Mouth/Throat:     Pharynx: No oropharyngeal exudate.  Eyes:     General: Lids are normal.     Conjunctiva/sclera: Conjunctivae normal.  Cardiovascular:     Rate and Rhythm: Normal rate and regular rhythm.     Heart sounds: Normal heart sounds, S1 normal and S2 normal.  Pulmonary:     Breath sounds:  No decreased breath sounds, wheezing, rhonchi or rales.  Abdominal:     Palpations: Abdomen is soft.     Tenderness: There is no abdominal tenderness.  Musculoskeletal:     Right lower leg: Swelling present.     Left lower leg: Swelling present.  Skin:    General: Skin is warm.     Findings: No rash.  Neurological:     Mental Status: She is alert.     Data Reviewed: Last 4 sugars 248, 103, 160 and 195  Family Communication: Spoke with daughter at the bedside  Disposition: Status is: Inpatient Remains inpatient appropriate because: Her facility did not take her back on 1/17.  Stable to go back.  Because of this and the holiday weekend she will likely not get out of the hospital until 1/21.  Planned Discharge Destination: Back to her facility with hospice    Time spent: 27 minutes  Author: Alford Highland, MD 10/15/2023 3:54 PM  For on call review [email or web address].

## 2023-10-15 NOTE — Plan of Care (Signed)

## 2023-10-15 NOTE — Progress Notes (Signed)
Insurance claims handler Liaison Note  Follow up phone call to patient's daughter Zada Finders regarding possible hospice referral at [facility] at discharge.  Patient previous had [facility] but per daughter, she was discharged on 1.10.25.  Patient would like to have [facility] hospice services upon discharge.  Referral sent to referral intake office.  DME needs:  (previously provided by [place])  hopital bed, over bed table, rollator-  if patient not ambulatory- would need transport wheelchair.  Hospital liaison team will continue to follow through final disposition.  Please do not hesitate to call with any hospice related questions or concerns.  Norris Cross, RN Nurse Liaison [phone]

## 2023-10-16 DIAGNOSIS — N3001 Acute cystitis with hematuria: Secondary | ICD-10-CM | POA: Diagnosis not present

## 2023-10-16 DIAGNOSIS — G9341 Metabolic encephalopathy: Secondary | ICD-10-CM | POA: Diagnosis not present

## 2023-10-16 DIAGNOSIS — E870 Hyperosmolality and hypernatremia: Secondary | ICD-10-CM | POA: Diagnosis not present

## 2023-10-16 DIAGNOSIS — K5909 Other constipation: Secondary | ICD-10-CM

## 2023-10-16 DIAGNOSIS — E1100 Type 2 diabetes mellitus with hyperosmolarity without nonketotic hyperglycemic-hyperosmolar coma (NKHHC): Secondary | ICD-10-CM | POA: Diagnosis not present

## 2023-10-16 LAB — GLUCOSE, CAPILLARY
Glucose-Capillary: 122 mg/dL — ABNORMAL HIGH (ref 70–99)
Glucose-Capillary: 80 mg/dL (ref 70–99)
Glucose-Capillary: 271 mg/dL — ABNORMAL HIGH (ref 70–99)
Glucose-Capillary: 188 mg/dL — ABNORMAL HIGH (ref 70–99)

## 2023-10-16 MED ORDER — LACTULOSE 10 GM/15ML PO SOLN: 20.0000 g | Freq: Every day | ORAL | Status: DC | PRN

## 2023-10-16 NOTE — Plan of Care (Addendum)
Patient is alert and disoriented X 4. She removed her I/v last night, Md made aware, Dr Renae Gloss is ok not to have I/v on her. Denies any pain.  Problem: Education: Goal: Ability to describe self-care measures that may prevent or decrease complications (Diabetes Survival Skills Education) will improve Outcome: Progressing Goal: Individualized Educational Video(s) Outcome: Progressing   Problem: Coping: Goal: Ability to adjust to condition or change in health will improve Outcome: Progressing   Problem: Fluid Volume: Goal: Ability to maintain a balanced intake and output will improve Outcome: Progressing   Problem: Health Behavior/Discharge Planning: Goal: Ability to identify and utilize available resources and services will improve Outcome: Progressing Goal: Ability to manage health-related needs will improve Outcome: Progressing   Problem: Metabolic: Goal: Ability to maintain appropriate glucose levels will improve Outcome: Progressing   Problem: Nutritional: Goal: Maintenance of adequate nutrition will improve Outcome: Progressing Goal: Progress toward achieving an optimal weight will improve Outcome: Progressing   Problem: Skin Integrity: Goal: Risk for impaired skin integrity will decrease Outcome: Progressing   Problem: Tissue Perfusion: Goal: Adequacy of tissue perfusion will improve Outcome: Progressing   Problem: Education: Goal: Ability to describe self-care measures that may prevent or decrease complications (Diabetes Survival Skills Education) will improve Outcome: Progressing Goal: Individualized Educational Video(s) Outcome: Progressing   Problem: Cardiac: Goal: Ability to maintain an adequate cardiac output will improve Outcome: Progressing   Problem: Health Behavior/Discharge Planning: Goal: Ability to identify and utilize available resources and services will improve Outcome: Progressing Goal: Ability to manage health-related needs will  improve Outcome: Progressing   Problem: Fluid Volume: Goal: Ability to achieve a balanced intake and output will improve Outcome: Progressing   Problem: Metabolic: Goal: Ability to maintain appropriate glucose levels will improve Outcome: Progressing   Problem: Nutritional: Goal: Maintenance of adequate nutrition will improve Outcome: Progressing Goal: Maintenance of adequate weight for body size and type will improve Outcome: Progressing   Problem: Respiratory: Goal: Will regain and/or maintain adequate ventilation Outcome: Progressing   Problem: Urinary Elimination: Goal: Ability to achieve and maintain adequate renal perfusion and functioning will improve Outcome: Progressing   Problem: Education: Goal: Knowledge of General Education information will improve Description: Including pain rating scale, medication(s)/side effects and non-pharmacologic comfort measures Outcome: Progressing   Problem: Health Behavior/Discharge Planning: Goal: Ability to manage health-related needs will improve Outcome: Progressing   Problem: Clinical Measurements: Goal: Ability to maintain clinical measurements within normal limits will improve Outcome: Progressing Goal: Will remain free from infection Outcome: Progressing Goal: Diagnostic test results will improve Outcome: Progressing Goal: Respiratory complications will improve Outcome: Progressing Goal: Cardiovascular complication will be avoided Outcome: Progressing   Problem: Activity: Goal: Risk for activity intolerance will decrease Outcome: Progressing   Problem: Nutrition: Goal: Adequate nutrition will be maintained Outcome: Progressing   Problem: Coping: Goal: Level of anxiety will decrease Outcome: Progressing   Problem: Elimination: Goal: Will not experience complications related to bowel motility Outcome: Progressing Goal: Will not experience complications related to urinary retention Outcome: Progressing    Problem: Pain Management: Goal: General experience of comfort will improve Outcome: Progressing   Problem: Safety: Goal: Ability to remain free from injury will improve Outcome: Progressing   Problem: Skin Integrity: Goal: Risk for impaired skin integrity will decrease Outcome: Progressing

## 2023-10-16 NOTE — Progress Notes (Signed)
Sports coach Note  Follow up on referral for hospice at [facility].  Planned discharge back to facility on Tuesday (facility would not take her back until Tuesday due to holiday tomorrow).  Hospital liaison will continue to follow through final disposition. Please call with any hospice related questions or concerns.  Norris Cross, RN Nurse Liaison [phone]

## 2023-10-16 NOTE — Progress Notes (Signed)
Progress Note   Patient: Leah Ritter [id number]:[id number] DOB: 06/28/1951 DOA: 10/09/2023     7 DOS: the patient was seen and examined on 10/16/2023   Brief hospital course: EMS responded and the blood glucose check was 565.  Vitals in the ED showed temperature of 97.8, respiration rate of 8, heart rate of 98, blood pressure 120/107, SpO2 of 94% on room air.  Serum sodium is 153, potassium 2.8, chloride 114, bicarb 27, BUN of 70, serum creatinine of 2.37, EGFR of 21, nonfasting blood glucose 589, WBC 13.5, hemoglobin 12.9, platelet 239.  Anion gap was not elevated at 12.  Magnesium elevated at 2.8.  Lactic acid 1.7.  Serum osmolality was elevated at 382.  Procalcitonin was less than 0.10.  Beta hydroxybutyrate was minimally elevated at 0.52.  UA was positive for moderate leukocytes and nitrates.  Blood cultures x 2 are in process.  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  01/12: admitted for hyperglycemia HHS, also UTI.  01/13: off insulin gtt and onto sq insulin  1/15.  Mental status still impaired.  Kept her eyes closed the entire time.  CT scan of the head negative.  Spoke with patient's POA and stated her baseline mental status as she can get fussy but usually speaks. 1/16.  Patient seen while family was in the room and patient was more awake.  Periods where she is mumbling and periods where she is able to speak.  As per family she is sleeping a lot more than usual.  Sodium 143 1/17.  Patient more talkative this morning while I was in the room with nursing staff.  Able to understand more of the words that she was saying.  Sodium  141, creatinine 1.04 1/18.  Patient had a bowel movement today. 1/19.  I ordered labs today but they must be canceled for some reason.  Will reorder for tomorrow.                Assessment and Plan: * Hyperosmolar hyperglycemic state (HHS) (HCC) Initially was on insulin drip but now on Semglee insulin 12 units at bedtime with sliding scale.  Hypernatremia Last sodium 141 (off IV fluids).  Peak sodium 161.  Usually this is a poor prognostic factor.  Likely will be unable to keep up with nutritional needs.  Palliative care consultation appreciated.  Patient will go back to her facility on hospice services.  I ordered labs for today but I do not see any results.  Will order again for tomorrow.  Acute cystitis with hematuria E. coli growing out of urine culture and patient has completed 5 days of Rocephin.  Acute metabolic encephalopathy Improved from admission.  Patient does have underlying dementia.  Likely worsened with hyperosmolar hyperglycemic state, hypernatremia and UTI.  Acute kidney injury superimposed on CKD (HCC) AKI on CKD stage IIIa.  Creatinine 2.37 on presentation and down to 1.04.  Sleep apnea CPAP at night.  Constipation Bowel movement documented today on 1/18.  Obesity (BMI 30-39.9) Class I with BMI 30.02        Subjective: When I saw her this morning patient was talking quite a bit but I could not understand her.  Admitted 7 days ago with hyperosmolar  hyperglycemic state  Physical Exam: Vitals:   10/15/23 2107 10/16/23 0512 10/16/23 0833 10/16/23 1610  BP: (!) 149/127 117/66 124/65 130/63  Pulse: 88 77 67 82  Resp: 18 18 18 17   Temp: 97.6 F (36.4 C) (!) 97.4 F (36.3 C) (!) 97.5 F (36.4 C) 98.6 F (37 C)  TempSrc: Oral Oral Oral   SpO2: 96% 100% 100% 98%  Weight:       Physical Exam HENT:     Head: Normocephalic.  Eyes:     General: Lids are normal.     Conjunctiva/sclera: Conjunctivae normal.  Cardiovascular:     Rate and  Rhythm: Normal rate and regular rhythm.     Heart sounds: Normal heart sounds, S1 normal and S2 normal.  Pulmonary:     Breath sounds: No decreased breath sounds, wheezing, rhonchi or rales.  Abdominal:     Palpations: Abdomen is soft.     Tenderness: There is no abdominal tenderness.  Musculoskeletal:     Right lower leg: Swelling present.     Left lower leg: Swelling present.  Skin:    General: Skin is warm.     Findings: No rash.  Neurological:     Mental Status: She is alert.     Data Reviewed: I ordered labs today but I do not see any results.  Will order for tomorrow.  Family Communication: Left message for POA  Disposition: Status is: Inpatient Remains inpatient appropriate because: Her facility did not take her back on 1/17 and with the holiday weekend likely will not take back until 1/21.  Planned Discharge Destination: Her memory care facility with hospice    Time spent: 26 minutes  Author: Alford Highland, MD 10/16/2023 4:28 PM  For on call review [email or web address].

## 2023-10-17 DIAGNOSIS — G9341 Metabolic encephalopathy: Secondary | ICD-10-CM

## 2023-10-17 DIAGNOSIS — N3001 Acute cystitis with hematuria: Secondary | ICD-10-CM

## 2023-10-17 DIAGNOSIS — E870 Hyperosmolality and hypernatremia: Secondary | ICD-10-CM | POA: Diagnosis not present

## 2023-10-17 DIAGNOSIS — G4733 Obstructive sleep apnea (adult) (pediatric): Secondary | ICD-10-CM

## 2023-10-17 DIAGNOSIS — K219 Gastro-esophageal reflux disease without esophagitis: Secondary | ICD-10-CM

## 2023-10-17 DIAGNOSIS — E1100 Type 2 diabetes mellitus with hyperosmolarity without nonketotic hyperglycemic-hyperosmolar coma (NKHHC): Secondary | ICD-10-CM | POA: Diagnosis not present

## 2023-10-17 DIAGNOSIS — N189 Chronic kidney disease, unspecified: Secondary | ICD-10-CM

## 2023-10-17 DIAGNOSIS — N179 Acute kidney failure, unspecified: Secondary | ICD-10-CM

## 2023-10-17 DIAGNOSIS — K5909 Other constipation: Secondary | ICD-10-CM

## 2023-10-17 DIAGNOSIS — E669 Obesity, unspecified: Secondary | ICD-10-CM

## 2023-10-17 LAB — BASIC METABOLIC PANEL
Anion gap: 7 (ref 5–15)
BUN: 26 mg/dL — ABNORMAL HIGH (ref 8–23)
CO2: 24 mmol/L (ref 22–32)
Calcium: 8.7 mg/dL — ABNORMAL LOW (ref 8.9–10.3)
Chloride: 107 mmol/L (ref 98–111)
Creatinine, Ser: 1.09 mg/dL — ABNORMAL HIGH (ref 0.44–1.00)
GFR, Estimated: 54 mL/min — ABNORMAL LOW (ref >60)
Glucose, Bld: 132 mg/dL — ABNORMAL HIGH (ref 70–99)
Potassium: 3.7 mmol/L (ref 3.5–5.1)
Sodium: 138 mmol/L (ref 135–145)

## 2023-10-17 LAB — CBC
HCT: 34.6 % — ABNORMAL LOW (ref 36.0–46.0)
Hemoglobin: 11.4 g/dL — ABNORMAL LOW (ref 12.0–15.0)
MCH: 29.1 pg (ref 26.0–34.0)
MCHC: 32.9 g/dL (ref 30.0–36.0)
MCV: 88.3 fL (ref 80.0–100.0)
Platelets: 204 10*3/uL (ref 150–400)
RBC: 3.92 MIL/uL (ref 3.87–5.11)
RDW: 12.8 % (ref 11.5–15.5)
WBC: 7.3 10*3/uL (ref 4.0–10.5)
nRBC: 0 % (ref 0.0–0.2)

## 2023-10-17 LAB — GLUCOSE, CAPILLARY
Glucose-Capillary: 126 mg/dL — ABNORMAL HIGH (ref 70–99)
Glucose-Capillary: 179 mg/dL — ABNORMAL HIGH (ref 70–99)
Glucose-Capillary: 158 mg/dL — ABNORMAL HIGH (ref 70–99)
Glucose-Capillary: 207 mg/dL — ABNORMAL HIGH (ref 70–99)

## 2023-10-17 NOTE — Progress Notes (Signed)
Sports coach Note  Follow up on referral for hospice at [facility] upon discharge from [facility].  Patient with plans to d/c to facility tomorrow.  Spoke with staff at [facility], who state patient's equipment is still in place at facility.  Called [employer] and notified that patient has discharged from [facility] and will be admitted to [facility] upon discharge from hospital.  They will leave DME in place.  Please call with any hospice related questions or concerns.  Thank you for allowing participation in this patient's care.  Norris Cross, RN Nurse Liaison [phone]

## 2023-10-17 NOTE — Progress Notes (Signed)
Progress Note   Patient: Leah Ritter JYN:[id number] DOB: 06/22/1951 DOA: 10/09/2023     8 DOS: the patient was seen and examined on 10/17/2023   Brief hospital course: EMS responded and the blood glucose check was 565.  Vitals in the ED showed temperature of 97.8, respiration rate of 8, heart rate of 98, blood pressure 120/107, SpO2 of 94% on room air.  Serum sodium is 153, potassium 2.8, chloride 114, bicarb 27, BUN of 70, serum creatinine of 2.37, EGFR of 21, nonfasting blood glucose 589, WBC 13.5, hemoglobin 12.9, platelet 239.  Anion gap was not elevated at 12.  Magnesium elevated at 2.8.  Lactic acid 1.7.  Serum osmolality was elevated at 382.  Procalcitonin was less than 0.10.  Beta hydroxybutyrate was minimally elevated at 0.52.  UA was positive for moderate leukocytes and nitrates.  Blood cultures x 2 are in process.  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  01/12: admitted for hyperglycemia HHS, also UTI.  01/13: off insulin gtt and onto sq insulin  1/15.  Mental status still impaired.  Kept her eyes closed the entire time.  CT scan of the head negative.  Spoke with patient's POA and stated her baseline mental status as she can get fussy but usually speaks. 1/16.  Patient seen while family was in the room and patient was more awake.  Periods where she is mumbling and periods where she is able to speak.  As per family she is sleeping a lot more than usual.  Sodium 143 1/17.  Patient more talkative this morning while I was in the room with nursing staff.  Able to understand more of the words that she was saying.  Sodium  141, creatinine 1.04 1/18.  Patient had a bowel movement today. 1/19.  I ordered labs today but they must be canceled for some reason.  Will reorder for tomorrow. 1/20.  Sodium 138, creatinine 1.09, hemoglobin 11.4.                Assessment and Plan: * Hyperosmolar hyperglycemic state (HHS) (HCC) Initially was on insulin drip but now on Semglee insulin 12 units at bedtime with sliding scale.  Hypernatremia Last sodium 138 (off IV fluids).  Peak sodium 161.  Usually this is a poor prognostic factor.  Likely will be unable to keep up with nutritional needs.  Palliative care consultation appreciated.  Patient will go back to her facility on hospice services.   Acute cystitis with hematuria E. coli growing out of urine culture and patient has completed 5 days of Rocephin.  Acute metabolic encephalopathy Improved from admission.  Patient does have underlying dementia.  Likely worsened with hyperosmolar hyperglycemic state, hypernatremia and UTI.  Acute kidney injury superimposed on CKD (HCC) AKI on CKD stage IIIa.  Creatinine 2.37 on presentation and down to 1.09.  Sleep apnea CPAP at night.  Constipation Bowel movement documented today on 1/18.  Obesity (BMI 30-39.9) Class I with BMI 30.02        Subjective: Patient awake and talking this morning.  Hard to understand what she is saying.  Admitted with hyperglycemia.  Also found to have hypernatremia.  Physical Exam: Vitals:   10/16/23 2055 10/17/23 0546 10/17/23  0730 10/17/23 1532  BP: (!) 141/75 128/68 (!) 112/57 (!) 127/56  Pulse: 96 81 79 90  Resp:  20 17 17   Temp: 98.5 F (36.9 C) 98.2 F (36.8 C) 98.4 F (36.9 C) 98.1 F (36.7 C)  TempSrc:  Oral    SpO2: 97% 95% 95% 97%  Weight:       Physical Exam HENT:     Head: Normocephalic.  Eyes:     General: Lids are normal.     Conjunctiva/sclera: Conjunctivae normal.  Cardiovascular:     Rate and Rhythm: Normal rate and regular rhythm.     Heart  sounds: Normal heart sounds, S1 normal and S2 normal.  Pulmonary:     Breath sounds: No decreased breath sounds, wheezing, rhonchi or rales.  Abdominal:     Palpations: Abdomen is soft.     Tenderness: There is no abdominal tenderness.  Musculoskeletal:     Right lower leg: Swelling present.     Left lower leg: Swelling present.  Skin:    General: Skin is warm.     Findings: No rash.  Neurological:     Mental Status: She is alert.     Data Reviewed: Sodium 138, creatinine 1.09, hemoglobin 11.4  Family Communication: Spoke with POA on the phone  Disposition: Status is: Inpatient Remains inpatient appropriate because: Her facility would not take her on Friday the 17th  Planned Discharge Destination: Her facility will take on 1/21.  Hospice will follow    Time spent: 26 minutes  Author: Alford Highland, MD 10/17/2023 5:35 PM  For on call review [email or web address].

## 2023-10-17 NOTE — Plan of Care (Signed)

## 2023-10-18 DIAGNOSIS — E1100 Type 2 diabetes mellitus with hyperosmolarity without nonketotic hyperglycemic-hyperosmolar coma (NKHHC): Secondary | ICD-10-CM | POA: Diagnosis not present

## 2023-10-18 DIAGNOSIS — N3001 Acute cystitis with hematuria: Secondary | ICD-10-CM | POA: Diagnosis not present

## 2023-10-18 DIAGNOSIS — G9341 Metabolic encephalopathy: Secondary | ICD-10-CM | POA: Diagnosis not present

## 2023-10-18 DIAGNOSIS — E870 Hyperosmolality and hypernatremia: Secondary | ICD-10-CM | POA: Diagnosis not present

## 2023-10-18 LAB — GLUCOSE, CAPILLARY
Glucose-Capillary: 91 mg/dL (ref 70–99)
Glucose-Capillary: 214 mg/dL — ABNORMAL HIGH (ref 70–99)
Glucose-Capillary: 165 mg/dL — ABNORMAL HIGH (ref 70–99)

## 2023-10-18 MED ORDER — FOOD THICKENER (THICKENUP CLEAR): 1.0000 | ORAL | 0 refills | Status: AC | PRN

## 2023-10-18 MED ORDER — INSULIN LISPRO (1 UNIT DIAL) 100 UNIT/ML (KWIKPEN): 0.0000 [IU] | PEN_INJECTOR | Freq: Three times a day (TID) | SUBCUTANEOUS | 0 refills | Status: AC

## 2023-10-18 MED ORDER — POLYETHYLENE GLYCOL 3350 17 G PO PACK: 17.0000 g | PACK | Freq: Every day | ORAL | 0 refills | Status: AC | PRN

## 2023-10-18 MED ORDER — DOCUSATE SODIUM 50 MG/5ML PO LIQD: 100.0000 mg | Freq: Two times a day (BID) | ORAL | 0 refills | Status: AC

## 2023-10-18 MED ORDER — INSULIN GLARGINE-YFGN 100 UNIT/ML ~~LOC~~ SOPN: 12.0000 [IU] | PEN_INJECTOR | Freq: Every day | SUBCUTANEOUS | 0 refills | Status: AC

## 2023-10-18 MED ORDER — QUETIAPINE FUMARATE 25 MG PO TABS: 12.5000 mg | ORAL_TABLET | Freq: Every day | ORAL | 0 refills | Status: AC

## 2023-10-18 NOTE — Discharge Summary (Signed)
Physician Discharge Summary   Patient: Leah Ritter MRN: [id number] DOB: November 22, 1950  Admit date:     10/09/2023  Discharge date: 10/18/23  Discharge Physician: Alford Highland   PCP: Almet[facility]   Recommendations at discharge:    Follow up PCP at facility one day Hospice at facility  Discharge Diagnoses: Principal Problem:   Hyperosmolar hyperglycemic state (HHS) (HCC) Active Problems:   Hypernatremia   Acute metabolic encephalopathy   Acute cystitis with hematuria   Acute kidney injury superimposed on CKD (HCC)   Sleep apnea   GERD (gastroesophageal reflux disease)   HLD (hyperlipidemia)   Obesity (BMI 30-39.9)   Type II diabetes mellitus with renal manifestations [facility])   Essential hypertension   Constipation    Hospital Course: EMS responded and the blood glucose check was 565.  Vitals in the ED showed temperature of 97.8, respiration rate of 8, heart rate of 98, blood pressure 120/107, SpO2 of 94% on room air.  Serum sodium is 153, potassium 2.8, chloride 114, bicarb 27, BUN of 70, serum creatinine of 2.37, EGFR of 21, nonfasting blood glucose 589, WBC 13.5, hemoglobin 12.9, platelet 239.  Anion gap was not elevated at 12.  Magnesium elevated at 2.8.  Lactic acid 1.7.  Serum osmolality was elevated at 382.  Procalcitonin was less than 0.10.  Beta hydroxybutyrate was minimally elevated at 0.52.  UA was positive for moderate leukocytes and nitrates.  Blood cultures x 2 are in process.  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  01/12: admitted for hyperglycemia HHS, also UTI.  01/13: off  insulin gtt and onto sq insulin  1/15.  Mental status still impaired.  Kept her eyes closed the entire time.  CT scan of the head negative.  Spoke with patient's POA and stated her baseline mental status as she can get fussy but usually speaks. 1/16.  Patient seen while family was in the room and patient was more awake.  Periods where she is mumbling and periods where she is able to speak.  As per family she is sleeping a lot more than usual.  Sodium 143 1/17.  Patient more talkative this morning while I was in the room with nursing staff.  Able to understand more of the words that she was saying.  Sodium 141, creatinine 1.04 1/18.  Patient had a bowel movement today. 1/19.  I ordered labs today but they must be canceled for some reason.  Will reorder for tomorrow. 1/20.  Sodium 138, creatinine 1.09, hemoglobin 11.4.                Assessment and Plan: * Hyperosmolar hyperglycemic state (HHS) (HCC) Initially was on insulin drip but now on Semglee insulin 12 units at bedtime with sliding scale. Continue to check fingersticks qac and at bedtime.  Hypernatremia Last sodium 138 (off IV fluids).  Peak sodium 161.  Usually this is a poor prognostic factor.  Likely will be unable to keep up with nutritional needs.  Palliative care consultation appreciated.  Patient will go back to her facility on hospice services. Patient on dysphagia 1 diet with nectar thick liquids.  Acute cystitis with hematuria E. coli growing out of urine culture and patient has  completed 5 days of Rocephin.  Acute metabolic encephalopathy Improved from admission.  Patient does have underlying dementia.  Likely worsened with hyperosmolar hyperglycemic state, hypernatremia and UTI.  Acute kidney injury superimposed on CKD (HCC) AKI on CKD stage IIIa.  Creatinine 2.37 on presentation and down to 1.09.  Sleep apnea CPAP at night.  Constipation Bowel movement documented on 1/18 and 1/20.  Obesity (BMI  30-39.9) Class I with BMI 30.02         Consultants: Palliative care Procedures performed: none Disposition: back to facility with hospice following Diet recommendation:  Dysphagia 1 diet with nectar thick liquids DISCHARGE MEDICATION: Allergies as of 10/18/2023       Reactions   Oxycodone-acetaminophen Nausea And Vomiting   Other reaction(s): Vomiting   Ezetimibe Other (See Comments)   Reaction unknown   Lisinopril Other (See Comments)   Reaction unknown Other reaction(s): Unknown   Statins Other (See Comments)   Reaction unknown         Medication List     STOP taking these medications    amLODipine 5 MG tablet Commonly known as: NORVASC   LORazepam 0.5 MG tablet Commonly known as: ATIVAN   Myrbetriq 25 MG Tb24 tablet Generic drug: mirabegron ER   nystatin 100000 UNIT/ML suspension Commonly known as: MYCOSTATIN   PRESCRIPTION MEDICATION   traMADol 50 MG tablet Commonly known as: ULTRAM       TAKE these medications    bisacodyl 5 MG EC tablet Commonly known as: DULCOLAX Take 2 tablets (10 mg total) by mouth daily as needed for moderate constipation. What changed: Another medication with the same name was removed. Continue taking this medication, and follow the directions you see here.   docusate 50 MG/5ML liquid Commonly known as: COLACE Take 10 mLs (100 mg total) by mouth 2 (two) times daily.   feeding supplement Liqd Take 237 mLs by mouth 3 (three) times daily between meals.   food thickener Powd Commonly known as: RESOURCE THICKENUP CLEAR Take 1 Container by mouth as needed.   insulin glargine-yfgn 100 UNIT/ML Pen Commonly known as: SEMGLEE Inject 12 Units into the skin at bedtime. May substitute as needed per insurance.   insulin lispro 100 UNIT/ML KwikPen Commonly known as: HUMALOG Inject 0-11 Units into the skin 3 (three) times daily with meals. Check Blood Glucose (BG) and inject per scale: BG <150= 0 unit; BG 150-200= 1 unit; BG  201-250= 3 unit; BG 251-300= 5 unit; BG 301-350= 7 unit; BG 351-400= 9 unit; BG >400= 11 unit and Call Primary Care.   melatonin 5 MG Tabs Take 5 mg by mouth at bedtime.   mirtazapine 15 MG disintegrating tablet Commonly known as: REMERON SOL-TAB Take 1 tablet (15 mg total) by mouth at bedtime.   polyethylene glycol 17 g packet Commonly known as: MIRALAX / GLYCOLAX Take 17 g by mouth daily as needed for mild constipation. What changed:  when to take this reasons to take this   QUEtiapine 25 MG tablet Commonly known as: SEROQUEL Take 0.5 tablets (12.5 mg total) by mouth at bedtime. What changed: how much to take        Follow-up Information     [facility] Follow up.   Specialty: Geriatric Medicine Why: Hospital follow up Contact information: [address] [place] [address] [phone]                Discharge Exam: Filed Weights   10/11/23 2053  Weight: 92.2 kg  Physical Exam HENT:     Head: Normocephalic.  Eyes:     General: Lids are normal.     Conjunctiva/sclera: Conjunctivae normal.  Cardiovascular:     Rate and Rhythm: Normal rate and regular rhythm.     Heart sounds: Normal heart sounds, S1 normal and S2 normal.  Pulmonary:     Breath sounds: No decreased breath sounds, wheezing, rhonchi or rales.  Abdominal:     Palpations: Abdomen is soft.     Tenderness: There is no abdominal tenderness.  Musculoskeletal:     Right lower leg: Swelling present.     Left lower leg: Swelling present.  Skin:    General: Skin is warm.     Findings: No rash.  Neurological:     Mental Status: She is alert.      Condition at discharge: fair  The results of significant diagnostics from this hospitalization (including imaging, microbiology, ancillary and laboratory) are listed below for reference.   Imaging Studies: CT HEAD WO CONTRAST ( ) Result Date: 10/12/2023 CLINICAL DATA:  Mental status change, unknown cause EXAM:  CT HEAD WITHOUT CONTRAST TECHNIQUE: Contiguous axial images were obtained from the base of the skull through the vertex without intravenous contrast. RADIATION DOSE REDUCTION: This exam was performed according to the departmental dose-optimization program which includes automated exposure control, adjustment of the mA and/or kV according to patient size and/or use of iterative reconstruction technique. COMPARISON:  Head CT 10/09/2023 FINDINGS: Brain: No hemorrhage. No hydrocephalus. No extra-axial fluid collection. No mass effect no mass lesion. No CT evidence of an acute cortical infarct. Chronic right MCA territory infarct involving the right frontal lobe. Vascular: No hyperdense vessel or unexpected calcification. Skull: Normal. Negative for fracture or focal lesion. Sinuses/Orbits: 2 no middle ear or mastoid effusion. Paranasal sinuses are clear. Orbits are unremarkable. Other: None. IMPRESSION: No acute intracranial abnormality. Electronically Signed   By: Lorenza Cambridge M.D.   On: 10/12/2023 10:03   CT HEAD WO CONTRAST ( ) Result Date: 10/09/2023 CLINICAL DATA:  Stroke suspected, altered mental status EXAM: CT HEAD WITHOUT CONTRAST TECHNIQUE: Contiguous axial images were obtained from the base of the skull through the vertex without intravenous contrast. RADIATION DOSE REDUCTION: This exam was performed according to the departmental dose-optimization program which includes automated exposure control, adjustment of the mA and/or kV according to patient size and/or use of iterative reconstruction technique. COMPARISON:  02/03/2023 FINDINGS: Brain: No evidence of acute infarction, hemorrhage, mass, mass effect, or midline shift. No hydrocephalus or extra-axial fluid collection. Redemonstrated encephalomalacia in the right frontal lobe. Periventricular white matter changes, likely the sequela of chronic small vessel ischemic disease. Vascular: No hyperdense vessel. Skull: Negative for fracture or focal lesion.  Sinuses/Orbits: No acute finding. Other: The mastoid air cells are well aerated. IMPRESSION: No acute intracranial process. Electronically Signed   By: Wiliam Ke M.D.   On: 10/09/2023 16:17   DG Chest Portable 1 View Result Date: 10/09/2023 CLINICAL DATA:  Altered mental status. Possible diabetic ketoacidosis. EXAM: PORTABLE CHEST 1 VIEW COMPARISON:  One-view chest x-ray 01/21/2023 FINDINGS: The heart size is exaggerated by low lung volumes. Mild pulmonary vascular congestion is present. No significant edema is present. No effusion or pneumothorax is present. No focal airspace consolidation is present. IMPRESSION: Low lung volumes and mild pulmonary vascular congestion. Electronically Signed   By: Marin Roberts M.D.   On: 10/09/2023 12:41    Microbiology: Results for orders placed or performed during the hospital encounter of 10/09/23  Blood Culture (  routine x 2)     Status: None   Collection Time: 10/09/23 12:13 PM   Specimen: BLOOD  Result Value Ref Range Status   Specimen Description BLOOD RIGHT ANTECUBITAL  Final   Special Requests   Final    BOTTLES DRAWN AEROBIC AND ANAEROBIC Blood Culture adequate volume   Culture   Final    NO GROWTH 5 DAYS Performed at [facility], [address] [place], [place] [address]    Report Status 10/14/2023 FINAL  Final  Blood Culture (routine x 2)     Status: None   Collection Time: 10/09/23 12:28 PM   Specimen: BLOOD  Result Value Ref Range Status   Specimen Description BLOOD RIGHT ANTECUBITAL  Final   Special Requests   Final    BOTTLES DRAWN AEROBIC AND ANAEROBIC Blood Culture results may not be optimal due to an inadequate volume of blood received in culture bottles   Culture   Final    NO GROWTH 5 DAYS Performed at [facility], [address] [place], [place] [address]    Report Status 10/14/2023 FINAL  Final  Urine Culture     Status: Abnormal   Collection Time: 10/09/23  2:14 PM   Specimen: Urine, Random   Result Value Ref Range Status   Specimen Description   Final    URINE, RANDOM Performed at [facility], [address] [place], [place] [address]    Special Requests   Final    NONE Performed at [facility], [address] [place], [place] [address]    Culture 70,000 COLONIES/mL ESCHERICHIA COLI (A)  Final   Report Status 10/11/2023 FINAL  Final   Organism ID, Bacteria ESCHERICHIA COLI (A)  Final      Susceptibility   Escherichia coli - MIC*    AMPICILLIN <=2 SENSITIVE Sensitive     CEFAZOLIN <=4 SENSITIVE Sensitive     CEFEPIME <=0.12 SENSITIVE Sensitive     CEFTRIAXONE <=0.25 SENSITIVE Sensitive     CIPROFLOXACIN <=0.25 SENSITIVE Sensitive     GENTAMICIN <=1 SENSITIVE Sensitive     IMIPENEM <=0.25 SENSITIVE Sensitive     NITROFURANTOIN <=16 SENSITIVE Sensitive     TRIMETH/SULFA <=20 SENSITIVE Sensitive     AMPICILLIN/SULBACTAM <=2 SENSITIVE Sensitive     PIP/TAZO <=4 SENSITIVE Sensitive ug/mL    * 70,000 COLONIES/mL ESCHERICHIA COLI  MRSA Next Gen by PCR, Nasal     Status: None   Collection Time: 10/09/23  4:31 PM   Specimen: Nasal Mucosa; Nasal Swab  Result Value Ref Range Status   MRSA by PCR Next Gen NOT DETECTED NOT DETECTED Final    Comment: (NOTE) The GeneXpert MRSA Assay (FDA approved for NASAL specimens only), is one component of a comprehensive MRSA colonization surveillance program. It is not intended to diagnose MRSA infection nor to guide or monitor treatment for MRSA infections. Test performance is not FDA approved in patients less than 80 years old. Performed at [facility], [address] [place], [place] [address]     Labs: CBC: Recent Labs  Lab 10/14/23 0510 10/17/23 0507  WBC 6.3 7.3  HGB 11.5* 11.4*  HCT 35.7* 34.6*  MCV 92.2 88.3  PLT 158 204   Basic Metabolic Panel: Recent Labs  Lab 10/11/23 2130 10/12/23 0553 10/13/23 1316 10/14/23 0510 10/17/23 0507  NA 146* 146* 143 141 138  K  --  3.7 4.2  3.5 3.7  CL  --  114* 110 108 107  CO2  --  24 23 25 24   GLUCOSE  --  164* 246* 120* 132*  BUN  --  34* 30* 25* 26*  CREATININE  --  1.12* 1.13* 1.04* 1.09*  CALCIUM  --  8.4* 8.8* 8.6* 8.7*   Liver Function Tests: No results for input(s): "AST", "ALT", "ALKPHOS", "BILITOT", "PROT", "ALBUMIN" in the last 168 hours. CBG: Recent Labs  Lab 10/17/23 0728 10/17/23 1156 10/17/23 1637 10/17/23 2056 10/18/23 0745  GLUCAP 126* 179* 158* 207* 91    Discharge time spent: greater than 30 minutes.  Signed: Alford Highland, MD [facility] 10/18/2023

## 2023-10-18 NOTE — Progress Notes (Signed)
Patient transported off the unit with her green winter coat and black knit hat on, via EMS going to [facility]. Patient's daughter took all of the other belongings home with her around 50. Patient safety maintained.

## 2023-10-18 NOTE — TOC Transition Note (Addendum)
Transition of Care [facility]) - Discharge Note   Patient Details  Name: Leah Ritter MRN: [id number] Date of Birth: [birth year]  Transition of Care [facility]) CM/SW Contact:  Allena Katz, LCSW Phone Number: 10/18/2023, 1:00 PM   Clinical Narrative:   Pt discharging back to [facility] with [facility]. Medical necessity printed to [facility]it. DC summary and fl2 sent to [facility]. She states pt is going to room D4. Daughter Suzette notified.     Final next level of care: Assisted Living Barriers to Discharge: Barriers Resolved   Patient Goals and CMS Choice Patient states their goals for this hospitalization and ongoing recovery are:: go back to [facility] with hospice CMS Medicare.gov Compare Post Acute Care list provided to:: Patient Represenative (must comment) (daughter)        Discharge Placement                Patient to be transferred to facility by: [facility] Name of family member notified: suzette Patient and family notified of of transfer: 10/18/23  Discharge Plan and Services Additional resources added to the After Visit Summary for                                       Social Drivers of Health (SDOH) Interventions SDOH Screenings   Food Insecurity: Patient Unable To Answer (10/09/2023)  Housing: Patient Unable To Answer (10/09/2023)  Transportation Needs: Patient Unable To Answer (10/09/2023)  Utilities: Patient Unable To Answer (10/09/2023)  Alcohol Screen: Low Risk  (09/05/2018)  Social Connections: Patient Unable To Answer (10/09/2023)  Tobacco Use: Low Risk  (10/09/2023)     Readmission Risk Interventions     No data to display

## 2023-10-18 NOTE — [place] (Signed)
[place] MED[place]2 LEVEL OF CARE FORM     IDENTIFICATION  Patient Name: Leah Ritter Birthdate: December 21, 1950 Sex: female Admission Date (Current Location): 10/09/2023  [place] and [place] Number:  Chiropodist and Address:  [facility], [address], [place], [place] [address]      Provider Number: [id number]  Attending Physician Name and Address:  Alford Highland, MD  Relative Name and Phone Number:  Carie Caddy Guardian) (Daughter)  [phone] (Mobile)    Current Level of Care: Hospital Recommended Level of Care: Assisted Living Facility (with hospice) Prior Approval Number:    Date Approved/Denied:   PASRR Number:    Discharge Plan: Domiciliary (Rest home) (with hospice)    Current Diagnoses: Patient Active Problem List   Diagnosis Date Noted   Constipation 10/15/2023   Acute kidney injury superimposed on CKD (HCC) 10/12/2023   Acute cystitis with hematuria 10/12/2023   Hyperosmolar hyperglycemic state (HHS) (HCC) 10/09/2023   Hypernatremia 01/25/2023   Hyperglycemia without ketosis 01/21/2023   Seizure (HCC) 07/16/2022   Sepsis due to gram-negative UTI (HCC) 07/16/2022   Dyslipidemia 07/16/2022   Essential hypertension 07/16/2022   Paroxysmal atrial fibrillation (HCC) 07/16/2022   Dementia with behavioral disturbance (HCC) 07/16/2022   Cystocele with prolapse 12/15/2021   Bacteremia 12/12/2021   Abnormal LFTs 12/11/2021   Hyperkalemia 12/10/2021   Anemia 12/10/2021   Acute metabolic encephalopathy 07/06/2021   Hypothermia 07/06/2021   Type II diabetes mellitus with renal manifestations (HCC) 07/06/2021   History of stroke    Chronic diastolic CHF (congestive heart failure) (HCC)    Depression with anxiety    CKD (chronic kidney disease), stage IIIb    Aspiration pneumonia (HCC)    Nausea & vomiting    Hyperglycemia    SIRS (systemic inflammatory response syndrome) (HCC) 01/09/2019   Acute on chronic  systolic CHF (congestive heart failure) (HCC) 01/09/2019   Pain in joint involving ankle and foot 01/03/2018   Localized, primary osteoarthritis of shoulder region 01/03/2018   Rupture of tendon of biceps, long head 01/03/2018   Disorder of bursae of shoulder region 01/03/2018   Screening declined by patient 10/25/2017   Full thickness rotator cuff tear 04/21/2016   Chronic bilateral low back pain without sciatica 02/10/2016   H/O rotator cuff tear 02/10/2016   History of vitamin D deficiency 02/10/2016   Type 2 diabetes mellitus without complication, with long-term current use of insulin (HCC) 02/10/2016   HLD (hyperlipidemia) 11/24/2006   Obesity (BMI 30-39.9) 11/24/2006   POST TRAUMATIC STRESS DISORDER 11/24/2006   DEPRESSIVE DISORDER, NOS 11/24/2006   HYPERTENSION, BENIGN SYSTEMIC 11/24/2006   Allergic rhinitis 11/24/2006   GERD (gastroesophageal reflux disease) 11/24/2006   OSTEOARTHRITIS, MULTI SITES 11/24/2006   Sleep apnea 11/24/2006    Orientation RESPIRATION BLADDER Height & Weight        Normal Incontinent Weight: 203 lb 4.2 oz (92.2 kg) Height:     BEHAVIORAL SYMPTOMS/MOOD NEUROLOGICAL BOWEL NUTRITION STATUS      Incontinent    AMBULATORY STATUS COMMUNICATION OF NEEDS Skin     Verbally Normal                       Personal Care Assistance Level of Assistance              Functional Limitations Info             SPECIAL CARE FACTORS FREQUENCY  Contractures Contractures Info: Not present    Additional Factors Info  Code Status, Allergies Code Status Info: DNR Allergies Info: Oxycodone-acetaminophen, Ezetimibe, Lisinopril, Statins           Current Medications (10/18/2023):  This is the current hospital active medication list Current Facility-Administered Medications  Medication Dose Route Frequency Provider Last Rate Last Admin   dextrose 50 % solution 0-50 mL  0-50 mL Intravenous PRN Cox, Amy N, DO        docusate (COLACE) 50 MG/5ML liquid 100 mg  100 mg Oral BID Foye Deer, RPH   100 mg at 10/17/23 2303   heparin injection 5,000 Units  5,000 Units Subcutaneous Q8H Cox, Amy N, DO   5,000 Units at 10/18/23 0536   insulin aspart (novoLOG) injection 0-15 Units  0-15 Units Subcutaneous TID WC Sunnie Nielsen, DO   3 Units at 10/17/23 1723   insulin aspart (novoLOG) injection 0-5 Units  0-5 Units Subcutaneous QHS Sunnie Nielsen, DO   2 Units at 10/17/23 2258   insulin glargine-yfgn [facility]) injection 12 Units  12 Units Subcutaneous QHS Sunnie Nielsen, DO   12 Units at 10/17/23 2301   lactulose (CHRONULAC) 10 GM/15ML solution 20 g  20 g Oral Daily PRN Alford Highland, MD       metoprolol tartrate (LOPRESSOR) injection 5 mg  5 mg Intravenous Q4H PRN Cox, Amy N, DO       mirtazapine (REMERON) tablet 15 mg  15 mg Oral QHS Wieting, Richard, MD   15 mg at 10/17/23 2256   Oral care mouth rinse  15 mL Mouth Rinse 4 times per day Alford Highland, MD   15 mL at 10/17/23 2303   Oral care mouth rinse  15 mL Mouth Rinse PRN Alford Highland, MD       polyethylene glycol (MIRALAX / GLYCOLAX) packet 17 g  17 g Oral Daily PRN Sunnie Nielsen, DO       QUEtiapine (SEROQUEL) tablet 12.5 mg  12.5 mg Oral QHS Alford Highland, MD   12.5 mg at 10/17/23 2256     Discharge Medications: Please see discharge summary for a list of discharge medications.  STOP taking these medications     amLODipine 5 MG tablet Commonly known as: NORVASC    LORazepam 0.5 MG tablet Commonly known as: ATIVAN    Myrbetriq 25 MG Tb24 tablet Generic drug: mirabegron ER    nystatin 100000 UNIT/ML suspension Commonly known as: MYCOSTATIN    PRESCRIPTION MEDICATION    traMADol 50 MG tablet Commonly known as: ULTRAM           TAKE these medications     bisacodyl 5 MG EC tablet Commonly known as: DULCOLAX Take 2 tablets (10 mg total) by mouth daily as needed for moderate constipation. What changed: Another  medication with the same name was removed. Continue taking this medication, and follow the directions you see here.    docusate 50 MG/5ML liquid Commonly known as: COLACE Take 10 mLs (100 mg total) by mouth 2 (two) times daily.    feeding supplement Liqd Take 237 mLs by mouth 3 (three) times daily between meals.    food thickener Powd Commonly known as: RESOURCE THICKENUP CLEAR Take 1 Container by mouth as needed.    insulin glargine-yfgn 100 UNIT/ML Pen Commonly known as: SEMGLEE Inject 12 Units into the skin at bedtime. May substitute as needed per insurance.    insulin lispro 100 UNIT/ML KwikPen Commonly known as: HUMALOG Inject 0-11 Units into  the skin 3 (three) times daily with meals. Check Blood Glucose (BG) and inject per scale: BG <150= 0 unit; BG 150-200= 1 unit; BG 201-250= 3 unit; BG 251-300= 5 unit; BG 301-350= 7 unit; BG 351-400= 9 unit; BG >400= 11 unit and Call Primary Care.    melatonin 5 MG Tabs Take 5 mg by mouth at bedtime.    mirtazapine 15 MG disintegrating tablet Commonly known as: REMERON SOL-TAB Take 1 tablet (15 mg total) by mouth at bedtime.    polyethylene glycol 17 g packet Commonly known as: MIRALAX / GLYCOLAX Take 17 g by mouth daily as needed for mild constipation. What changed:  when to take this reasons to take this    QUEtiapine 25 MG tablet Commonly known as: SEROQUEL Take 0.5 tablets (12.5 mg total) by mouth at bedtime. What changed: how much to take    Relevant Imaging Results:  Relevant Lab Results:   Additional Information SS [id number]  Quyen Cutsforth, LCSW

## 2023-10-18 NOTE — Plan of Care (Signed)
PMT shadowing chart as plans are in place for patient to continue treatment until discharge and then return to LTC facility with hospice to follow.  Notes reviewed.  Patient is ready to return to [facility] with [facility] to follow.  PMT will sign off at this time.
